# Patient Record
Sex: Male | Born: 1945 | Race: White | Hispanic: No | Marital: Married | Smoking: Former smoker
Health system: Southern US, Community
[De-identification: ages and names within clinical notes are randomized; demographics above are authoritative.]

## PROBLEM LIST (undated history)

## (undated) DIAGNOSIS — I4430 Unspecified atrioventricular block: Secondary | ICD-10-CM

## (undated) DIAGNOSIS — Z974 Presence of external hearing-aid: Secondary | ICD-10-CM

## (undated) DIAGNOSIS — M1990 Unspecified osteoarthritis, unspecified site: Secondary | ICD-10-CM

## (undated) DIAGNOSIS — H269 Unspecified cataract: Secondary | ICD-10-CM

## (undated) DIAGNOSIS — R0902 Hypoxemia: Secondary | ICD-10-CM

## (undated) DIAGNOSIS — I509 Heart failure, unspecified: Secondary | ICD-10-CM

## (undated) DIAGNOSIS — M109 Gout, unspecified: Secondary | ICD-10-CM

## (undated) DIAGNOSIS — I495 Sick sinus syndrome: Secondary | ICD-10-CM

## (undated) DIAGNOSIS — T7840XA Allergy, unspecified, initial encounter: Secondary | ICD-10-CM

## (undated) DIAGNOSIS — D689 Coagulation defect, unspecified: Secondary | ICD-10-CM

## (undated) DIAGNOSIS — I480 Paroxysmal atrial fibrillation: Secondary | ICD-10-CM

## (undated) DIAGNOSIS — Z950 Presence of cardiac pacemaker: Secondary | ICD-10-CM

## (undated) DIAGNOSIS — G4730 Sleep apnea, unspecified: Secondary | ICD-10-CM

## (undated) HISTORY — DX: Coagulation defect, unspecified: D68.9

## (undated) HISTORY — PX: SHOULDER ARTHROSCOPY: SHX128

## (undated) HISTORY — DX: Paroxysmal atrial fibrillation: I48.0

## (undated) HISTORY — DX: Unspecified atrioventricular block: I44.30

## (undated) HISTORY — DX: Heart failure, unspecified: I50.9

## (undated) HISTORY — DX: Hypoxemia: R09.02

## (undated) HISTORY — DX: Allergy, unspecified, initial encounter: T78.40XA

## (undated) HISTORY — DX: Unspecified osteoarthritis, unspecified site: M19.90

## (undated) HISTORY — DX: Presence of cardiac pacemaker: Z95.0

## (undated) HISTORY — PX: INSERT / REPLACE / REMOVE PACEMAKER: SUR710

## (undated) HISTORY — PX: EYE SURGERY: SHX253

## (undated) HISTORY — DX: Unspecified cataract: H26.9

## (undated) HISTORY — DX: Sick sinus syndrome: I49.5

---

## 1994-09-01 HISTORY — PX: ROTATOR CUFF REPAIR: SHX139

## 1994-10-13 HISTORY — PX: CARDIAC CATHETERIZATION: SHX172

## 2002-05-11 ENCOUNTER — Ambulatory Visit (HOSPITAL_COMMUNITY): Admission: RE | Admit: 2002-05-11 | Discharge: 2002-05-11 | Payer: Self-pay | Admitting: Cardiology

## 2002-05-11 ENCOUNTER — Encounter: Payer: Self-pay | Admitting: Cardiology

## 2003-05-04 ENCOUNTER — Encounter: Payer: Self-pay | Admitting: Cardiology

## 2003-05-04 ENCOUNTER — Encounter: Admission: RE | Admit: 2003-05-04 | Discharge: 2003-05-04 | Payer: Self-pay | Admitting: Cardiology

## 2003-05-10 ENCOUNTER — Ambulatory Visit (HOSPITAL_COMMUNITY): Admission: RE | Admit: 2003-05-10 | Discharge: 2003-05-10 | Payer: Self-pay | Admitting: Cardiology

## 2003-05-10 HISTORY — PX: CARDIAC CATHETERIZATION: SHX172

## 2003-06-26 ENCOUNTER — Ambulatory Visit (HOSPITAL_COMMUNITY): Admission: RE | Admit: 2003-06-26 | Discharge: 2003-06-27 | Payer: Self-pay | Admitting: *Deleted

## 2003-06-26 ENCOUNTER — Encounter: Payer: Self-pay | Admitting: *Deleted

## 2008-10-11 ENCOUNTER — Encounter: Admission: RE | Admit: 2008-10-11 | Discharge: 2008-10-11 | Payer: Self-pay | Admitting: Orthopedic Surgery

## 2010-03-12 ENCOUNTER — Ambulatory Visit (HOSPITAL_COMMUNITY): Admission: RE | Admit: 2010-03-12 | Discharge: 2010-03-12 | Payer: Self-pay | Admitting: Cardiology

## 2010-03-12 DIAGNOSIS — Z950 Presence of cardiac pacemaker: Secondary | ICD-10-CM

## 2010-03-12 HISTORY — PX: PERMANENT PACEMAKER INSERTION: SHX6023

## 2010-03-12 HISTORY — DX: Presence of cardiac pacemaker: Z95.0

## 2010-04-08 ENCOUNTER — Encounter (INDEPENDENT_AMBULATORY_CARE_PROVIDER_SITE_OTHER): Payer: Self-pay | Admitting: Urology

## 2010-04-08 ENCOUNTER — Observation Stay (HOSPITAL_COMMUNITY): Admission: RE | Admit: 2010-04-08 | Discharge: 2010-04-09 | Payer: Self-pay | Admitting: Urology

## 2010-11-15 LAB — SURGICAL PCR SCREEN
MRSA, PCR: NEGATIVE
Staphylococcus aureus: NEGATIVE

## 2010-11-15 LAB — BASIC METABOLIC PANEL
BUN: 11 mg/dL (ref 6–23)
CO2: 31 mEq/L (ref 19–32)
Calcium: 9.3 mg/dL (ref 8.4–10.5)
Chloride: 100 mEq/L (ref 96–112)
Creatinine, Ser: 0.87 mg/dL (ref 0.4–1.5)
GFR calc Af Amer: >60 mL/min (ref >60)
GFR calc non Af Amer: >60 mL/min (ref >60)
Glucose, Bld: 102 mg/dL — ABNORMAL HIGH (ref 70–99)
Potassium: 4.2 mEq/L (ref 3.5–5.1)
Sodium: 138 mEq/L (ref 135–145)

## 2010-11-15 LAB — PROTIME-INR
INR: 1.03 (ref 0.00–1.49)
INR: 2.12 — ABNORMAL HIGH (ref 0.00–1.49)
Prothrombin Time: 13.7 seconds (ref 11.6–15.2)
Prothrombin Time: 23.6 seconds — ABNORMAL HIGH (ref 11.6–15.2)

## 2010-11-15 LAB — APTT
aPTT: 26 seconds (ref 24–37)
aPTT: 34 seconds (ref 24–37)

## 2010-11-15 LAB — GLUCOSE, CAPILLARY: Glucose-Capillary: 121 mg/dL — ABNORMAL HIGH (ref 70–99)

## 2010-11-17 LAB — CBC
HCT: 42.1 % (ref 39.0–52.0)
Hemoglobin: 14.6 g/dL (ref 13.0–17.0)
MCH: 32.3 pg (ref 26.0–34.0)
MCHC: 34.6 g/dL (ref 30.0–36.0)
MCV: 93.2 fL (ref 78.0–100.0)
Platelets: 222 10*3/uL (ref 150–400)
RBC: 4.52 MIL/uL (ref 4.22–5.81)
RDW: 13.3 % (ref 11.5–15.5)
WBC: 6.1 10*3/uL (ref 4.0–10.5)

## 2010-11-17 LAB — BASIC METABOLIC PANEL
BUN: 12 mg/dL (ref 6–23)
CO2: 26 mEq/L (ref 19–32)
Calcium: 9 mg/dL (ref 8.4–10.5)
Chloride: 104 mEq/L (ref 96–112)
Creatinine, Ser: 0.83 mg/dL (ref 0.4–1.5)
GFR calc Af Amer: >60 mL/min (ref >60)
GFR calc non Af Amer: >60 mL/min (ref >60)
Glucose, Bld: 100 mg/dL — ABNORMAL HIGH (ref 70–99)
Potassium: 3.9 mEq/L (ref 3.5–5.1)
Sodium: 139 mEq/L (ref 135–145)

## 2010-11-17 LAB — SURGICAL PCR SCREEN
MRSA, PCR: NEGATIVE
Staphylococcus aureus: NEGATIVE

## 2010-11-17 LAB — APTT: aPTT: 28 seconds (ref 24–37)

## 2010-11-17 LAB — PROTIME-INR
INR: 1.49 (ref 0.00–1.49)
Prothrombin Time: 17.9 seconds — ABNORMAL HIGH (ref 11.6–15.2)

## 2010-11-25 LAB — BASIC METABOLIC PANEL
BUN: 11 mg/dL (ref 6–23)
CO2: 32 mEq/L (ref 19–32)
Calcium: 9.5 mg/dL (ref 8.4–10.5)
Chloride: 99 mEq/L (ref 96–112)
Creatinine, Ser: 0.92 mg/dL (ref 0.4–1.5)
GFR calc Af Amer: >60 mL/min (ref >60)
GFR calc non Af Amer: >60 mL/min (ref >60)
Glucose, Bld: 94 mg/dL (ref 70–99)
Potassium: 3.9 mEq/L (ref 3.5–5.1)
Sodium: 138 mEq/L (ref 135–145)

## 2010-11-25 LAB — PROTIME-INR
INR: 1.14 (ref 0.00–1.49)
Prothrombin Time: 14.8 seconds (ref 11.6–15.2)

## 2010-11-28 ENCOUNTER — Ambulatory Visit (HOSPITAL_BASED_OUTPATIENT_CLINIC_OR_DEPARTMENT_OTHER)
Admission: RE | Admit: 2010-11-28 | Discharge: 2010-11-28 | Disposition: A | Discharge status: Home / Self Care | Payer: BC Managed Care – PPO | Source: Ambulatory Visit | Attending: Urology | Admitting: Urology

## 2010-11-28 DIAGNOSIS — Z01812 Encounter for preprocedural laboratory examination: Secondary | ICD-10-CM | POA: Insufficient documentation

## 2010-11-28 DIAGNOSIS — I4891 Unspecified atrial fibrillation: Secondary | ICD-10-CM | POA: Insufficient documentation

## 2010-11-28 DIAGNOSIS — Z7901 Long term (current) use of anticoagulants: Secondary | ICD-10-CM | POA: Insufficient documentation

## 2010-11-28 DIAGNOSIS — N471 Phimosis: Secondary | ICD-10-CM | POA: Insufficient documentation

## 2010-11-28 LAB — POCT HEMOGLOBIN-HEMACUE: Hemoglobin: 14.4 g/dL (ref 13.0–17.0)

## 2010-12-13 NOTE — Op Note (Signed)
  NAME:  Shane [name], Shane [name]               ACCOUNT NO.:  [id number]  MEDICAL RECORD NO.:  [id number]          PATIENT TYPE:  LOCATION:                                 FACILITY:  PHYSICIAN:  Lyle Leisner I. Patsi Sears, M.D. DATE OF BIRTH:  DATE OF PROCEDURE: DATE OF DISCHARGE:                              OPERATIVE REPORT   PREOPERATIVE DIAGNOSIS:  Chronic phimosis.  POSTOPERATIVE DIAGNOSIS:  Chronic phimosis.  OPERATION:  Circumcision.  SURGEON:  Delphina Schum I. Patsi Sears, MD  ANESTHESIA:  General LMA.  PREPARATION:  After appropriate preanesthesia, the patient is brought to the operating room, placed on the operating room in dorsal supine position where general LMA anesthesia was introduced.  He remained in the supine position where the penis was shaved, prepped with Betadine solution, and draped in usual fashion.  BRIEF HISTORY:  The patient is a 65 year old male, with inability to retract the foreskin because of severe phimosis.  Note, he has a past history of A-fib and treated with Coumadin and pacemaker insertion in 2004, and TURP with pathology showing chronic inflammatory process with caseating necrosis, but negative AFB and fungal evaluation.  He is now for circumcision.  PROCEDURE:  Outline of the foreskin was accomplished with a marking pen, and the circumcising incision was made around both the glans of the penis and the corona of the penis.  The foreskin was removed.  It is noted that the patient needed to begin with retrograde incision of the foreskin, because I was unable to retract the foreskin over the glans. There was a large amount of smegma, which required Betadine wash during the procedure.  This was trapped under the foreskin.  Following this, I saw no other evidence of disease, and the foreskin was removed.  The wound was then closed by dividing it into 4 quadrants, and each quadrant closed with interrupted 4-0 Monocryl suture.  The patient tolerated the procedure  well.  A 10 cc of Marcaine 2% was injected into the base of the penis to afford a penile block.  The patient is given IV Toradol prior to awakening.  He was awakened and taken to recovery room in good condition.     Kameryn Tisdel I. Patsi Sears, M.D.    SIT/MEDQ  D:  11/28/2010  T:  11/28/2010  Job:  [id number]  Electronically Signed by Jethro Bolus M.D. on 12/13/2010 05:49:43 PM

## 2011-01-17 NOTE — Discharge Summary (Signed)
NAME:  Shane [name], Shane [name]                         ACCOUNT NO.:  [id number]   MEDICAL RECORD NO.:  [id number]                   PATIENT TYPE:  OIB   LOCATION:  6532                                 FACILITY:  [facility]   PHYSICIAN:  Darlin Priestly, M.D.             DATE OF BIRTH:  [birth year]   DATE OF ADMISSION:  06/26/2003  DATE OF DISCHARGE:  06/27/2003                                 DISCHARGE SUMMARY   ADMISSION DIAGNOSES:  1. 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  No evidence of outflow obstruction, ejection fraction 64%.  2. Mild pulmonary regurgitation and trivial mitral regurgitation.  3. Symptomatic bradycardia.   DISCHARGE DIAGNOSES:  1. 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  No evidence of outflow obstruction, ejection fraction 64%.  2. Mild pulmonary regurgitation and trivial mitral regurgitation.  3. Symptomatic bradycardia.   PROCEDURES:  Implantation of Guidant Insignia 1-Plus DR, model #006, serial  number [id number], June 26, 2003, Dr. Darlin Priestly.   BRIEF HISTORY:  The patient is a 65 year old white male who presented to Dr.  Gaspar Garbe B. [name] with chest discomfort.  He was found to have asymmetrical  septal hypertrophy.  He returned having some occasional shortness of breath  and chest discomfort and underwent cardiac catheterization.  This revealed  no evidence of coronary artery disease but he did have a hyperdynamic left  ventricular function with left ventricular hypertrophy.  There was no  subvalvular gradient noted and no obstruction.  He also underwent 2-D  echocardiogram which revealed mild pulmonary regurgitation and mild mitral  regurgitation.  He was somewhat bradycardic and he was taken off Cardizem  and placed on Norvasc.  He continued to be symptomatic and the Norvasc was  also discontinued.  He remained symptomatic and was referred to Dr. Lenise Herald.   It was his opinion the patient had symptomatic bradycardia off all  his negative chronotropic agents and he recommended permanent transvenous  pacemaker and he was admitted for implantation device at this time.   ALLERGIES:  None known.   MEDICATIONS:  1. He is on an aspirin 81 mg daily.  2. He is on a multivitamin and vitamin E and a baby aspirin daily.  3. He is also taking Allegra and Ativan on an as-needed (p.r.n.) basis.   HOSPITAL COURSE:  The patient was admitted and taken to the catheterization  lab and underwent implantation of the Guidant pacemaker.  He tolerated the  procedure well and returned to the floor.  He has done well.  We were called  last night because the nursing staff noted what they thought was an elevated  heart rate.  Evaluation of the strip shows he is pacing properly and sensing  properly, but the computer was miscounting the heart rate.  The patient has  done well.  He has a chest x-ray  that has been performed this morning.  He  was seen by Dr. Jenne Campus and it was Dr. Mikey Bussing opinion he was doing well  and could be discharged home.  He will return in one week to see Dr.  Jenne Campus.   DISCHARGE MEDICATIONS:  1. He will resume his:     a. Preadmission aspirin 81 mg daily.     b. Vitamins daily.     c. His p.r.n. Allegra and Ativan.  2. He was placed on Cardizem 180 mg daily.   DISCHARGE ACTIVITY:  Discharge activity light, no lifting over 10 pounds, no  lifting overhead.   DIET:  He is to maintain a low-salt, low-cholesterol diet.   FOLLOWUP:  He will follow up with Dr. Jenne Campus on July 06, 2003.      Eber Hong, P.A.                 Darlin Priestly, M.D.    WDJ/MEDQ  D:  06/27/2003  T:  06/27/2003  Job:  [id number]   cc:   Darlin Priestly, M.D.  [address], [address]  [place]  [place] [address]  Fax: [phone]   Thereasa Solo. [name], M.D.  [address]  [place] [address]  Fax: [phone]   Dewaine Oats  [address]  [place]  [place] [address]  Fax: [phone]

## 2011-01-17 NOTE — Op Note (Signed)
NAME:  Shane [name], ZINDA NO.:  [id number]   MEDICAL RECORD NO.:  [id number]                   PATIENT TYPE:  OIB   LOCATION:  6532                                 FACILITY:  [facility]   PHYSICIAN:  Darlin Priestly, M.D.             DATE OF BIRTH:  1946-03-19   DATE OF PROCEDURE:  06/26/2003  DATE OF DISCHARGE:                                 OPERATIVE REPORT   PROCEDURES PERFORMED:  Insertion of a dual chamber pacemaker using a Guidant  Insignia 1+DR with passive atrial and ventricular leads.   ATTENDING:  Darlin Priestly, M.D.   COMPLICATIONS:  None.   INDICATIONS:  Shane [name] is a 65 year old male patient of Shane [name],  M.D. and Shane [name], M.D. in [place] with a history of hypertrophic  obstructive cardiomyopathy, history of significant bradycardia which has  been symptomatic with rates in the 40s despite being off all negative  chronotropic agents.  Because of his need for negative chronotropic agents  to treat his HOCM and his symptomatic bradycardia, he is now referred for  dual chamber pacemaker.   DESCRIPTION OF OPERATION:  After giving informed written consent, patient  brought to the cardiac catheterization laboratory where left chest wall was  shaved, prepped and draped in usual sterile fashion.  ECG monitor  established.  1% lidocaine was then used to anesthetize the left subclavian  region.  Approximately 3 cm horizontal incision was then made beneath the  left clavicle and blunt dissection was then used to carry this into the left  pectoral fascia.  A 3 x 4 cm pocket was then created over the left pectoral  fascia and hemostasis was obtained with electrocautery.  Next, the left  subclavian vein was then entered and a guidewire was then easily passed into  the [facility] and right atrium.  Next, a #9-French dilator and sheath were then  easily passed over the guidewire and the dilator and guide were removed.  Following this a Guidant  passive ventricular lead model #4457 58 cm serial  [id number] was then easily passed into the right atrium and the retained  guidewire was then reinserted through the sheath.  Peel-away sheaths were  removed.  A second 9-French dilator and sheath were then tracked over the  retained guidewire and the guidewire and dilator removed.  A second Guidant  atrial lead model #4480 52 cm passive lead serial #[id number] was then  easily passed into the [facility] and right atrium and the guidewire was again  retained.  Peel-away sheath was removed.  The guidewire was then __________  with a mosquito clamp.  A J curve was then placed on the ventricular stylet.  Ventricular lead was then easily passed across the tricuspid valve.  The  lead was then positioned in the RV apex and thresholds were determined.  R-  waves were measured at 8.8 millivolts.  Threshold in the ventricle was  0.5  volts at 0.5 milliseconds.  Impendence was 412 ohms with a current of 1.6  milliamps.  10 volts was negative for diaphragmatic stimulation.  The atrial  lead was then allowed to form in the right atrial appendage and thresholds  were again determined.  P-waves were sensed at 6.1 millivolts.  A threshold  in the atrium was 0.3 volts at 0.5 milliseconds.  Current was 1.8 milliamps.  Impendence was 378 ohms.  Both these leads were then sutured into place with  two silk sutures per lead.  The pocket was then copiously irrigated with 1%  kanamycin solution.  The leads were then connected in a serial fashion to a  Guidant Insignia 1 plus DR model [id number] serial [id number] and the head screws  were tightened and pacing was confirmed.  A single silk suture was then  placed in the apex of the pocket and __________ and the leads were then  delivered into the pocket without difficulty.  The generator was then  secured with a silk suture.  The subcutaneous layer was then closed using  running 2-0 Dexon.  The skin layer was closed using running  5-0 Dexon.  Steri-Strips were applied.  The patient transferred to recovery room in  stable condition.   CONCLUSION:  Successful placement of a Guidant Insignia 1 plus DR model  [id number] generator with passive atrial and ventricular leads.                                               Darlin Priestly, M.D.    RHM/MEDQ  D:  06/26/2003  T:  06/26/2003  Job:  [id number]   cc:   Shane [name], M.D.  [address]  [place] [address]  Fax: [phone]   Shane [name]  [address]  [place]  [place] [address]  Fax: [phone]

## 2011-01-17 NOTE — Cardiovascular Report (Signed)
NAME:  Shane [name], Shane [name]                         ACCOUNT NO.:  [id number]   MEDICAL RECORD NO.:  [id number]                   PATIENT TYPE:  OIB   LOCATION:  2899                                 FACILITY:  [facility]   PHYSICIAN:  Thereasa Solo. Little, M.D.              DATE OF BIRTH:  20-Aug-1946   DATE OF PROCEDURE:  05/10/2003  DATE OF DISCHARGE:                              CARDIAC CATHETERIZATION   INDICATIONS FOR TEST:  Mr. 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  He has been relatively asymptomatic until about two  months ago when he began having increasing episodes of tiredness and fatigue  and some exertional breathlessness.  His EKG shows more diffuse ST-T wave  abnormalities and mild bradycardia.  His bradycardia was thought to be  related to his Cardizem and he was switched to Norvasc.  He is brought in  for outpatient cardiac catheterization because of the abnormal EKG and  breathlessness.   PROCEDURE:  The patient was prepped and draped in the usual sterile fashion  exposing the right groin.  Following local anesthetic with 1% Xylocaine the  Seldinger technique was employed and a 5-French introducer sheath was placed  into the right femoral artery.  Left and right coronary arteriography was  performed and ventriculography in the RAO and LAO projection was performed.   The right coronary catheter was placed at the apex of the right ventricle  and hemodynamic monitoring was performed.  It was then pulled back into the  subvalvular area and then across the valve.  There was no gradient from the  apex to the subvalvular to the ascending aorta.   RESULTS:  HEMODYNAMIC MONITORING:  Central aortic pressure was 120/56.  Left ventricular pressure was 120/13.   VENTRICULOGRAPHY:  Ventriculography in both the RAO and LAO projection  revealed hyperdynamic LV function.  There was a large papillary muscle in  the mid portion of the left ventricular cavity clearly  constricted more than  the other segments but there was not obliteration of the cavity.  Ejection  fraction was greater than 70%.  No mitral regurgitation was seen.  End-  diastolic pressure was 15.   CORONARY ARTERIOGRAPHY:  On fluoroscopy no calcification was seen.  1. Left main was short, almost like a common ostium for both the LAD and     circumflex.  2. LAD extended down to the apex and gave rise to two high diagonal     branches.  This system was free of disease.  3. Circumflex had one large high first OM and the second OM was a little bit     smaller, but still medium in size.  The ongoing circumflex and the OM     vessels were free of disease.  4. Right coronary artery gave rise to a small PDA and a small posterolateral     branch, all of which were free  of disease.   CONCLUSION:  1. No evidence of coronary artery disease.  2.     Hyperdynamic left ventricular function with left ventricular hypertrophy.     There is no subvalvular gradient noted.   DISPOSITION:  The patient will be discharged to home with follow-up in my  office on May 11, 2003.                                               Thereasa Solo. Little, M.D.    ABL/MEDQ  D:  05/10/2003  T:  05/10/2003  Job:  [id number]   cc:   Arlana Pouch, M.D.  [place], [place]   Cath Lab

## 2011-05-07 HISTORY — PX: US ECHOCARDIOGRAPHY: HXRAD669

## 2011-05-07 HISTORY — PX: NM MYOCAR PERF WALL MOTION: HXRAD629

## 2013-01-13 ENCOUNTER — Telehealth: Payer: Self-pay | Admitting: Cardiovascular Disease

## 2013-01-17 NOTE — Telephone Encounter (Signed)
Shane [name] was returning your call from 01/13/13. Patient states he no longer needs an appointment. He is feeling much better.

## 2013-02-18 ENCOUNTER — Other Ambulatory Visit: Payer: Self-pay | Admitting: Pharmacist Clinician (PhC)/ Clinical Pharmacy Specialist

## 2013-02-18 MED ORDER — FUROSEMIDE 40 MG PO TABS: 40.0000 mg | ORAL_TABLET | Freq: Every day | ORAL | Status: DC

## 2013-03-17 ENCOUNTER — Other Ambulatory Visit: Payer: Self-pay

## 2013-03-17 MED ORDER — FUROSEMIDE 40 MG PO TABS: 40.0000 mg | ORAL_TABLET | Freq: Every day | ORAL | Status: DC

## 2013-03-17 NOTE — Addendum Note (Signed)
Addended by: Neta Ehlers on: 03/17/2013 05:33 PM   Modules accepted: Orders

## 2013-03-17 NOTE — Telephone Encounter (Signed)
Rx was sent to pharmacy electronically. 

## 2013-03-22 ENCOUNTER — Encounter: Payer: Self-pay | Admitting: *Deleted

## 2013-03-31 ENCOUNTER — Encounter: Payer: Self-pay | Admitting: *Deleted

## 2013-04-01 ENCOUNTER — Other Ambulatory Visit: Payer: Self-pay | Admitting: *Deleted

## 2013-04-01 MED ORDER — APIXABAN 5 MG PO TABS: 5.0000 mg | ORAL_TABLET | Freq: Two times a day (BID) | ORAL | Status: DC

## 2013-04-04 ENCOUNTER — Encounter: Payer: Self-pay | Admitting: Cardiovascular Disease

## 2013-04-05 ENCOUNTER — Encounter: Payer: Self-pay | Admitting: Cardiovascular Disease

## 2013-04-05 ENCOUNTER — Ambulatory Visit (INDEPENDENT_AMBULATORY_CARE_PROVIDER_SITE_OTHER): Payer: BC Managed Care – PPO | Admitting: Cardiovascular Disease

## 2013-04-05 VITALS — BP 136/78 | HR 60 | Resp 16 | Ht 71.0 in | Wt 284.6 lb

## 2013-04-05 DIAGNOSIS — I5032 Chronic diastolic (congestive) heart failure: Secondary | ICD-10-CM

## 2013-04-05 DIAGNOSIS — I4819 Other persistent atrial fibrillation: Secondary | ICD-10-CM | POA: Insufficient documentation

## 2013-04-05 DIAGNOSIS — E669 Obesity, unspecified: Secondary | ICD-10-CM

## 2013-04-05 DIAGNOSIS — I4891 Unspecified atrial fibrillation: Secondary | ICD-10-CM | POA: Insufficient documentation

## 2013-04-05 DIAGNOSIS — I5042 Chronic combined systolic (congestive) and diastolic (congestive) heart failure: Secondary | ICD-10-CM | POA: Insufficient documentation

## 2013-04-05 DIAGNOSIS — I509 Heart failure, unspecified: Secondary | ICD-10-CM

## 2013-04-05 DIAGNOSIS — Z950 Presence of cardiac pacemaker: Secondary | ICD-10-CM | POA: Insufficient documentation

## 2013-04-05 NOTE — Assessment & Plan Note (Signed)
Episodes of atrial fibrillation are completely asymptomatic and well rate controlled. He is on appropriate anticoagulation therapy. We discussed appropriate protocols for anticoagulant interruption for surgical procedures. He has not had any bleeding complications. Overall burden is about 3% which is comparable to historical control.

## 2013-04-05 NOTE — Assessment & Plan Note (Signed)
[employer] Altrua 40, current generator implanted July 2011, initial device implant October 2004. Normal device function. Underlying rhythm is sinus bradycardia at 45 beats per minute. Presenting rhythm is atrial ventricular sequential pacing. Battery voltage shows approximate 75% longevity, greater than 5 years. Atrial lead impedance 490 ohms, P waves 3.6 mV, threshold 0.5 V at 0.4 ms. Right ventricular lead impedance 520 ohms, R waves 10.5 mV, threshold 0.7 V at 0.4 ms. 70% atrial pacing, 75% ventricular pacing. No device setting changes were made. Thresholds were actively tested. Oral atrial fibrillation burden is 3%, greater than 900 mode switches since February.

## 2013-04-05 NOTE — Assessment & Plan Note (Signed)
No complaints of shortness of breath or lower showed edema. Appears to currently be euvolemic on a low dose of daily loop diuretic. Left ventricular ejection fraction was 55-60% by echocardiogram performed in September of 2012. Lexiscan Myoview performed at the same time was also normal.

## 2013-04-05 NOTE — Progress Notes (Signed)
Patient ID: Shane [name], male   DOB: 07-10-1946, 67 y.o.   MRN: [id number]     Reason for office visit Atrial fibrillation, sinus dysfunction, pacemaker followup  Since his last appointment Shane [name] has not had any problems with palpitations, syncope, dizziness or lightheadedness, shortness of breath or chest pain. Interrogation of his pacemaker shows that he has had numerous episodes of atrial fibrillation, but he is completely unaware of these. He has not had any bleeding problems, even though he had skin biopsies performed without interruption of chronic anticoagulation with Eliquis. He remains morbidly obese and has been virtually no progress with weight loss.  No Known Allergies  Current Outpatient Prescriptions  Medication Sig Dispense Refill  . apixaban (ELIQUIS) 5 MG TABS tablet Take 1 tablet (5 mg total) by mouth 2 (two) times daily.  60 tablet  6  . Ascorbic Acid (VITAMIN C) 1000 MG tablet Take 1,000 mg by mouth daily.      . B Complex-C-Folic Acid (STRESS FORMULA) TABS Take by mouth daily.      . Calcium Citrate (CITRACAL PO) Take by mouth daily.      . citalopram (CELEXA) 40 MG tablet Take 40 mg by mouth daily.      Marland Kitchen diltiazem (TIAZAC) 360 MG 24 hr capsule Take 360 mg by mouth daily.      Marland Kitchen doxazosin (CARDURA) 4 MG tablet Take 4 mg by mouth at bedtime.      . fish oil-omega-3 fatty acids 1000 MG capsule Take 2 g by mouth daily.      . furosemide (LASIX) 40 MG tablet Take 1 tablet (40 mg total) by mouth daily.  30 tablet  7  . glucosamine-chondroitin 500-400 MG tablet Take 1 tablet by mouth daily.      . montelukast (SINGULAIR) 10 MG tablet Take 10 mg by mouth at bedtime.      Marland Kitchen testosterone (ANDROGEL) 50 MG/5GM GEL Place 5 g onto the skin daily.      . vitamin B-12 (CYANOCOBALAMIN) 1000 MCG tablet Take 1,000 mcg by mouth daily.       No current facility-administered medications for this visit.    Past Medical History  Diagnosis Date  . Sinus node dysfunction   .  Presence of permanent cardiac pacemaker 03/12/10    guidant  . Paroxysmal atrial fibrillation   . Atrioventricular block   . Degenerative joint disease     Past Surgical History  Procedure Laterality Date  . Rotator cuff repair  1996  . Permanent pacemaker insertion  03/12/2010    guidant  . Cardiac catheterization  10/13/1994    No CAD  . Cardiac catheterization  05/10/2003    No CAD  . US echocardiography  05/07/2011    mod. LVH,LA mod. dilated,borderline aortic root dilatation  . Nm myocar perf wall motion  05/07/2011    Lexiscan: No ishcemia    Family History  Problem Relation Age of Onset  . Stroke Mother   . Heart failure Mother     History   Social History  . Marital Status: Married    Spouse Name: N/A    Number of Children: N/A  . Years of Education: N/A   Occupational History  . Not on file.   Social History Main Topics  . Smoking status: Former Smoker    Types: Cigars  . Smokeless tobacco: Not on file  . Alcohol Use: Yes     Comment: social  . Drug Use: No  .  Sexually Active: Not on file   Other Topics Concern  . Not on file   Social History Narrative  . No narrative on file    Review of systems: The patient specifically denies any chest pain at rest or with exertion, dyspnea at rest or with exertion, orthopnea, paroxysmal nocturnal dyspnea, syncope, palpitations, focal neurological deficits, intermittent claudication, lower extremity edema, unexplained weight gain, cough, hemoptysis or wheezing.  The patient also denies abdominal pain, nausea, vomiting, dysphagia, diarrhea, constipation, polyuria, polydipsia, dysuria, hematuria, frequency, urgency, abnormal bleeding or bruising, fever, chills, unexpected weight changes, mood swings, change in skin or hair texture, change in voice quality, auditory or visual problems, allergic reactions or rashes, new musculoskeletal complaints other than usual "aches and pains".   PHYSICAL EXAM BP 136/78  Pulse 60   Resp 16  Ht 5\' 11"  (1.803 m)  Wt 284 lb 9.6 oz (129.094 kg)  BMI 39.71 kg/m2  General: Alert, oriented x3, no distress, severely obese Head: no evidence of trauma, PERRL, EOMI, no exophtalmos or lid lag, no myxedema, no xanthelasma; normal ears, nose and oropharynx Neck: normal jugular venous pulsations and no hepatojugular reflux; brisk carotid pulses without delay and no carotid bruits Chest: clear to auscultation, no signs of consolidation by percussion or palpation, normal fremitus, symmetrical and full respiratory excursions, healthy left subclavian pacemaker site Cardiovascular: normal position and quality of the apical impulse, regular rhythm, normal first and second heart sounds, no murmurs, rubs or gallops Abdomen: no tenderness or distention, no masses by palpation, no abnormal pulsatility or arterial bruits, normal bowel sounds, no hepatosplenomegaly Extremities: no clubbing, cyanosis or edema; 2+ radial, ulnar and brachial pulses bilaterally; 2+ right femoral, posterior tibial and dorsalis pedis pulses; 2+ left femoral, posterior tibial and dorsalis pedis pulses; no subclavian or femoral bruits Neurological: grossly nonfocal   EKG: Atrial paced, ventricular sensed, nonspecific intraventricular conduction delay  Lipid Panel  No results found for this basename: chol, trig, hdl, cholhdl, vldl, ldlcalc    BMET    Component Value Date/Time   NA 138 11/25/2010 1703   K 3.9 11/25/2010 1703   CL 99 11/25/2010 1703   CO2 32 11/25/2010 1703   GLUCOSE 94 11/25/2010 1703   BUN 11 11/25/2010 1703   CREATININE 0.92 11/25/2010 1703   CALCIUM 9.5 11/25/2010 1703   GFRNONAA >60 11/25/2010 1703   GFRAA  Value: >60        The eGFR has been calculated using the MDRD equation. This calculation has not been validated in all clinical situations. eGFR's persistently <60 mL/min signify possible Chronic Kidney Disease. 11/25/2010 1703     ASSESSMENT AND PLAN Atrial fibrillation Episodes of atrial  fibrillation are completely asymptomatic and well rate controlled. He is on appropriate anticoagulation therapy. We discussed appropriate protocols for anticoagulant interruption for surgical procedures. He has not had any bleeding complications. Overall burden is about 3% which is comparable to historical control.  Chronic diastolic CHF (congestive heart failure) No complaints of shortness of breath or lower showed edema. Appears to currently be euvolemic on a low dose of daily loop diuretic. Left ventricular ejection fraction was 55-60% by echocardiogram performed in September of 2012. Lexiscan Myoview performed at the same time was also normal.  Obesity (BMI 35.0-39.9 without comorbidity) Unfortunately he has not made any progress with weight loss and continues to be almost morbidly obese  Pacemaker for sinus node dysfunction [employer] 40, current generator implanted July 2011, initial device implant October 2004. Normal device function. Underlying  rhythm is sinus bradycardia at 45 beats per minute. Presenting rhythm is atrial ventricular sequential pacing. Battery voltage shows approximate 75% longevity, greater than 5 years. Atrial lead impedance 490 ohms, P waves 3.6 mV, threshold 0.5 V at 0.4 ms. Right ventricular lead impedance 520 ohms, R waves 10.5 mV, threshold 0.7 V at 0.4 ms. 70% atrial pacing, 75% ventricular pacing. No device setting changes were made. Thresholds were actively tested. Oral atrial fibrillation burden is 3%, greater than 900 mode switches since February.  Orders Placed This Encounter  Procedures  . EKG 12-Lead   Meds ordered this encounter  Medications  . testosterone (ANDROGEL) 50 MG/5GM GEL    Sig: Place 5 g onto the skin daily.    Junious Silk, MD, [facility] [phone] office [phone] pager

## 2013-04-05 NOTE — Patient Instructions (Addendum)
Your physician recommends that you schedule a follow-up appointment in: 3 months for pacemaker check

## 2013-04-05 NOTE — Assessment & Plan Note (Signed)
Unfortunately he has not made any progress with weight loss and continues to be almost morbidly obese

## 2013-06-01 ENCOUNTER — Other Ambulatory Visit: Payer: Self-pay | Admitting: *Deleted

## 2013-06-01 MED ORDER — DILTIAZEM HCL ER BEADS 360 MG PO CP24: 360.0000 mg | ORAL_CAPSULE | Freq: Every day | ORAL | Status: DC

## 2013-07-11 ENCOUNTER — Other Ambulatory Visit: Payer: Self-pay

## 2013-07-11 MED ORDER — CITALOPRAM HYDROBROMIDE 40 MG PO TABS: ORAL_TABLET | ORAL | Status: DC

## 2013-07-11 NOTE — Telephone Encounter (Signed)
Rx sent in to pharmacy electronically. Authorized #[id number] w/ 0 refills and note to pharmacy - defer to PCP for future refills. Per Dr Royann Shivers.

## 2013-07-11 NOTE — Addendum Note (Signed)
Addended by: Neta Ehlers on: 07/11/2013 03:56 PM   Modules accepted: Orders

## 2013-07-11 NOTE — Telephone Encounter (Signed)
Please advise 

## 2013-07-11 NOTE — Telephone Encounter (Signed)
Please refer to PCP. OK to fill for 30 days only if he has trouble getting through to PCP>

## 2013-08-30 ENCOUNTER — Ambulatory Visit (INDEPENDENT_AMBULATORY_CARE_PROVIDER_SITE_OTHER): Payer: BC Managed Care – PPO | Admitting: *Deleted

## 2013-08-30 DIAGNOSIS — I4891 Unspecified atrial fibrillation: Secondary | ICD-10-CM

## 2013-08-30 LAB — PACEMAKER DEVICE OBSERVATION

## 2013-08-30 NOTE — Patient Instructions (Signed)
Your physician recommends that you schedule a follow-up appointment on 11-28-2013  @ 11:00 with the device clinic on [address]

## 2013-09-07 LAB — MDC_IDC_ENUM_SESS_TYPE_INCLINIC
Brady Statistic RA Percent Paced: 81 %
Brady Statistic RV Percent Paced: 78 %
Date Time Interrogation Session: 20141230050000
Implantable Pulse Generator Serial Number: 598266
Lead Channel Impedance Value: 490 Ohm
Lead Channel Impedance Value: 520 Ohm
Lead Channel Pacing Threshold Amplitude: 0.5 V
Lead Channel Pacing Threshold Amplitude: 0.8 V
Lead Channel Pacing Threshold Pulse Width: 0.4 ms
Lead Channel Pacing Threshold Pulse Width: 0.4 ms
Lead Channel Sensing Intrinsic Amplitude: 10.9 mV
Lead Channel Sensing Intrinsic Amplitude: 3.1 mV
Lead Channel Setting Pacing Amplitude: 2 V
Lead Channel Setting Pacing Amplitude: 2.4 V
Lead Channel Setting Pacing Pulse Width: 0.4 ms
Lead Channel Setting Sensing Sensitivity: 2.5 mV

## 2013-09-07 NOTE — Progress Notes (Signed)
Pacemaker check in clinic. Normal device function. Thresholds, sensing, impedances consistent with previous measurements. Device programmed to maximize longevity. 10 mode switches---max dur. 6hrs 35 mins---AF + Eliquis. No high ventricular rates noted. Device programmed at appropriate safety margins. Histogram distribution appropriate for patient activity level. Device programmed to optimize intrinsic conduction. Estimated longevity 5 years. Patient will follow up with the device clinic in 3 months.

## 2013-11-28 ENCOUNTER — Ambulatory Visit (INDEPENDENT_AMBULATORY_CARE_PROVIDER_SITE_OTHER): Payer: BC Managed Care – PPO | Admitting: *Deleted

## 2013-11-28 ENCOUNTER — Encounter: Payer: Self-pay | Admitting: Cardiovascular Disease

## 2013-11-28 ENCOUNTER — Other Ambulatory Visit: Payer: Self-pay | Admitting: Pharmacist Clinician (PhC)/ Clinical Pharmacy Specialist

## 2013-11-28 DIAGNOSIS — Z950 Presence of cardiac pacemaker: Secondary | ICD-10-CM

## 2013-11-28 DIAGNOSIS — I4891 Unspecified atrial fibrillation: Secondary | ICD-10-CM

## 2013-11-28 LAB — MDC_IDC_ENUM_SESS_TYPE_INCLINIC
Brady Statistic RA Percent Paced: 78 %
Brady Statistic RV Percent Paced: 76 %
Date Time Interrogation Session: 20150330040000
Implantable Pulse Generator Serial Number: 598266
Lead Channel Pacing Threshold Amplitude: 0.5 V
Lead Channel Pacing Threshold Pulse Width: 0.4 ms
Lead Channel Sensing Intrinsic Amplitude: 4.9 mV
Lead Channel Sensing Intrinsic Amplitude: 9.9 mV
Lead Channel Setting Pacing Amplitude: 2.4 V
Lead Channel Setting Pacing Pulse Width: 0.4 ms
MDC IDC MSMT LEADCHNL RA IMPEDANCE VALUE: 520 Ohm
MDC IDC MSMT LEADCHNL RV IMPEDANCE VALUE: 490 Ohm
MDC IDC MSMT LEADCHNL RV PACING THRESHOLD AMPLITUDE: 0.9 V
MDC IDC MSMT LEADCHNL RV PACING THRESHOLD PULSEWIDTH: 0.4 ms
MDC IDC SET LEADCHNL RA PACING AMPLITUDE: 2 V
MDC IDC SET LEADCHNL RV SENSING SENSITIVITY: 2.5 mV

## 2013-11-28 MED ORDER — APIXABAN 5 MG PO TABS: 5.0000 mg | ORAL_TABLET | Freq: Two times a day (BID) | ORAL | Status: DC

## 2013-11-28 NOTE — Progress Notes (Signed)
Pacemaker check in clinic. Normal device function. Thresholds, sensing, impedances consistent with previous measurements. Device programmed to maximize longevity. 49 mode switches---0% of time + eliquis. Device programmed at appropriate safety margins. Histogram distribution appropriate for patient activity level. Device programmed to optimize intrinsic conduction. Estimated longevity 4.12yrs. ROV w/ Dr. Sallyanne Kuster 8/15.

## 2013-12-20 ENCOUNTER — Other Ambulatory Visit: Payer: Self-pay

## 2013-12-20 MED ORDER — FUROSEMIDE 40 MG PO TABS: 40.0000 mg | ORAL_TABLET | Freq: Every day | ORAL | Status: DC

## 2013-12-20 NOTE — Telephone Encounter (Signed)
Rx was sent to pharmacy electronically. 

## 2014-02-24 ENCOUNTER — Telehealth: Payer: Self-pay | Admitting: Cardiovascular Disease

## 2014-02-24 NOTE — Telephone Encounter (Signed)
Dr Gwenlyn Found spoke to Dr Hall Busing. Per Dr Gwenlyn Found, need an appointment with Dr Sallyanne Kuster in July instead of August.  Dr Gwenlyn Found informed Pamala Hurry L.to take care of the matter.

## 2014-03-10 ENCOUNTER — Telehealth: Payer: Self-pay | Admitting: Cardiovascular Disease

## 2014-03-10 NOTE — Telephone Encounter (Signed)
Closed encounter °

## 2014-03-28 ENCOUNTER — Ambulatory Visit (INDEPENDENT_AMBULATORY_CARE_PROVIDER_SITE_OTHER): Payer: BC Managed Care – PPO | Admitting: Cardiovascular Disease

## 2014-03-28 ENCOUNTER — Encounter: Payer: Self-pay | Admitting: Cardiovascular Disease

## 2014-03-28 VITALS — BP 110/58 | HR 76 | Resp 20 | Ht 71.0 in | Wt 288.9 lb

## 2014-03-28 DIAGNOSIS — R0683 Snoring: Secondary | ICD-10-CM

## 2014-03-28 DIAGNOSIS — Z950 Presence of cardiac pacemaker: Secondary | ICD-10-CM

## 2014-03-28 DIAGNOSIS — I5033 Acute on chronic diastolic (congestive) heart failure: Secondary | ICD-10-CM

## 2014-03-28 DIAGNOSIS — R0609 Other forms of dyspnea: Secondary | ICD-10-CM

## 2014-03-28 DIAGNOSIS — R0989 Other specified symptoms and signs involving the circulatory and respiratory systems: Secondary | ICD-10-CM

## 2014-03-28 DIAGNOSIS — I509 Heart failure, unspecified: Secondary | ICD-10-CM

## 2014-03-28 DIAGNOSIS — I4891 Unspecified atrial fibrillation: Secondary | ICD-10-CM

## 2014-03-28 DIAGNOSIS — E669 Obesity, unspecified: Secondary | ICD-10-CM

## 2014-03-28 DIAGNOSIS — G4730 Sleep apnea, unspecified: Secondary | ICD-10-CM

## 2014-03-28 DIAGNOSIS — G4733 Obstructive sleep apnea (adult) (pediatric): Secondary | ICD-10-CM

## 2014-03-28 DIAGNOSIS — I4819 Other persistent atrial fibrillation: Secondary | ICD-10-CM

## 2014-03-28 LAB — MDC_IDC_ENUM_SESS_TYPE_INCLINIC
Brady Statistic RA Percent Paced: 38 %
Date Time Interrogation Session: 20150728040000
Implantable Pulse Generator Serial Number: 598266
Lead Channel Impedance Value: 530 Ohm
Lead Channel Pacing Threshold Pulse Width: 0.4 ms
Lead Channel Sensing Intrinsic Amplitude: 4 mV
Lead Channel Setting Sensing Sensitivity: 2.5 mV
MDC IDC MSMT LEADCHNL RA IMPEDANCE VALUE: 490 Ohm
MDC IDC MSMT LEADCHNL RA SENSING INTR AMPL: 1.3 mV
MDC IDC MSMT LEADCHNL RV PACING THRESHOLD AMPLITUDE: 0.8 V
MDC IDC SET LEADCHNL RA PACING AMPLITUDE: 2 V
MDC IDC SET LEADCHNL RV PACING AMPLITUDE: 2.4 V
MDC IDC SET LEADCHNL RV PACING PULSEWIDTH: 0.4 ms
MDC IDC STAT BRADY RV PERCENT PACED: 62 %

## 2014-03-28 MED ORDER — METOPROLOL SUCCINATE ER 25 MG PO TB24: 25.0000 mg | ORAL_TABLET | Freq: Every day | ORAL | Status: DC

## 2014-03-28 NOTE — Patient Instructions (Signed)

## 2014-03-30 DIAGNOSIS — I5033 Acute on chronic diastolic (congestive) heart failure: Secondary | ICD-10-CM | POA: Insufficient documentation

## 2014-03-30 DIAGNOSIS — G4733 Obstructive sleep apnea (adult) (pediatric): Secondary | ICD-10-CM | POA: Insufficient documentation

## 2014-03-30 NOTE — Progress Notes (Signed)
Patient ID: Shane [name], male   DOB: [birth year], 68 y.o.   MRN: [id number]      Reason for office visit Pacemaker followup, atrial fibrillation, sinus node dysfunction  Shane [name] is a morbidly obese 68 year old gentleman with sinus node dysfunction and recurrent paroxysmal atrial fibrillation, status post implantation of a dual-chamber permanent pacemaker. The atrial fibrillation is consistently asymptomatic. Interrogation of his device today shows a substantial increase in the burden of atrial fibrillation. This is at least 21% in prevalence, likely much more since there is atrial under sensing during episodes of atrial fibrillation. He is in atrial fibrillation today with controlled ventricular rate.  His major complaint is persistent fatigue. He takes numerous naps during the day and lacks energy. He also notes dyspnea on exertion. He usually sleeps in a chair. He has gained more than 20 pounds since 2011. In 2008 cannula for sleep apnea that showed evidence of mild apneic events only in the supine position, but he was almost 70 pounds lighter at that time. The patient's wife has reported that he stops breathing at night.  He recently saw Dr. Hall Busing on June 26; an electrocardiogram was performed after which Dr. Hall Busing and Dr. Gwenlyn Found decided to add metoprolol 12.5 mg twice a day. He was already on diltiazem 70 Unfortunately have a record of that electrocardiogram or of that discussion. He feels even more fatigued since this medication was added. The patient feels that the tachycardia at that time was related to the fact that he was in the middle of moving and was very stressed.   No Known Allergies  Current Outpatient Prescriptions  Medication Sig Dispense Refill  . apixaban (ELIQUIS) 5 MG TABS tablet Take 1 tablet (5 mg total) by mouth 2 (two) times daily.  60 tablet  6  . Ascorbic Acid (VITAMIN C) 1000 MG tablet Take 1,000 mg by mouth daily.      . B Complex-C-Folic Acid (STRESS FORMULA) TABS  Take by mouth daily.      . Calcium Citrate (CITRACAL PO) Take by mouth daily.      . CELECOXIB PO Take 260 mg by mouth daily.      . Cholecalciferol (VITAMIN D-3) 1000 UNITS CAPS Take 1 capsule by mouth daily.      . citalopram (CELEXA) 40 MG tablet Take 1 tablet (40 mg total) by mouth daily. MUST RECEIVE FUTURE REFILLS FROM PCP.  30 tablet  0  . diltiazem (TIAZAC) 360 MG 24 hr capsule Take 1 capsule (360 mg total) by mouth daily.  30 capsule  11  . doxazosin (CARDURA) 4 MG tablet Take 4 mg by mouth at bedtime.      . furosemide (LASIX) 40 MG tablet Take 1 tablet (40 mg total) by mouth daily.  30 tablet  4  . glucosamine-chondroitin 500-400 MG tablet Take 1 tablet by mouth 2 (two) times daily.       . indomethacin (INDOCIN) 50 MG capsule Take 50 mg by mouth as needed (Gout).      . montelukast (SINGULAIR) 10 MG tablet Take 10 mg by mouth at bedtime.      . Omega-3 Fatty Acids (FISH OIL) 1200 MG CAPS Take 2,400 mg by mouth 2 (two) times daily.      Marland Kitchen testosterone (ANDROGEL) 50 MG/5GM GEL Place 5 g onto the skin daily.      . vitamin B-12 (CYANOCOBALAMIN) 1000 MCG tablet Take 1,000 mcg by mouth daily.      . metoprolol succinate (TOPROL XL)  25 MG 24 hr tablet Take 1 tablet (25 mg total) by mouth daily.  30 tablet  5   No current facility-administered medications for this visit.    Past Medical History  Diagnosis Date  . Sinus node dysfunction   . Presence of permanent cardiac pacemaker 03/12/10    guidant  . Paroxysmal atrial fibrillation   . Atrioventricular block   . Degenerative joint disease     Past Surgical History  Procedure Laterality Date  . Rotator cuff repair  1996  . Permanent pacemaker insertion  03/12/2010    guidant  . Cardiac catheterization  10/13/1994    No CAD  . Cardiac catheterization  05/10/2003    No CAD  . US echocardiography  05/07/2011    mod. LVH,LA mod. dilated,borderline aortic root dilatation  . Nm myocar perf wall motion  05/07/2011    Lexiscan: No  ishcemia    Family History  Problem Relation Age of Onset  . Stroke Mother   . Heart failure Mother     History   Social History  . Marital Status: Married    Spouse Name: N/A    Number of Children: N/A  . Years of Education: N/A   Occupational History  . Not on file.   Social History Main Topics  . Smoking status: Former Smoker    Types: Cigars  . Smokeless tobacco: Not on file  . Alcohol Use: Yes     Comment: social  . Drug Use: No  . Sexual Activity: Not on file   Other Topics Concern  . Not on file   Social History Narrative  . No narrative on file    Review of systems: Fatigue, exertional dyspnea, lack of energy, constant sleepiness and daytime naps, intermittent bilateral lower extremity edema, occasional orthostatic dizziness.  The patient specifically denies any chest pain at rest or with exertion, orthopnea, paroxysmal nocturnal dyspnea, syncope, palpitations, focal neurological deficits, intermittent claudication,  unexplained weight gain, cough, hemoptysis or wheezing.  The patient also denies abdominal pain, nausea, vomiting, dysphagia, diarrhea, constipation, polyuria, polydipsia, dysuria, hematuria, frequency, urgency, abnormal bleeding or bruising, fever, chills, unexpected weight changes, mood swings, change in skin or hair texture, change in voice quality, auditory or visual problems, allergic reactions or rashes, new musculoskeletal complaints other than usual "aches and pains".   PHYSICAL EXAM BP 110/58  Pulse 76  Resp 20  Ht _0  (1.803 m)  Wt 288 lb 14.4 oz (131.044 kg)  BMI 40.31 kg/m2 General: Alert, oriented x3, no distress, severely obese  Head: no evidence of trauma, PERRL, EOMI, no exophtalmos or lid lag, no myxedema, no xanthelasma; normal ears, nose and oropharynx  Neck: normal jugular venous pulsations and no hepatojugular reflux; brisk carotid pulses without delay and no carotid bruits  Chest: clear to auscultation, no signs of  consolidation by percussion or palpation, normal fremitus, symmetrical and full respiratory excursions, healthy left subclavian pacemaker site  Cardiovascular: normal position and quality of the apical impulse, regular rhythm, normal first and second heart sounds, no murmurs, rubs or gallops  Abdomen: no tenderness or distention, no masses by palpation, no abnormal pulsatility or arterial bruits, normal bowel sounds, no hepatosplenomegaly  Extremities: no clubbing, cyanosis or edema; 2+ radial, ulnar and brachial pulses bilaterally; 2+ right femoral, posterior tibial and dorsalis pedis pulses; 2+ left femoral, posterior tibial and dorsalis pedis pulses; no subclavian or femoral bruits  Neurological: grossly nonfocal   EKG: Atrial fibrillation, intermittent ventricular pacing  Lipid Panel  No results found for this basename: chol, trig, hdl, cholhdl, vldl, ldlcalc    BMET    Component Value Date/Time   NA 138 11/25/2010 1703   K 3.9 11/25/2010 1703   CL 99 11/25/2010 1703   CO2 32 11/25/2010 1703   GLUCOSE 94 11/25/2010 1703   BUN 11 11/25/2010 1703   CREATININE 0.92 11/25/2010 1703   CALCIUM 9.5 11/25/2010 1703   GFRNONAA >60 11/25/2010 1703   GFRAA  Value: >60        The eGFR has been calculated using the MDRD equation. This calculation has not been validated in all clinical situations. eGFR's persistently <60 mL/min signify possible Chronic Kidney Disease. 11/25/2010 1703     ASSESSMENT AND PLAN Atrial fibrillation  Episodes of atrial fibrillation are completely asymptomatic and well rate controlled. He is on appropriate anticoagulation therapy. The burden of atrial fibrillation is substantially higher than in the past, possibly due to obstructive sleep apnea(21% now,  3% historical trend). In think antiarrhythmic therapy will make him feel any better. I think we have to get to the root cause of the increased burden of arrhythmia, likely obstructive sleep apnea. He prefers the once daily form  of metoprolol.  Acute on chronic diastolic CHF (congestive heart failure)  Worsened shortness of breath and intermittent lower extremity edema. Left ventricular ejection fraction was 55-60% by echocardiogram performed in September of 2012. Lexiscan Myoview performed at the same time was also normal. Today he does not have overt signs of hypervolemia. We'll continue the same dose of diuretic for now.  Obesity (BMI 35.0-39.9 without comorbidity)  Unfortunately he has not made any progress with weight loss, is actually gaining what I believe is real weight and did not check fluid, and continues to be morbidly obese   Pacemaker for sinus node dysfunction  Boeing 40, current generator implanted July 2011, initial device implant October 2004. Normal device function. Underlying rhythm is atrial fibrillation. Battery voltage shows longevity greater than 5 years.  60% atrial pacing, 60% ventricular pacing. The lead sensitivity was adjusted to avoid under sensing of atrial fibrillation. No other device setting changes were made.   Obstructive sleep apnea Very high level of suspicion that this disorder is now full-blown and that he will need CPAP. We'll schedule another formal sleep study  Patient Instructions  Your physician has recommended that you have a sleep study. This test records several body functions during sleep, including: brain activity, eye movement, oxygen and carbon dioxide blood levels, heart rate and rhythm, breathing rate and rhythm, the flow of air through your mouth and nose, snoring, body muscle movements, and chest and belly movement.  Your physician recommends that you schedule a follow-up appointment in: [place]    Orders Placed This Encounter  Procedures  . Implantable device check  . EKG 12-Lead  . Split night study   Meds ordered this encounter  Medications  . CELECOXIB PO    Sig: Take 260 mg by mouth daily.  .  Omega-3 Fatty Acids (FISH OIL) 1200 MG CAPS    Sig: Take 2,400 mg by mouth 2 (two) times daily.  . Cholecalciferol (VITAMIN D-3) 1000 UNITS CAPS    Sig: Take 1 capsule by mouth daily.  Marland Kitchen DISCONTD: metoprolol tartrate (LOPRESSOR) 25 MG tablet    Sig: Take 12.5 mg by mouth 2 (two) times daily.  . metoprolol succinate (TOPROL XL) 25 MG 24 hr tablet    Sig: Take 1  tablet (25 mg total) by mouth daily.    Dispense:  30 tablet    Refill:  Chance Parley Pidcock, MD, [facility] [phone] office [phone] pager

## 2014-05-17 ENCOUNTER — Other Ambulatory Visit: Payer: Self-pay | Admitting: Orthopedic Surgery

## 2014-05-17 DIAGNOSIS — M47812 Spondylosis without myelopathy or radiculopathy, cervical region: Secondary | ICD-10-CM

## 2014-05-19 ENCOUNTER — Ambulatory Visit
Admission: RE | Admit: 2014-05-19 | Discharge: 2014-05-19 | Disposition: A | Discharge status: Home / Self Care | Payer: BC Managed Care – PPO | Source: Ambulatory Visit | Attending: Orthopedic Surgery | Admitting: Orthopedic Surgery

## 2014-05-19 DIAGNOSIS — M47812 Spondylosis without myelopathy or radiculopathy, cervical region: Secondary | ICD-10-CM

## 2014-05-25 ENCOUNTER — Ambulatory Visit (HOSPITAL_BASED_OUTPATIENT_CLINIC_OR_DEPARTMENT_OTHER): Payer: BC Managed Care – PPO | Attending: Cardiovascular Disease

## 2014-05-25 VITALS — Ht 71.0 in | Wt 280.0 lb

## 2014-05-25 DIAGNOSIS — G4730 Sleep apnea, unspecified: Secondary | ICD-10-CM

## 2014-05-25 DIAGNOSIS — G4733 Obstructive sleep apnea (adult) (pediatric): Secondary | ICD-10-CM | POA: Diagnosis not present

## 2014-05-25 DIAGNOSIS — R0683 Snoring: Secondary | ICD-10-CM

## 2014-05-25 DIAGNOSIS — I4819 Other persistent atrial fibrillation: Secondary | ICD-10-CM

## 2014-06-01 ENCOUNTER — Telehealth: Payer: Self-pay | Admitting: *Deleted

## 2014-06-01 NOTE — Telephone Encounter (Signed)
OK to stop Eliquis and resume ASAP signed by Dr. Loletha Grayer and faxed to [facility].

## 2014-06-02 DIAGNOSIS — G4730 Sleep apnea, unspecified: Secondary | ICD-10-CM

## 2014-06-02 NOTE — Sleep Study (Signed)
   NAME: Shane [name] DATE OF BIRTH:  Feb 10, 1946 MEDICAL RECORD NUMBER [id number]  LOCATION: [facility]  PHYSICIAN: Kathee Delton  DATE OF STUDY: 05/25/2014  SLEEP STUDY TYPE: Nocturnal Polysomnogram               REFERRING PHYSICIAN: Croitoru, Mihai, MD  INDICATION FOR STUDY: Hypersomnia with sleep apnea  EPWORTH SLEEPINESS SCORE:  6 HEIGHT: 5\' 11"  (180.3 cm)  WEIGHT: 280 lb (127.007 kg)    Body mass index is 39.07 kg/(m^2).  NECK SIZE: 17 in.  MEDICATIONS: Reviewed in the sleep record  SLEEP ARCHITECTURE: The patient had a total sleep time of 342 minutes, with no slow-wave sleep and decreased quantity of REM. Sleep onset latency was normal, and REM onset was prolonged period sleep efficiency was 85% during the diagnostic portion of the study, and 94% during the titration portion.  RESPIRATORY DATA: The patient underwent a split night protocol where she was found to have 93 obstructive and central events in the first 126 minutes of sleep. This gave her an AHI during the diagnostic portion of the study of 45 events per hour. The events occurred in all body positions, and there was loud snoring noted throughout. The patient was fitted with a Fisher-Paykel large Simplus full face mask, and her pressure was increased in order to control both obstructive events and snoring. She was found to have an optimal CPAP pressure of 13 cm of water.  OXYGEN DATA: There was oxygen desaturation as low as 82% with the patient's obstructive events  CARDIAC DATA: A paced rhythm was noted throughout the night, with occasional PVCs.  MOVEMENT/PARASOMNIA: The patient had large numbers of leg jerks without significant sleep disruption. There were no abnormal behaviors noted.  IMPRESSION/ RECOMMENDATION:    1) split-night study reveals severe obstructive sleep apnea, with an AHI of 45 events per hour and oxygen desaturation as low as 82% during the diagnostic portion of the study.  The patient was then fitted with a large Fisher-Paykel Simplus full face mask, and found to have an optimal CPAP pressure of 13 cm of water. She should also be encouraged to work aggressively on weight loss.  2) paced rhythm noted throughout, with occasional PVCs.    [place], American Board of Sleep Medicine  ELECTRONICALLY SIGNED ON:  06/02/2014, 8:34 AM [place] PH: [phone]   FX: [phone] [place]

## 2014-06-22 ENCOUNTER — Telehealth: Payer: Self-pay | Admitting: *Deleted

## 2014-06-22 ENCOUNTER — Telehealth: Payer: Self-pay | Admitting: Cardiovascular Disease

## 2014-06-22 NOTE — Telephone Encounter (Signed)
Referral sent to [facility] to set up for CPAP therapy. Patient informed of sleep study results and recommendations.

## 2014-06-22 NOTE — Telephone Encounter (Signed)
Study report in chart under notes section dated 06/02/14.

## 2014-06-22 NOTE — Telephone Encounter (Signed)
I see the sleep study documents in there, but not the final report. Can you please track down?

## 2014-06-22 NOTE — Telephone Encounter (Signed)
Thank you Mariann Laster

## 2014-06-22 NOTE — Telephone Encounter (Signed)
Pt called in stating that he had a sleep study done on 9/24 and has not heard anything since then. He would like to know his results. Please call  Thanks

## 2014-06-22 NOTE — Telephone Encounter (Signed)
Message intended to be routed to Dr. Sallyanne Kuster

## 2014-06-22 NOTE — Telephone Encounter (Signed)
Patient notified of sleep study results and recommendations. Referral sent to [facility] for CPAP set up.

## 2014-06-22 NOTE — Telephone Encounter (Signed)
Message routed to Dr. Arnette Norris to advise on sleep study results (in EPIC)

## 2014-06-30 ENCOUNTER — Telehealth: Payer: Self-pay | Admitting: *Deleted

## 2014-06-30 NOTE — Telephone Encounter (Signed)
Cardiac clearance faxed to [facility] - OK to Stop Eliquis 3 days prior to the injection.  Instructed to resume once considered safe.

## 2014-07-05 ENCOUNTER — Other Ambulatory Visit: Payer: Self-pay

## 2014-07-05 MED ORDER — DILTIAZEM HCL ER BEADS 360 MG PO CP24: 360.0000 mg | ORAL_CAPSULE | Freq: Every day | ORAL | Status: DC

## 2014-07-05 NOTE — Telephone Encounter (Signed)
Rx sent to pharmacy   

## 2014-07-06 ENCOUNTER — Other Ambulatory Visit: Payer: Self-pay | Admitting: Pharmacist Clinician (PhC)/ Clinical Pharmacy Specialist

## 2014-07-06 MED ORDER — APIXABAN 5 MG PO TABS: 5.0000 mg | ORAL_TABLET | Freq: Two times a day (BID) | ORAL | Status: DC

## 2014-07-19 ENCOUNTER — Other Ambulatory Visit: Payer: Self-pay | Admitting: *Deleted

## 2014-07-19 MED ORDER — FUROSEMIDE 40 MG PO TABS: 40.0000 mg | ORAL_TABLET | Freq: Every day | ORAL | Status: DC

## 2014-07-19 NOTE — Telephone Encounter (Signed)
Rx refill sent to patient pharmacy   

## 2014-07-25 ENCOUNTER — Encounter: Payer: BC Managed Care – PPO | Admitting: Cardiovascular Disease

## 2014-08-31 ENCOUNTER — Other Ambulatory Visit: Payer: Self-pay

## 2014-09-05 ENCOUNTER — Telehealth: Payer: Self-pay | Admitting: *Deleted

## 2014-09-05 ENCOUNTER — Encounter: Payer: Self-pay | Admitting: Cardiovascular Disease

## 2014-09-05 ENCOUNTER — Ambulatory Visit (INDEPENDENT_AMBULATORY_CARE_PROVIDER_SITE_OTHER): Payer: Medicare Other | Admitting: Cardiovascular Disease

## 2014-09-05 VITALS — BP 102/76 | HR 80 | Ht 71.0 in | Wt 273.6 lb

## 2014-09-05 DIAGNOSIS — Z950 Presence of cardiac pacemaker: Secondary | ICD-10-CM

## 2014-09-05 DIAGNOSIS — I4819 Other persistent atrial fibrillation: Secondary | ICD-10-CM

## 2014-09-05 DIAGNOSIS — I5033 Acute on chronic diastolic (congestive) heart failure: Secondary | ICD-10-CM

## 2014-09-05 DIAGNOSIS — E669 Obesity, unspecified: Secondary | ICD-10-CM

## 2014-09-05 DIAGNOSIS — G4733 Obstructive sleep apnea (adult) (pediatric): Secondary | ICD-10-CM

## 2014-09-05 DIAGNOSIS — Z6835 Body mass index (BMI) 35.0-35.9, adult: Secondary | ICD-10-CM

## 2014-09-05 DIAGNOSIS — I4891 Unspecified atrial fibrillation: Secondary | ICD-10-CM

## 2014-09-05 DIAGNOSIS — I5032 Chronic diastolic (congestive) heart failure: Secondary | ICD-10-CM

## 2014-09-05 LAB — MDC_IDC_ENUM_SESS_TYPE_INCLINIC
Battery Remaining Longevity: 4
Brady Statistic RV Percent Paced: 47 %
Implantable Pulse Generator Serial Number: 598266
Lead Channel Impedance Value: 490 Ohm
Lead Channel Pacing Threshold Amplitude: 0.6 V
Lead Channel Pacing Threshold Pulse Width: 0.4 ms
Lead Channel Sensing Intrinsic Amplitude: 0.5 mV
Lead Channel Sensing Intrinsic Amplitude: 5.4 mV
Lead Channel Setting Pacing Amplitude: 2 V
Lead Channel Setting Sensing Sensitivity: 2.5 mV
MDC IDC MSMT LEADCHNL RV IMPEDANCE VALUE: 540 Ohm
MDC IDC SET LEADCHNL RV PACING AMPLITUDE: 2.4 V
MDC IDC SET LEADCHNL RV PACING PULSEWIDTH: 0.4 ms
MDC IDC STAT BRADY RA PERCENT PACED: 2 %

## 2014-09-05 NOTE — Patient Instructions (Signed)
Dr. Sallyanne Kuster recommends that you schedule a follow-up appointment in: 6 months with pacemaker check.

## 2014-09-05 NOTE — Telephone Encounter (Signed)
Signed clearance faxed OK to Hold Eliquis 2 days prior to injection and restart as soon as possible.

## 2014-09-10 NOTE — Progress Notes (Signed)
Patient ID: Shane [name], male   DOB: [birth year], 69 y.o.   MRN: [id number]      Reason for office visit Pacemaker check  Mr. Sensing feels well and has lost about 15 lb in weight, for which I congratulated him.  He continues to have asymptomatic atrial fibrillation, recorded by his pacemaker. In fact he has had more than 4 months of persistent AF and may have settled into permanent AF. He has 47 % V paced rhythm and no high V rates.  Normal pacemaker function. He complains of fatigue, no change in mild dyspnea, no angina, intermittent ankle swelling.   No Known Allergies  Current Outpatient Prescriptions  Medication Sig Dispense Refill  . apixaban (ELIQUIS) 5 MG TABS tablet Take 1 tablet (5 mg total) by mouth 2 (two) times daily. 60 tablet 6  . Ascorbic Acid (VITAMIN C) 1000 MG tablet Take 1,000 mg by mouth daily.    . B Complex-C-Folic Acid (STRESS FORMULA) TABS Take by mouth daily.    . Calcium Citrate (CITRACAL PO) Take by mouth daily.    . celecoxib (CELEBREX) 200 MG capsule Take 1 capsule by mouth daily.  0  . Cholecalciferol (VITAMIN D-3) 1000 UNITS CAPS Take 1 capsule by mouth daily.    . citalopram (CELEXA) 40 MG tablet Take 1 tablet (40 mg total) by mouth daily. MUST RECEIVE FUTURE REFILLS FROM PCP. 30 tablet 0  . diltiazem (TIAZAC) 360 MG 24 hr capsule Take 1 capsule (360 mg total) by mouth daily. 30 capsule 8  . doxazosin (CARDURA) 4 MG tablet Take 4 mg by mouth at bedtime.    . furosemide (LASIX) 40 MG tablet Take 1 tablet (40 mg total) by mouth daily. 30 tablet 4  . glucosamine-chondroitin 500-400 MG tablet Take 1 tablet by mouth 2 (two) times daily.     Marland Kitchen HYDROcodone-acetaminophen (NORCO/VICODIN) 5-325 MG per tablet as needed. For neck pain.  0  . indomethacin (INDOCIN) 50 MG capsule Take 50 mg by mouth as needed (Gout).    . metoprolol succinate (TOPROL XL) 25 MG 24 hr tablet Take 1 tablet (25 mg total) by mouth daily. 30 tablet 5  . montelukast (SINGULAIR) 10 MG  tablet Take 10 mg by mouth at bedtime.    . Omega-3 Fatty Acids (FISH OIL) 1200 MG CAPS Take 2,400 mg by mouth 2 (two) times daily.    Marland Kitchen testosterone (ANDROGEL) 50 MG/5GM GEL Place 5 g onto the skin daily.    . vitamin B-12 (CYANOCOBALAMIN) 1000 MCG tablet Take 1,000 mcg by mouth daily.     No current facility-administered medications for this visit.    Past Medical History  Diagnosis Date  . Sinus node dysfunction   . Presence of permanent cardiac pacemaker 03/12/10    guidant  . Paroxysmal atrial fibrillation   . Atrioventricular block   . Degenerative joint disease     Past Surgical History  Procedure Laterality Date  . Rotator cuff repair  1996  . Permanent pacemaker insertion  03/12/2010    guidant  . Cardiac catheterization  10/13/1994    No CAD  . Cardiac catheterization  05/10/2003    No CAD  . US echocardiography  05/07/2011    mod. LVH,LA mod. dilated,borderline aortic root dilatation  . Nm myocar perf wall motion  05/07/2011    Lexiscan: No ishcemia    Family History  Problem Relation Age of Onset  . Stroke Mother   . Heart failure Mother  History   Social History  . Marital Status: Married    Spouse Name: N/A    Number of Children: N/A  . Years of Education: N/A   Occupational History  . Not on file.   Social History Main Topics  . Smoking status: Former Smoker    Types: Cigars  . Smokeless tobacco: Not on file  . Alcohol Use: Yes     Comment: social  . Drug Use: No  . Sexual Activity: Not on file   Other Topics Concern  . Not on file   Social History Narrative    Review of systems: The patient specifically denies any chest pain at rest or exertion, dyspnea at rest or with light exertion, orthopnea, paroxysmal nocturnal dyspnea, syncope, palpitations, focal neurological deficits, intermittent claudication, persistent lower extremity edema, unexplained weight gain, cough, hemoptysis or wheezing.   PHYSICAL EXAM BP 102/76 mmHg  Pulse 80  Ht  '5\' 11"'  (1.803 m)  Wt 273 lb 9.6 oz (124.104 kg)  BMI 38.18 kg/m2 General: Alert, oriented x3, no distress, severely obese Head: no evidence of trauma, PERRL, EOMI, no exophtalmos or lid lag, no myxedema, no xanthelasma; normal ears, nose and oropharynx Neck: normal jugular venous pulsations and no hepatojugular reflux; brisk carotid pulses without delay and no carotid bruits Chest: clear to auscultation, no signs of consolidation by percussion or palpation, normal fremitus, symmetrical and full respiratory excursions, healthy left subclavian pacemaker site Cardiovascular: normal position and quality of the apical impulse, irregular rhythm, normal first and second heart sounds, no murmurs, rubs or gallops Abdomen: no tenderness or distention, no masses by palpation, no abnormal pulsatility or arterial bruits, normal bowel sounds, no hepatosplenomegaly Extremities: no clubbing, cyanosis or edema; 2+ radial, ulnar and brachial pulses bilaterally; 2+ right femoral, posterior tibial and dorsalis pedis pulses; 2+ left femoral, posterior tibial and dorsalis pedis pulses; no subclavian or femoral bruits Neurological: grossly nonfocal  EKG: AFib, occ V pacing  Lipid Panel  No results found for: CHOL, TRIG, HDL, CHOLHDL, VLDL, LDLCALC, LDLDIRECT  BMET    Component Value Date/Time   NA 138 11/25/2010 1703   K 3.9 11/25/2010 1703   CL 99 11/25/2010 1703   CO2 32 11/25/2010 1703   GLUCOSE 94 11/25/2010 1703   BUN 11 11/25/2010 1703   CREATININE 0.92 11/25/2010 1703   CALCIUM 9.5 11/25/2010 1703   GFRNONAA >60 11/25/2010 1703   GFRAA  11/25/2010 1703    >60        The eGFR has been calculated using the MDRD equation. This calculation has not been validated in all clinical situations. eGFR's persistently <60 mL/min signify possible Chronic Kidney Disease.     ASSESSMENT AND PLAN  Atrial fibrillation Atrial fibrillation is completely asymptomatic and well rate controlled. He is on  appropriate anticoagulation therapy. We discussed appropriate protocols for anticoagulant interruption for surgical procedures. He has not had any bleeding complications. He may now have permanent AF. Switch to VVIR at next visit if no break in AF.  Chronic diastolic CHF (congestive heart failure) Minimal complaints of shortness of breath or lower extremity edema. Appears to currently be euvolemic on a low dose of daily loop diuretic. Left ventricular ejection fraction was 55-60% by echocardiogram performed in September of 2012. Lexiscan Myoview performed at the same time was also normal.  Obesity (BMI 35.0-39.9 without comorbidity) Congratulated on first ever progress with weight loss, but continues to be severely obese  Pacemaker for sinus node dysfunction Boeing 40, current generator  implanted July 2011, initial device implant October 2004. Normal device function.   Patient Instructions  Dr. Sallyanne Kuster recommends that you schedule a follow-up appointment in: 6 months with pacemaker check.       Orders Placed This Encounter  Procedures  . Implantable device check  . EKG 12-Lead   Meds ordered this encounter  Medications  . celecoxib (CELEBREX) 200 MG capsule    Sig: Take 1 capsule by mouth daily.    Refill:  0  . HYDROcodone-acetaminophen (NORCO/VICODIN) 5-325 MG per tablet    Sig: as needed. For neck pain.    Refill:  0    Terius Jacuinde  Sanda Klein, MD, [facility] [phone] office [phone] pager

## 2014-09-11 ENCOUNTER — Encounter: Payer: Self-pay | Admitting: Cardiovascular Disease

## 2014-09-11 ENCOUNTER — Ambulatory Visit (INDEPENDENT_AMBULATORY_CARE_PROVIDER_SITE_OTHER): Payer: Medicare Other | Admitting: Cardiovascular Disease

## 2014-09-11 VITALS — BP 104/70 | HR 82 | Ht 71.0 in | Wt 290.5 lb

## 2014-09-11 DIAGNOSIS — G4733 Obstructive sleep apnea (adult) (pediatric): Secondary | ICD-10-CM | POA: Diagnosis not present

## 2014-09-11 DIAGNOSIS — I5032 Chronic diastolic (congestive) heart failure: Secondary | ICD-10-CM | POA: Diagnosis not present

## 2014-09-11 DIAGNOSIS — I4819 Other persistent atrial fibrillation: Secondary | ICD-10-CM

## 2014-09-11 DIAGNOSIS — Z950 Presence of cardiac pacemaker: Secondary | ICD-10-CM

## 2014-09-11 DIAGNOSIS — I481 Persistent atrial fibrillation: Secondary | ICD-10-CM

## 2014-09-11 NOTE — Patient Instructions (Addendum)
Your physician recommends that you schedule a follow-up appointment as needed for sleep with Dr. Claiborne Billings.

## 2014-09-11 NOTE — Progress Notes (Signed)
Patient ID: Shane [name], male   DOB: 05/01/1946, 69 y.o.   MRN: [id number]     HPI: Shane [name] is a 69 y.o. male presents to sleep clinic force sleep clinic evaluation following initiation of CPAP therapy.  His primary cardiologist is Dr. Sallyanne Kuster.  Shane [name] has a history of sinus node dysfunction and paroxysmal atrial fibrillation for which he underwent dual-chamber permanent pacemaker implantation in 2011.  He is a former patient of Dr. Rex Kras.  In 2009.  He was referred for sleep study and was not felt to have significant obstructive sleep apnea with AHI of 3.8 per hour.  Recently, he developed increasing symptomatology which led to a repeat evaluation.  There was interpreted by Dr. Normajean Baxter in September 2015.  At that time, his Epworth Sleepiness Scale score was 6.  He was now found to have severe obstructive sleep apnea with an AHI of 45 per hour.  On the baseline portion of the study.  He was titrated up to 13 cm water pressure.  He has been using a full face mask.  He admits to some difficulty with the mask in the lower portion of his head where he may have some pinched nerves.  A download was obtained from 08/05/2014 through 09/03/2014.  He is meeting Medicare compliance standards and had 100% usage days with an average usage of 6 hours and 5 minutes or he is any 13; pressure.  AHI was 2.7.  There was minimal leak.  He has felt improved since initiating CPAP therapy.  He has more energy.  He is unaware of any snoring.  He denies restless legs.  He denies hip neck hallucinations.  An Epworth Sleepiness Scale today was recalculated and this now endorsed at 3 as shown below   Epworth Sleepiness Scale: Situation   Chance of Dozing/Sleeping (0 = never , 1 = slight chance , 2 = moderate chance , 3 = high chance )   sitting and reading 1   watching TV 1   sitting inactive in a public place 0   being a passenger in a motor vehicle for an hour or more 0   lying down in the afternoon 1   sitting and talking to someone 0   sitting quietly after lunch (no alcohol) 0   while stopped for a few minutes in traffic as the driver 0   Total Score  3    Past Medical History  Diagnosis Date  . Sinus node dysfunction   . Presence of permanent cardiac pacemaker 03/12/10    guidant  . Paroxysmal atrial fibrillation   . Atrioventricular block   . Degenerative joint disease     Past Surgical History  Procedure Laterality Date  . Rotator cuff repair  1996  . Permanent pacemaker insertion  03/12/2010    guidant  . Cardiac catheterization  10/13/1994    No CAD  . Cardiac catheterization  05/10/2003    No CAD  . US echocardiography  05/07/2011    mod. LVH,LA mod. dilated,borderline aortic root dilatation  . Nm myocar perf wall motion  05/07/2011    Lexiscan: No ishcemia    No Known Allergies  Current Outpatient Prescriptions  Medication Sig Dispense Refill  . apixaban (ELIQUIS) 5 MG TABS tablet Take 1 tablet (5 mg total) by mouth 2 (two) times daily. 60 tablet 6  . Ascorbic Acid (VITAMIN C) 1000 MG tablet Take 1,000 mg by mouth daily.    . B Complex-C-Folic Acid (STRESS  FORMULA) TABS Take by mouth daily.    . Calcium Citrate (CITRACAL PO) Take by mouth daily.    . celecoxib (CELEBREX) 200 MG capsule Take 1 capsule by mouth daily.  0  . Cholecalciferol (VITAMIN D-3) 1000 UNITS CAPS Take 1 capsule by mouth daily.    . citalopram (CELEXA) 40 MG tablet Take 1 tablet (40 mg total) by mouth daily. MUST RECEIVE FUTURE REFILLS FROM PCP. 30 tablet 0  . diltiazem (TIAZAC) 360 MG 24 hr capsule Take 1 capsule (360 mg total) by mouth daily. 30 capsule 8  . doxazosin (CARDURA) 4 MG tablet Take 4 mg by mouth at bedtime.    . furosemide (LASIX) 40 MG tablet Take 1 tablet (40 mg total) by mouth daily. 30 tablet 4  . glucosamine-chondroitin 500-400 MG tablet Take 1 tablet by mouth 2 (two) times daily.     Marland Kitchen HYDROcodone-acetaminophen (NORCO/VICODIN) 5-325 MG per tablet as needed. For neck pain.  0  .  indomethacin (INDOCIN) 50 MG capsule Take 50 mg by mouth as needed (Gout).    . metoprolol succinate (TOPROL XL) 25 MG 24 hr tablet Take 1 tablet (25 mg total) by mouth daily. 30 tablet 5  . montelukast (SINGULAIR) 10 MG tablet Take 10 mg by mouth at bedtime.    . Omega-3 Fatty Acids (FISH OIL) 1200 MG CAPS Take 2,400 mg by mouth 2 (two) times daily.    Marland Kitchen testosterone (ANDROGEL) 50 MG/5GM GEL Place 5 g onto the skin daily.    . vitamin B-12 (CYANOCOBALAMIN) 1000 MCG tablet Take 1,000 mcg by mouth daily.     No current facility-administered medications for this visit.    History   Social History  . Marital Status: Married    Spouse Name: N/A    Number of Children: N/A  . Years of Education: N/A   Occupational History  . Not on file.   Social History Main Topics  . Smoking status: Former Smoker    Types: Cigars  . Smokeless tobacco: Not on file  . Alcohol Use: Yes     Comment: social  . Drug Use: No  . Sexual Activity: Not on file   Other Topics Concern  . Not on file   Social History Narrative   Socially he is married has one child and 2 grandchildren.  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  Family History  Problem Relation Age of Onset  . Stroke Mother   . Heart failure Mother    His mother died of a stroke.  Father had Alzheimer's disease and died after an episode of pneumonia.  He is one sister age 39 who is alive.   ROS General: Negative; No fevers, chills, or night sweats HEENT: Positive for reduced hearing.  He wears a hearing aid.; No changes in vision , sinus congestion, difficulty swallowing Pulmonary: Negative; No cough, wheezing, shortness of breath, hemoptysis Cardiovascular: Negative; No chest pain, presyncope, syncope, palpatations GI: Negative; No nausea, vomiting, diarrhea, or abdominal pain GU: Negative; No dysuria, hematuria, or difficulty voiding Musculoskeletal: Negative; no  myalgias, joint pain, or weakness Hematologic: Negative; no easy bruising, bleeding Endocrine: Negative; no heat/cold intolerance Neuro: Negative; no changes in balance, headaches Skin: Negative; No rashes or skin lesions Psychiatric: Negative; No behavioral problems, depression Sleep: Negative; No daytime sleepiness, hypersomnolence, bruxism, restless legs, hypnogognic hallucinations, no cataplexy   Physical Exam BP 104/70 mmHg  Pulse  82  Ht 5' 11" (1.803 m)  Wt 290 lb 8 oz (131.77 kg)  BMI 40.53 kg/m2  Body mass index is compatible with morbid obesity General: Alert, oriented, no distress.  Skin: normal turgor, no rashes HEENT: Normocephalic, atraumatic. Pupils round and reactive; sclera anicteric; extraocular muscles intact; Fundi ** Nose without nasal septal hypertrophy Mouth/Parynx benign; Mallinpatti scale 3 Neck: No JVD, no carotid bruits Lungs: clear to ausculatation and percussion; no wheezing or rales  Chest wall: No tenderness to palpation Heart: Irregularly irregular rhythm suggestive of atrial fibrillation, s1 s2 normal; faint 1/6 systolic murmur.  No S3 gallop.  No diastolic murmur rubs thrills or heaves. Abdomen: soft, nontender; no hepatosplenomehaly, BS+; abdominal aorta nontender and not dilated by palpation. Back: No CVA tenderness Pulses 2+ Extremities: no clubbinbg cyanosis or edema, Homan's sign negative  Neurologic: grossly nonfocal; cranial nerves intact. Psychological: Normal affect and mood.    LABS:  BMET    Component Value Date/Time   NA 138 11/25/2010 1703   K 3.9 11/25/2010 1703   CL 99 11/25/2010 1703   CO2 32 11/25/2010 1703   GLUCOSE 94 11/25/2010 1703   BUN 11 11/25/2010 1703   CREATININE 0.92 11/25/2010 1703   CALCIUM 9.5 11/25/2010 1703   GFRNONAA >60 11/25/2010 1703   GFRAA  11/25/2010 1703    >60        The eGFR has been calculated using the MDRD equation. This calculation has not been validated in all  clinical situations. eGFR's persistently <60 mL/min signify possible Chronic Kidney Disease.     Hepatic Function Panel  No results found for: PROT, ALBUMIN, AST, ALT, ALKPHOS, BILITOT, BILIDIR, IBILI   CBC    Component Value Date/Time   WBC 6.1 03/12/2010 0813   RBC 4.52 03/12/2010 0813   HGB 14.4 11/28/2010 0931   HCT 42.1 03/12/2010 0813   PLT 222 03/12/2010 0813   MCV 93.2 03/12/2010 0813   MCH 32.3 03/12/2010 0813   MCHC 34.6 03/12/2010 0813   RDW 13.3 03/12/2010 0813     BNP No results found for: PROBNP  Lipid Panel  No results found for: CHOL, TRIG, HDL, CHOLHDL, VLDL, LDLCALC, LDLDIRECT RADIOLOGY: No results found.    ASSESSMENT AND PLAN: Shane [name] is a 69 year old gentleman who has a history of paroxysmal atrial fibrillation and sinus node dysfunction which resulted in placement of a dual-chamber permanent pacemaker in 2011.  He is on chronic anticoagulation and has been on medical therapy consisting of diltiazem 360 mg and Toprol 25 mg for rate control in addition to Doxacosin 4 mg for his hypertension.  His recent sleep study was positive for severe obstructive sleep apnea and I reviewed this sleep study in detail with him.  At that evaluation, he had oxygen desaturation to 82% on the baseline portion of the study.  His AHI was 45 events per hour.  I reviewed his download.  He is meeting Medicare compliance standards. His current AHI is 2.7 and there is no significant leak.  His mask was readjusted today to alleviate the problem with his back strap impinging on his lower scalp.  He notes improved energy since initiating CPAP.  He is unaware of breakthrough snoring.  His Epworth Sleepiness Scale score are is against hypersomnolence.  He is unaware of any recurrent tachycardia dysrhythmia.  We discussed the importance of weight loss with his body mass index at 40.5, placing him in the morbidly obese category.  His examination suggests continued persistent atrial  fibrillation, which may have settled into permanent atrial fibrillation.  He is  asymptomatic with reference to this on his current rate control medications.  He will return to the cardiology care of Dr. Sallyanne Kuster and primary care of Dr. Benita Stabile.  I will be available on as-needed basis in the future if sleep issues develop.   Time spent: 30 minutes  Troy Sine, MD, [facility]  09/11/2014 6:58 PM

## 2014-09-12 ENCOUNTER — Encounter: Payer: Self-pay | Admitting: Cardiovascular Disease

## 2014-09-13 ENCOUNTER — Other Ambulatory Visit: Payer: Self-pay

## 2014-09-13 MED ORDER — METOPROLOL SUCCINATE ER 25 MG PO TB24: 25.0000 mg | ORAL_TABLET | Freq: Every day | ORAL | Status: DC

## 2014-09-13 NOTE — Telephone Encounter (Signed)
Rx sent to pharmacy   

## 2014-10-09 ENCOUNTER — Other Ambulatory Visit: Payer: Self-pay

## 2014-10-09 NOTE — Telephone Encounter (Signed)
Defer PCP please

## 2014-10-11 ENCOUNTER — Other Ambulatory Visit: Payer: Self-pay

## 2014-10-11 NOTE — Telephone Encounter (Signed)
Refer pcp

## 2014-10-11 NOTE — Telephone Encounter (Signed)
Routed to [facility] to approve.

## 2014-10-13 NOTE — Telephone Encounter (Signed)
PCP?

## 2014-10-31 ENCOUNTER — Encounter: Payer: Self-pay | Admitting: Cardiovascular Disease

## 2014-11-07 ENCOUNTER — Other Ambulatory Visit: Payer: Self-pay

## 2014-11-13 ENCOUNTER — Telehealth: Payer: Self-pay | Admitting: Cardiovascular Disease

## 2014-11-13 NOTE — Telephone Encounter (Signed)
Spoke with pt, aware of dr croitoru's recommendations. Patient voiced understanding of increased bleeding risk.

## 2014-11-13 NOTE — Telephone Encounter (Signed)
The problem with indomethacin is increased risk of stomach bleeding. OK when taken as a brief course for gout, risky taken daily

## 2014-11-13 NOTE — Telephone Encounter (Signed)
Pt called in stating that he has been having some problems with pinched nerves in his neck and he discovered that by taking his Indomethacin (for the gout), it is helping to aid the nerves in his neck. He would like to know if Dr.C is ok with him taking this for this reason. If so he will need this medication called in the [facility] in [place]  Thanks

## 2014-11-13 NOTE — Telephone Encounter (Signed)
Will forward for dr croitoru's review

## 2014-12-04 ENCOUNTER — Other Ambulatory Visit: Payer: Self-pay

## 2014-12-04 NOTE — Telephone Encounter (Signed)
Refer to PCP please

## 2015-01-06 ENCOUNTER — Other Ambulatory Visit: Payer: Self-pay | Admitting: Pharmacist Clinician (PhC)/ Clinical Pharmacy Specialist

## 2015-01-06 MED ORDER — APIXABAN 5 MG PO TABS: 5.0000 mg | ORAL_TABLET | Freq: Two times a day (BID) | ORAL | Status: DC

## 2015-01-18 ENCOUNTER — Other Ambulatory Visit: Payer: Self-pay | Admitting: *Deleted

## 2015-01-18 MED ORDER — FUROSEMIDE 40 MG PO TABS: 40.0000 mg | ORAL_TABLET | Freq: Every day | ORAL | Status: DC

## 2015-03-12 NOTE — Progress Notes (Signed)
Patient ID: Shane [name], male   DOB: Dec 01, 1945, 69 y.o.   MRN: [id number]     Cardiology Office Note   Date:  03/13/2015   [name], DOB December 18, 1945, MRN [id number]  PCP:  Albina Billet, MD  Cardiologist:   Sanda Klein, MD   Chief Complaint  Patient presents with  . 6 MONTHS    Patient has felt light headed, dizzy, and chest pain.      History of Present Illness: Shane [name] is a 69 y.o. male who presents for persistent atrial fibrillation and pacemaker check.  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 The atrial fibrillation is consistently asymptomatic. Interrogation of his device has shown a pattern of gradual and substantial increase in the burden of atrial fibrillation, with well controlled ventricular rate. He seems to now have permanent atrial fibrillation.He receives Eliquis for embolism prevention.  Pacemaker interrogation shows a virtually 100% burden of atrial fibrillation and 53% ventricular pacing. Rate control is fair - rapid rates are mostly sensor driven.  He complains of occasional episodes of lightheadedness,  Usually associated with changes in position and very brief in duration. He thought they might be low blood sugar episodes, but he is not on any hypoglycemic agents. He also describes occasional sensation of tingling and heat traveling up his torso to his face and associated with very mild sweatiness which sounds like a "hot flash".  He is on AndroGel supplements  Past Medical History  Diagnosis Date  . Sinus node dysfunction   . Presence of permanent cardiac pacemaker 03/12/10    guidant  . Paroxysmal atrial fibrillation   . Atrioventricular block   . Degenerative joint disease     Past Surgical History  Procedure Laterality Date  . Rotator cuff repair  1996  . Permanent pacemaker insertion  03/12/2010    guidant  . Cardiac  catheterization  10/13/1994    No CAD  . Cardiac catheterization  05/10/2003    No CAD  . US echocardiography  05/07/2011    mod. LVH,LA mod. dilated,borderline aortic root dilatation  . Nm myocar perf wall motion  05/07/2011    Lexiscan: No ishcemia     Current Outpatient Prescriptions  Medication Sig Dispense Refill  . apixaban (ELIQUIS) 5 MG TABS tablet Take 1 tablet (5 mg total) by mouth 2 (two) times daily. 60 tablet 6  . Ascorbic Acid (VITAMIN C) 1000 MG tablet Take 1,000 mg by mouth daily.    . B Complex-C-Folic Acid (STRESS FORMULA) TABS Take by mouth daily.    . Calcium Citrate (CITRACAL PO) Take by mouth daily.    . celecoxib (CELEBREX) 200 MG capsule Take 1 capsule by mouth daily.  0  . Cholecalciferol (VITAMIN D-3) 1000 UNITS CAPS Take 1 capsule by mouth daily.    . citalopram (CELEXA) 40 MG tablet Take 1 tablet (40 mg total) by mouth daily. MUST RECEIVE FUTURE REFILLS FROM PCP. 30 tablet 0  . diltiazem (TIAZAC) 360 MG 24 hr capsule Take 1 capsule (360 mg total) by mouth daily. 30 capsule 8  . doxazosin (CARDURA) 4 MG tablet Take 4 mg by mouth at bedtime.    . furosemide (LASIX) 40 MG tablet Take 1 tablet (40 mg total) by mouth every other day. 30 tablet 6  . glucosamine-chondroitin 500-400 MG tablet Take 1 tablet by mouth 2 (two) times daily.     Marland Kitchen HYDROcodone-acetaminophen (NORCO/VICODIN) 5-325  MG per tablet as needed. For neck pain.  0  . indomethacin (INDOCIN) 50 MG capsule Take 50 mg by mouth as needed (Gout).    . metoprolol succinate (TOPROL XL) 25 MG 24 hr tablet Take 1 tablet (25 mg total) by mouth daily. 30 tablet 10  . montelukast (SINGULAIR) 10 MG tablet Take 10 mg by mouth at bedtime.    . Omega-3 Fatty Acids (FISH OIL) 1200 MG CAPS Take 2,400 mg by mouth 2 (two) times daily.    Marland Kitchen testosterone (ANDROGEL) 50 MG/5GM GEL Place 5 g onto the skin daily.    . vitamin B-12 (CYANOCOBALAMIN) 1000 MCG tablet Take 1,000 mcg by mouth daily.     No current facility-administered  medications for this visit.    Allergies:   Review of patient's allergies indicates no known allergies.    Social History:  The patient  reports that he has quit smoking. His smoking use included Cigars. He does not have any smokeless tobacco history on file. He reports that he drinks alcohol. He reports that he does not use illicit drugs.   Family History:  The patient's family history includes Heart failure in his mother; Stroke in his mother.    ROS:  Please see the history of present illness.    Otherwise, review of systems positive for none.   All other systems are reviewed and negative.    PHYSICAL EXAM: VS:  BP 104/64 mmHg  Pulse 85  Ht 5\' 11"  (1.803 m)  Wt 263 lb (119.296 kg)  BMI 36.70 kg/m2 , BMI Body mass index is 36.7 kg/(m^2).  General: Alert, oriented x3, no distress, severely obese  Head: no evidence of trauma, PERRL, EOMI, no exophtalmos or lid lag, no myxedema, no xanthelasma; normal ears, nose and oropharynx  Neck: normal jugular venous pulsations and no hepatojugular reflux; brisk carotid pulses without delay and no carotid bruits  Chest: clear to auscultation, no signs of consolidation by percussion or palpation, normal fremitus, symmetrical and full respiratory excursions, healthy left subclavian pacemaker site  Cardiovascular: normal position and quality of the apical impulse, irregular rhythm, normal first and second heart sounds, no murmurs, rubs or gallops  Abdomen: no tenderness or distention, no masses by palpation, no abnormal pulsatility or arterial bruits, normal bowel sounds, no hepatosplenomegaly  Extremities: no clubbing, cyanosis or edema; 2+ radial, ulnar and brachial pulses bilaterally; 2+ right femoral, posterior tibial and dorsalis pedis pulses; 2+ left femoral, posterior tibial and dorsalis pedis pulses; no subclavian or femoral bruits  Neurological: grossly nonfocal Psych: euthymic mood, full affect   EKG:  EKG is ordered today. The  ekg ordered today demonstrates  Atrial fibrillation with intermittent ventricular pacing and fused beats , and complete left bundle branch block   Recent Labs: No results found for requested labs within last 365 days.    Lipid Panel No results found for: CHOL, TRIG, HDL, CHOLHDL, VLDL, LDLCALC, LDLDIRECT    Wt Readings from Last 3 Encounters:  03/13/15 263 lb (119.296 kg)  09/11/14 290 lb 8 oz (131.77 kg)  09/05/14 273 lb 9.6 oz (124.104 kg)      ASSESSMENT AND PLAN:  Atrial fibrillation Atrial fibrillation is asymptomatic and well rate controlled. He is on appropriate anticoagulation therapy. We discussed appropriate protocols for anticoagulant interruption for surgical procedures. He has not had any bleeding complications. He may now have permanent AF. Switch to VVIR.   Chronic diastolic CHF (congestive heart failure) No complaints of shortness of breath or lower extremity  edema. Appears to currently be euvolemic on a low dose of daily loop diuretic. Left ventricular ejection fraction was 55-60% by echocardiogram performed in September of 2012. Lexiscan Myoview performed at the same time was also normal.  Diuretic may be contributing to orthostatic dizziness will cut back to every other day.  If this doesn't help the problem would recommend switching from doxazosin to a more modern alpha-blocker such as Flomax or Uroxatral  Obesity (BMI 35.0-39.9 without comorbidity) Congratulated on progress with weight loss, but continues to be severely obese  Pacemaker for sinus node dysfunction [employer] 40, current generator implanted July 2011, initial device implant October 2004. Normal device function.  switch to VVIR mode today. Estimated generator longevity 3.5 years.  He may be having "hot flashes" related to insufficient testosterone supplementation. Advised him to discuss repeat blood tests with his primary care physician. Can  Consider applying the supplements twice  daily since the episodes mostly happen in the evening    Current medicines are reviewed at length with the patient today.  The patient does not have concerns regarding medicines.  The following changes have been made:   Reduce furosemide to 20 mg every other day.  If dizziness persists, discuss switching to a different alpha-blocker (prescribed by Dr. Gaynelle Arabian)  Labs/ tests ordered today include:   Orders Placed This Encounter  Procedures  . EKG 12-Lead   Patient Instructions  Medication Instructions:   DECREASE FUROSEMIDE TO EVERY OTHER DAY  Follow-Up:  Dr. Sallyanne Kuster recommends that you schedule a follow-up appointment in: [place]           Signed, Denesia Donelan, MD  03/13/2015 9:11 AM    Sanda Klein, MD, [facility] [phone] office [phone] pager

## 2015-03-13 ENCOUNTER — Encounter: Payer: Self-pay | Admitting: Cardiovascular Disease

## 2015-03-13 ENCOUNTER — Ambulatory Visit (INDEPENDENT_AMBULATORY_CARE_PROVIDER_SITE_OTHER): Payer: BLUE CROSS/BLUE SHIELD | Admitting: Cardiovascular Disease

## 2015-03-13 ENCOUNTER — Telehealth: Payer: Self-pay | Admitting: *Deleted

## 2015-03-13 VITALS — BP 104/64 | HR 85 | Ht 71.0 in | Wt 263.0 lb

## 2015-03-13 DIAGNOSIS — I5032 Chronic diastolic (congestive) heart failure: Secondary | ICD-10-CM | POA: Diagnosis not present

## 2015-03-13 DIAGNOSIS — I481 Persistent atrial fibrillation: Secondary | ICD-10-CM | POA: Diagnosis not present

## 2015-03-13 DIAGNOSIS — I4819 Other persistent atrial fibrillation: Secondary | ICD-10-CM

## 2015-03-13 LAB — CUP PACEART INCLINIC DEVICE CHECK
Battery Remaining Longevity: 3.5
Brady Statistic RA Percent Paced: 3 %
Lead Channel Impedance Value: 530 Ohm
Lead Channel Pacing Threshold Amplitude: 0.6 V
Lead Channel Pacing Threshold Amplitude: 1 V
Lead Channel Pacing Threshold Pulse Width: 0.4 ms
Lead Channel Pacing Threshold Pulse Width: 0.4 ms
Lead Channel Sensing Intrinsic Amplitude: 0.9 mV
Lead Channel Sensing Intrinsic Amplitude: 7.8 mV
Lead Channel Setting Pacing Amplitude: 2.4 V
Lead Channel Setting Sensing Sensitivity: 2.5 mV
MDC IDC MSMT LEADCHNL RV IMPEDANCE VALUE: 560 Ohm
MDC IDC SESS DTM: 20160712040000
MDC IDC SET LEADCHNL RV PACING PULSEWIDTH: 0.4 ms
MDC IDC STAT BRADY RV PERCENT PACED: 52 %
Pulse Gen Serial Number: 598266

## 2015-03-13 MED ORDER — FUROSEMIDE 40 MG PO TABS: 40.0000 mg | ORAL_TABLET | ORAL | Status: DC

## 2015-03-13 NOTE — Patient Instructions (Signed)
Medication Instructions:   DECREASE FUROSEMIDE TO EVERY OTHER DAY  Follow-Up:  Dr. Sallyanne Kuster recommends that you schedule a follow-up appointment in: [place]

## 2015-03-13 NOTE — Telephone Encounter (Signed)
Letter composed and mailed to [facility] for a PA for Celebrex.

## 2015-04-12 ENCOUNTER — Telehealth: Payer: Self-pay | Admitting: *Deleted

## 2015-04-12 NOTE — Telephone Encounter (Signed)
Faxed signed order for CPAP supplies to [facility]. 

## 2015-05-02 ENCOUNTER — Other Ambulatory Visit: Payer: Self-pay | Admitting: *Deleted

## 2015-05-02 MED ORDER — DILTIAZEM HCL ER BEADS 360 MG PO CP24: 360.0000 mg | ORAL_CAPSULE | Freq: Every day | ORAL | Status: DC

## 2015-06-11 ENCOUNTER — Other Ambulatory Visit: Payer: Self-pay | Admitting: *Deleted

## 2015-06-13 ENCOUNTER — Encounter: Payer: Self-pay | Admitting: Cardiovascular Disease

## 2015-08-01 ENCOUNTER — Other Ambulatory Visit: Payer: Self-pay | Admitting: Cardiovascular Disease

## 2015-08-01 MED ORDER — CELECOXIB 200 MG PO CAPS: 200.0000 mg | ORAL_CAPSULE | Freq: Every day | ORAL | Status: DC

## 2015-08-13 ENCOUNTER — Other Ambulatory Visit: Payer: Self-pay | Admitting: Cardiovascular Disease

## 2015-08-13 ENCOUNTER — Other Ambulatory Visit: Payer: Self-pay | Admitting: *Deleted

## 2015-08-13 NOTE — Telephone Encounter (Signed)
REFILL 

## 2015-09-18 ENCOUNTER — Other Ambulatory Visit: Payer: Self-pay | Admitting: Cardiovascular Disease

## 2015-09-18 ENCOUNTER — Ambulatory Visit (INDEPENDENT_AMBULATORY_CARE_PROVIDER_SITE_OTHER): Payer: BLUE CROSS/BLUE SHIELD | Admitting: Cardiovascular Disease

## 2015-09-18 ENCOUNTER — Encounter: Payer: Self-pay | Admitting: Cardiovascular Disease

## 2015-09-18 VITALS — BP 97/62 | HR 60 | Ht 71.0 in | Wt 282.4 lb

## 2015-09-18 DIAGNOSIS — I481 Persistent atrial fibrillation: Secondary | ICD-10-CM

## 2015-09-18 DIAGNOSIS — G4733 Obstructive sleep apnea (adult) (pediatric): Secondary | ICD-10-CM

## 2015-09-18 DIAGNOSIS — I952 Hypotension due to drugs: Secondary | ICD-10-CM | POA: Insufficient documentation

## 2015-09-18 DIAGNOSIS — I5032 Chronic diastolic (congestive) heart failure: Secondary | ICD-10-CM

## 2015-09-18 DIAGNOSIS — Z950 Presence of cardiac pacemaker: Secondary | ICD-10-CM

## 2015-09-18 DIAGNOSIS — I951 Orthostatic hypotension: Secondary | ICD-10-CM | POA: Insufficient documentation

## 2015-09-18 DIAGNOSIS — I4819 Other persistent atrial fibrillation: Secondary | ICD-10-CM

## 2015-09-18 MED ORDER — APIXABAN 5 MG PO TABS: ORAL_TABLET | ORAL | Status: DC

## 2015-09-18 MED ORDER — DILTIAZEM HCL ER BEADS 240 MG PO CP24: 240.0000 mg | ORAL_CAPSULE | Freq: Every day | ORAL | Status: DC

## 2015-09-18 NOTE — Patient Instructions (Signed)
Your physician has recommended you make the following change in your medication: DECREASE DILTIAZEM TO 240 MG DAILY  If you need a refill on your cardiac medications before your next appointment, please call your pharmacy.  Dr. Sallyanne Kuster recommends that you schedule a follow-up appointment in: [place] ([place]

## 2015-09-18 NOTE — Progress Notes (Signed)
Patient ID: Shane [name], male   DOB: 05-Jan-1946, 70 y.o.   MRN: [id number]     Cardiology Office Note    Date:  09/18/2015   [name], DOB [birth year], MRN [id number]  PCP:  Albina Billet, MD  Cardiologist:   Sanda Klein, MD   Chief Complaint  Patient presents with  . Follow-up    no chest pain, shortness of breath with exertion, has cramping in legs at night, occassional lightheadedness, no dizziness , no edema    History of Present Illness:  Shane [name] is a 70 y.o. male who presents for persistent atrial fibrillation and pacemaker check.  He has sinus node dysfunction and recurrent atrial fibrillation, status post implantation of a dual-chamber permanent pacemaker [employer], 2011). The atrial fibrillation is consistently asymptomatic. Interrogation of his device has shown a pattern of gradual and substantial increase in the burden of atrial fibrillation, with well controlled ventricular rate. He now probably has permanent atrial fibrillation.He receives Eliquis for embolism prevention. He has not had stroke or other embolic events and denies serious bleeding complications.  Pacemaker interrogation shows a virtually 100% burden of atrial fibrillation and 50% ventricular pacing. Rate control is fair - as before, rapid rates are mostly sensor driven. Estimated generator longevity is another 3.5 years  He actually has less dizziness than he complained of over the summer, but his blood pressure today is quite low. When I rechecked it it was 93/46 mmHg. His weight has changed broadly over the year (January 290 pounds, July 263 pounds, now 282 pounds). The changes appear to be mostly due to a difference to diets and subsequent noncompliance with diet, rather than fluid overload.    Past Medical History  Diagnosis Date  . Sinus node dysfunction (HCC)   . Presence of permanent cardiac pacemaker 03/12/10    guidant  . Paroxysmal atrial fibrillation (HCC)   .  Atrioventricular block   . Degenerative joint disease     Past Surgical History  Procedure Laterality Date  . Rotator cuff repair  1996  . Permanent pacemaker insertion  03/12/2010    guidant  . Cardiac catheterization  10/13/1994    No CAD  . Cardiac catheterization  05/10/2003    No CAD  . US echocardiography  05/07/2011    mod. LVH,LA mod. dilated,borderline aortic root dilatation  . Nm myocar perf wall motion  05/07/2011    Lexiscan: No ishcemia    Outpatient Prescriptions Prior to Visit  Medication Sig Dispense Refill  . Ascorbic Acid (VITAMIN C) 1000 MG tablet Take 1,000 mg by mouth daily.    . B Complex-C-Folic Acid (STRESS FORMULA) TABS Take by mouth daily.    . Calcium Citrate (CITRACAL PO) Take by mouth daily.    . celecoxib (CELEBREX) 200 MG capsule Take 1 capsule (200 mg total) by mouth daily. 30 capsule 2  . Cholecalciferol (VITAMIN D-3) 1000 UNITS CAPS Take 1 capsule by mouth daily.    . citalopram (CELEXA) 40 MG tablet Take 1 tablet (40 mg total) by mouth daily. MUST RECEIVE FUTURE REFILLS FROM PCP. 30 tablet 0  . doxazosin (CARDURA) 4 MG tablet Take 4 mg by mouth at bedtime.    . furosemide (LASIX) 40 MG tablet Take 1 tablet (40 mg total) by mouth every other day. 30 tablet 6  . glucosamine-chondroitin 500-400 MG tablet Take 1 tablet by mouth 2 (two) times daily.     Marland Kitchen HYDROcodone-acetaminophen (NORCO/VICODIN) 5-325 MG per tablet as needed.  For neck pain.  0  . indomethacin (INDOCIN) 50 MG capsule Take 50 mg by mouth as needed (Gout).    . metoprolol succinate (TOPROL-XL) 25 MG 24 hr tablet Take 1 tablet (25 mg total) by mouth daily. 30 tablet 0  . montelukast (SINGULAIR) 10 MG tablet Take 10 mg by mouth at bedtime.    . Omega-3 Fatty Acids (FISH OIL) 1200 MG CAPS Take 2,400 mg by mouth 2 (two) times daily.    Marland Kitchen testosterone (ANDROGEL) 50 MG/5GM GEL Place 5 g onto the skin daily.    . vitamin B-12 (CYANOCOBALAMIN) 1000 MCG tablet Take 1,000 mcg by mouth daily.    Marland Kitchen  diltiazem (TIAZAC) 360 MG 24 hr capsule Take 1 capsule (360 mg total) by mouth daily. 30 capsule 6  . ELIQUIS 5 MG TABS tablet Take 1 tablet (5 mg total) by mouth 2 (two) times daily. 60 tablet 1   No facility-administered medications prior to visit.     Allergies:   Review of patient's allergies indicates no known allergies.   Social History   Social History  . Marital Status: Married    Spouse Name: N/A  . Number of Children: N/A  . Years of Education: N/A   Social History Main Topics  . Smoking status: Former Smoker    Types: Cigars  . Smokeless tobacco: None  . Alcohol Use: Yes     Comment: social  . Drug Use: No  . Sexual Activity: Not Asked   Other Topics Concern  . None   Social History Narrative     Family History:  The patient's family history includes Heart failure in his mother; Stroke in his mother.   ROS:   Please see the history of present illness.    ROS All other systems reviewed and are negative.   PHYSICAL EXAM:   VS:  BP 97/62 mmHg  Pulse 60  Ht 5\' 11"  (1.803 m)  Wt 282 lb 6 oz (128.084 kg)  BMI 39.40 kg/m2   GEN: Well nourished, well developed, in no acute distress HEENT: normal Neck: no JVD, carotid bruits, or masses Cardiac: irregular, no murmurs, rubs, or gallops,no edema, healthy pacemaker site  Respiratory:  clear to auscultation bilaterally, normal work of breathing GI: soft, nontender, nondistended, + BS MS: no deformity or atrophy Skin: warm and dry, no rash Neuro:  Alert and Oriented x 3, Strength and sensation are intact Psych: euthymic mood, full affect  Wt Readings from Last 3 Encounters:  09/18/15 282 lb 6 oz (128.084 kg)  03/13/15 263 lb (119.296 kg)  09/11/14 290 lb 8 oz (131.77 kg)      Studies/Labs Reviewed:   EKG:  EKG is ordered today.  The ekg ordered today demonstrates atrial fibrillation with occasional paced ventricular beats (roughly 50/50 sensed/paced)   ASSESSMENT:    1. Persistent atrial fibrillation  ([place])   2. Pacemaker for sinus node dysfunction   3. Chronic diastolic CHF (congestive heart failure) ([place])   4. Hypotension due to drugs   5. Morbid obesity, unspecified obesity type ([place])   6. OSA (obstructive sleep apnea)      PLAN:  In order of problems listed above:  1. Most likely he is now in permanent atrial fibrillation. He has always appeared to be oblivious to the arrhythmia. Rate control is satisfactory. No bleeding complications on Eliquis. No history of embolic stroke. CHADSVasc score at least 2 (age, heart failure) 2. Normally functioning dual-chamber permanent pacemaker, initially implanted for sinus node  dysfunction. We'll reduce AV nodal blocking agents today. Hopefully this will lead to less ventricular pacing. 3. CHF: Appears to be clinically euvolemic, NYHA functional class I-II. Weight changes appear to be due to real weight rather than edema. Reduction in ventricular pacing is likely to be beneficial in the setting of heart failure. 4. Hypotension: Reduce diltiazem to 240 mg daily. 5. Obesity: Long term, weight reduction would be highly beneficial. He has shown broad swings in weight due to inability to comply with dietary restrictions beyond a few months. 6. OSA: Reports 100% compliance with CPAP    Medication Adjustments/Labs and Tests Ordered: Current medicines are reviewed at length with the patient today.  Concerns regarding medicines are outlined above.  Medication changes, Labs and Tests ordered today are listed in the Patient Instructions below. Patient Instructions  Your physician has recommended you make the following change in your medication: DECREASE DILTIAZEM TO 240 MG DAILY  If you need a refill on your cardiac medications before your next appointment, please call your pharmacy.  Dr. Sallyanne Kuster recommends that you schedule a follow-up appointment in: [place] ([place]).          Mikael Spray, MD    09/18/2015 5:33 PM    [address], [place], [place]  [address] Phone: [phone]; Fax: [phone]

## 2015-09-18 NOTE — Telephone Encounter (Signed)
Rx request sent to pharmacy.  

## 2015-10-02 ENCOUNTER — Telehealth: Payer: Self-pay | Admitting: Cardiovascular Disease

## 2015-10-02 ENCOUNTER — Ambulatory Visit (INDEPENDENT_AMBULATORY_CARE_PROVIDER_SITE_OTHER): Payer: BLUE CROSS/BLUE SHIELD

## 2015-10-02 VITALS — BP 114/70 | HR 82 | Ht 71.0 in | Wt 286.1 lb

## 2015-10-02 DIAGNOSIS — I481 Persistent atrial fibrillation: Secondary | ICD-10-CM

## 2015-10-02 DIAGNOSIS — I4819 Other persistent atrial fibrillation: Secondary | ICD-10-CM

## 2015-10-02 DIAGNOSIS — Z950 Presence of cardiac pacemaker: Secondary | ICD-10-CM

## 2015-10-02 NOTE — Telephone Encounter (Signed)
New Message   Pt c/o BP issue: STAT if pt c/o blurred vision, one-sided weakness or slurred speech  1. What are your last 5 BP readings? 105/72  Hr 163 2. Are you having any other symptoms (ex. Dizziness, headache, blurred vision, passed out)? Dizziness  3. What is your BP issue? PT &HR elevated

## 2015-10-02 NOTE — Telephone Encounter (Signed)
Returned call to patient.He stated he had appointment with [facility] urology this afternoon.Stated B/P was 105/72 pulse 163.Stated B/P and pulse was checked by a machine.Stated he was told to call Dr.Croitoru.Advised he can come to office now for B/P and pulse check.

## 2015-10-02 NOTE — Patient Instructions (Signed)
Continue same medications   Have pulse checked manually.Electronic machines cannot check pulse accurately when you have AFib.

## 2015-10-02 NOTE — Progress Notes (Signed)
1.) Reason for visit: B/P check Pulse check  2.) Name of MD requesting visit: Dr.Hilty  3.) H&P: Patient had appointment with [facility] this afternoon B/P and pulse checked with electronic machine B/P 105/72 pulse 163, was told to call Dr.Croitoru.  4.) ROS related to problem: No complaints  5.) Assessment and plan per MD: B/P at our office 114/70 pulse 82.Continue same medications.

## 2015-10-15 ENCOUNTER — Other Ambulatory Visit: Payer: Self-pay | Admitting: Cardiovascular Disease

## 2015-10-15 NOTE — Telephone Encounter (Signed)
Rx request sent to pharmacy.  

## 2015-10-23 ENCOUNTER — Other Ambulatory Visit: Payer: Self-pay | Admitting: Cardiovascular Disease

## 2015-10-24 ENCOUNTER — Other Ambulatory Visit: Payer: Self-pay | Admitting: *Deleted

## 2015-10-24 MED ORDER — METOPROLOL SUCCINATE ER 25 MG PO TB24: 25.0000 mg | ORAL_TABLET | Freq: Every day | ORAL | Status: DC

## 2015-10-30 ENCOUNTER — Other Ambulatory Visit: Payer: Self-pay | Admitting: Cardiovascular Disease

## 2015-10-30 NOTE — Telephone Encounter (Signed)
Refer PCP please

## 2016-03-12 ENCOUNTER — Other Ambulatory Visit: Payer: Self-pay | Admitting: Cardiovascular Disease

## 2016-03-24 NOTE — Progress Notes (Addendum)
Patient ID: Shane [name], male   DOB: 1946/04/08, 70 y.o.   MRN: [id number]     Cardiology Office Note    Date:  04/01/2016   [name], DOB [birth year], MRN [id number]  PCP:  Albina Billet, MD  Cardiologist:   Sanda Klein, MD   Chief Complaint  Patient presents with  . Follow-up    History of Present Illness:  Shane [name] is a 70 y.o. male who presents for persistent atrial fibrillation and pacemaker check.  He has sinus node dysfunction and Long-term persistent, probably permanent atrial fibrillation, status post implantation of a dual-chamber permanent pacemaker [employer], 2011). The atrial fibrillation is consistently asymptomatic. His device is now programmed VVIR. Interrogation of his device has shown a pattern of gradual and substantial increase in the burden of atrial fibrillation, with well controlled ventricular rate. He now probably has permanent atrial fibrillation. He receives Eliquis for embolism prevention. He has not had stroke or other embolic events and denies serious bleeding complications.  Pacemaker interrogation shows 100% burden of atrial fibrillation and 50% ventricular pacing. Rate control is adequate, although occasional rapid rates are seen. Estimated generator longevity is another 3.0 years.  We tested pacing threshold in the ventricle 0.9 V at 0.4 ms. Sensed R waves 5.8 mV, pacing impedance 540 ohms, similar to historical trend. Activity level constant at approximately 7%  At his last appointment, his blood pressure today was quite low. The dose of diltiazem was reduced. He denies interim columns with dizziness and his blood pressure today is normal. He has rare orthostatic hypotension symptoms. He reports shortness of breath walking up hill, often having to stop when climbing. He denies orthopnea, PND, leg edema. He is aware of the impact his weight has on his exercise capacity. He has "semiretired" and feels less pressure from  work.   Past Medical History:  Diagnosis Date  . Atrioventricular block   . Degenerative joint disease   . Paroxysmal atrial fibrillation (HCC)   . 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 Sinus node dysfunction [facility])     Past Surgical History:  Procedure Laterality Date  . CARDIAC CATHETERIZATION  10/13/1994   No CAD  . CARDIAC CATHETERIZATION  05/10/2003   No CAD  . NM MYOCAR PERF WALL MOTION  05/07/2011   Lexiscan: No ishcemia  . PERMANENT PACEMAKER INSERTION  03/12/2010   guidant  . ROTATOR CUFF REPAIR  1996  . US ECHOCARDIOGRAPHY  05/07/2011   mod. LVH,LA mod. dilated,borderline aortic root dilatation    Outpatient Medications Prior to Visit  Medication Sig Dispense Refill  . Ascorbic Acid (VITAMIN C) 1000 MG tablet Take 1,000 mg by mouth daily.    . B Complex-C-Folic Acid (STRESS FORMULA) TABS Take by mouth daily.    . Calcium Citrate (CITRACAL PO) Take by mouth daily.    . celecoxib (CELEBREX) 200 MG capsule Take 1 capsule (200 mg total) by mouth daily. 30 capsule 6  . Cholecalciferol (VITAMIN D-3) 1000 UNITS CAPS Take 1 capsule by mouth daily.    . citalopram (CELEXA) 40 MG tablet Take 1 tablet (40 mg total) by mouth daily. MUST RECEIVE FUTURE REFILLS FROM PCP. 30 tablet 0  . diltiazem (TIAZAC) 240 MG 24 hr capsule Take 1 capsule (240 mg total) by mouth daily. 30 capsule 6  . doxazosin (CARDURA) 4 MG tablet Take 4 mg by mouth at bedtime.    Marland Kitchen ELIQUIS 5 MG TABS tablet Take 1  tablet (5 mg total) by mouth 2 (two) times daily. 60 tablet 3  . furosemide (LASIX) 40 MG tablet Take 1 tablet (40 mg total) by mouth daily. 30 tablet 3  . glucosamine-chondroitin 500-400 MG tablet Take 1 tablet by mouth 2 (two) times daily.     Marland Kitchen HYDROcodone-acetaminophen (NORCO/VICODIN) 5-325 MG per tablet as needed. For neck pain.  0  . indomethacin (INDOCIN) 50 MG capsule Take 50 mg by mouth as needed (Gout).    . metoprolol succinate (TOPROL-XL) 25 MG 24 hr tablet Take 1 tablet  (25 mg total) by mouth daily. 30 tablet 5  . montelukast (SINGULAIR) 10 MG tablet Take 10 mg by mouth at bedtime.    . Omega-3 Fatty Acids (FISH OIL) 1200 MG CAPS Take 2,400 mg by mouth 2 (two) times daily.    Marland Kitchen testosterone (ANDROGEL) 50 MG/5GM GEL Place 5 g onto the skin daily.    . vitamin B-12 (CYANOCOBALAMIN) 1000 MCG tablet Take 1,000 mcg by mouth daily.     No facility-administered medications prior to visit.      Allergies:   Review of patient's allergies indicates no known allergies.   Social History   Social History  . Marital status: Married    Spouse name: N/A  . Number of children: N/A  . Years of education: N/A   Social History Main Topics  . Smoking status: Former Smoker    Types: Cigars  . Smokeless tobacco: None  . Alcohol use Yes     Comment: social  . Drug use: No  . Sexual activity: Not Asked   Other Topics Concern  . None   Social History Narrative  . None     Family History:  The patient's family history includes Heart failure in his mother; Stroke in his mother.   ROS:   Please see the history of present illness.    ROS All other systems reviewed and are negative.   PHYSICAL EXAM:   VS:  BP 118/71 (BP Location: Left Arm, Patient Position: Sitting, Cuff Size: Normal)   Pulse 78   Ht 5\' 11"  (1.803 m)   Wt 125.6 kg (277 lb)   BMI 38.63 kg/m    GEN: Well nourished, well developed, in no acute distress  HEENT: normal  Neck: no JVD, carotid bruits, or masses Cardiac: irregular, no murmurs, rubs, or gallops,no edema, healthy pacemaker site  Respiratory:  clear to auscultation bilaterally, normal work of breathing GI: soft, nontender, nondistended, + BS MS: no deformity or atrophy  Skin: warm and dry, no rash Neuro:  Alert and Oriented x 3, Strength and sensation are intact Psych: euthymic mood, full affect  Wt Readings from Last 3 Encounters:  04/01/16 125.6 kg (277 lb)  10/02/15 129.8 kg (286 lb 2 oz)  09/18/15 128.1 kg (282 lb 6 oz)       Studies/Labs Reviewed:   EKG:  EKG is ordered today.  The ekg ordered today demonstrates atrial fibrillation with occasional paced ventricular beats (roughly 50/50 sensed/paced)   ASSESSMENT:    1. Chronic atrial fibrillation ([place])   2. Pacemaker for sinus node dysfunction   3. Chronic diastolic CHF (congestive heart failure) ([place])   4. Hypotension due to drugs   5. OSA (obstructive sleep apnea)   6. Morbid obesity, unspecified obesity type ([place])      PLAN:  In order of problems listed above:  1. Most likely he is now in permanent atrial fibrillation. He has always appeared to be oblivious  to the arrhythmia. Rate control is satisfactory. No bleeding complications on Eliquis. No history of embolic stroke. CHADSVasc score at least 2 (age, heart failure) 2. Normally functioning dual-chamber permanent pacemaker, initially implanted for sinus node dysfunction. 50% ventricular pacing. Remote downloads Q 3 months. 3. CHF: Appears to be clinically euvolemic, NYHA functional class 2. Weight changes in past appeared to be due to real weight rather than edema.  4. Hypotension: Reduced diltiazem to 240 mg daily at January visit. 5. OSA: Reports 100% compliance with CPAP 6. Obesity: Long term, weight reduction would be highly beneficial. He has shown broad swings in weight due to inability to comply with dietary restrictions beyond a few months.    Medication Adjustments/Labs and Tests Ordered: Current medicines are reviewed at length with the patient today.  Concerns regarding medicines are outlined above.  Medication changes, Labs and Tests ordered today are listed in the Patient Instructions below. Patient Instructions  Dr Sallyanne Kuster recommends that you continue on your current medications as directed. Please refer to the Current Medication list given to you today.  Dr Sallyanne Kuster recommends that you schedule a follow-up appointment in 6 months with a pacemaker check. You will receive a  reminder letter in the mail two months in advance. If you don't receive a letter, please call our office to schedule the follow-up appointment.  If you need a refill on your cardiac medications before your next appointment, please call your pharmacy.      Signed, Sanda Klein, MD  04/01/2016 2:25 PM    [address], [place], [place]  [address] Phone: [phone]; Fax: [phone]

## 2016-04-01 ENCOUNTER — Ambulatory Visit (INDEPENDENT_AMBULATORY_CARE_PROVIDER_SITE_OTHER): Payer: BLUE CROSS/BLUE SHIELD | Admitting: Cardiovascular Disease

## 2016-04-01 ENCOUNTER — Encounter: Payer: Self-pay | Admitting: Cardiovascular Disease

## 2016-04-01 VITALS — BP 118/71 | HR 78 | Ht 71.0 in | Wt 277.0 lb

## 2016-04-01 DIAGNOSIS — I952 Hypotension due to drugs: Secondary | ICD-10-CM | POA: Diagnosis not present

## 2016-04-01 DIAGNOSIS — I5032 Chronic diastolic (congestive) heart failure: Secondary | ICD-10-CM

## 2016-04-01 DIAGNOSIS — G4733 Obstructive sleep apnea (adult) (pediatric): Secondary | ICD-10-CM

## 2016-04-01 DIAGNOSIS — Z950 Presence of cardiac pacemaker: Secondary | ICD-10-CM

## 2016-04-01 DIAGNOSIS — I482 Chronic atrial fibrillation, unspecified: Secondary | ICD-10-CM

## 2016-04-01 LAB — CUP PACEART INCLINIC DEVICE CHECK
Battery Remaining Longevity: 36 mo
Date Time Interrogation Session: 20170801152521
Implantable Lead Location: 753860
Lead Channel Pacing Threshold Pulse Width: 0.4 ms
MDC IDC LEAD IMPLANT DT: 20041025
MDC IDC LEAD IMPLANT DT: 20041025
MDC IDC LEAD LOCATION: 753859
MDC IDC LEAD MODEL: 4457
MDC IDC LEAD MODEL: 4480
MDC IDC LEAD SERIAL: 338209
MDC IDC LEAD SERIAL: 424134
MDC IDC MSMT LEADCHNL RV PACING THRESHOLD AMPLITUDE: 0.9 V
MDC IDC MSMT LEADCHNL RV SENSING INTR AMPL: 5.8 mV
MDC IDC PG SERIAL: 598266

## 2016-04-01 NOTE — Patient Instructions (Addendum)
Dr Sallyanne Kuster recommends that you continue on your current medications as directed. Please refer to the Current Medication list given to you today.  Dr Sallyanne Kuster recommends that you schedule a follow-up appointment in 6 months with a pacemaker check. You will receive a reminder letter in the mail two months in advance. If you don't receive a letter, please call our office to schedule the follow-up appointment.  If you need a refill on your cardiac medications before your next appointment, please call your pharmacy.

## 2016-04-02 ENCOUNTER — Other Ambulatory Visit: Payer: Self-pay | Admitting: Cardiovascular Disease

## 2016-04-02 NOTE — Telephone Encounter (Signed)
Rx(s) sent to pharmacy electronically.  

## 2016-04-04 ENCOUNTER — Encounter: Payer: Self-pay | Admitting: Cardiovascular Disease

## 2016-05-09 ENCOUNTER — Other Ambulatory Visit: Payer: Self-pay | Admitting: Cardiovascular Disease

## 2016-07-21 ENCOUNTER — Other Ambulatory Visit: Payer: Self-pay | Admitting: Cardiovascular Disease

## 2016-07-29 ENCOUNTER — Other Ambulatory Visit: Payer: Self-pay | Admitting: Cardiovascular Disease

## 2016-08-05 ENCOUNTER — Other Ambulatory Visit: Payer: Self-pay | Admitting: Cardiovascular Disease

## 2016-08-05 NOTE — Telephone Encounter (Signed)
Rx has been sent to the pharmacy electronically. ° °

## 2016-08-21 ENCOUNTER — Telehealth: Payer: Self-pay

## 2016-08-21 NOTE — Telephone Encounter (Signed)
Request for surgical clearance:   1. What type surgery is being performed? Colonoscopy  2. When is this surgery scheduled? 09/30/2016  3. Are there any medications that need to be held prior to surgery and how long? Eliquis 5 mg - the night before and morning of procedure?  4. Name of the physician performing surgery: Dr Herbie Baltimore Buccini  5. What is the office phone and fax number?   Phone [phone]  Fax [phone]

## 2016-08-28 ENCOUNTER — Encounter: Payer: Self-pay | Admitting: Cardiovascular Disease

## 2016-08-28 NOTE — Telephone Encounter (Signed)
Sent via Epic

## 2016-08-28 NOTE — Telephone Encounter (Signed)
Clearance routed via EPIC 

## 2016-09-30 ENCOUNTER — Telehealth: Payer: Self-pay | Admitting: Cardiovascular Disease

## 2016-09-30 NOTE — Telephone Encounter (Signed)
Spoke with Shane [name] states that Dr Leota Sauers at [facility] did a colonoscopy yesterday and per Shane [name] he spoke with Dr C because Shane [name] HR was in the 100's he thinks that it was 130 and back down into the 90's what do we do now?

## 2016-09-30 NOTE — Telephone Encounter (Signed)
New Message  Pt call requesting to speak with RN. Pt states he had a colonoscopy and would like to speak with RN to ask some questions. please call back to discuss

## 2016-09-30 NOTE — Telephone Encounter (Signed)
Patient scheduled to see MCr tomorrow at [place].

## 2016-10-01 ENCOUNTER — Encounter: Payer: Self-pay | Admitting: Cardiovascular Disease

## 2016-10-01 ENCOUNTER — Ambulatory Visit (INDEPENDENT_AMBULATORY_CARE_PROVIDER_SITE_OTHER): Payer: BLUE CROSS/BLUE SHIELD | Admitting: Cardiovascular Disease

## 2016-10-01 VITALS — BP 148/82 | HR 116 | Ht 71.0 in | Wt 284.0 lb

## 2016-10-01 DIAGNOSIS — G4733 Obstructive sleep apnea (adult) (pediatric): Secondary | ICD-10-CM | POA: Diagnosis not present

## 2016-10-01 DIAGNOSIS — D689 Coagulation defect, unspecified: Secondary | ICD-10-CM | POA: Diagnosis not present

## 2016-10-01 DIAGNOSIS — I4819 Other persistent atrial fibrillation: Secondary | ICD-10-CM

## 2016-10-01 DIAGNOSIS — Z950 Presence of cardiac pacemaker: Secondary | ICD-10-CM | POA: Diagnosis not present

## 2016-10-01 DIAGNOSIS — Z01812 Encounter for preprocedural laboratory examination: Secondary | ICD-10-CM

## 2016-10-01 DIAGNOSIS — I5033 Acute on chronic diastolic (congestive) heart failure: Secondary | ICD-10-CM

## 2016-10-01 DIAGNOSIS — I952 Hypotension due to drugs: Secondary | ICD-10-CM

## 2016-10-01 DIAGNOSIS — I481 Persistent atrial fibrillation: Secondary | ICD-10-CM | POA: Diagnosis not present

## 2016-10-01 MED ORDER — METOPROLOL SUCCINATE ER 50 MG PO TB24: 50.0000 mg | ORAL_TABLET | Freq: Every day | ORAL | 11 refills | Status: DC

## 2016-10-01 NOTE — Progress Notes (Signed)
Patient ID: Shane [name], male   DOB: [birth year], 71 y.o.   MRN: [id number]     Cardiology Office Note    Date:  10/02/2016   [name] [birth year], MRN [id number]  PCP:  Shane Billet, MD  Cardiologist:   Shane Klein, MD   Chief Complaint  Patient presents with  . Follow-up  . Headache    History of Present Illness:  Shane [name] is a 71 y.o. male who presents for persistent atrial fibrillation and pacemaker check.  He went to have his colonoscopy yesterday and had heart rates in the 110-120s, sometimes increasing to the 130s-150s. The anesthesiologist was reluctant to do the procedure, but eventually he had the procedure performed without problem. He is not really aware of the tachycardia. He never has been able to tell what his rhythm is. About a year ago we had to decrease his dose of diltiazem due to symptoms of hypotension.  His Eliquis was interrupted for the colonoscopy. He just restarted it today.  Today in the office his heart rate is in the 105-115 range. Pacemaker interrogation shows that he has no atrial pacing and roughly 49% ventricular pacing. He has been in atrial fibrillation without interruption since generator 24th. Based on the current device check it appears that the atrial fibrillation is not permanent as I had suspected. We might be able to restore sinus rhythm with cardioversion.   Past Medical History:  Diagnosis Date  . Atrioventricular block   . Degenerative joint disease   . Paroxysmal atrial fibrillation (HCC)   . 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 Sinus node dysfunction [facility])     Past Surgical History:  Procedure Laterality Date  . CARDIAC CATHETERIZATION  10/13/1994   No CAD  . CARDIAC CATHETERIZATION  05/10/2003   No CAD  . NM MYOCAR PERF WALL MOTION  05/07/2011   Lexiscan: No ishcemia  . PERMANENT PACEMAKER INSERTION  03/12/2010   guidant  . ROTATOR CUFF REPAIR  1996  . US ECHOCARDIOGRAPHY   05/07/2011   mod. LVH,LA mod. dilated,borderline aortic root dilatation    Outpatient Medications Prior to Visit  Medication Sig Dispense Refill  . Ascorbic Acid (VITAMIN C) 1000 MG tablet Take 1,000 mg by mouth daily.    . B Complex-C-Folic Acid (STRESS FORMULA) TABS Take by mouth daily.    . Calcium Citrate (CITRACAL PO) Take by mouth daily.    . celecoxib (CELEBREX) 200 MG capsule Take 1 capsule (200 mg total) by mouth daily. 30 capsule 6  . Cholecalciferol (VITAMIN D-3) 1000 UNITS CAPS Take 1 capsule by mouth daily.    . citalopram (CELEXA) 40 MG tablet Take 1 tablet (40 mg total) by mouth daily. MUST RECEIVE FUTURE REFILLS FROM PCP. 30 tablet 0  . diltiazem (CARDIZEM CD) 240 MG 24 hr capsule Take 1 capsule (240 mg total) by mouth daily. 30 capsule 10  . doxazosin (CARDURA) 4 MG tablet Take 4 mg by mouth at bedtime.    Marland Kitchen ELIQUIS 5 MG TABS tablet Take 1 tablet (5 mg total) by mouth 2 (two) times daily. 60 tablet 9  . furosemide (LASIX) 40 MG tablet Take 1 tablet (40 mg total) by mouth daily. 30 tablet 9  . glucosamine-chondroitin 500-400 MG tablet Take 1 tablet by mouth 2 (two) times daily.     . indomethacin (INDOCIN) 50 MG capsule Take 50 mg by mouth as needed (Gout).    . montelukast (SINGULAIR)  10 MG tablet Take 10 mg by mouth at bedtime.    . Omega-3 Fatty Acids (FISH OIL) 1200 MG CAPS Take 1,200 mg by mouth 2 (two) times daily.     Marland Kitchen testosterone (ANDROGEL) 50 MG/5GM GEL Place 5 g onto the skin daily.    . vitamin B-12 (CYANOCOBALAMIN) 1000 MCG tablet Take 1,000 mcg by mouth daily.    . metoprolol succinate (TOPROL-XL) 25 MG 24 hr tablet Take 1 tablet (25 mg total) by mouth daily. 30 tablet 5  . HYDROcodone-acetaminophen (NORCO/VICODIN) 5-325 MG per tablet as needed. For neck pain.  0   No facility-administered medications prior to visit.      Allergies:   Patient has no known allergies.   Social History   Social History  . Marital status: Married    Spouse name: N/A  .  Number of children: N/A  . Years of education: N/A   Social History Main Topics  . Smoking status: Former Smoker    Types: Cigars  . Smokeless tobacco: Never Used  . Alcohol use Yes     Comment: social  . Drug use: No  . Sexual activity: Not Asked   Other Topics Concern  . None   Social History Narrative  . None     Family History:  The patient's family history includes Heart failure in his mother; Stroke in his mother.   ROS:   Please see the history of present illness.    ROS All other systems reviewed and are negative.   PHYSICAL EXAM:   VS:  BP (!) 148/82   Pulse (!) 116   Ht 5\' 11"  (1.803 m)   Wt 128.8 kg (284 lb)   BMI 39.61 kg/m    GEN: Well nourished, well developed, in no acute distress  HEENT: normal  Neck: no JVD, carotid bruits, or masses Cardiac: irregular, no murmurs, rubs, or gallops,no edema, healthy pacemaker site  Respiratory:  clear to auscultation bilaterally, normal work of breathing GI: soft, nontender, nondistended, + BS MS: no deformity or atrophy  Skin: warm and dry, no rash Neuro:  Alert and Oriented x 3, Strength and sensation are intact Psych: euthymic mood, full affect  Wt Readings from Last 3 Encounters:  10/01/16 128.8 kg (284 lb)  04/01/16 125.6 kg (277 lb)  10/02/15 129.8 kg (286 lb 2 oz)      Studies/Labs Reviewed:   EKG:  EKG is ordered today.  The ekg ordered today demonstrates atrial fibrillation with occasional paced ventricular beats (roughly 50/50 sensed/paced)   ASSESSMENT:    1. Chronic atrial fibrillation ([place])   2. Pacemaker for sinus node dysfunction   3. Acute on chronic diastolic CHF (congestive heart failure), NYHA class 2 ([place])   4. Hypotension due to drugs   5. OSA (obstructive sleep apnea)   6. Obesity, morbid (more than 100 lbs over ideal weight or BMI > 40) (HCC)   7. Blood clotting disorder ([place])   8. Pre-procedure lab exam      PLAN:  In order of problems listed above:  1. AFib:  Rate  control is not satisfactory.  No bleeding complications on Eliquis. No history of embolic stroke. CHADSVasc score at least 2 (age, heart failure). Will attempt a cardioversion if he remains in persistent A. fib, but have to wait for at least 3 weeks of full anticoagulation since his anticoagulant was stopped for his colonoscopy. This procedure has been fully reviewed with the patient and written informed consent has been  obtained. 2. PM: initially implanted for sinus node dysfunction. 50% ventricular pacing. Remote downloads Q 3 months. 3. CHF: Appears to be clinically euvolemic, NYHA functional class 1-2. Weight changes in past appeared to be due to real weight rather than edema.  4. Hypotension: Reduced diltiazem to 240 mg daily at January visit. BP is now higher. Will increase the metoprolol 5. OSA: Reports 100% compliance with CPAP 6. Obesity: Long term, weight reduction would be highly beneficial. He has shown broad swings in weight due to inability to comply with dietary restrictions beyond a few months.    Medication Adjustments/Labs and Tests Ordered: Current medicines are reviewed at length with the patient today.  Concerns regarding medicines are outlined above.  Medication changes, Labs and Tests ordered today are listed in the Patient Instructions below. Patient Instructions  Dr Sallyanne Kuster has recommended making the following medication changes: 1. INCREASE Metoprolol to 50 mg daily  Your physician recommends that you schedule a nurse visit for an EKG on Monday, February 5th.  Your physician has recommended that you have a Cardioversion (DCCV) on February 20th, 2018 with Dr Loletha Grayer. Electrical Cardioversion uses a jolt of electricity to your heart either through paddles or wired patches attached to your chest. This is a controlled, usually prescheduled, procedure. Defibrillation is done under light anesthesia in the hospital, and you usually go home the day of the procedure. This is done to get  your heart back into a normal rhythm. You are not awake for the procedure. Please see the instruction sheet given to you today.  You will be required to have the following tests prior to the procedure: 1. Blood work - the blood work can be done no more than 7 days prior to the procedure. It can be done at any [facility] lab. There is one downstairs on the first floor of this building and one in the [facility] building [phone]).      Signed, Shane Klein, MD  10/02/2016 5:48 PM    [facility] [address], [place], [place]  [address] Phone: [phone]; Fax: [phone]

## 2016-10-01 NOTE — Patient Instructions (Signed)
Dr Sallyanne Kuster has recommended making the following medication changes: 1. INCREASE Metoprolol to 50 mg daily  Your physician recommends that you schedule a nurse visit for an EKG on Monday, February 5th.  Your physician has recommended that you have a Cardioversion (DCCV) on February 20th, 2018 with Dr Loletha Grayer. Electrical Cardioversion uses a jolt of electricity to your heart either through paddles or wired patches attached to your chest. This is a controlled, usually prescheduled, procedure. Defibrillation is done under light anesthesia in the hospital, and you usually go home the day of the procedure. This is done to get your heart back into a normal rhythm. You are not awake for the procedure. Please see the instruction sheet given to you today.  You will be required to have the following tests prior to the procedure: 1. Blood work - the blood work can be done no more than 7 days prior to the procedure. It can be done at any [facility] lab. There is one downstairs on the first floor of this building and one in the [facility] building [phone]).

## 2016-10-06 ENCOUNTER — Ambulatory Visit (INDEPENDENT_AMBULATORY_CARE_PROVIDER_SITE_OTHER): Payer: BLUE CROSS/BLUE SHIELD | Admitting: *Deleted

## 2016-10-06 VITALS — HR 93

## 2016-10-06 DIAGNOSIS — I4819 Other persistent atrial fibrillation: Secondary | ICD-10-CM

## 2016-10-06 DIAGNOSIS — Z79899 Other long term (current) drug therapy: Secondary | ICD-10-CM

## 2016-10-06 DIAGNOSIS — I481 Persistent atrial fibrillation: Secondary | ICD-10-CM

## 2016-10-07 NOTE — Progress Notes (Signed)
EKG performed by [employer], signed by Dr. Sallyanne Kuster. He recommended patient follow up as planned for scheduled cardioversion.

## 2016-10-07 NOTE — Patient Instructions (Signed)
Follow up as planned for scheduled cardioversion.

## 2016-10-13 LAB — CUP PACEART INCLINIC DEVICE CHECK
Implantable Lead Implant Date: 20041025
Implantable Lead Serial Number: 338209
Implantable Lead Serial Number: 424134
MDC IDC LEAD IMPLANT DT: 20041025
MDC IDC LEAD LOCATION: 753859
MDC IDC LEAD LOCATION: 753860
MDC IDC PG IMPLANT DT: 20110712
MDC IDC PG SERIAL: 598266
MDC IDC SESS DTM: 20180212090631

## 2016-10-14 LAB — BASIC METABOLIC PANEL
BUN: 14 mg/dL (ref 7–25)
CHLORIDE: 103 mmol/L (ref 98–110)
CO2: 32 mmol/L — AB (ref 20–31)
Calcium: 9.4 mg/dL (ref 8.6–10.3)
Creat: 1.02 mg/dL (ref 0.70–1.18)
Glucose, Bld: 88 mg/dL (ref 65–99)
Potassium: 4.6 mmol/L (ref 3.5–5.3)
Sodium: 142 mmol/L (ref 135–146)

## 2016-10-14 LAB — CBC
HCT: 48.3 % (ref 38.5–50.0)
Hemoglobin: 16.9 g/dL (ref 13.2–17.1)
MCH: 32.9 pg (ref 27.0–33.0)
MCHC: 35 g/dL (ref 32.0–36.0)
MCV: 94 fL (ref 80.0–100.0)
MPV: 11.3 fL (ref 7.5–12.5)
PLATELETS: 190 10*3/uL (ref 140–400)
RBC: 5.14 MIL/uL (ref 4.20–5.80)
RDW: 13.6 % (ref 11.0–15.0)
WBC: 3.6 10*3/uL — ABNORMAL LOW (ref 3.8–10.8)

## 2016-10-14 LAB — PROTIME-INR
INR: 1.1
Prothrombin Time: 11.8 s — ABNORMAL HIGH (ref 9.0–11.5)

## 2016-10-14 LAB — TSH: TSH: 1.63 m[IU]/L (ref 0.40–4.50)

## 2016-10-14 LAB — APTT: APTT: 28 s (ref 22–34)

## 2016-10-18 ENCOUNTER — Other Ambulatory Visit: Payer: Self-pay

## 2016-10-18 DIAGNOSIS — I4819 Other persistent atrial fibrillation: Secondary | ICD-10-CM

## 2016-10-21 ENCOUNTER — Ambulatory Visit (HOSPITAL_COMMUNITY)
Admission: RE | Admit: 2016-10-21 | Discharge: 2016-10-21 | Disposition: A | Discharge status: Home / Self Care | Payer: BLUE CROSS/BLUE SHIELD | Source: Ambulatory Visit | Attending: Cardiovascular Disease | Admitting: Cardiovascular Disease

## 2016-10-21 ENCOUNTER — Encounter (HOSPITAL_COMMUNITY): Payer: Self-pay | Admitting: Cardiovascular Disease

## 2016-10-21 ENCOUNTER — Telehealth: Payer: Self-pay | Admitting: Cardiovascular Disease

## 2016-10-21 ENCOUNTER — Ambulatory Visit (HOSPITAL_COMMUNITY): Payer: BLUE CROSS/BLUE SHIELD | Admitting: Anesthesiology

## 2016-10-21 ENCOUNTER — Encounter (HOSPITAL_COMMUNITY)
Admission: RE | Disposition: A | Discharge status: Home / Self Care | Payer: Self-pay | Source: Ambulatory Visit | Attending: Cardiovascular Disease

## 2016-10-21 DIAGNOSIS — Z7901 Long term (current) use of anticoagulants: Secondary | ICD-10-CM | POA: Insufficient documentation

## 2016-10-21 DIAGNOSIS — I481 Persistent atrial fibrillation: Secondary | ICD-10-CM

## 2016-10-21 DIAGNOSIS — I5042 Chronic combined systolic (congestive) and diastolic (congestive) heart failure: Secondary | ICD-10-CM | POA: Diagnosis present

## 2016-10-21 DIAGNOSIS — I4819 Other persistent atrial fibrillation: Secondary | ICD-10-CM | POA: Diagnosis present

## 2016-10-21 DIAGNOSIS — I5032 Chronic diastolic (congestive) heart failure: Secondary | ICD-10-CM | POA: Diagnosis present

## 2016-10-21 DIAGNOSIS — I495 Sick sinus syndrome: Secondary | ICD-10-CM | POA: Insufficient documentation

## 2016-10-21 DIAGNOSIS — I482 Chronic atrial fibrillation: Secondary | ICD-10-CM | POA: Insufficient documentation

## 2016-10-21 DIAGNOSIS — I5033 Acute on chronic diastolic (congestive) heart failure: Secondary | ICD-10-CM | POA: Insufficient documentation

## 2016-10-21 DIAGNOSIS — G4733 Obstructive sleep apnea (adult) (pediatric): Secondary | ICD-10-CM | POA: Insufficient documentation

## 2016-10-21 DIAGNOSIS — Z950 Presence of cardiac pacemaker: Secondary | ICD-10-CM | POA: Diagnosis present

## 2016-10-21 DIAGNOSIS — I4891 Unspecified atrial fibrillation: Secondary | ICD-10-CM

## 2016-10-21 DIAGNOSIS — D689 Coagulation defect, unspecified: Secondary | ICD-10-CM | POA: Insufficient documentation

## 2016-10-21 DIAGNOSIS — Z79899 Other long term (current) drug therapy: Secondary | ICD-10-CM | POA: Insufficient documentation

## 2016-10-21 DIAGNOSIS — Z6839 Body mass index (BMI) 39.0-39.9, adult: Secondary | ICD-10-CM | POA: Insufficient documentation

## 2016-10-21 DIAGNOSIS — Z823 Family history of stroke: Secondary | ICD-10-CM | POA: Insufficient documentation

## 2016-10-21 DIAGNOSIS — Z8249 Family history of ischemic heart disease and other diseases of the circulatory system: Secondary | ICD-10-CM | POA: Insufficient documentation

## 2016-10-21 DIAGNOSIS — I952 Hypotension due to drugs: Secondary | ICD-10-CM | POA: Insufficient documentation

## 2016-10-21 DIAGNOSIS — Z87891 Personal history of nicotine dependence: Secondary | ICD-10-CM | POA: Insufficient documentation

## 2016-10-21 DIAGNOSIS — I480 Paroxysmal atrial fibrillation: Secondary | ICD-10-CM | POA: Insufficient documentation

## 2016-10-21 DIAGNOSIS — M1990 Unspecified osteoarthritis, unspecified site: Secondary | ICD-10-CM | POA: Insufficient documentation

## 2016-10-21 HISTORY — PX: CARDIOVERSION: SHX1299

## 2016-10-21 SURGERY — CARDIOVERSION
Anesthesia: General

## 2016-10-21 MED ORDER — SODIUM CHLORIDE 0.9% FLUSH: 3.0000 mL | INTRAVENOUS | Status: DC | PRN

## 2016-10-21 MED ORDER — PROPOFOL 10 MG/ML IV BOLUS
INTRAVENOUS | Status: DC | PRN
  Administered 2016-10-21: 80 mg via INTRAVENOUS

## 2016-10-21 MED ORDER — SODIUM CHLORIDE 0.9 % IV SOLN
INTRAVENOUS | Status: DC
  Administered 2016-10-21: 13:00:00 via INTRAVENOUS

## 2016-10-21 MED ORDER — HYDROCORTISONE 1 % EX CREA: 1.0000 "application " | TOPICAL_CREAM | Freq: Three times a day (TID) | CUTANEOUS | Status: DC | PRN

## 2016-10-21 MED ORDER — LIDOCAINE HCL (CARDIAC) 20 MG/ML IV SOLN
INTRAVENOUS | Status: DC | PRN
  Administered 2016-10-21: 100 mg via INTRAVENOUS

## 2016-10-21 NOTE — Anesthesia Preprocedure Evaluation (Addendum)
Anesthesia Evaluation  Patient identified by MRN, date of birth, ID band Patient awake    Reviewed: Allergy & Precautions, NPO status , Patient's Chart, lab work & pertinent test results  Airway Mallampati: II  TM Distance: >3 FB Neck ROM: Full    Dental no notable dental hx. (+) Dental Advisory Given, Teeth Intact   Pulmonary sleep apnea , former smoker,    Pulmonary exam normal        Cardiovascular +CHF  Normal cardiovascular exam+ dysrhythmias Atrial Fibrillation + pacemaker      Neuro/Psych negative neurological ROS  negative psych ROS   GI/Hepatic negative GI ROS, Neg liver ROS,   Endo/Other  negative endocrine ROS  Renal/GU negative Renal ROS  negative genitourinary   Musculoskeletal negative musculoskeletal ROS (+) Arthritis , Osteoarthritis,    Abdominal   Peds negative pediatric ROS (+)  Hematology negative hematology ROS (+)   Anesthesia Other Findings   Reproductive/Obstetrics negative OB ROS                            Anesthesia Physical Anesthesia Plan  ASA: III  Anesthesia Plan: General   Post-op Pain Management:    Induction: Intravenous  Airway Management Planned: Mask  Additional Equipment:   Intra-op Plan:   Post-operative Plan:   Informed Consent: I have reviewed the patients History and Physical, chart, labs and discussed the procedure including the risks, benefits and alternatives for the proposed anesthesia with the patient or authorized representative who has indicated his/her understanding and acceptance.   Dental advisory given  Plan Discussed with: CRNA, Anesthesiologist and Surgeon  Anesthesia Plan Comments:         Anesthesia Quick Evaluation

## 2016-10-21 NOTE — Telephone Encounter (Signed)
nEW mESSAGE    Pt states that Dr C told his family that he stopped taking all his medication , pt states that he only stopped taking the Eliquis and he started taking it again the next evening after his colonoscopy , he has taken the beta blocker every day since you put him on it, he did not discontinue it.

## 2016-10-21 NOTE — Transfer of Care (Signed)
Immediate Anesthesia Transfer of Care Note  Patient: Shane [name]  Procedure(s) Performed: Procedure(s): CARDIOVERSION (N/A)  Patient Location: Endoscopy Unit  Anesthesia Type:General  Level of Consciousness: awake, alert , oriented and patient cooperative  Airway & Oxygen Therapy: Patient Spontanous Breathing and Patient connected to nasal cannula oxygen  Post-op Assessment: Report given to RN, Post -op Vital signs reviewed and stable and Patient moving all extremities X 4  Post vital signs: Reviewed and stable  Last Vitals:  Vitals:   10/21/16 1206 10/21/16 1320  BP: (!) 142/60 (!) 143/68  Pulse: (!) 108 87  Resp: 11 15    Last Pain:  Vitals:   10/21/16 1206  TempSrc: Oral         Complications: No apparent anesthesia complications

## 2016-10-21 NOTE — Telephone Encounter (Signed)
Understood MCr 

## 2016-10-21 NOTE — Interval H&P Note (Signed)
History and Physical Interval Note:  10/21/2016 11:25 AM  Shane [name]  has presented today for surgery, with the diagnosis of AFIB  The various methods of treatment have been discussed with the patient and family. After consideration of risks, benefits and other options for treatment, the patient has consented to  Procedure(s): CARDIOVERSION (N/A) as a surgical intervention .  The patient's history has been reviewed, patient examined, no change in status, stable for surgery.  I have reviewed the patient's chart and labs.  Questions were answered to the patient's satisfaction.     Vonne Mcdanel

## 2016-10-21 NOTE — Discharge Instructions (Signed)
Electrical Cardioversion, Care After °This sheet gives you information about how to care for yourself after your procedure. Your health care provider may also give you more specific instructions. If you have problems or questions, contact your health care provider. °What can I expect after the procedure? °After the procedure, it is common to have: °· Some redness on the skin where the shocks were given. °Follow these instructions at home: °· Do not drive for 24 hours if you were given a medicine to help you relax (sedative). °· Take over-the-counter and prescription medicines only as told by your health care provider. °· Ask your health care provider how to check your pulse. Check it often. °· Rest for 48 hours after the procedure or as told by your health care provider. °· Avoid or limit your caffeine use as told by your health care provider. °Contact a health care provider if: °· You feel like your heart is beating too quickly or your pulse is not regular. °· You have a serious muscle cramp that does not go away. °Get help right away if: °· You have discomfort in your chest. °· You are dizzy or you feel faint. °· You have trouble breathing or you are short of breath. °· Your speech is slurred. °· You have trouble moving an arm or leg on one side of your body. °· Your fingers or toes turn cold or blue. °This information is not intended to replace advice given to you by your health care provider. Make sure you discuss any questions you have with your health care provider. °Document Released: 06/08/2013 Document Revised: 03/21/2016 Document Reviewed: 02/22/2016 °Elsevier Interactive Patient Education © 2017 [employer] ° °

## 2016-10-21 NOTE — Op Note (Signed)
Procedure: Electrical Cardioversion Indications:  Atrial Fibrillation  Procedure Details:  Consent: Risks of procedure as well as the alternatives and risks of each were explained to the (patient/caregiver).  Consent for procedure obtained.  Time Out: Verified patient identification, verified procedure, site/side was marked, verified correct patient position, special equipment/implants available, medications/allergies/relevent history reviewed, required imaging and test results available.  Performed  Patient placed on cardiac monitor, pulse oximetry, supplemental oxygen as necessary.  Sedation given: IV propofol, Dr. Tobias Alexander Pacer pads placed anterior and posterior chest.  Cardioverted 2 time(s).  Cardioversion with synchronized biphasic 200J shock.  Evaluation: Findings: Post procedure EKG shows: Atrial Fibrillation Complications: None Patient did tolerate procedure well.  Time Spent Directly with the Patient:  30 minutes   Burdett Pinzon 10/21/2016, 1:42 PM

## 2016-10-21 NOTE — H&P (View-Only) (Signed)
Patient ID: Shane [name], male   DOB: 11-20-1945, 71 y.o.   MRN: [id number]     Cardiology Office Note    Date:  10/02/2016   [name], [place] 14-Mar-1946, MRN [id number]  PCP:  Shane Billet, MD  Cardiologist:   Shane Klein, MD   Chief Complaint  Patient presents with  . Follow-up  . Headache    History of Present Illness:  Shane [name] is a 71 y.o. male who presents for persistent atrial fibrillation and pacemaker check.  He went to have his colonoscopy yesterday and had heart rates in the 110-120s, sometimes increasing to the 130s-150s. The anesthesiologist was reluctant to do the procedure, but eventually he had the procedure performed without problem. He is not really aware of the tachycardia. He never has been able to tell what his rhythm is. About a year ago we had to decrease his dose of diltiazem due to symptoms of hypotension.  His Eliquis was interrupted for the colonoscopy. He just restarted it today.  Today in the office his heart rate is in the 105-115 range. Pacemaker interrogation shows that he has no atrial pacing and roughly 49% ventricular pacing. He has been in atrial fibrillation without interruption since generator 24th. Based on the current device check it appears that the atrial fibrillation is not permanent as I had suspected. We might be able to restore sinus rhythm with cardioversion.   Past Medical History:  Diagnosis Date  . Atrioventricular block   . Degenerative joint disease   . Paroxysmal atrial fibrillation (HCC)   . 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 Sinus node dysfunction [facility])     Past Surgical History:  Procedure Laterality Date  . CARDIAC CATHETERIZATION  10/13/1994   No CAD  . CARDIAC CATHETERIZATION  05/10/2003   No CAD  . NM MYOCAR PERF WALL MOTION  05/07/2011   Lexiscan: No ishcemia  . PERMANENT PACEMAKER INSERTION  03/12/2010   guidant  . ROTATOR CUFF REPAIR  1996  . US ECHOCARDIOGRAPHY   05/07/2011   mod. LVH,LA mod. dilated,borderline aortic root dilatation    Outpatient Medications Prior to Visit  Medication Sig Dispense Refill  . Ascorbic Acid (VITAMIN C) 1000 MG tablet Take 1,000 mg by mouth daily.    . B Complex-C-Folic Acid (STRESS FORMULA) TABS Take by mouth daily.    . Calcium Citrate (CITRACAL PO) Take by mouth daily.    . celecoxib (CELEBREX) 200 MG capsule Take 1 capsule (200 mg total) by mouth daily. 30 capsule 6  . Cholecalciferol (VITAMIN D-3) 1000 UNITS CAPS Take 1 capsule by mouth daily.    . citalopram (CELEXA) 40 MG tablet Take 1 tablet (40 mg total) by mouth daily. MUST RECEIVE FUTURE REFILLS FROM PCP. 30 tablet 0  . diltiazem (CARDIZEM CD) 240 MG 24 hr capsule Take 1 capsule (240 mg total) by mouth daily. 30 capsule 10  . doxazosin (CARDURA) 4 MG tablet Take 4 mg by mouth at bedtime.    Marland Kitchen ELIQUIS 5 MG TABS tablet Take 1 tablet (5 mg total) by mouth 2 (two) times daily. 60 tablet 9  . furosemide (LASIX) 40 MG tablet Take 1 tablet (40 mg total) by mouth daily. 30 tablet 9  . glucosamine-chondroitin 500-400 MG tablet Take 1 tablet by mouth 2 (two) times daily.     . indomethacin (INDOCIN) 50 MG capsule Take 50 mg by mouth as needed (Gout).    . montelukast (SINGULAIR)  10 MG tablet Take 10 mg by mouth at bedtime.    . Omega-3 Fatty Acids (FISH OIL) 1200 MG CAPS Take 1,200 mg by mouth 2 (two) times daily.     Marland Kitchen testosterone (ANDROGEL) 50 MG/5GM GEL Place 5 g onto the skin daily.    . vitamin B-12 (CYANOCOBALAMIN) 1000 MCG tablet Take 1,000 mcg by mouth daily.    . metoprolol succinate (TOPROL-XL) 25 MG 24 hr tablet Take 1 tablet (25 mg total) by mouth daily. 30 tablet 5  . HYDROcodone-acetaminophen (NORCO/VICODIN) 5-325 MG per tablet as needed. For neck pain.  0   No facility-administered medications prior to visit.      Allergies:   Patient has no known allergies.   Social History   Social History  . Marital status: Married    Spouse name: N/A  .  Number of children: N/A  . Years of education: N/A   Social History Main Topics  . Smoking status: Former Smoker    Types: Cigars  . Smokeless tobacco: Never Used  . Alcohol use Yes     Comment: social  . Drug use: No  . Sexual activity: Not Asked   Other Topics Concern  . None   Social History Narrative  . None     Family History:  The patient's family history includes Heart failure in his mother; Stroke in his mother.   ROS:   Please see the history of present illness.    ROS All other systems reviewed and are negative.   PHYSICAL EXAM:   VS:  BP (!) 148/82   Pulse (!) 116   Ht 5\' 11"  (1.803 m)   Wt 128.8 kg (284 lb)   BMI 39.61 kg/m    GEN: Well nourished, well developed, in no acute distress  HEENT: normal  Neck: no JVD, carotid bruits, or masses Cardiac: irregular, no murmurs, rubs, or gallops,no edema, healthy pacemaker site  Respiratory:  clear to auscultation bilaterally, normal work of breathing GI: soft, nontender, nondistended, + BS MS: no deformity or atrophy  Skin: warm and dry, no rash Neuro:  Alert and Oriented x 3, Strength and sensation are intact Psych: euthymic mood, full affect  Wt Readings from Last 3 Encounters:  10/01/16 128.8 kg (284 lb)  04/01/16 125.6 kg (277 lb)  10/02/15 129.8 kg (286 lb 2 oz)      Studies/Labs Reviewed:   EKG:  EKG is ordered today.  The ekg ordered today demonstrates atrial fibrillation with occasional paced ventricular beats (roughly 50/50 sensed/paced)   ASSESSMENT:    1. Chronic atrial fibrillation ([place])   2. Pacemaker for sinus node dysfunction   3. Acute on chronic diastolic CHF (congestive heart failure), NYHA class 2 ([place])   4. Hypotension due to drugs   5. OSA (obstructive sleep apnea)   6. Obesity, morbid (more than 100 lbs over ideal weight or BMI > 40) (HCC)   7. Blood clotting disorder ([place])   8. Pre-procedure lab exam      PLAN:  In order of problems listed above:  1. AFib:  Rate  control is not satisfactory.  No bleeding complications on Eliquis. No history of embolic stroke. CHADSVasc score at least 2 (age, heart failure). Will attempt a cardioversion if he remains in persistent A. fib, but have to wait for at least 3 weeks of full anticoagulation since his anticoagulant was stopped for his colonoscopy. This procedure has been fully reviewed with the patient and written informed consent has been  obtained. 2. PM: initially implanted for sinus node dysfunction. 50% ventricular pacing. Remote downloads Q 3 months. 3. CHF: Appears to be clinically euvolemic, NYHA functional class 1-2. Weight changes in past appeared to be due to real weight rather than edema.  4. Hypotension: Reduced diltiazem to 240 mg daily at January visit. BP is now higher. Will increase the metoprolol 5. OSA: Reports 100% compliance with CPAP 6. Obesity: Long term, weight reduction would be highly beneficial. He has shown broad swings in weight due to inability to comply with dietary restrictions beyond a few months.    Medication Adjustments/Labs and Tests Ordered: Current medicines are reviewed at length with the patient today.  Concerns regarding medicines are outlined above.  Medication changes, Labs and Tests ordered today are listed in the Patient Instructions below. Patient Instructions  Dr Sallyanne Kuster has recommended making the following medication changes: 1. INCREASE Metoprolol to 50 mg daily  Your physician recommends that you schedule a nurse visit for an EKG on Monday, February 5th.  Your physician has recommended that you have a Cardioversion (DCCV) on February 20th, 2018 with Dr Loletha Grayer. Electrical Cardioversion uses a jolt of electricity to your heart either through paddles or wired patches attached to your chest. This is a controlled, usually prescheduled, procedure. Defibrillation is done under light anesthesia in the hospital, and you usually go home the day of the procedure. This is done to get  your heart back into a normal rhythm. You are not awake for the procedure. Please see the instruction sheet given to you today.  You will be required to have the following tests prior to the procedure: 1. Blood work - the blood work can be done no more than 7 days prior to the procedure. It can be done at any [facility] lab. There is one downstairs on the first floor of this building and one in the [facility] building [phone]).      Signed, Shane Klein, MD  10/02/2016 5:48 PM    [facility] [place], [place], [place]  [address] Phone: [phone]; Fax: [phone]

## 2016-10-21 NOTE — Telephone Encounter (Signed)
Pt of Dr. Sallyanne Kuster New A Fib, cardioversion done today  Spoke to patient. He was under the impression that Dr. Sallyanne Kuster told patient's wife and daughter he'd stopped the metoprolol at time of his procedure 3 weeks ago (? Endoscopy) - patient just wanted to make Dr. Sallyanne Kuster aware he only stopped the eliquis at that time.   Wanted to make sure Dr. Sallyanne Kuster was aware, in case this "shed any light on my current situation". Informed him I'd let provider know.   Routed as FYI. Advise if any further follow up - patient does not need call back unless new information to relay.

## 2016-10-22 NOTE — Anesthesia Postprocedure Evaluation (Addendum)
Anesthesia Post Note  Patient: Shane [name]  Procedure(s) Performed: Procedure(s) (LRB): CARDIOVERSION (N/A)  Patient location during evaluation: PACU Anesthesia Type: General Level of consciousness: sedated Pain management: pain level controlled Vital Signs Assessment: post-procedure vital signs reviewed and stable Respiratory status: spontaneous breathing and respiratory function stable Cardiovascular status: stable Anesthetic complications: no       Last Vitals:  Vitals:   10/21/16 1340 10/21/16 1350  BP: (!) 150/84 133/84  Pulse: 95 85  Resp: 13 (!) 22    Last Pain:  Vitals:   10/21/16 1206  TempSrc: Oral                 [name],Tod DANIEL

## 2016-10-23 ENCOUNTER — Telehealth: Payer: Self-pay

## 2016-10-23 DIAGNOSIS — I4819 Other persistent atrial fibrillation: Secondary | ICD-10-CM

## 2016-10-23 NOTE — Telephone Encounter (Signed)
Echo ordered.

## 2016-10-23 NOTE — Telephone Encounter (Signed)
-----   Message from Sanda Klein, MD sent at 10/21/2016  1:43 PM EST ----- Please schedule for: - device download 3 months - office visit 6 months - echo non urgent first available (atrial fibrillation) Thanks MCr

## 2016-11-06 ENCOUNTER — Ambulatory Visit (HOSPITAL_COMMUNITY): Payer: BLUE CROSS/BLUE SHIELD | Attending: Internal Medicine

## 2016-11-06 ENCOUNTER — Other Ambulatory Visit: Payer: Self-pay

## 2016-11-06 DIAGNOSIS — I351 Nonrheumatic aortic (valve) insufficiency: Secondary | ICD-10-CM | POA: Diagnosis not present

## 2016-11-06 DIAGNOSIS — I4819 Other persistent atrial fibrillation: Secondary | ICD-10-CM

## 2016-11-06 DIAGNOSIS — I481 Persistent atrial fibrillation: Secondary | ICD-10-CM | POA: Diagnosis not present

## 2016-11-14 ENCOUNTER — Telehealth: Payer: Self-pay | Admitting: Cardiovascular Disease

## 2016-11-14 NOTE — Telephone Encounter (Signed)
-----   Message from Pixie Casino, MD sent at 11/07/2016 12:14 PM EST ----- LVEF 50-55%.  Dr. Lemmie Evens (for Dr. Loletha Grayer)

## 2016-11-14 NOTE — Telephone Encounter (Signed)
New message    Pt is calling for his echo results. He states he had it done 8 days ago and he was told he'd hear something the next day.

## 2016-11-14 NOTE — Telephone Encounter (Signed)
Spoke to patient. echo Result given . Verbalized understanding

## 2017-01-21 ENCOUNTER — Encounter: Payer: Self-pay | Admitting: Cardiovascular Disease

## 2017-01-21 ENCOUNTER — Ambulatory Visit (INDEPENDENT_AMBULATORY_CARE_PROVIDER_SITE_OTHER): Payer: BLUE CROSS/BLUE SHIELD | Admitting: Cardiovascular Disease

## 2017-01-21 VITALS — BP 116/72 | HR 81 | Ht 71.0 in | Wt 278.0 lb

## 2017-01-21 DIAGNOSIS — I5032 Chronic diastolic (congestive) heart failure: Secondary | ICD-10-CM | POA: Diagnosis not present

## 2017-01-21 DIAGNOSIS — Z950 Presence of cardiac pacemaker: Secondary | ICD-10-CM

## 2017-01-21 DIAGNOSIS — G4733 Obstructive sleep apnea (adult) (pediatric): Secondary | ICD-10-CM | POA: Diagnosis not present

## 2017-01-21 DIAGNOSIS — I481 Persistent atrial fibrillation: Secondary | ICD-10-CM | POA: Diagnosis not present

## 2017-01-21 DIAGNOSIS — I4819 Other persistent atrial fibrillation: Secondary | ICD-10-CM

## 2017-01-21 LAB — CUP PACEART INCLINIC DEVICE CHECK
Brady Statistic RA Percent Paced: 0 %
Implantable Lead Implant Date: 20041025
Implantable Lead Implant Date: 20041025
Implantable Lead Location: 753859
Implantable Lead Model: 4480
Implantable Lead Serial Number: 338209
Implantable Pulse Generator Implant Date: 20110712
Lead Channel Pacing Threshold Amplitude: 0.8 V
Lead Channel Pacing Threshold Pulse Width: 0.4 ms
Lead Channel Sensing Intrinsic Amplitude: 9.2 mV
MDC IDC LEAD LOCATION: 753860
MDC IDC LEAD SERIAL: 424134
MDC IDC MSMT LEADCHNL RV IMPEDANCE VALUE: 600 Ohm
MDC IDC PG SERIAL: 598266
MDC IDC SESS DTM: 20180523040000
MDC IDC SET LEADCHNL RV PACING AMPLITUDE: 2.4 V
MDC IDC SET LEADCHNL RV PACING PULSEWIDTH: 0.4 ms
MDC IDC SET LEADCHNL RV SENSING SENSITIVITY: 2.5 mV
MDC IDC STAT BRADY RV PERCENT PACED: 17 %

## 2017-01-21 MED ORDER — DIGOXIN 125 MCG PO TABS
0.1250 mg | ORAL_TABLET | Freq: Every day | ORAL | 3 refills | Status: DC
Start: 2017-01-21 — End: 2017-07-08

## 2017-01-21 NOTE — Progress Notes (Signed)
Patient ID: Shane [name], male   DOB: 1946/02/19, 70 y.o.   MRN: [id number]     Cardiology Office Note    Date:  01/22/2017   [name], Shane [name] 02/18/1946, MRN [id number]  PCP:  Shane Billet, MD  Cardiologist:   Shane Klein, MD   Chief Complaint  Patient presents with  . Follow-up    lack of energy, DOE    History of Present Illness:  Shane [name] is a 71 y.o. male who presents for persistent atrial fibrillation and pacemaker check.  He generally feels "okay" although he does have less energy than he did in the past. He has not had syncope or presyncope. He denies exertional dyspnea or angina and is not aware of palpitations. He has not had leg edema. Interrogation of his pacemaker shows that he has 17% ventricular pacing that his heart rate is often over 100 bpm reaching as fast as 150 bpm. Ventricular rate remains inadequate.  Earlier this year we had to cut back his diltiazem due to symptomatic hypotension. He is currently taking diltiazem 240 mg daily and metoprolol 50 mg daily. His blood pressure is normal, but he states that at home the systolic blood pressure is often in the low 100s occasionally in the 90s.  He has now been in uninterrupted atrial fibrillation since January 24. Attempted cardioversion on February 20 failed after 2 shocks at 200 J. Pedis echo on March 8 shows normal left ventricular systolic function with EF 50-55%. There was mild LVH. The left atrium was described as mildly dilated, but measured at 5.2 cm end-systolic diameter (which would be severely dilated), and systolic volume index 33 mL/meter squared (mildly dilated).  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 His device is now programmed VVIR due to persistent atrial fibrillation  Past Medical History:  Diagnosis Date  . Atrioventricular block   . Degenerative joint disease   . Paroxysmal atrial fibrillation (HCC)   . Presence of  permanent cardiac pacemaker 03/12/10   guidant  . Sinus node dysfunction [facility])     Past Surgical History:  Procedure Laterality Date  . CARDIAC CATHETERIZATION  10/13/1994   No CAD  . CARDIAC CATHETERIZATION  05/10/2003   No CAD  . CARDIOVERSION N/A 10/21/2016   Procedure: CARDIOVERSION;  Surgeon: Shane Klein, MD;  Location: [place];  Service: Cardiovascular;  Laterality: N/A;  . NM [place]  05/07/2011   Lexiscan: No ishcemia  . PERMANENT PACEMAKER INSERTION  03/12/2010   guidant  . ROTATOR CUFF REPAIR  1996  . US ECHOCARDIOGRAPHY  05/07/2011   mod. LVH,LA mod. dilated,borderline aortic root dilatation    Outpatient Medications Prior to Visit  Medication Sig Dispense Refill  . Ascorbic Acid (VITAMIN C) 1000 MG tablet Take 1,000 mg by mouth daily.    . B Complex-C-Folic Acid (STRESS FORMULA) TABS Take by mouth daily.    . Calcium Citrate (CITRACAL PO) Take by mouth daily.    . celecoxib (CELEBREX) 200 MG capsule Take 1 capsule (200 mg total) by mouth daily. 30 capsule 6  . Cholecalciferol (VITAMIN D-3) 1000 UNITS CAPS Take 1 capsule by mouth daily.    . citalopram (CELEXA) 40 MG tablet Take 1 tablet (40 mg total) by mouth daily. MUST RECEIVE FUTURE REFILLS FROM PCP. 30 tablet 0  . diltiazem (CARDIZEM CD) 240 MG 24 hr capsule Take 1 capsule (240 mg total) by mouth daily. 30 capsule 10  . doxazosin (  CARDURA) 4 MG tablet Take 4 mg by mouth at bedtime.    Marland Kitchen ELIQUIS 5 MG TABS tablet Take 1 tablet (5 mg total) by mouth 2 (two) times daily. 60 tablet 9  . furosemide (LASIX) 40 MG tablet Take 1 tablet (40 mg total) by mouth daily. 30 tablet 9  . indomethacin (INDOCIN) 50 MG capsule Take 50 mg by mouth as needed (Gout).    . metoprolol succinate (TOPROL-XL) 50 MG 24 hr tablet Take 1 tablet (50 mg total) by mouth daily. 30 tablet 11  . montelukast (SINGULAIR) 10 MG tablet Take 10 mg by mouth at bedtime.    . Omega-3 Fatty Acids (FISH OIL) 1200 MG CAPS Take 1,200 mg by mouth 2  (two) times daily.     Marland Kitchen testosterone (ANDROGEL) 50 MG/5GM GEL Place 5 g onto the skin daily.    . vitamin B-12 (CYANOCOBALAMIN) 1000 MCG tablet Take 1,000 mcg by mouth daily.    Marland Kitchen glucosamine-chondroitin 500-400 MG tablet Take 1 tablet by mouth 2 (two) times daily.      No facility-administered medications prior to visit.      Allergies:   Patient has no known allergies.   Social History   Social History  . Marital status: Married    Spouse name: N/A  . Number of children: N/A  . Years of education: N/A   Social History Main Topics  . Smoking status: Former Smoker    Types: Cigars  . Smokeless tobacco: Never Used  . Alcohol use Yes     Comment: social  . Drug use: No  . Sexual activity: Not Asked   Other Topics Concern  . None   Social History Narrative  . None     Family History:  The patient's family history includes Heart failure in his mother; Stroke in his mother.   ROS:   Please see the history of present illness.    ROS All other systems reviewed and are negative.   PHYSICAL EXAM:   VS:  BP 116/72   Pulse 81   Ht 5\' 11"  (1.803 m)   Wt 278 lb (126.1 kg)   BMI 38.77 kg/m     General: Alert, oriented x3, no distress. Severely obese Head: no evidence of trauma, PERRL, EOMI, no exophtalmos or lid lag, no myxedema, no xanthelasma; normal ears, nose and oropharynx Neck: normal jugular venous pulsations and no hepatojugular reflux; brisk carotid pulses without delay and no carotid bruits Chest: clear to auscultation, no signs of consolidation by percussion or palpation, normal fremitus, symmetrical and full respiratory excursions. Healthy left subclavian pacemaker site Cardiovascular: normal position and quality of the apical impulse, regular rhythm, normal first and second heart sounds, no murmurs, rubs or gallops Abdomen: no tenderness or distention, no masses by palpation, no abnormal pulsatility or arterial bruits, normal bowel sounds, no  hepatosplenomegaly Extremities: no clubbing, cyanosis or edema; 2+ radial, ulnar and brachial pulses bilaterally; 2+ right femoral, posterior tibial and dorsalis pedis pulses; 2+ left femoral, posterior tibial and dorsalis pedis pulses; no subclavian or femoral bruits Neurological: grossly nonfocal  Psych: euthymic mood, full affect  Wt Readings from Last 3 Encounters:  01/21/17 278 lb (126.1 kg)  10/21/16 284 lb (128.8 kg)  10/01/16 284 lb (128.8 kg)      Studies/Labs Reviewed:   EKG:  EKG is ordered today.  The ekg ordered today demonstrates atrial fibrillation with occasional paced ventricular beats (roughly 50/50 sensed/paced)   ASSESSMENT:    1. Persistent atrial  fibrillation ([place])   2. Pacemaker for sinus node dysfunction   3. Chronic diastolic CHF (congestive heart failure) ([place])   4. OSA (obstructive sleep apnea)   5. Obesity, morbid (more than 100 lbs over ideal weight or BMI > 40) (HCC)      PLAN:  In order of problems listed above:  1. AFib:  Rate control remains inadequate. This probably explains his fatigue and poor exercise tolerance. He is on the maximum tolerated doses of beta blocker and calcium channel blocker, due to his blood pressure. Will add a little bit of digoxin, but I'm not optimistic that this will solve the problem. Discussed alternative ways of managing atrial fibrillation. He inquires about potential for return to sinus rhythm and we discussed ablation. I reviewed the fact that his obesity and the persistent nature of the arrhythmia both make the odds of long-term success lower. Another option would be to try cardioversion again, after we start a medication that might lower his defibrillation threshold such as Tikosyn or sotalol. Finally, if we fail with both rate control and rhythm control strategies, there is always the option to proceed with AV node ablation although this would make him device dependent. To sort through these different options I have  recommended referral to one of our electrophysiologists who performs A. fib ablations.  2. PM: initially implanted for sinus node dysfunction.Now programmed VVIR since it appears that the atrial fibrillation may be permanent. We could always switch back to DDD if we choose to change our strategy to rhythm control. Continue downloads Q 3 months. 3. CHF: He has lost 6 pounds since his last appointment it as far as I can tell he is clinically euvolemic. On his echo the inferior vena cava was not dilated in March. Does not appear to be in overt heart failure  4. OSA: As before, he reports 100% compliance with CPAP 5. Obesity: Long term, weight reduction would be highly beneficial, not only for arrhythmia control but also for many other potential health benefits.    Medication Adjustments/Labs and Tests Ordered: Current medicines are reviewed at length with the patient today.  Concerns regarding medicines are outlined above.  Medication changes, Labs and Tests ordered today are listed in the Patient Instructions below. Patient Instructions  Dr Sallyanne Kuster has recommended making the following medication changes: 1. START Digoxin 0.125 mg - take 1 tablet by mouth daily  You have been referred to an electrophysiologist, Dr Curt Bears, to discuss treatment options for atrial fibrillation.  Dr Sallyanne Kuster recommends that you schedule a follow-up appointment in 3 months with a pacemaker.  If you need a refill on your cardiac medications before your next appointment, please call your pharmacy.      Signed, Shane Klein, MD  01/22/2017 6:15 PM    [facility] [place], [place], [place]  [address] Phone: [phone]; Fax: [phone]

## 2017-01-21 NOTE — Patient Instructions (Signed)
Dr Sallyanne Kuster has recommended making the following medication changes: 1. START Digoxin 0.125 mg - take 1 tablet by mouth daily  You have been referred to an electrophysiologist, Dr Curt Bears, to discuss treatment options for atrial fibrillation.  Dr Sallyanne Kuster recommends that you schedule a follow-up appointment in 3 months with a pacemaker.  If you need a refill on your cardiac medications before your next appointment, please call your pharmacy.

## 2017-01-28 ENCOUNTER — Encounter: Payer: Self-pay | Admitting: Cardiology

## 2017-01-31 NOTE — Addendum Note (Signed)
Addendum  created 01/31/17 0949 by Duane Boston, MD   Sign clinical note

## 2017-02-10 ENCOUNTER — Other Ambulatory Visit: Payer: Self-pay | Admitting: Cardiology

## 2017-02-10 ENCOUNTER — Encounter: Payer: Self-pay | Admitting: *Deleted

## 2017-02-10 ENCOUNTER — Encounter: Payer: Self-pay | Admitting: Cardiology

## 2017-02-10 ENCOUNTER — Ambulatory Visit (INDEPENDENT_AMBULATORY_CARE_PROVIDER_SITE_OTHER): Payer: BLUE CROSS/BLUE SHIELD | Admitting: Cardiology

## 2017-02-10 VITALS — BP 110/80 | HR 74 | Ht 71.0 in | Wt 281.0 lb

## 2017-02-10 DIAGNOSIS — I5032 Chronic diastolic (congestive) heart failure: Secondary | ICD-10-CM

## 2017-02-10 DIAGNOSIS — I4819 Other persistent atrial fibrillation: Secondary | ICD-10-CM

## 2017-02-10 DIAGNOSIS — G4733 Obstructive sleep apnea (adult) (pediatric): Secondary | ICD-10-CM | POA: Diagnosis not present

## 2017-02-10 DIAGNOSIS — I481 Persistent atrial fibrillation: Secondary | ICD-10-CM | POA: Diagnosis not present

## 2017-02-10 NOTE — Addendum Note (Signed)
Addended by: Stanton Kidney on: 02/10/2017 01:24 PM   Modules accepted: Orders

## 2017-02-10 NOTE — Progress Notes (Signed)
Electrophysiology Office Note   Date:  02/10/2017   [name], [name] 11-Mar-1946, MRN [id number]  PCP:  Albina Billet, MD  Cardiologist:  Croitrou Primary Electrophysiologist:  Cheryll Keisler Meredith Leeds, MD    Chief Complaint  Patient presents with  . Follow-up    Persistent Afib     History of Present Illness: Shane [name] is a 71 y.o. male who is being seen today for the evaluation of atrial fibrillation at the request of Albina Billet, MD. Presenting today for electrophysiology evaluation. He presents for evaluation of persistent atrial fibrillation. He has a pacemaker in place that shows 17% ventricular pacing and heart rate that is often over 100 bpm which is passes 676 bpm. His systolic blood pressures have been low in the 100s to 90s, and thus his diltiazem has been decreased. He has been in atrial fibrillation since January 24. Attempted cardioversion on February 20 failed after 2 shocks. Echo on March 8 showed a normal ejection fraction with mild LVH. Left atrium was severely dilated. 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    Today, he denies symptoms of palpitations, chest pain, shortness of breath, orthopnea, PND, lower extremity edema, claudication, dizziness, presyncope, syncope, bleeding, or neurologic sequela. The patient is tolerating medications without difficulties. His main symptoms are of fatigue and shortness of breath. He has been sleeping quite a bit more than usual. He also has difficulty with some of his daily activities due to his fatigue.   Past Medical History:  Diagnosis Date  . Atrioventricular block   . Degenerative joint disease   . Paroxysmal atrial fibrillation (HCC)   . 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 Sinus node dysfunction [facility])    Past Surgical History:  Procedure Laterality Date  . CARDIAC CATHETERIZATION  10/13/1994   No CAD  . CARDIAC CATHETERIZATION  05/10/2003   No CAD  .  CARDIOVERSION N/A 10/21/2016   Procedure: CARDIOVERSION;  Surgeon: Sanda Klein, MD;  Location: [place];  Service: Cardiovascular;  Laterality: N/A;  . NM [place]  05/07/2011   Lexiscan: No ishcemia  . PERMANENT PACEMAKER INSERTION  03/12/2010   guidant  . ROTATOR CUFF REPAIR  1996  . US ECHOCARDIOGRAPHY  05/07/2011   mod. LVH,LA mod. dilated,borderline aortic root dilatation     Current Outpatient Prescriptions  Medication Sig Dispense Refill  . Ascorbic Acid (VITAMIN C) 1000 MG tablet Take 1,000 mg by mouth daily.    . B Complex-C-Folic Acid (STRESS FORMULA) TABS Take by mouth daily.    . Calcium Citrate (CITRACAL PO) Take by mouth daily.    . celecoxib (CELEBREX) 200 MG capsule Take 1 capsule (200 mg total) by mouth daily. 30 capsule 6  . Cholecalciferol (VITAMIN D-3) 1000 UNITS CAPS Take 1 capsule by mouth daily.    . citalopram (CELEXA) 40 MG tablet Take 1 tablet (40 mg total) by mouth daily. MUST RECEIVE FUTURE REFILLS FROM PCP. 30 tablet 0  . digoxin (LANOXIN) 0.125 MG tablet Take 1 tablet (0.125 mg total) by mouth daily. 90 tablet 3  . diltiazem (CARDIZEM CD) 240 MG 24 hr capsule Take 1 capsule (240 mg total) by mouth daily. 30 capsule 10  . doxazosin (CARDURA) 4 MG tablet Take 4 mg by mouth at bedtime.    Marland Kitchen ELIQUIS 5 MG TABS tablet Take 1 tablet (5 mg total) by mouth 2 (two) times daily. 60 tablet 9  . furosemide (LASIX)  40 MG tablet Take 1 tablet (40 mg total) by mouth daily. 30 tablet 9  . indomethacin (INDOCIN) 50 MG capsule Take 50 mg by mouth as needed (Gout).    . metoprolol succinate (TOPROL-XL) 50 MG 24 hr tablet Take 1 tablet (50 mg total) by mouth daily. 30 tablet 11  . montelukast (SINGULAIR) 10 MG tablet Take 10 mg by mouth at bedtime.    . Omega-3 Fatty Acids (FISH OIL) 1200 MG CAPS Take 1,200 mg by mouth 2 (two) times daily.     Marland Kitchen testosterone (ANDROGEL) 50 MG/5GM GEL Place 5 g onto the skin daily.    . vitamin B-12 (CYANOCOBALAMIN) 1000 MCG  tablet Take 1,000 mcg by mouth daily.     No current facility-administered medications for this visit.     Allergies:   Patient has no known allergies.   Social History:  The patient  reports that he has quit smoking. His smoking use included Cigars. He has never used smokeless tobacco. He reports that he drinks alcohol. He reports that he does not use drugs.   Family History:  The patient's family history includes Heart failure in his mother; Stroke in his mother.    ROS:  Please see the history of present illness.   Otherwise, review of systems is positive for fatigue, hearing loss, visual changes, dyspnea on exertion, back pain, dizziness.   All other systems are reviewed and negative.    PHYSICAL EXAM: VS:  BP 110/80   Pulse 74   Ht 5\' 11"  (1.803 m)   Wt 281 lb (127.5 kg)   BMI 39.19 kg/m  , BMI Body mass index is 39.19 kg/m. GEN: Well nourished, well developed, in no acute distress  HEENT: normal  Neck: no JVD, carotid bruits, or masses Cardiac: iRRR; no murmurs, rubs, or gallops,no edema  Respiratory:  clear to auscultation bilaterally, normal work of breathing GI: soft, nontender, nondistended, + BS MS: no deformity or atrophy  Skin: warm and dry, device pocket is well healed Neuro:  Strength and sensation are intact Psych: euthymic mood, full affect  EKG:  EKG is not ordered today. Personal review of the ekg ordered 10/06/16 shows atrial fibrillation, intermittent V pacing   Device interrogation is reviewed today in detail.  See PaceArt for details.   Recent Labs: 10/14/2016: BUN 14; Creat 1.02; Hemoglobin 16.9; Platelets 190; Potassium 4.6; Sodium 142; TSH 1.63    Lipid Panel  No results found for: CHOL, TRIG, HDL, CHOLHDL, VLDL, LDLCALC, LDLDIRECT   Wt Readings from Last 3 Encounters:  02/10/17 281 lb (127.5 kg)  01/21/17 278 lb (126.1 kg)  10/21/16 284 lb (128.8 kg)      Other studies Reviewed: Additional studies/ records that were reviewed today  include: TTE 11/06/16  Review of the above records today demonstrates:  - Left ventricle: Septal hypokinesis. The cavity size was   moderately dilated. Wall thickness was increased in a pattern of   mild LVH. Systolic function was normal. The estimated ejection   fraction was in the range of 50% to 55%. - Aortic valve: There was trivial regurgitation. - Left atrium: The atrium was mildly dilated.   ASSESSMENT AND PLAN:  1.  Persistent atrial fibrillation: Currently on Eliquis. Has had increased rates on his pacemaker due to atrial fibrillation. Blood pressure has not tolerated titration of rate controlling medications. Due to his symptoms, a rhythm control strategy would be ideal. I discussed with him options of Tikosyn, amiodarone and ablation. Risks and benefits  of ablation were discussed. Risks include bleeding, tamponade, heart block, stroke, and damage to surrounding organs. He understands these risks and has agreed to the procedure. I did quote him a 60-65% chance of success with a single ablative procedure. I also told him that he may need antiarrhythmic medications down the road.  This patients CHA2DS2-VASc Score and unadjusted Ischemic Stroke Rate (% per year) is equal to 2.2 % stroke rate/year from a score of 2  Above score calculated as 1 point each if present [CHF, HTN, DM, Vascular=MI/PAD/Aortic Plaque, Age if 65-74, or Male] Above score calculated as 2 points each if present [Age > 75, or Stroke/TIA/TE]   2. 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   3. Chronic diastolic heart failure: No signs of volume overload  4. OSA: compliant with CPAP  5. Morbid obesity: weight loss encouraged  Current medicines are reviewed at length with the patient today.   The patient does not have concerns regarding his medicines.  The following changes were made today:  none  Labs/ tests ordered today include:  No orders of the defined  types were placed in this encounter.    Disposition:   FU with Arley Garant 6 weeks  Signed, Rhyland Hinderliter Meredith Leeds, MD  02/10/2017 10:17 AM     [address] [place] [address] [phone] (office) [phone] (fax)

## 2017-02-10 NOTE — Patient Instructions (Addendum)
Medication Instructions:    Your physician recommends that you continue on your current medications as directed. Please refer to the Current Medication list given to you today.  - If you need a refill on your cardiac medications before your next appointment, please call your pharmacy.   Labwork:  None ordered  Testing/Procedures:  Your physician has requested that you have cardiac CT - you will have this within 7 days prior to your procedure on 04/03/17. Cardiac computed tomography (CT) is a painless test that uses an x-ray machine to take clear, detailed pictures of your heart. For further information please visit [email or web address]. Please follow instruction sheet as given.  Your physician has recommended that you have an ablation. Catheter ablation is a medical procedure used to treat some cardiac arrhythmias (irregular heartbeats). During catheter ablation, a long, thin, flexible tube is put into a blood vessel in your groin (upper thigh), or neck. This tube is called an ablation catheter. It is then guided to your heart through the blood vessel. Radio frequency waves destroy small areas of heart tissue where abnormal heartbeats may cause an arrhythmia to start. Please see the instruction sheet given to you today.  Follow-Up:  Your physician recommends that you schedule a follow-up appointment between weeks of 03/20/17 - 04/01/2017 with Dr. Curt Bears for history & physical and lab work before your procedure.   Your physician recommends that you schedule a follow-up appointment in: 4 weeks, after your procedure on 04/03/2017, with Roderic Palau in the AFib clinic.   Your physician recommends that you schedule a follow-up appointment in: 3 months, after your procedure on 04/03/2017, with Dr. Curt Bears.  Thank you for choosing [facility]!!   Trinidad Curet, RN [phone]  Any Other Special Instructions Will Be Listed Below (If Applicable).   Cardiac Ablation Cardiac ablation is a  procedure to stop some heart tissue from causing problems. The heart has many electrical connections. Sometimes these connections make the heart beat very fast or irregularly. Removing some problem areas can improve the heart rhythm or make it normal. What happens before the procedure?  Follow instructions from your doctor about what you cannot eat or drink.  Ask your doctor about: ? Changing or stopping your normal medicines. This is important if you take diabetes medicines or blood thinners. ? Taking medicines such as aspirin and ibuprofen. These medicines can thin your blood. Do not take these medicines before your procedure if your doctor tells you not to.  Plan to have someone take you home.  If you will be going home right after the procedure, plan to have someone with you for 24 hours. What happens during the procedure?  To lower your risk of infection: ? Your health care team will wash or sanitize their hands. ? Your skin will be washed with soap. ? Hair may be removed from your neck or groin.  An IV tube will be put into one of your veins.  You will be given a medicine to help you relax (sedative).  Skin on your neck or groin will be numbed.  A cut (incision) will be made in your neck or groin.  A needle will be put through your cut and into a vein in your neck or groin.  A tube (catheter) will be put into the needle. The tube will be moved to your heart. X-rays (fluoroscopy) will be used to help guide the tube.  Small devices (electrodes) on the tip of the tube will send out electrical currents.  Dye may be put through the tube. This helps your surgeon see your heart.  Electrical energy will be used to scar (ablate) some heart tissue. Your surgeon may use: ? Heat (radiofrequency energy). ? Laser energy. ? Extreme cold (cryoablation).  The tube will be taken out.  Pressure will be held on your cut. This helps stop bleeding.  A bandage (dressing) will be put on  your cut. The procedure may vary. What happens after the procedure?  You will be monitored until your medicines have worn off.  Your cut will be watched for bleeding. You will need to lie still for a few hours.  Do not drive for 24 hours or as long as your doctor tells you. Summary  Cardiac ablation is a procedure to stop some heart tissue from causing problems.  Electrical energy will be used to scar (ablate) some heart tissue. This information is not intended to replace advice given to you by your health care provider. Make sure you discuss any questions you have with your health care provider. Document Released: 04/20/2013 Document Revised: 07/07/2016 Document Reviewed: 07/07/2016 Elsevier Interactive Patient Education  2017 [employer].

## 2017-02-18 ENCOUNTER — Telehealth: Payer: Self-pay | Admitting: *Deleted

## 2017-02-18 NOTE — Telephone Encounter (Signed)
Equipment order for:  A7030 pap full face mask 1 per 3 months A7031 pap full face mask cushion 1 per 1 month A7035 pap headgear 1 per 6 months A4604 pap tubing-heated 1 per 3 months A7039 pap filter-non disposable 1 per 6 months  Faxed to the number provided.

## 2017-03-22 NOTE — Progress Notes (Signed)
Electrophysiology Office Note   Date:  03/23/2017   [name], Shane [name] [birth year], MRN [id number]  PCP:  Albina Billet, MD  Cardiologist:  Croitrou Primary Electrophysiologist:  Jaymason Ledesma Meredith Leeds, MD    Chief Complaint  Patient presents with  . Follow-up    Persistent Afib     History of Present Illness: Shane [name] is a 71 y.o. male who is being seen today for the evaluation of atrial fibrillation at the request of Albina Billet, MD. Presenting today for electrophysiology evaluation. He presents for evaluation of persistent atrial fibrillation. He has a pacemaker in place that shows 17% ventricular pacing. He has been in atrial fibrillation since January 24. Attempted cardioversion on February 20 failed after 2 shocks. Left atrium was severely dilated. 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  Today, denies symptoms of palpitations, chest pain, shortness of breath, orthopnea, PND, lower extremity edema, claudication, dizziness, presyncope, syncope, bleeding, or neurologic sequela. The patient is tolerating medications without difficulties and is otherwise without complaint today. Scheduled for AF ablation 04/03/17.   Past Medical History:  Diagnosis Date  . Atrioventricular block   . Degenerative joint disease   . Paroxysmal atrial fibrillation (HCC)   . 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 Sinus node dysfunction [facility])    Past Surgical History:  Procedure Laterality Date  . CARDIAC CATHETERIZATION  10/13/1994   No CAD  . CARDIAC CATHETERIZATION  05/10/2003   No CAD  . CARDIOVERSION N/A 10/21/2016   Procedure: CARDIOVERSION;  Surgeon: Sanda Klein, MD;  Location: [place];  Service: Cardiovascular;  Laterality: N/A;  . NM [place]  05/07/2011   Lexiscan: No ishcemia  . PERMANENT PACEMAKER INSERTION  03/12/2010   guidant  . ROTATOR CUFF REPAIR  1996  . US ECHOCARDIOGRAPHY  05/07/2011   mod.  LVH,LA mod. dilated,borderline aortic root dilatation     Current Outpatient Prescriptions  Medication Sig Dispense Refill  . Ascorbic Acid (VITAMIN C) 1000 MG tablet Take 1,000 mg by mouth daily.    . B Complex-C-Folic Acid (STRESS FORMULA) TABS Take by mouth daily.    . Calcium Citrate (CITRACAL PO) Take by mouth daily.    . celecoxib (CELEBREX) 200 MG capsule Take 1 capsule (200 mg total) by mouth daily. 30 capsule 6  . Cholecalciferol (VITAMIN D-3) 1000 UNITS CAPS Take 1 capsule by mouth daily.    . citalopram (CELEXA) 40 MG tablet Take 1 tablet (40 mg total) by mouth daily. MUST RECEIVE FUTURE REFILLS FROM PCP. 30 tablet 0  . digoxin (LANOXIN) 0.125 MG tablet Take 1 tablet (0.125 mg total) by mouth daily. 90 tablet 3  . diltiazem (CARDIZEM CD) 240 MG 24 hr capsule Take 1 capsule (240 mg total) by mouth daily. 30 capsule 10  . doxazosin (CARDURA) 4 MG tablet Take 4 mg by mouth at bedtime.    Marland Kitchen ELIQUIS 5 MG TABS tablet Take 1 tablet (5 mg total) by mouth 2 (two) times daily. 60 tablet 9  . furosemide (LASIX) 40 MG tablet Take 1 tablet (40 mg total) by mouth daily. 30 tablet 9  . indomethacin (INDOCIN) 50 MG capsule Take 50 mg by mouth as needed (Gout).    . metoprolol succinate (TOPROL-XL) 50 MG 24 hr tablet Take 1 tablet (50 mg total) by mouth daily. 30 tablet 11  . montelukast (SINGULAIR) 10 MG tablet Take 10 mg by mouth at bedtime.    Marland Kitchen  Omega-3 Fatty Acids (FISH OIL) 1200 MG CAPS Take 1,200 mg by mouth 2 (two) times daily.     Marland Kitchen testosterone (ANDROGEL) 50 MG/5GM GEL Place 5 g onto the skin daily.    . vitamin B-12 (CYANOCOBALAMIN) 1000 MCG tablet Take 1,000 mcg by mouth daily.     No current facility-administered medications for this visit.     Allergies:   Patient has no known allergies.   Social History:  The patient  reports that he has quit smoking. His smoking use included Cigars. He has never used smokeless tobacco. He reports that he drinks alcohol. He reports that he does  not use drugs.   Family History:  The patient's family history includes Heart failure in his mother; Stroke in his mother.   ROS:  Please see the history of present illness.   Otherwise, review of systems is positive for DOE muscle pain.   All other systems are reviewed and negative.   PHYSICAL EXAM: VS:  BP 124/68   Pulse 80   Ht 5\' 11"  (1.803 m)   Wt 279 lb 3.2 oz (126.6 kg)   SpO2 97%   BMI 38.94 kg/m  , BMI Body mass index is 38.94 kg/m. GEN: Well nourished, well developed, in no acute distress  HEENT: normal  Neck: no JVD, carotid bruits, or masses Cardiac: RRR; no murmurs, rubs, or gallops,no edema  Respiratory:  clear to auscultation bilaterally, normal work of breathing GI: soft, nontender, nondistended, + BS MS: no deformity or atrophy  Skin: warm and dry, device site well healed Neuro:  Strength and sensation are intact Psych: euthymic mood, full affect  EKG:  EKG is not ordered today. Personal review of the ekg ordered 10/06/16 shows AF, V paicing  Recent Labs: 10/14/2016: BUN 14; Creat 1.02; Hemoglobin 16.9; Platelets 190; Potassium 4.6; Sodium 142; TSH 1.63    Lipid Panel  No results found for: CHOL, TRIG, HDL, CHOLHDL, VLDL, LDLCALC, LDLDIRECT   Wt Readings from Last 3 Encounters:  03/23/17 279 lb 3.2 oz (126.6 kg)  02/10/17 281 lb (127.5 kg)  01/21/17 278 lb (126.1 kg)      Other studies Reviewed: Additional studies/ records that were reviewed today include: TTE 11/06/16  Review of the above records today demonstrates:  - Left ventricle: Septal hypokinesis. The cavity size was   moderately dilated. Wall thickness was increased in a pattern of   mild LVH. Systolic function was normal. The estimated ejection   fraction was in the range of 50% to 55%. - Aortic valve: There was trivial regurgitation. - Left atrium: The atrium was mildly dilated.   ASSESSMENT AND PLAN:  1.  Persistent atrial fibrillation: On Eliquis. AF ablation scheduled 04/03/17. Risks  and benefits of ablation of the discussed. Risks include bleeding, tamponade, heart block, stroke, and damage to surrounding organs among others. He understands these risks and has agreed to the procedure.  This patients CHA2DS2-VASc Score and unadjusted Ischemic Stroke Rate (% per year) is equal to 2.2 % stroke rate/year from a score of 2  Above score calculated as 1 point each if present [CHF, HTN, DM, Vascular=MI/PAD/Aortic Plaque, Age if 65-74, or Male] Above score calculated as 2 points each if present [Age > 75, or Stroke/TIA/TE]   2. Tachy/brady syndrome: That is post dual chamber pacemaker set to VVI. We'll plan to change the settings post ablation.  3. Chronic diastolic heart failure: No signs of volume overload  4. OSA: Compliant with CPAP  5. Morbid obesity:  Weight loss encouraged  Current medicines are reviewed at length with the patient today.   The patient does not have concerns regarding his medicines.  The following changes were made today:    Labs/ tests ordered today include:  Orders Placed This Encounter  Procedures  . Basic Metabolic Panel (BMET)  . CBC w/Diff    Disposition:   FU with Zoie Sarin 3 months  Signed, Rohn Fritsch Meredith Leeds, MD  03/23/2017 11:01 AM     [facility] [address] [place] [address] [phone] (office) [phone] (fax)

## 2017-03-23 ENCOUNTER — Encounter (INDEPENDENT_AMBULATORY_CARE_PROVIDER_SITE_OTHER): Payer: Self-pay

## 2017-03-23 ENCOUNTER — Encounter: Payer: Self-pay | Admitting: Cardiology

## 2017-03-23 ENCOUNTER — Ambulatory Visit (INDEPENDENT_AMBULATORY_CARE_PROVIDER_SITE_OTHER): Payer: BLUE CROSS/BLUE SHIELD | Admitting: Cardiology

## 2017-03-23 VITALS — BP 124/68 | HR 80 | Ht 71.0 in | Wt 279.2 lb

## 2017-03-23 DIAGNOSIS — I495 Sick sinus syndrome: Secondary | ICD-10-CM | POA: Diagnosis not present

## 2017-03-23 DIAGNOSIS — Z01812 Encounter for preprocedural laboratory examination: Secondary | ICD-10-CM | POA: Diagnosis not present

## 2017-03-23 DIAGNOSIS — I481 Persistent atrial fibrillation: Secondary | ICD-10-CM | POA: Diagnosis not present

## 2017-03-23 DIAGNOSIS — I5032 Chronic diastolic (congestive) heart failure: Secondary | ICD-10-CM

## 2017-03-23 DIAGNOSIS — G4733 Obstructive sleep apnea (adult) (pediatric): Secondary | ICD-10-CM

## 2017-03-23 DIAGNOSIS — I4819 Other persistent atrial fibrillation: Secondary | ICD-10-CM

## 2017-03-23 LAB — CBC WITH DIFFERENTIAL/PLATELET
BASOS ABS: 0 10*3/uL (ref 0.0–0.2)
BASOS: 0 %
EOS (ABSOLUTE): 0 10*3/uL (ref 0.0–0.4)
Eos: 1 %
HEMOGLOBIN: 16.4 g/dL (ref 13.0–17.7)
Hematocrit: 45.7 % (ref 37.5–51.0)
LYMPHS: 25 %
Lymphocytes Absolute: 1.6 10*3/uL (ref 0.7–3.1)
MCH: 33.3 pg — ABNORMAL HIGH (ref 26.6–33.0)
MCHC: 35.9 g/dL — AB (ref 31.5–35.7)
MCV: 93 fL (ref 79–97)
MONOCYTES: 6 %
Monocytes Absolute: 0.4 10*3/uL (ref 0.1–0.9)
NEUTROS ABS: 4.2 10*3/uL (ref 1.4–7.0)
NEUTROS PCT: 68 %
Platelets: 226 10*3/uL (ref 150–379)
RBC: 4.93 x10E6/uL (ref 4.14–5.80)
RDW: 14 % (ref 12.3–15.4)
WBC: 6.2 10*3/uL (ref 3.4–10.8)

## 2017-03-23 LAB — BASIC METABOLIC PANEL
BUN/Creatinine Ratio: 14 (ref 10–24)
BUN: 15 mg/dL (ref 8–27)
CALCIUM: 10 mg/dL (ref 8.6–10.2)
CO2: 33 mmol/L — AB (ref 20–29)
Chloride: 99 mmol/L (ref 96–106)
Creatinine, Ser: 1.09 mg/dL (ref 0.76–1.27)
GFR, EST AFRICAN AMERICAN: 78 mL/min/{1.73_m2} (ref >59)
GFR, EST NON AFRICAN AMERICAN: 67 mL/min/{1.73_m2} (ref >59)
Glucose: 54 mg/dL — ABNORMAL LOW (ref 65–99)
POTASSIUM: 4.7 mmol/L (ref 3.5–5.2)
Sodium: 139 mmol/L (ref 134–144)

## 2017-03-23 NOTE — Patient Instructions (Signed)
Medication Instructions:    Your physician recommends that you continue on your current medications as directed. Please refer to the Current Medication list given to you today.  - If you need a refill on your cardiac medications before your next appointment, please call your pharmacy.   Labwork:  Pre procedure labs today: BMET & CBC w/ diff  Testing/Procedures:  None ordered  Follow-Up:  Your physician recommends that you schedule a follow-up appointment in: 4 weeks, after your procedure on 04/03/2017, with Roderic Palau in the AFib clinic.   Your physician recommends that you schedule a follow-up appointment in: 3 months, after your procedure on 04/03/2017, with Dr. Curt Bears  Thank you for choosing [facility]!!   Trinidad Curet, RN [phone]  Any Other Special Instructions Will Be Listed Below (If Applicable).

## 2017-03-31 ENCOUNTER — Ambulatory Visit (HOSPITAL_COMMUNITY)
Admission: RE | Admit: 2017-03-31 | Discharge: 2017-03-31 | Disposition: A | Discharge status: Home / Self Care | Payer: BLUE CROSS/BLUE SHIELD | Source: Ambulatory Visit | Attending: Cardiology | Admitting: Cardiology

## 2017-03-31 ENCOUNTER — Encounter (HOSPITAL_COMMUNITY): Payer: Self-pay

## 2017-03-31 DIAGNOSIS — I4819 Other persistent atrial fibrillation: Secondary | ICD-10-CM

## 2017-03-31 DIAGNOSIS — R918 Other nonspecific abnormal finding of lung field: Secondary | ICD-10-CM | POA: Insufficient documentation

## 2017-03-31 DIAGNOSIS — I481 Persistent atrial fibrillation: Secondary | ICD-10-CM | POA: Insufficient documentation

## 2017-03-31 DIAGNOSIS — I4891 Unspecified atrial fibrillation: Secondary | ICD-10-CM | POA: Diagnosis not present

## 2017-03-31 MED ORDER — METOPROLOL TARTRATE 5 MG/5ML IV SOLN: 5.0000 mg | Freq: Once | INTRAVENOUS | Status: DC

## 2017-03-31 MED ORDER — IOPAMIDOL (ISOVUE-370) INJECTION 76%
80.0000 mL | Freq: Once | INTRAVENOUS | Status: AC | PRN
  Administered 2017-03-31: 80 mL via INTRAVENOUS

## 2017-03-31 MED ORDER — METOPROLOL TARTRATE 5 MG/5ML IV SOLN
INTRAVENOUS | Status: AC
  Filled 2017-03-31: qty 5

## 2017-03-31 MED ORDER — IOPAMIDOL (ISOVUE-370) INJECTION 76%
INTRAVENOUS | Status: DC
Start: 2017-03-31 — End: 2017-04-01
  Filled 2017-03-31: qty 100

## 2017-04-03 ENCOUNTER — Encounter (HOSPITAL_COMMUNITY)
Admission: RE | Disposition: A | Discharge status: Home / Self Care | Payer: Self-pay | Source: Ambulatory Visit | Attending: Cardiology

## 2017-04-03 ENCOUNTER — Ambulatory Visit (HOSPITAL_COMMUNITY)
Admission: RE | Admit: 2017-04-03 | Discharge: 2017-04-04 | Disposition: A | Discharge status: Home / Self Care | Payer: BLUE CROSS/BLUE SHIELD | Source: Ambulatory Visit | Attending: Cardiology | Admitting: Cardiology

## 2017-04-03 ENCOUNTER — Ambulatory Visit (HOSPITAL_COMMUNITY): Payer: BLUE CROSS/BLUE SHIELD | Admitting: Certified Registered Nurse Anesthetist

## 2017-04-03 ENCOUNTER — Encounter (HOSPITAL_COMMUNITY): Payer: Self-pay | Admitting: Certified Registered Nurse Anesthetist

## 2017-04-03 DIAGNOSIS — M1990 Unspecified osteoarthritis, unspecified site: Secondary | ICD-10-CM | POA: Insufficient documentation

## 2017-04-03 DIAGNOSIS — I481 Persistent atrial fibrillation: Secondary | ICD-10-CM | POA: Diagnosis not present

## 2017-04-03 DIAGNOSIS — Z87891 Personal history of nicotine dependence: Secondary | ICD-10-CM | POA: Insufficient documentation

## 2017-04-03 DIAGNOSIS — G4730 Sleep apnea, unspecified: Secondary | ICD-10-CM | POA: Diagnosis not present

## 2017-04-03 DIAGNOSIS — I509 Heart failure, unspecified: Secondary | ICD-10-CM | POA: Diagnosis not present

## 2017-04-03 DIAGNOSIS — I4891 Unspecified atrial fibrillation: Secondary | ICD-10-CM | POA: Insufficient documentation

## 2017-04-03 DIAGNOSIS — Z9889 Other specified postprocedural states: Secondary | ICD-10-CM

## 2017-04-03 DIAGNOSIS — Z6838 Body mass index (BMI) 38.0-38.9, adult: Secondary | ICD-10-CM | POA: Diagnosis not present

## 2017-04-03 DIAGNOSIS — Z950 Presence of cardiac pacemaker: Secondary | ICD-10-CM | POA: Insufficient documentation

## 2017-04-03 HISTORY — PX: ATRIAL FIBRILLATION ABLATION: EP1191

## 2017-04-03 LAB — POCT ACTIVATED CLOTTING TIME
ACTIVATED CLOTTING TIME: 307 s
ACTIVATED CLOTTING TIME: 318 s
ACTIVATED CLOTTING TIME: 208 s
ACTIVATED CLOTTING TIME: 191 s
Activated Clotting Time: 208 seconds
Activated Clotting Time: 252 seconds
Activated Clotting Time: 329 seconds
Activated Clotting Time: 235 seconds

## 2017-04-03 LAB — GLUCOSE, CAPILLARY
Glucose-Capillary: 89 mg/dL (ref 65–99)
Glucose-Capillary: 150 mg/dL — ABNORMAL HIGH (ref 65–99)

## 2017-04-03 SURGERY — ATRIAL FIBRILLATION ABLATION
Anesthesia: General

## 2017-04-03 MED ORDER — MIDAZOLAM HCL 5 MG/5ML IJ SOLN
INTRAMUSCULAR | Status: DC | PRN
  Administered 2017-04-03 (×2): 1 mg via INTRAVENOUS

## 2017-04-03 MED ORDER — HEPARIN SODIUM (PORCINE) 1000 UNIT/ML IJ SOLN
INTRAMUSCULAR | Status: AC
  Filled 2017-04-03: qty 2

## 2017-04-03 MED ORDER — PROTAMINE SULFATE 10 MG/ML IV SOLN
INTRAVENOUS | Status: DC | PRN
  Administered 2017-04-03: 40 mg via INTRAVENOUS

## 2017-04-03 MED ORDER — ROCURONIUM BROMIDE 100 MG/10ML IV SOLN
INTRAVENOUS | Status: DC | PRN
  Administered 2017-04-03: 50 mg via INTRAVENOUS
  Administered 2017-04-03: 30 mg via INTRAVENOUS
  Administered 2017-04-03: 10 mg via INTRAVENOUS

## 2017-04-03 MED ORDER — PHENYLEPHRINE HCL 10 MG/ML IJ SOLN
INTRAVENOUS | Status: DC | PRN
  Administered 2017-04-03: 15 ug/min via INTRAVENOUS

## 2017-04-03 MED ORDER — ALPRAZOLAM 0.25 MG PO TABS
0.2500 mg | ORAL_TABLET | Freq: Two times a day (BID) | ORAL | Status: DC | PRN
  Administered 2017-04-03: 0.25 mg via ORAL
  Filled 2017-04-03: qty 1

## 2017-04-03 MED ORDER — DOXAZOSIN MESYLATE 4 MG PO TABS
4.0000 mg | ORAL_TABLET | Freq: Every day | ORAL | Status: DC
  Administered 2017-04-03: 4 mg via ORAL
  Filled 2017-04-03: qty 1

## 2017-04-03 MED ORDER — FUROSEMIDE 40 MG PO TABS
40.0000 mg | ORAL_TABLET | Freq: Every day | ORAL | Status: DC
  Administered 2017-04-03 – 2017-04-04 (×2): 40 mg via ORAL
  Filled 2017-04-03 (×2): qty 1

## 2017-04-03 MED ORDER — LIDOCAINE HCL (CARDIAC) 20 MG/ML IV SOLN
INTRAVENOUS | Status: DC | PRN
  Administered 2017-04-03: 40 mg via INTRAVENOUS

## 2017-04-03 MED ORDER — DIGOXIN 125 MCG PO TABS
0.1250 mg | ORAL_TABLET | Freq: Every day | ORAL | Status: DC
  Administered 2017-04-03 – 2017-04-04 (×2): 0.125 mg via ORAL
  Filled 2017-04-03 (×2): qty 1

## 2017-04-03 MED ORDER — DILTIAZEM HCL ER COATED BEADS 240 MG PO CP24
240.0000 mg | ORAL_CAPSULE | Freq: Every day | ORAL | Status: DC
  Administered 2017-04-03 – 2017-04-04 (×2): 240 mg via ORAL
  Filled 2017-04-03 (×2): qty 1

## 2017-04-03 MED ORDER — LIDOCAINE HCL (PF) 1 % IJ SOLN
INTRAMUSCULAR | Status: AC
  Filled 2017-04-03: qty 30

## 2017-04-03 MED ORDER — PROPOFOL 10 MG/ML IV BOLUS
INTRAVENOUS | Status: DC | PRN
  Administered 2017-04-03: 200 mg via INTRAVENOUS

## 2017-04-03 MED ORDER — PHENYLEPHRINE HCL 10 MG/ML IJ SOLN
INTRAMUSCULAR | Status: DC | PRN
  Administered 2017-04-03 (×2): 60 ug via INTRAVENOUS
  Administered 2017-04-03: 80 ug via INTRAVENOUS

## 2017-04-03 MED ORDER — APIXABAN 5 MG PO TABS
5.0000 mg | ORAL_TABLET | Freq: Two times a day (BID) | ORAL | Status: DC
  Administered 2017-04-03 – 2017-04-04 (×2): 5 mg via ORAL
  Filled 2017-04-03 (×2): qty 1

## 2017-04-03 MED ORDER — VITAMIN C 500 MG PO TABS
1000.0000 mg | ORAL_TABLET | Freq: Every day | ORAL | Status: DC
  Administered 2017-04-03 – 2017-04-04 (×2): 1000 mg via ORAL
  Filled 2017-04-03 (×2): qty 2

## 2017-04-03 MED ORDER — HEPARIN SODIUM (PORCINE) 1000 UNIT/ML IJ SOLN
INTRAMUSCULAR | Status: DC | PRN
  Administered 2017-04-03: 1000 [IU] via INTRAVENOUS

## 2017-04-03 MED ORDER — STRESS FORMULA PO TABS: ORAL_TABLET | Freq: Every day | ORAL | Status: DC

## 2017-04-03 MED ORDER — SUGAMMADEX SODIUM 200 MG/2ML IV SOLN
INTRAVENOUS | Status: DC | PRN
  Administered 2017-04-03: 250 mg via INTRAVENOUS

## 2017-04-03 MED ORDER — CITALOPRAM HYDROBROMIDE 20 MG PO TABS
40.0000 mg | ORAL_TABLET | Freq: Every day | ORAL | Status: DC
  Administered 2017-04-03 – 2017-04-04 (×2): 40 mg via ORAL
  Filled 2017-04-03 (×2): qty 2

## 2017-04-03 MED ORDER — SUCCINYLCHOLINE CHLORIDE 20 MG/ML IJ SOLN
INTRAMUSCULAR | Status: DC | PRN
Start: 2017-04-03 — End: 2017-04-03
  Administered 2017-04-03: 140 mg via INTRAVENOUS

## 2017-04-03 MED ORDER — DOBUTAMINE IN D5W 4-5 MG/ML-% IV SOLN
INTRAVENOUS | Status: AC
  Filled 2017-04-03: qty 250

## 2017-04-03 MED ORDER — INDOMETHACIN 25 MG PO CAPS: 50.0000 mg | ORAL_CAPSULE | ORAL | Status: DC | PRN

## 2017-04-03 MED ORDER — DOBUTAMINE IN D5W 4-5 MG/ML-% IV SOLN
INTRAVENOUS | Status: DC | PRN
  Administered 2017-04-03: 20 ug/kg/min via INTRAVENOUS

## 2017-04-03 MED ORDER — TESTOSTERONE 50 MG/5GM (1%) TD GEL
5.0000 g | Freq: Every day | TRANSDERMAL | Status: DC
  Filled 2017-04-03: qty 5

## 2017-04-03 MED ORDER — CEFAZOLIN SODIUM-DEXTROSE 2-3 GM-% IV SOLR
INTRAVENOUS | Status: DC | PRN
  Administered 2017-04-03: 2 g via INTRAVENOUS

## 2017-04-03 MED ORDER — HEPARIN (PORCINE) IN NACL 2-0.9 UNIT/ML-% IJ SOLN
INTRAMUSCULAR | Status: AC | PRN
  Administered 2017-04-03: 2000 mL

## 2017-04-03 MED ORDER — SODIUM CHLORIDE 0.9 % IV SOLN
INTRAVENOUS | Status: DC | PRN
  Administered 2017-04-03 (×2): via INTRAVENOUS

## 2017-04-03 MED ORDER — FENTANYL CITRATE (PF) 100 MCG/2ML IJ SOLN
INTRAMUSCULAR | Status: DC | PRN
Start: 2017-04-03 — End: 2017-04-03
  Administered 2017-04-03 (×2): 25 ug via INTRAVENOUS
  Administered 2017-04-03: 50 ug via INTRAVENOUS

## 2017-04-03 MED ORDER — HEPARIN SODIUM (PORCINE) 1000 UNIT/ML IJ SOLN
INTRAMUSCULAR | Status: DC | PRN
  Administered 2017-04-03: 15000 [IU] via INTRAVENOUS
  Administered 2017-04-03 (×3): 3000 [IU] via INTRAVENOUS
  Administered 2017-04-03: 12000 [IU] via INTRAVENOUS
  Administered 2017-04-03: 8000 [IU] via INTRAVENOUS

## 2017-04-03 MED ORDER — LIDOCAINE HCL (PF) 1 % IJ SOLN
INTRAMUSCULAR | Status: AC
  Filled 2017-04-03: qty 60

## 2017-04-03 MED ORDER — SODIUM CHLORIDE 0.9% FLUSH
3.0000 mL | Freq: Two times a day (BID) | INTRAVENOUS | Status: DC
  Administered 2017-04-03: 3 mL via INTRAVENOUS

## 2017-04-03 MED ORDER — ONDANSETRON HCL 4 MG/2ML IJ SOLN
4.0000 mg | Freq: Four times a day (QID) | INTRAMUSCULAR | Status: DC | PRN
Start: 2017-04-03 — End: 2017-04-04

## 2017-04-03 MED ORDER — LIDOCAINE HCL (PF) 1 % IJ SOLN
INTRAMUSCULAR | Status: DC | PRN
  Administered 2017-04-03: 45 mL

## 2017-04-03 MED ORDER — ONDANSETRON HCL 4 MG/2ML IJ SOLN
INTRAMUSCULAR | Status: DC | PRN
  Administered 2017-04-03: 4 mg via INTRAVENOUS

## 2017-04-03 MED ORDER — VITAMIN D 1000 UNITS PO TABS
1000.0000 [IU] | ORAL_TABLET | Freq: Every day | ORAL | Status: DC
  Administered 2017-04-03 – 2017-04-04 (×2): 1000 [IU] via ORAL
  Filled 2017-04-03 (×2): qty 1

## 2017-04-03 MED ORDER — ACETAMINOPHEN 325 MG PO TABS: 650.0000 mg | ORAL_TABLET | ORAL | Status: DC | PRN

## 2017-04-03 MED ORDER — MONTELUKAST SODIUM 10 MG PO TABS
10.0000 mg | ORAL_TABLET | Freq: Every day | ORAL | Status: DC
  Administered 2017-04-03: 10 mg via ORAL
  Filled 2017-04-03: qty 1

## 2017-04-03 MED ORDER — CEFAZOLIN SODIUM-DEXTROSE 2-4 GM/100ML-% IV SOLN
INTRAVENOUS | Status: AC
  Filled 2017-04-03: qty 100

## 2017-04-03 MED ORDER — VITAMIN B-12 1000 MCG PO TABS
1000.0000 ug | ORAL_TABLET | Freq: Every day | ORAL | Status: DC
  Administered 2017-04-03 – 2017-04-04 (×2): 1000 ug via ORAL
  Filled 2017-04-03 (×2): qty 1

## 2017-04-03 MED ORDER — SODIUM CHLORIDE 0.9 % IV SOLN: 250.0000 mL | INTRAVENOUS | Status: DC | PRN

## 2017-04-03 MED ORDER — SODIUM CHLORIDE 0.9% FLUSH: 3.0000 mL | INTRAVENOUS | Status: DC | PRN

## 2017-04-03 MED ORDER — DEXAMETHASONE SODIUM PHOSPHATE 10 MG/ML IJ SOLN
INTRAMUSCULAR | Status: DC | PRN
  Administered 2017-04-03: 5 mg via INTRAVENOUS

## 2017-04-03 MED ORDER — METOPROLOL SUCCINATE ER 50 MG PO TB24
50.0000 mg | ORAL_TABLET | Freq: Every day | ORAL | Status: DC
  Administered 2017-04-03 – 2017-04-04 (×2): 50 mg via ORAL
  Filled 2017-04-03 (×2): qty 1

## 2017-04-03 SURGICAL SUPPLY — 18 items
BAG SNAP BAND KOVER 36X36 (MISCELLANEOUS) ×3 IMPLANT
BLANKET WARM UNDERBOD FULL ACC (MISCELLANEOUS) ×3 IMPLANT
CATH SMTCH THERMOCOOL SF DF (CATHETERS) ×6 IMPLANT
CATH SOUNDSTAR 3D IMAGING (CATHETERS) ×3 IMPLANT
CATH VARIABLE LASSO NAV 2515 (CATHETERS) ×3 IMPLANT
CATH WEBSTER BI DIR CS D-F CRV (CATHETERS) ×3 IMPLANT
COVER SWIFTLINK CONNECTOR (BAG) ×3 IMPLANT
NEEDLE TRANSSEPTAL BRK 98CM (NEEDLE) ×3 IMPLANT
PACK EP LATEX FREE (CUSTOM PROCEDURE TRAY) ×2
PACK EP LF (CUSTOM PROCEDURE TRAY) ×1 IMPLANT
PAD DEFIB LIFELINK (PAD) ×3 IMPLANT
PATCH CARTO3 (PAD) ×3 IMPLANT
SHEATH AGILIS NXT 8.5F 71CM (SHEATH) ×6 IMPLANT
SHEATH AVANTI 11F 11CM (SHEATH) ×3 IMPLANT
SHEATH PINNACLE 7F 10CM (SHEATH) ×3 IMPLANT
SHEATH PINNACLE 8F 10CM (SHEATH) ×6 IMPLANT
SHEATH PINNACLE 9F 10CM (SHEATH) ×6 IMPLANT
TUBING SMART ABLATE COOLFLOW (TUBING) ×3 IMPLANT

## 2017-04-03 NOTE — Anesthesia Procedure Notes (Signed)
Procedure Name: Intubation Date/Time: 04/03/2017 7:46 AM Performed by: Rejeana Brock L Pre-anesthesia Checklist: Patient identified, Emergency Drugs available, Suction available and Patient being monitored Patient Re-evaluated:Patient Re-evaluated prior to induction Oxygen Delivery Method: Circle System Utilized Preoxygenation: Pre-oxygenation with 100% oxygen Induction Type: IV induction and Cricoid Pressure applied Ventilation: Mask ventilation without difficulty and Oral airway inserted - appropriate to patient size Laryngoscope Size: Mac and 4 Grade View: Grade IV Tube type: Oral Tube size: 7.5 mm Number of attempts: 1 Airway Equipment and Method: Stylet and Oral airway Placement Confirmation: ETT inserted through vocal cords under direct vision,  positive ETCO2 and breath sounds checked- equal and bilateral Secured at: 23 cm Tube secured with: Tape Dental Injury: Teeth and Oropharynx as per pre-operative assessment  Difficulty Due To: Difficulty was anticipated, Difficult Airway- due to large tongue and Difficult Airway- due to anterior larynx Future Recommendations: Recommend- induction with short-acting agent, and alternative techniques readily available

## 2017-04-03 NOTE — Anesthesia Preprocedure Evaluation (Addendum)
Anesthesia Evaluation  Patient identified by MRN, date of birth, ID band Patient awake    Reviewed: Allergy & Precautions, NPO status , Patient's Chart, lab work & pertinent test results  History of Anesthesia Complications Negative for: history of anesthetic complications  Airway Mallampati: II  TM Distance: >3 FB Neck ROM: Full    Dental  (+) Dental Advisory Given   Pulmonary sleep apnea , former smoker,    breath sounds clear to auscultation       Cardiovascular +CHF  + dysrhythmias Atrial Fibrillation + pacemaker  Rhythm:Irregular     Neuro/Psych    GI/Hepatic   Endo/Other  Morbid obesity  Renal/GU      Musculoskeletal  (+) Arthritis ,   Abdominal   Peds  Hematology   Anesthesia Other Findings   Reproductive/Obstetrics                            Anesthesia Physical Anesthesia Plan  ASA: III  Anesthesia Plan: General   Post-op Pain Management:    Induction: Intravenous  PONV Risk Score and Plan: 2 and Ondansetron and Dexamethasone  Airway Management Planned: LMA  Additional Equipment:   Intra-op Plan:   Post-operative Plan: Extubation in OR  Informed Consent: I have reviewed the patients History and Physical, chart, labs and discussed the procedure including the risks, benefits and alternatives for the proposed anesthesia with the patient or authorized representative who has indicated his/her understanding and acceptance.   Dental advisory given  Plan Discussed with: CRNA and Anesthesiologist  Anesthesia Plan Comments:        Anesthesia Quick Evaluation

## 2017-04-03 NOTE — H&P (Signed)
Shane [name] is a 71 y.o. male wit a history of atrial fibrillation. He presents today for ablation. On exam, iRRR, no murmurs, lungs clear. Risks and benefits discussed. Risks include but not limited to bleeding, tamponade, heart block, stroke, and damage to surrounding organs. He understands the risks and has agreed to the procedure.   Nyeemah Jennette Curt Bears, MD 04/03/2017 7:10 AM

## 2017-04-03 NOTE — Transfer of Care (Signed)
Immediate Anesthesia Transfer of Care Note  Patient: Shane [name]  Procedure(s) Performed: Procedure(s): Atrial Fibrillation Ablation (N/A)  Patient Location: PACU  Anesthesia Type:General  Level of Consciousness: awake, alert  and oriented  Airway & Oxygen Therapy: Patient Spontanous Breathing and Patient connected to nasal cannula oxygen  Post-op Assessment: Report given to RN, Post -op Vital signs reviewed and stable and Patient moving all extremities X 4  Post vital signs: Reviewed and stable  Last Vitals:  Vitals:   04/03/17 0547 04/03/17 1213  BP: (!) 150/91   Pulse: 84   Temp: 36.7 C 36.6 C    Last Pain:  Vitals:   04/03/17 0547  TempSrc: Oral         Complications: No apparent anesthesia complications

## 2017-04-03 NOTE — Progress Notes (Signed)
Site area: Left groin a 11 and 7 french venous sheath was removed by Soyla Piya Mesch RN  Site Prior to Removal:  Level 0  Pressure Applied For 20 MINUTES    Bedrest Beginning at 1720p  Manual:   Yes.    Patient Status During Pull:  stable  Post Pull Groin Site:  Level 0  Post Pull Instructions Given:  Yes.    Post Pull Pulses Present:  Yes.    Dressing Applied:  Yes.    Comments:  VS remain stable during sheath pull

## 2017-04-03 NOTE — Discharge Instructions (Signed)
No driving for 4 days. No lifting over 5 lbs for 1 week. No vigorous or sexual activity for 1 week. You may return to work on 04/10/17. Keep procedure site clean & dry. If you notice increased pain, swelling, bleeding or pus, call/return!  You may shower, but no soaking baths/hot tubs/pools for 1 week.     You have an appointment set up with the [facility].  Multiple studies have shown that being followed by a dedicated atrial fibrillation clinic in addition to the standard care you receive from your other physicians improves health. We believe that enrollment in the atrial fibrillation clinic will allow [place] to better care for you.   The phone number to the [facility] is [phone]. The clinic is staffed Monday through Friday from 8:30am to 5pm.  Parking Directions: The clinic is located in the Heart and Vascular Building connected to [facility]. 1)From [address] turn on to [address] and go to the 3rd entrance  (Heart and Vascular entrance) on the right. 2)Look to the right for Heart &Vascular Parking Garage. 3)A code for the entrance is required please call the clinic to receive this.   4)Take the elevators to the 1st floor. Registration is in the room with the glass walls at the end of the hallway.  If you have any trouble parking or locating the clinic, please dont hesitate to call [phone].

## 2017-04-03 NOTE — Progress Notes (Addendum)
Site area: Right groin a 9 french arterial sheath X2 was removed  Site Prior to Removal:  Level 0  Pressure Applied For 40 MINUTES    Bedrest Beginning at 1720p  Manual:   Yes.    Patient Status During Pull:  stable  Post Pull Groin Site:  Level 0  Post Pull Instructions Given:  Yes.    Post Pull Pulses Present:  Yes.    Dressing Applied:  Yes.    Comments:  VS remain stable during sheath pull.  Pt had a rebleed and pressure held by Robynn Pane RN for 15 min

## 2017-04-04 DIAGNOSIS — G4730 Sleep apnea, unspecified: Secondary | ICD-10-CM | POA: Diagnosis not present

## 2017-04-04 DIAGNOSIS — Z9889 Other specified postprocedural states: Secondary | ICD-10-CM

## 2017-04-04 DIAGNOSIS — M1990 Unspecified osteoarthritis, unspecified site: Secondary | ICD-10-CM | POA: Diagnosis not present

## 2017-04-04 DIAGNOSIS — I481 Persistent atrial fibrillation: Secondary | ICD-10-CM | POA: Diagnosis not present

## 2017-04-04 DIAGNOSIS — I509 Heart failure, unspecified: Secondary | ICD-10-CM | POA: Diagnosis not present

## 2017-04-04 NOTE — Progress Notes (Signed)
Pt discharged to home with wife.  Pt verbalized understanding of discharge instructions and follow up care.  All belongings sent home with family.  Education provided re: heart failure, daily weights, medications, when to call MD and follow up care.  Danton Clap, RN

## 2017-04-04 NOTE — Discharge Summary (Signed)
Discharge Summary    Patient ID: Shane [name],  MRN: [id number], DOB/AGE: Nov 06, 1945 71 y.o.  Admit date: 04/03/2017 Discharge date: 04/04/2017  Primary Care Provider: Albina Billet Primary Cardiologist: Dr. Sallyanne Kuster  EP: Dr. Curt Bears  Discharge Diagnoses    Principal Problem:   Status post catheter ablation of atrial fibrillation   Allergies No Known Allergies  Diagnostic Studies/Procedures    EP Study with Afib Ablation 04/03/17 CONCLUSIONS: 1. Atrial fibrillation upon presentation.   2. Successful electrical isolation and anatomical encircling of all four pulmonary veins with radiofrequency current.  A WACA approach was used 3. Additional left atrial ablation was performed with a LA roof line created along the posterior wall of the left atrium 4. Atrial fibrillation successfully cardioverted to sinus rhythm. 5. No early apparent complications.    History of Present Illness     71 y.o. male admitted by Dr. Curt Bears for afib ablation for persistent atrial fibrillation. He has a pacemaker in place that shows 17% ventricular pacing. He has been in atrial fibrillation since January 24. Attempted cardioversion on February 20 and failed after 2 shocks. Left atrium was severely dilated. 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  Hospital Course     Pt presented to [facility] on 04/03/17 for the planned procedure. He underwent successful afib ablation. He was monitored overnight and had no post procedural complications. No arrhthymias, CP or dyspnea. No complications with catheter access site. He was last seen and examined by Dr. Lovena Le, who determined he was stable for discharge home. Eliquis was continued. 1 month f/u has been arranged in the Afib clinic and 2 month f/u with Dr. Curt Bears.    Consultants: none    Discharge Vitals Blood pressure 119/78, pulse 60, temperature 98.3 F (36.8 C), temperature source Oral, resp. rate 19, height 5\' 11"  (1.803 m),  weight 279 lb 3.2 oz (126.6 kg), SpO2 97 %.  Filed Weights   04/03/17 0547 04/04/17 0500  Weight: 279 lb (126.6 kg) 279 lb 3.2 oz (126.6 kg)    Labs & Radiologic Studies    CBC No results for input(s): WBC, NEUTROABS, HGB, HCT, MCV, PLT in the last 72 hours. Basic Metabolic Panel No results for input(s): NA, K, CL, CO2, GLUCOSE, BUN, CREATININE, CALCIUM, MG, PHOS in the last 72 hours. Liver Function Tests No results for input(s): AST, ALT, ALKPHOS, BILITOT, PROT, ALBUMIN in the last 72 hours. No results for input(s): LIPASE, AMYLASE in the last 72 hours. Cardiac Enzymes No results for input(s): CKTOTAL, CKMB, CKMBINDEX, TROPONINI in the last 72 hours. BNP Invalid input(s): POCBNP D-Dimer No results for input(s): DDIMER in the last 72 hours. Hemoglobin A1C No results for input(s): HGBA1C in the last 72 hours. Fasting Lipid Panel No results for input(s): CHOL, HDL, LDLCALC, TRIG, CHOLHDL, LDLDIRECT in the last 72 hours. Thyroid Function Tests No results for input(s): TSH, T4TOTAL, T3FREE, THYROIDAB in the last 72 hours.  Invalid input(s): FREET3 _____________  Ct Cardiac Morph/pulm Vein W/cm&w/o Ca Score  Addendum Date: 04/01/2017   ADDENDUM REPORT: 04/01/2017 15:47 CLINICAL DATA:  71 year old with atrial fibrillation scheduled for an ablation. EXAM: Cardiac CT/CTA TECHNIQUE: The patient was scanned on a Siemens Somatom scanner. FINDINGS: A 120 kV prospective scan was triggered in the descending thoracic aorta at 111 HU's. Gantry rotation speed was 280 msecs and collimation was .9 mm. No beta blockade and no NTG was given. The 3D data set was reconstructed in 5% intervals of the 60-80 % of  the R-R cycle. Diastolic phases were analyzed on a dedicated work station using MPR, MIP and VRT modes. The patient received 80 cc of contrast. There is normal pulmonary vein drainage into the left atrium (2 on the right and 2 on the left) with ostial measurements as follows: RUPV:  27 x 24 mm RLPV:   21 x 15 mm LUPV:  28 x 24 mm LLPV:  24 x 17 mm The left atrial appendage is large - mixed chicken wing / broccoli type with two lobes and ostial size 34 x 23 mm and length 54 mm. There is no thrombus in the left atrial appendage. The esophagus runs in the left atrial midline and is in the proximity to the LLPV. Aorta:  Normal caliber.  No dissection or calcifications. Aortic Valve:  Trileaflet.  No calcifications. IMPRESSION: 1. There is normal pulmonary vein drainage into the left atrium. No stenosis. 2. The left atrial appendage is large - mixed chicken wing / broccoli type with two lobes and ostial size 34 x 23 mm and length 54 mm. There is no thrombus in the left atrial appendage. 3. The esophagus runs in the left atrial midline and is in the proximity to the LLPV. Shane [name] Electronically Signed   By: Shane [name]   On: 04/01/2017 15:47   Result Date: 04/01/2017 EXAM: OVER-READ INTERPRETATION  CT CHEST The following report is an over-read performed by radiologist Dr. Rebekah [facility], [place] on 03/31/2017. This over-read does not include interpretation of cardiac or coronary anatomy or pathology. The coronary calcium score/coronary CTA interpretation by the cardiologist is attached. COMPARISON:  None. FINDINGS: Multiple tiny 2-3 mm calcified noncalcified pulmonary nodules noted in the lungs bilaterally, nonspecific, but statistically likely benign. No larger more suspicious appearing pulmonary nodules or masses are noted elsewhere in the visualized portions of the thorax. Linear scarring in the lower lobes of the lungs bilaterally. Within the visualized thorax there is no acute consolidative airspace disease, no pleural effusions, no pneumothorax and no lymphadenopathy. Visualized portions of the upper abdomen are unremarkable. There are no aggressive appearing lytic or blastic lesions noted in the visualized portions of the skeleton. IMPRESSION: 1. Multiple tiny 2-3 mm pulmonary  nodules are noted in lungs bilaterally, nonspecific but statistically likely benign. No follow-up needed if patient is low-risk (and has no known or suspected primary neoplasm). Non-contrast chest CT can be considered in 12 months if patient is high-risk. This recommendation follows the consensus statement: Guidelines for Management of Incidental Pulmonary Nodules Detected on CT Images: From the Fleischner Society 2017; Radiology 2017; 284:228-243. Electronically Signed: By: Vinnie Langton M.D. On: 03/31/2017 15:43   Disposition   Pt is being discharged home today in good condition.  Follow-up Plans & Appointments    Follow-up Information    [facility] Follow up on 05/05/2017.   Specialty:  Cardiology Why:  9:30AM Contact information: [address] [id number] mc [address] [place] [address] [phone]       Constance Haw, MD Follow up on 07/07/2017.   Specialty:  Cardiology Why:  11:00AM Contact information: [address] [place] [address] [phone]        Sanda Klein, MD Follow up on 04/22/2017.   Specialty:  Cardiology Why:  9:20AM Contact information: [address] [place] [address] [phone]          Discharge Instructions    Diet - low sodium heart healthy  Complete by:  As directed    Increase activity slowly    Complete by:  As directed       Discharge Medications   Current Discharge Medication List    CONTINUE these medications which have NOT CHANGED   Details  Ascorbic Acid (VITAMIN C) 1000 MG tablet Take 1,000 mg by mouth daily.    B Complex-C-Folic Acid (STRESS FORMULA) TABS Take by mouth daily.    Calcium Citrate (CITRACAL PO) Take by mouth daily.    celecoxib (CELEBREX) 200 MG capsule Take 1 capsule (200 mg total) by mouth daily. Qty: 30 capsule, Refills: 6    Cholecalciferol (VITAMIN D-3) 1000 UNITS CAPS Take 1 capsule by mouth daily.      citalopram (CELEXA) 40 MG tablet Take 1 tablet (40 mg total) by mouth daily. MUST RECEIVE FUTURE REFILLS FROM PCP. Qty: 30 tablet, Refills: 0    digoxin (LANOXIN) 0.125 MG tablet Take 1 tablet (0.125 mg total) by mouth daily. Qty: 90 tablet, Refills: 3    diltiazem (CARDIZEM CD) 240 MG 24 hr capsule Take 1 capsule (240 mg total) by mouth daily. Qty: 30 capsule, Refills: 10    doxazosin (CARDURA) 4 MG tablet Take 4 mg by mouth at bedtime.    ELIQUIS 5 MG TABS tablet Take 1 tablet (5 mg total) by mouth 2 (two) times daily. Qty: 60 tablet, Refills: 9    furosemide (LASIX) 40 MG tablet Take 1 tablet (40 mg total) by mouth daily. Qty: 30 tablet, Refills: 9    indomethacin (INDOCIN) 50 MG capsule Take 50 mg by mouth as needed (Gout).    metoprolol succinate (TOPROL-XL) 50 MG 24 hr tablet Take 1 tablet (50 mg total) by mouth daily. Qty: 30 tablet, Refills: 11    montelukast (SINGULAIR) 10 MG tablet Take 10 mg by mouth at bedtime.    Omega-3 Fatty Acids (FISH OIL) 1200 MG CAPS Take 1,200 mg by mouth 2 (two) times daily.     testosterone (ANDROGEL) 50 MG/5GM GEL Place 5 g onto the skin daily.    vitamin B-12 (CYANOCOBALAMIN) 1000 MCG tablet Take 1,000 mcg by mouth daily.           Outstanding Labs/Studies     Duration of Discharge Encounter   Greater than 30 minutes including physician time.  Signed, Lyda Jester PA-C 04/04/2017, 9:35 AM  EP Attending  Patient seen and examined. Agree with above. He is stable for DC home. Usual followup.   Mikle Bosworth.D.

## 2017-04-06 ENCOUNTER — Encounter (HOSPITAL_COMMUNITY): Payer: Self-pay | Admitting: Cardiology

## 2017-04-07 ENCOUNTER — Telehealth: Payer: Self-pay | Admitting: Cardiology

## 2017-04-07 ENCOUNTER — Ambulatory Visit (HOSPITAL_COMMUNITY)
Admission: RE | Admit: 2017-04-07 | Discharge: 2017-04-07 | Disposition: A | Discharge status: Home / Self Care | Payer: BLUE CROSS/BLUE SHIELD | Source: Ambulatory Visit | Attending: Nurse Practitioner | Admitting: Nurse Practitioner

## 2017-04-07 ENCOUNTER — Encounter (HOSPITAL_COMMUNITY): Payer: Self-pay | Admitting: Nurse Practitioner

## 2017-04-07 VITALS — BP 104/56 | HR 60 | Temp 98.7°F | Ht 71.0 in | Wt 285.4 lb

## 2017-04-07 DIAGNOSIS — R0600 Dyspnea, unspecified: Secondary | ICD-10-CM | POA: Diagnosis not present

## 2017-04-07 DIAGNOSIS — R509 Fever, unspecified: Secondary | ICD-10-CM

## 2017-04-07 DIAGNOSIS — I4891 Unspecified atrial fibrillation: Secondary | ICD-10-CM | POA: Insufficient documentation

## 2017-04-07 DIAGNOSIS — R918 Other nonspecific abnormal finding of lung field: Secondary | ICD-10-CM | POA: Diagnosis not present

## 2017-04-07 DIAGNOSIS — R9431 Abnormal electrocardiogram [ECG] [EKG]: Secondary | ICD-10-CM | POA: Diagnosis not present

## 2017-04-07 DIAGNOSIS — R609 Edema, unspecified: Secondary | ICD-10-CM | POA: Diagnosis not present

## 2017-04-07 LAB — CBC
HCT: 37.7 % — ABNORMAL LOW (ref 39.0–52.0)
HEMOGLOBIN: 12.8 g/dL — AB (ref 13.0–17.0)
MCH: 32.5 pg (ref 26.0–34.0)
MCHC: 34 g/dL (ref 30.0–36.0)
MCV: 95.7 fL (ref 78.0–100.0)
Platelets: 207 10*3/uL (ref 150–400)
RBC: 3.94 MIL/uL — AB (ref 4.22–5.81)
RDW: 13.2 % (ref 11.5–15.5)
WBC: 7.2 10*3/uL (ref 4.0–10.5)

## 2017-04-07 LAB — COMPREHENSIVE METABOLIC PANEL
ALBUMIN: 3.2 g/dL — AB (ref 3.5–5.0)
ALK PHOS: 36 U/L — AB (ref 38–126)
ALT: 70 U/L — AB (ref 17–63)
ANION GAP: 10 (ref 5–15)
AST: 50 U/L — ABNORMAL HIGH (ref 15–41)
BUN: 18 mg/dL (ref 6–20)
CALCIUM: 8.9 mg/dL (ref 8.9–10.3)
CO2: 25 mmol/L (ref 22–32)
CREATININE: 1.05 mg/dL (ref 0.61–1.24)
Chloride: 102 mmol/L (ref 101–111)
GFR calc Af Amer: >60 mL/min (ref >60)
GFR calc non Af Amer: >60 mL/min (ref >60)
GLUCOSE: 100 mg/dL — AB (ref 65–99)
Potassium: 3.4 mmol/L — ABNORMAL LOW (ref 3.5–5.1)
SODIUM: 137 mmol/L (ref 135–145)
Total Bilirubin: 2.2 mg/dL — ABNORMAL HIGH (ref 0.3–1.2)
Total Protein: 6.4 g/dL — ABNORMAL LOW (ref 6.5–8.1)

## 2017-04-07 LAB — URINALYSIS, ROUTINE W REFLEX MICROSCOPIC
Bilirubin Urine: NEGATIVE
GLUCOSE, UA: NEGATIVE mg/dL
Hgb urine dipstick: NEGATIVE
Ketones, ur: NEGATIVE mg/dL
LEUKOCYTES UA: NEGATIVE
Nitrite: NEGATIVE
PH: 7 (ref 5.0–8.0)
PROTEIN: NEGATIVE mg/dL
SPECIFIC GRAVITY, URINE: 1.01 (ref 1.005–1.030)

## 2017-04-07 MED ORDER — PANTOPRAZOLE SODIUM 40 MG PO TBEC: 40.0000 mg | DELAYED_RELEASE_TABLET | Freq: Every day | ORAL | 0 refills | Status: DC

## 2017-04-07 NOTE — Patient Instructions (Addendum)
Your physician has recommended you make the following change in your medication:  1)Increase lasix to 40mg  twice a day for 3 days and then go back to normal dose  2)Start Protonix 40mg  once a day for 30 days.

## 2017-04-07 NOTE — Telephone Encounter (Signed)
Patient calling and states that he had an Afib Ablation on 04/03/17 and that he has been running a fever of 101.2. Patient states that his stomach feels swollen and aches a little. He states that he feels weak and SOB with exertion. He states that he has been taking his meds as prescribed and that he has lost 4 lbs since surgery. Patient states that his incisions look good. Patient denies having any other symptoms at this time. Discussed with Tommye Standard who advised for the patient to be seen at [facility] today to look at incisions and be evaluated for fever post ablation. Called Afib Clinic who agrees to see patient today at 2:30PM. Called and made patient aware. Patient states that he will be at the appointment this afternoon at 2:30PM at the [facility].

## 2017-04-07 NOTE — Anesthesia Postprocedure Evaluation (Addendum)
Anesthesia Post Note  Patient: Shane [name]  Procedure(s) Performed: Procedure(s) (LRB): Atrial Fibrillation Ablation (N/A)     Patient location during evaluation: Cath Lab Anesthesia Type: General Level of consciousness: awake and alert Pain management: pain level controlled Vital Signs Assessment: post-procedure vital signs reviewed and stable Respiratory status: spontaneous breathing, nonlabored ventilation, respiratory function stable and patient connected to nasal cannula oxygen Cardiovascular status: stable Postop Assessment: no signs of nausea or vomiting Anesthetic complications: no    Last Vitals:  Vitals:   04/04/17 0500 04/04/17 0757  BP: 126/60 119/78  Pulse: 60 60  Resp: 19 19  Temp: 36.6 C 36.8 C    Last Pain:  Vitals:   04/04/17 0757  TempSrc: Oral  PainSc:                  [name]

## 2017-04-07 NOTE — Telephone Encounter (Signed)
Mr. Shane [name] is calling because he has been running a low grade fever of 101.2 . He is having a lot of swelling, stomach seems to be swollen, some shortness of breath and weakness. The fever is what he is mostly concerned about . Please call   Thanks

## 2017-04-08 ENCOUNTER — Other Ambulatory Visit (HOSPITAL_COMMUNITY): Payer: Self-pay | Admitting: *Deleted

## 2017-04-08 LAB — URINE CULTURE
Culture: NO GROWTH
Special Requests: NORMAL

## 2017-04-08 MED ORDER — POTASSIUM CHLORIDE CRYS ER 20 MEQ PO TBCR: 20.0000 meq | EXTENDED_RELEASE_TABLET | Freq: Every day | ORAL | 0 refills | Status: DC

## 2017-04-08 NOTE — Progress Notes (Signed)
Primary Care Physician: Albina Billet, MD Referring Physician: [facility] Triage   Shane [name] is a 71 y.o. male with a h/o PPM, afib that had an ablation 04/3127 with Dr.Camnitz. He called [address] this am with chills last night and fever of 101 degrees this am. He took tylenol. He is bing seen in the afib clinic to address fever. He states his throat is a little scratchy but no particular cough.Urine has been darker than usual for the last few days but no odor or burning. He has felt more bloated in his abdominal area, mildly short of breath and weight is up 6 lbs. He does not appear to be in any acute distress. Afebrile in the office. Has been burping since procedure, no dysphagia. No c/o groin issues.   Today, he denies symptoms of palpitations, chest pain, orthopnea, PND, lower extremity edema, dizziness, presyncope, syncope, or neurologic sequela. Positive for fever, mild shortness of breath, weight gain.The patient is tolerating medications without difficulties and is otherwise without complaint today.   Past Medical History:  Diagnosis Date  . Atrioventricular block   . Degenerative joint disease   . Paroxysmal atrial fibrillation (HCC)   . 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 Sinus node dysfunction [facility])    Past Surgical History:  Procedure Laterality Date  . ATRIAL FIBRILLATION ABLATION N/A 04/03/2017   Procedure: Atrial Fibrillation Ablation;  Surgeon: Constance Haw, MD;  Location: [facility];  Service: Cardiovascular;  Laterality: N/A;  . CARDIAC CATHETERIZATION  10/13/1994   No CAD  . CARDIAC CATHETERIZATION  05/10/2003   No CAD  . CARDIOVERSION N/A 10/21/2016   Procedure: CARDIOVERSION;  Surgeon: Sanda Klein, MD;  Location: [facility];  Service: Cardiovascular;  Laterality: N/A;  . NM [place]  05/07/2011   Lexiscan: No ishcemia  . PERMANENT PACEMAKER INSERTION  03/12/2010   guidant  . ROTATOR CUFF REPAIR   1996  . US ECHOCARDIOGRAPHY  05/07/2011   mod. LVH,LA mod. dilated,borderline aortic root dilatation    Current Outpatient Prescriptions  Medication Sig Dispense Refill  . Ascorbic Acid (VITAMIN C) 1000 MG tablet Take 1,000 mg by mouth daily.    . B Complex-C-Folic Acid (STRESS FORMULA) TABS Take by mouth daily.    . Calcium Citrate (CITRACAL PO) Take by mouth daily.    . celecoxib (CELEBREX) 200 MG capsule Take 1 capsule (200 mg total) by mouth daily. 30 capsule 6  . Cholecalciferol (VITAMIN D-3) 1000 UNITS CAPS Take 1 capsule by mouth daily.    . citalopram (CELEXA) 40 MG tablet Take 1 tablet (40 mg total) by mouth daily. MUST RECEIVE FUTURE REFILLS FROM PCP. 30 tablet 0  . digoxin (LANOXIN) 0.125 MG tablet Take 1 tablet (0.125 mg total) by mouth daily. 90 tablet 3  . diltiazem (CARDIZEM CD) 240 MG 24 hr capsule Take 1 capsule (240 mg total) by mouth daily. 30 capsule 10  . doxazosin (CARDURA) 4 MG tablet Take 4 mg by mouth at bedtime.    Marland Kitchen ELIQUIS 5 MG TABS tablet Take 1 tablet (5 mg total) by mouth 2 (two) times daily. 60 tablet 9  . furosemide (LASIX) 40 MG tablet Take 1 tablet (40 mg total) by mouth daily. 30 tablet 9  . indomethacin (INDOCIN) 50 MG capsule Take 50 mg by mouth as needed (Gout).    . metoprolol succinate (TOPROL-XL) 50 MG 24 hr tablet Take 1 tablet (50 mg total) by  mouth daily. 30 tablet 11  . montelukast (SINGULAIR) 10 MG tablet Take 10 mg by mouth at bedtime.    . Omega-3 Fatty Acids (FISH OIL) 1200 MG CAPS Take 1,200 mg by mouth 2 (two) times daily.     Marland Kitchen testosterone (ANDROGEL) 50 MG/5GM GEL Place 5 g onto the skin daily.    . vitamin B-12 (CYANOCOBALAMIN) 1000 MCG tablet Take 1,000 mcg by mouth daily.    . pantoprazole (PROTONIX) 40 MG tablet Take 1 tablet (40 mg total) by mouth daily. 30 tablet 0   No current facility-administered medications for this encounter.     No Known Allergies  Social History   Social History  . Marital status: Married    Spouse  name: N/A  . Number of children: N/A  . Years of education: N/A   Occupational History  . Not on file.   Social History Main Topics  . Smoking status: Former Smoker    Types: Cigars  . Smokeless tobacco: Never Used  . Alcohol use Yes     Comment: social  . Drug use: No  . Sexual activity: Not on file   Other Topics Concern  . Not on file   Social History Narrative  . No narrative on file    Family History  Problem Relation Age of Onset  . Stroke Mother   . Heart failure Mother     ROS- All systems are reviewed and negative except as per the HPI above  Physical Exam: Vitals:   04/07/17 1433  BP: (!) 104/56  Pulse: 60  Temp: 98.7 F (37.1 C)  SpO2: 92%  Weight: 285 lb 6.4 oz (129.5 kg)  Height: 5\' 11"  (1.803 m)   Wt Readings from Last 3 Encounters:  04/07/17 285 lb 6.4 oz (129.5 kg)  04/04/17 279 lb 3.2 oz (126.6 kg)  03/23/17 279 lb 3.2 oz (126.6 kg)    Labs: Lab Results  Component Value Date   NA 137 04/07/2017   K 3.4 (L) 04/07/2017   CL 102 04/07/2017   CO2 25 04/07/2017   GLUCOSE 100 (H) 04/07/2017   BUN 18 04/07/2017   CREATININE 1.05 04/07/2017   CALCIUM 8.9 04/07/2017   Lab Results  Component Value Date   INR 1.1 10/14/2016   No results found for: CHOL, HDL, LDLCALC, TRIG   GEN- The patient is well appearing, alert and oriented x 3 today.   Head- normocephalic, atraumatic Eyes-  Sclera clear, conjunctiva pink Ears- hearing intact Oropharynx- clear Neck- supple, no JVP Lymph- no cervical lymphadenopathy Lungs- Clear to ausculation bilaterally, normal work of breathing Heart- Regular rate and rhythm, no murmurs, rubs or gallops, PMI not laterally displaced GI- soft, NT, ND, + BS Extremities- no clubbing, cyanosis, or edema MS- no significant deformity or atrophy Skin- no rash or lesion Psych- euthymic mood, full affect Neuro- strength and sensation are intact  EKG-AV dual paced rhythm at 60 bpm, PR int 158 ms, qrs int 206 ms, qtc  516 ms Epic records reviewed    Assessment and Plan: 1. Afib S/p ablation Paced rhythm on EKG Will start protonix x one month for burping  2. Fever Discussed with Dr. Curt Bears Not toxic appearing U/A, CXR CBC, CMET,blood culture x 1  3. Edema Will double lasix x 3 days  May need antibiotics depending on above tests If not improved will have f/u with Dr. Curt Bears on Thursday  Ysabelle Goodroe C. [facility] [place],  [place] [address] [phone]

## 2017-04-09 ENCOUNTER — Ambulatory Visit: Payer: BLUE CROSS/BLUE SHIELD | Admitting: Cardiology

## 2017-04-09 ENCOUNTER — Telehealth (HOSPITAL_COMMUNITY): Payer: Self-pay | Admitting: Nurse Practitioner

## 2017-04-09 NOTE — Telephone Encounter (Signed)
Checked in with pt this am. No further chills. Temp this am is 98. His weight is down around 10 lbs with double lasix last two days. He will take again then am and go back to usual dose. He is feeling better. Urine culture clean. Blood culture clean. Wbc normal. He will not need to f/u with Camnitz today. F/u here as scheduled.

## 2017-04-12 LAB — CULTURE, BLOOD (ROUTINE X 2)
CULTURE: NO GROWTH
Special Requests: ADEQUATE

## 2017-04-22 ENCOUNTER — Encounter: Payer: Self-pay | Admitting: Cardiovascular Disease

## 2017-04-22 ENCOUNTER — Ambulatory Visit (INDEPENDENT_AMBULATORY_CARE_PROVIDER_SITE_OTHER): Payer: BLUE CROSS/BLUE SHIELD | Admitting: Cardiovascular Disease

## 2017-04-22 VITALS — BP 100/70 | HR 125 | Ht 71.0 in | Wt 267.0 lb

## 2017-04-22 DIAGNOSIS — I5032 Chronic diastolic (congestive) heart failure: Secondary | ICD-10-CM

## 2017-04-22 DIAGNOSIS — I484 Atypical atrial flutter: Secondary | ICD-10-CM | POA: Diagnosis not present

## 2017-04-22 DIAGNOSIS — G4733 Obstructive sleep apnea (adult) (pediatric): Secondary | ICD-10-CM | POA: Diagnosis not present

## 2017-04-22 DIAGNOSIS — I481 Persistent atrial fibrillation: Secondary | ICD-10-CM | POA: Diagnosis not present

## 2017-04-22 DIAGNOSIS — Z950 Presence of cardiac pacemaker: Secondary | ICD-10-CM | POA: Diagnosis not present

## 2017-04-22 DIAGNOSIS — I952 Hypotension due to drugs: Secondary | ICD-10-CM

## 2017-04-22 DIAGNOSIS — I4819 Other persistent atrial fibrillation: Secondary | ICD-10-CM

## 2017-04-22 LAB — BASIC METABOLIC PANEL WITH GFR
BUN/Creatinine Ratio: 14 (ref 10–24)
BUN: 14 mg/dL (ref 8–27)
CO2: 24 mmol/L (ref 20–29)
Calcium: 9.5 mg/dL (ref 8.6–10.2)
Chloride: 99 mmol/L (ref 96–106)
Creatinine, Ser: 1.03 mg/dL (ref 0.76–1.27)
GFR calc Af Amer: 84 mL/min/1.73
GFR calc non Af Amer: 72 mL/min/1.73
Glucose: 103 mg/dL — ABNORMAL HIGH (ref 65–99)
Potassium: 4.2 mmol/L (ref 3.5–5.2)
Sodium: 140 mmol/L (ref 134–144)

## 2017-04-22 LAB — MAGNESIUM: Magnesium: 2 mg/dL (ref 1.6–2.3)

## 2017-04-22 MED ORDER — TAMSULOSIN HCL 0.4 MG PO CAPS
0.4000 mg | ORAL_CAPSULE | Freq: Every day | ORAL | 3 refills | Status: DC
Start: 2017-04-22 — End: 2017-11-13

## 2017-04-22 NOTE — Patient Instructions (Addendum)
Medication Instructions: Dr Sallyanne Kuster has recommended making the following medication changes: 1. STOP Cardura 2. START Tamsulosin 0.4 mg tablets - take 1 tablet by mouth daily  Labwork: Your physician recommends that you return for lab work at your earliest [place].  Testing/Procedures: NONE ORDERED  Follow-up: Dr Sallyanne Kuster recommends that you schedule a follow-up appointment in 5 months. You will receive a reminder letter in the mail two months in advance. If you don't receive a letter, please call our office to schedule the follow-up appointment.  If you need a refill on your cardiac medications before your next appointment, please call your pharmacy.

## 2017-04-22 NOTE — Progress Notes (Signed)
Patient ID: Shane [name], male   DOB: 1946-04-10, 71 y.o.   MRN: [id number]     Cardiology Office Note    Date:  04/22/2017   [name], Shane [name] 16-Aug-1946, MRN [id number]  PCP:  Albina Billet, MD  Cardiologist:   Sanda Klein, MD   No chief complaint on file.   History of Present Illness:  Shane [name] is a 71 y.o. male who presents for persistent atrial fibrillation and pacemaker check.  He underwent A. fib radiofrequency ablation on August 3 it was in normal rhythm the following day. On August 7 when he saw Butch Penny and the A. fib clinic the rhythm was AV sequentially paced at 60 bpm. He had transient low-grade fever and some chills after that that resolved spontaneously. Fluid gain resolved after increasing the furosemide dose.  Today he presents with atrial flutter with 2:1 AV block and a ventricular rate of 125 bpm, but is unaware of the arrhythmia. Device check shows that there is an some undersensing of the flutter waves and it's hard to tell exactly when the arrhythmia began. Attempts at overdrive pacing in the clinic today, with cycle lengths of 200 ms, 190 ms, 180 ms were all unsuccessful and eventually led to rhythm deterioration to atrial fibrillation, but with greatly improved ventricular rate control.  He has generally felt okay, a little dizzy when the weather is hot. His systolic blood pressures around 100. He takes metoprolol and diltiazem as well as digoxin for rate control. He is also on doxazosin. He denies syncope or near-syncope. He denies exertional angina or dyspnea and is oblivious to palpitations. He has not had edema since he took the increased dose of diuretic.  He has been conscientiously trying to follow a low carb, low calorie diet and has lost 17 pounds since his last office appointment (some of this was fluid, but he is probably lost 12 or 13 pounds of real weight).  He recently took a course of antibiotics for some dental work. He complains of  constipation.  Other than the arrhythmia described above, his pacemaker function is normal with estimated generator longevity of 3 years. Lead parameters remain excellent.  Attempted cardioversion on February 20 failed after 2 shocks at 200 J. Echo on March 8 shows normal left ventricular systolic function with EF 50-55%, s mild LVH. The left atrium was described as mildly dilated, but measured at 5.2 cm end-systolic diameter (which would be severely dilated), and systolic volume index 33 mL/meter squared (mildly dilated). 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   Past Medical History:  Diagnosis Date  . Atrioventricular block   . Degenerative joint disease   . Paroxysmal atrial fibrillation (HCC)   . 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 Sinus node dysfunction [facility])     Past Surgical History:  Procedure Laterality Date  . ATRIAL FIBRILLATION ABLATION N/A 04/03/2017   Procedure: Atrial Fibrillation Ablation;  Surgeon: Constance Haw, MD;  Location: [place] [facility];  Service: Cardiovascular;  Laterality: N/A;  . CARDIAC CATHETERIZATION  10/13/1994   No CAD  . CARDIAC CATHETERIZATION  05/10/2003   No CAD  . CARDIOVERSION N/A 10/21/2016   Procedure: CARDIOVERSION;  Surgeon: Sanda Klein, MD;  Location: [place];  Service: Cardiovascular;  Laterality: N/A;  . NM [place]  05/07/2011   Lexiscan: No ishcemia  . PERMANENT  PACEMAKER INSERTION  03/12/2010   guidant  . ROTATOR CUFF REPAIR  1996  . US ECHOCARDIOGRAPHY  05/07/2011   mod. LVH,LA mod. dilated,borderline aortic root dilatation    Outpatient Medications Prior to Visit  Medication Sig Dispense Refill  . Ascorbic Acid (VITAMIN C) 1000 MG tablet Take 1,000 mg by mouth daily.    . B Complex-C-Folic Acid (STRESS FORMULA) TABS Take by mouth daily.    . Calcium  Citrate (CITRACAL PO) Take by mouth daily.    . celecoxib (CELEBREX) 200 MG capsule Take 1 capsule (200 mg total) by mouth daily. 30 capsule 6  . Cholecalciferol (VITAMIN D-3) 1000 UNITS CAPS Take 1 capsule by mouth daily.    . citalopram (CELEXA) 40 MG tablet Take 1 tablet (40 mg total) by mouth daily. MUST RECEIVE FUTURE REFILLS FROM PCP. 30 tablet 0  . digoxin (LANOXIN) 0.125 MG tablet Take 1 tablet (0.125 mg total) by mouth daily. 90 tablet 3  . diltiazem (CARDIZEM CD) 240 MG 24 hr capsule Take 1 capsule (240 mg total) by mouth daily. 30 capsule 10  . ELIQUIS 5 MG TABS tablet Take 1 tablet (5 mg total) by mouth 2 (two) times daily. 60 tablet 9  . furosemide (LASIX) 40 MG tablet Take 1 tablet (40 mg total) by mouth daily. 30 tablet 9  . indomethacin (INDOCIN) 50 MG capsule Take 50 mg by mouth as needed (Gout).    . metoprolol succinate (TOPROL-XL) 50 MG 24 hr tablet Take 1 tablet (50 mg total) by mouth daily. 30 tablet 11  . montelukast (SINGULAIR) 10 MG tablet Take 10 mg by mouth at bedtime.    . Omega-3 Fatty Acids (FISH OIL) 1200 MG CAPS Take 1,200 mg by mouth 2 (two) times daily.     . pantoprazole (PROTONIX) 40 MG tablet Take 1 tablet (40 mg total) by mouth daily. 30 tablet 0  . testosterone (ANDROGEL) 50 MG/5GM GEL Place 5 g onto the skin daily.    . vitamin B-12 (CYANOCOBALAMIN) 1000 MCG tablet Take 1,000 mcg by mouth daily.    Marland Kitchen doxazosin (CARDURA) 4 MG tablet Take 4 mg by mouth at bedtime.    . potassium chloride SA (K-DUR,KLOR-CON) 20 MEQ tablet Take 1 tablet (20 mEq total) by mouth daily. 7 tablet 0   No facility-administered medications prior to visit.      Allergies:   Patient has no known allergies.   Social History   Social History  . Marital status: Married    Spouse name: N/A  . Number of children: N/A  . Years of education: N/A   Social History Main Topics  . Smoking status: Former Smoker    Types: Cigars  . Smokeless tobacco: Never Used  . Alcohol use Yes      Comment: social  . Drug use: No  . Sexual activity: Not Asked   Other Topics Concern  . None   Social History Narrative  . None     Family History:  The patient's family history includes Heart failure in his mother; Stroke in his mother.   ROS:   Please see the history of present illness.    ROS All other systems reviewed and are negative.   PHYSICAL EXAM:   VS:  BP 100/70   Pulse (!) 125   Ht 5\' 11"  (1.803 m)   Wt 267 lb (121.1 kg)   BMI 37.24 kg/m     General: Alert, oriented x3, no distress. Severely obese Head: no evidence  of trauma, PERRL, EOMI, no exophtalmos or lid lag, no myxedema, no xanthelasma; normal ears, nose and oropharynx Neck: normal jugular venous pulsations and no hepatojugular reflux; brisk carotid pulses without delay and no carotid bruits Chest: clear to auscultation, no signs of consolidation by percussion or palpation, normal fremitus, symmetrical and full respiratory excursions Cardiovascular: normal position and quality of the apical impulse, rapid regular rhythm, normal first and second heart sounds, no murmurs, rubs or gallops. Healthy left subclavian pacemaker site Abdomen: no tenderness or distention, no masses by palpation, no abnormal pulsatility or arterial bruits, normal bowel sounds, no hepatosplenomegaly Extremities: no clubbing, cyanosis or edema; 2+ radial, ulnar and brachial pulses bilaterally; 2+ right femoral, posterior tibial and dorsalis pedis pulses; 2+ left femoral, posterior tibial and dorsalis pedis pulses; no subclavian or femoral bruits Neurological: grossly nonfocal Psych: euthymic mood, full affect  Wt Readings from Last 3 Encounters:  04/22/17 267 lb (121.1 kg)  04/07/17 285 lb 6.4 oz (129.5 kg)  04/04/17 279 lb 3.2 oz (126.6 kg)      Studies/Labs Reviewed:   EKG:  EKG is ordered today.  The ekg ordered today demonstrates Atrial flutter with 2:1 AV conduction, ST segment depression and T-wave inversion in leads V3-V6 as  well as the inferior leads, QTC 453 ms  ASSESSMENT:    1. Atypical atrial flutter (HCC)   2. Persistent atrial fibrillation ([employer])   3. Hypotension due to drugs   4. Obesity, morbid (more than 100 lbs over ideal weight or BMI > 40) (HCC)   5. Pacemaker for sinus node dysfunction   6. Chronic diastolic CHF (congestive heart failure) ([place])   7. OSA (obstructive sleep apnea)      PLAN:  In order of problems listed above:  1. AFlutter: Associated with rapid ventricular rates. Attempted overdrive pacing was unsuccessful. Never had convincing evidence of rhythm and treatment. Suspect left atrial flutter. The rhythm eventually deteriorated to atrial fibrillation. We'll recheck his electrolytes today.   2. AFib s/p RFA 04/03/2017: Rate control better and atrial flutter been in atrial flutter, but still requiring 3 medications (digoxin, diltiazem, metoprolol). The dose of these medications is limited by his blood pressure. Suspect the constipation is also related to the diltiazem. Discussed the fact that atrial arrhythmias are quite common in the first 3 months after ablation. He has a follow-up visit with Roderic Palau in the A. fib clinic on September 4. Is still in atrial fibrillation at that time I would still probably set him up for a cardioversion. He then has follow-up appointment with Dr Curt Bears in early November. 3. Hypotension: We'll try to switch from doxazosin to an alpha blocker with less hypotensive effects such as tamsulosin 4. Obesity:  He is aware of the direct relationship between weight and burden of atrial fibrillation. He has been an excellent job so far, losing 12 pounds in one month. Discussed low glycemic index, low carb diet in detail. Both he and his wife are trying to lose weight. 5. PM: Normal device function. Continue downloads Q 3 months. 6. CHF: Appears clinically euvolemic today. Will have to constantly reassess his "dry weight" as he is losing real weight. 7. OSA:  reports 100% compliance with CPAP     Medication Adjustments/Labs and Tests Ordered: Current medicines are reviewed at length with the patient today.  Concerns regarding medicines are outlined above.  Medication changes, Labs and Tests ordered today are listed in the Patient Instructions below. Patient Instructions  Medication Instructions: Dr Sallyanne Kuster has  recommended making the following medication changes: 1. STOP Cardura 2. START Tamsulosin 0.4 mg tablets - take 1 tablet by mouth daily  Labwork: Your physician recommends that you return for lab work at your earliest [place].  Testing/Procedures: NONE ORDERED  Follow-up: Dr Sallyanne Kuster recommends that you schedule a follow-up appointment in 5 months. You will receive a reminder letter in the mail two months in advance. If you don't receive a letter, please call our office to schedule the follow-up appointment.  If you need a refill on your cardiac medications before your next appointment, please call your pharmacy.      Signed, Sanda Klein, MD  04/22/2017 10:08 AM    [facility] [place], [place], [place]  [address] Phone: [phone]; Fax: [phone]

## 2017-04-22 NOTE — Progress Notes (Signed)
Should be just shock him once, or start him on antiarrhythmics first? [facility]

## 2017-05-05 ENCOUNTER — Other Ambulatory Visit: Payer: Self-pay

## 2017-05-05 ENCOUNTER — Ambulatory Visit (HOSPITAL_COMMUNITY)
Admission: RE | Admit: 2017-05-05 | Discharge: 2017-05-05 | Disposition: A | Discharge status: Home / Self Care | Payer: BLUE CROSS/BLUE SHIELD | Source: Ambulatory Visit | Attending: Nurse Practitioner | Admitting: Nurse Practitioner

## 2017-05-05 ENCOUNTER — Encounter (HOSPITAL_COMMUNITY): Payer: Self-pay | Admitting: Nurse Practitioner

## 2017-05-05 VITALS — BP 112/68 | HR 95 | Ht 71.0 in | Wt 263.0 lb

## 2017-05-05 DIAGNOSIS — R509 Fever, unspecified: Secondary | ICD-10-CM | POA: Insufficient documentation

## 2017-05-05 DIAGNOSIS — Z9889 Other specified postprocedural states: Secondary | ICD-10-CM | POA: Insufficient documentation

## 2017-05-05 DIAGNOSIS — I4819 Other persistent atrial fibrillation: Secondary | ICD-10-CM

## 2017-05-05 DIAGNOSIS — Z950 Presence of cardiac pacemaker: Secondary | ICD-10-CM | POA: Insufficient documentation

## 2017-05-05 DIAGNOSIS — Z87891 Personal history of nicotine dependence: Secondary | ICD-10-CM | POA: Diagnosis not present

## 2017-05-05 DIAGNOSIS — M109 Gout, unspecified: Secondary | ICD-10-CM | POA: Diagnosis not present

## 2017-05-05 DIAGNOSIS — R609 Edema, unspecified: Secondary | ICD-10-CM | POA: Diagnosis not present

## 2017-05-05 DIAGNOSIS — Z79899 Other long term (current) drug therapy: Secondary | ICD-10-CM | POA: Diagnosis not present

## 2017-05-05 DIAGNOSIS — I4891 Unspecified atrial fibrillation: Secondary | ICD-10-CM | POA: Diagnosis present

## 2017-05-05 DIAGNOSIS — I480 Paroxysmal atrial fibrillation: Secondary | ICD-10-CM | POA: Insufficient documentation

## 2017-05-05 DIAGNOSIS — Z7901 Long term (current) use of anticoagulants: Secondary | ICD-10-CM | POA: Insufficient documentation

## 2017-05-05 DIAGNOSIS — I481 Persistent atrial fibrillation: Secondary | ICD-10-CM

## 2017-05-05 LAB — CBC
HEMATOCRIT: 44.8 % (ref 39.0–52.0)
HEMOGLOBIN: 15.6 g/dL (ref 13.0–17.0)
MCH: 32.7 pg (ref 26.0–34.0)
MCHC: 34.8 g/dL (ref 30.0–36.0)
MCV: 93.9 fL (ref 78.0–100.0)
Platelets: 231 10*3/uL (ref 150–400)
RBC: 4.77 MIL/uL (ref 4.22–5.81)
RDW: 13 % (ref 11.5–15.5)
WBC: 6.2 10*3/uL (ref 4.0–10.5)

## 2017-05-05 LAB — COMPREHENSIVE METABOLIC PANEL
ALBUMIN: 3.8 g/dL (ref 3.5–5.0)
ALT: 40 U/L (ref 17–63)
ANION GAP: 8 (ref 5–15)
AST: 31 U/L (ref 15–41)
Alkaline Phosphatase: 35 U/L — ABNORMAL LOW (ref 38–126)
BILIRUBIN TOTAL: 1.4 mg/dL — AB (ref 0.3–1.2)
BUN: 14 mg/dL (ref 6–20)
CO2: 28 mmol/L (ref 22–32)
Calcium: 9.6 mg/dL (ref 8.9–10.3)
Chloride: 101 mmol/L (ref 101–111)
Creatinine, Ser: 0.98 mg/dL (ref 0.61–1.24)
GFR calc Af Amer: >60 mL/min (ref >60)
GFR calc non Af Amer: >60 mL/min (ref >60)
GLUCOSE: 112 mg/dL — AB (ref 65–99)
POTASSIUM: 4.2 mmol/L (ref 3.5–5.1)
Sodium: 137 mmol/L (ref 135–145)
TOTAL PROTEIN: 7.3 g/dL (ref 6.5–8.1)

## 2017-05-05 MED ORDER — DILTIAZEM HCL ER COATED BEADS 240 MG PO CP24: ORAL_CAPSULE | ORAL | 10 refills | Status: DC

## 2017-05-05 NOTE — Progress Notes (Signed)
Primary Care Physician: Albina Billet, MD Referring Physician: [facility] Triage Cardiologist: Dr. Loletha Grayer EP: Dr. Dierdre Harness is a 71 y.o. male with a h/o PPM, afib that had an ablation 04/3127 with Dr.Camnitz. He called [address] with   chills right after ablation that night and fever of 101 degrees the following morning.Marland Kitchen He took tylenol. He is bing seen in the afib clinic to address fever. He states his throat is a little scratchy but no particular cough.Urine has been darker than usual for the last few days but no odor or burning. He has felt more bloated in his abdominal area, mildly short of breath and weight is up 6 lbs. He does not appear to be in any acute distress. Afebrile in the office. Has been burping since procedure, no dysphagia. No c/o groin issues.   Fever did resolve by the next day and urine/blood cultures negative. Increased lasix helped for him to lose fluid weight and he felt better with less shortness of breath. He had f/u with Dr. Loletha Grayer, 8/22, and interrogation of his device showed atrial flutter. He was burst paced from atrial flutter to atrial fib with better rate control. Today he remains in afib, confirmed by [facility] rep. Discussed with Dr. Shonna Chock and he will be set up for cardioversion. He has also had some recent gout issues. He has been on a low carb diet and has lost 20 lbs.  Today, he denies symptoms of palpitations, chest pain, orthopnea, PND, lower extremity edema, dizziness, presyncope, syncope, or neurologic sequela. Positive for fever, mild shortness of breath, weight gain.The patient is tolerating medications without difficulties and is otherwise without complaint today.   Past Medical History:  Diagnosis Date  . Atrioventricular block   . Degenerative joint disease   . Paroxysmal atrial fibrillation (HCC)   . 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 Sinus node dysfunction [facility])    Past Surgical History:  Procedure  Laterality Date  . ATRIAL FIBRILLATION ABLATION N/A 04/03/2017   Procedure: Atrial Fibrillation Ablation;  Surgeon: Constance Haw, MD;  Location: [place] [facility];  Service: Cardiovascular;  Laterality: N/A;  . CARDIAC CATHETERIZATION  10/13/1994   No CAD  . CARDIAC CATHETERIZATION  05/10/2003   No CAD  . CARDIOVERSION N/A 10/21/2016   Procedure: CARDIOVERSION;  Surgeon: Sanda Klein, MD;  Location: [place];  Service: Cardiovascular;  Laterality: N/A;  . NM [place]  05/07/2011   Lexiscan: No ishcemia  . PERMANENT PACEMAKER INSERTION  03/12/2010   guidant  . ROTATOR CUFF REPAIR  1996  . US ECHOCARDIOGRAPHY  05/07/2011   mod. LVH,LA mod. dilated,borderline aortic root dilatation    Current Outpatient Prescriptions  Medication Sig Dispense Refill  . anastrozole (ARIMIDEX) 1 MG tablet Take 1 tablet by mouth daily.    . Ascorbic Acid (VITAMIN C) 1000 MG tablet Take 1,000 mg by mouth daily.    . B Complex-C-Folic Acid (STRESS FORMULA) TABS Take by mouth daily.    . Calcium Citrate (CITRACAL PO) Take by mouth daily.    . Cholecalciferol (VITAMIN D-3) 1000 UNITS CAPS Take 1 capsule by mouth daily.    . citalopram (CELEXA) 40 MG tablet Take 1 tablet (40 mg total) by mouth daily. MUST RECEIVE FUTURE REFILLS FROM PCP. 30 tablet 0  . digoxin (LANOXIN) 0.125 MG tablet Take 1 tablet (0.125 mg total) by mouth daily. 90 tablet 3  . ELIQUIS 5 MG  TABS tablet Take 1 tablet (5 mg total) by mouth 2 (two) times daily. 60 tablet 9  . furosemide (LASIX) 40 MG tablet Take 1 tablet (40 mg total) by mouth daily. 30 tablet 9  . indomethacin (INDOCIN) 50 MG capsule Take 50 mg by mouth as needed (Gout).    . metoprolol succinate (TOPROL-XL) 50 MG 24 hr tablet Take 1 tablet (50 mg total) by mouth daily. 30 tablet 11  . montelukast (SINGULAIR) 10 MG tablet Take 10 mg by mouth at bedtime.    . Omega-3 Fatty Acids (FISH OIL) 1200 MG CAPS Take 1,200 mg by mouth 2 (two) times daily.     .  pantoprazole (PROTONIX) 40 MG tablet Take 1 tablet (40 mg total) by mouth daily. 30 tablet 0  . polyethylene glycol (MIRALAX / GLYCOLAX) packet Take 17 g by mouth daily.    . tamsulosin (FLOMAX) 0.4 MG CAPS capsule Take 1 capsule (0.4 mg total) by mouth daily. 90 capsule 3  . testosterone (ANDROGEL) 50 MG/5GM GEL Place 5 g onto the skin daily.    . vitamin B-12 (CYANOCOBALAMIN) 1000 MCG tablet Take 1,000 mcg by mouth daily.    . celecoxib (CELEBREX) 200 MG capsule Take 1 capsule (200 mg total) by mouth daily. (Patient not taking: Reported on 05/05/2017) 30 capsule 6  . diltiazem (CARDIZEM CD) 240 MG 24 hr capsule Take 1 capsule (240 mg total) by mouth daily. 30 capsule 10  . potassium chloride SA (K-DUR,KLOR-CON) 20 MEQ tablet Take 1 tablet (20 mEq total) by mouth daily. (Patient not taking: Reported on 05/05/2017) 7 tablet 0   No current facility-administered medications for this encounter.     No Known Allergies  Social History   Social History  . Marital status: Married    Spouse name: N/A  . Number of children: N/A  . Years of education: N/A   Occupational History  . Not on file.   Social History Main Topics  . Smoking status: Former Smoker    Types: Cigars  . Smokeless tobacco: Never Used  . Alcohol use Yes     Comment: social  . Drug use: No  . Sexual activity: Not on file   Other Topics Concern  . Not on file   Social History Narrative  . No narrative on file    Family History  Problem Relation Age of Onset  . Stroke Mother   . Heart failure Mother     ROS- All systems are reviewed and negative except as per the HPI above  Physical Exam: Vitals:   05/05/17 0938  BP: 112/68  Pulse: 95  Weight: 263 lb (119.3 kg)  Height: 5\' 11"  (1.803 m)   Wt Readings from Last 3 Encounters:  05/05/17 263 lb (119.3 kg)  04/22/17 267 lb (121.1 kg)  04/07/17 285 lb 6.4 oz (129.5 kg)    Labs: Lab Results  Component Value Date   NA 137 05/05/2017   K 4.2 05/05/2017    CL 101 05/05/2017   CO2 28 05/05/2017   GLUCOSE 112 (H) 05/05/2017   BUN 14 05/05/2017   CREATININE 0.98 05/05/2017   CALCIUM 9.6 05/05/2017   MG 2.0 04/22/2017   Lab Results  Component Value Date   INR 1.1 10/14/2016   No results found for: CHOL, HDL, LDLCALC, TRIG   GEN- The patient is well appearing, alert and oriented x 3 today.   Head- normocephalic, atraumatic Eyes-  Sclera clear, conjunctiva pink Ears- hearing intact Oropharynx- clear Neck-  supple, no JVP Lymph- no cervical lymphadenopathy Lungs- Clear to ausculation bilaterally, normal work of breathing Heart- irregular rate and rhythm, no murmurs, rubs or gallops, PMI not laterally displaced GI- soft, NT, ND, + BS Extremities- no clubbing, cyanosis, or edema MS- no significant deformity or atrophy Skin- no rash or lesion Psych- euthymic mood, full affect Neuro- strength and sensation are intact  EKG-afib at 95 bpm, qrs int 110 ms, qtc 432 ms with v paced complexes Epic records reviewed    Assessment and Plan: 1. Afib S/p ablation Initially in SR after ablation, but has returned to persistent afib, confirmed with interrogation of device Discussed with Dr. Curt Bears and will plan for cardioversion No missed doses of eliquis  2. Fever 48-36 hours after ablation Resolved  3. Edema Resolved  with extra lasix for 3 days after ablation  4. 20 lb intentional weight loss Congratulated on his weigh loss results  5. Recent gout Having to take indocin short term for management  Butch Penny C. Gabor Lusk, [facility] [address] [place], [place] [address] [phone]

## 2017-05-05 NOTE — Patient Instructions (Signed)
Cardioversion scheduled for Friday, September 14th  - Arrive at the [facility] and go to admitting at 9:30am  -Do not eat or drink anything after midnight the night prior to your procedure.  - Take all your medication with a sip of water prior to arrival.  - You will not be able to drive home after your procedure.

## 2017-05-15 ENCOUNTER — Ambulatory Visit (HOSPITAL_COMMUNITY): Payer: BLUE CROSS/BLUE SHIELD | Admitting: Anesthesiology

## 2017-05-15 ENCOUNTER — Ambulatory Visit (HOSPITAL_COMMUNITY)
Admission: RE | Admit: 2017-05-15 | Discharge: 2017-05-15 | Disposition: A | Discharge status: Home / Self Care | Payer: BLUE CROSS/BLUE SHIELD | Source: Ambulatory Visit | Attending: Internal Medicine | Admitting: Internal Medicine

## 2017-05-15 ENCOUNTER — Encounter (HOSPITAL_COMMUNITY): Payer: Self-pay | Admitting: Emergency Medicine

## 2017-05-15 ENCOUNTER — Encounter (HOSPITAL_COMMUNITY)
Admission: RE | Disposition: A | Discharge status: Home / Self Care | Payer: Self-pay | Source: Ambulatory Visit | Attending: Internal Medicine

## 2017-05-15 DIAGNOSIS — Z7901 Long term (current) use of anticoagulants: Secondary | ICD-10-CM | POA: Insufficient documentation

## 2017-05-15 DIAGNOSIS — G4730 Sleep apnea, unspecified: Secondary | ICD-10-CM | POA: Insufficient documentation

## 2017-05-15 DIAGNOSIS — I509 Heart failure, unspecified: Secondary | ICD-10-CM | POA: Diagnosis not present

## 2017-05-15 DIAGNOSIS — M109 Gout, unspecified: Secondary | ICD-10-CM | POA: Insufficient documentation

## 2017-05-15 DIAGNOSIS — Z87891 Personal history of nicotine dependence: Secondary | ICD-10-CM | POA: Insufficient documentation

## 2017-05-15 DIAGNOSIS — I4891 Unspecified atrial fibrillation: Secondary | ICD-10-CM | POA: Diagnosis not present

## 2017-05-15 DIAGNOSIS — Z6835 Body mass index (BMI) 35.0-35.9, adult: Secondary | ICD-10-CM | POA: Diagnosis not present

## 2017-05-15 DIAGNOSIS — M1990 Unspecified osteoarthritis, unspecified site: Secondary | ICD-10-CM | POA: Diagnosis not present

## 2017-05-15 DIAGNOSIS — I481 Persistent atrial fibrillation: Secondary | ICD-10-CM | POA: Insufficient documentation

## 2017-05-15 DIAGNOSIS — Z950 Presence of cardiac pacemaker: Secondary | ICD-10-CM | POA: Diagnosis not present

## 2017-05-15 HISTORY — PX: CARDIOVERSION: SHX1299

## 2017-05-15 SURGERY — CARDIOVERSION
Anesthesia: General

## 2017-05-15 MED ORDER — PROPOFOL 10 MG/ML IV BOLUS
INTRAVENOUS | Status: DC | PRN
  Administered 2017-05-15: 20 mg via INTRAVENOUS
  Administered 2017-05-15: 80 mg via INTRAVENOUS

## 2017-05-15 MED ORDER — SODIUM CHLORIDE 0.9 % IV SOLN
INTRAVENOUS | Status: DC | PRN
  Administered 2017-05-15: 11:00:00 via INTRAVENOUS

## 2017-05-15 MED ORDER — LIDOCAINE HCL (CARDIAC) 20 MG/ML IV SOLN
INTRAVENOUS | Status: DC | PRN
  Administered 2017-05-15: 100 mg via INTRAVENOUS

## 2017-05-15 NOTE — H&P (View-Only) (Signed)
Primary Care Physician: Albina Billet, MD Referring Physician: [facility] Triage Cardiologist: Dr. Loletha Grayer EP: Dr. Dierdre Harness is a 71 y.o. male with a h/o PPM, afib that had an ablation 04/3127 with Dr.Camnitz. He called [address] with   chills right after ablation that night and fever of 101 degrees the following morning.Marland Kitchen He took tylenol. He is bing seen in the afib clinic to address fever. He states his throat is a little scratchy but no particular cough.Urine has been darker than usual for the last few days but no odor or burning. He has felt more bloated in his abdominal area, mildly short of breath and weight is up 6 lbs. He does not appear to be in any acute distress. Afebrile in the office. Has been burping since procedure, no dysphagia. No c/o groin issues.   Fever did resolve by the next day and urine/blood cultures negative. Increased lasix helped for him to lose fluid weight and he felt better with less shortness of breath. He had f/u with Dr. Loletha Grayer, 8/22, and interrogation of his device showed atrial flutter. He was burst paced from atrial flutter to atrial fib with better rate control. Today he remains in afib, confirmed by [facility] rep. Discussed with Dr. Shonna Chock and he will be set up for cardioversion. He has also had some recent gout issues. He has been on a low carb diet and has lost 20 lbs.  Today, he denies symptoms of palpitations, chest pain, orthopnea, PND, lower extremity edema, dizziness, presyncope, syncope, or neurologic sequela. Positive for fever, mild shortness of breath, weight gain.The patient is tolerating medications without difficulties and is otherwise without complaint today.   Past Medical History:  Diagnosis Date  . Atrioventricular block   . Degenerative joint disease   . Paroxysmal atrial fibrillation (HCC)   . 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 Sinus node dysfunction [facility])    Past Surgical History:  Procedure  Laterality Date  . ATRIAL FIBRILLATION ABLATION N/A 04/03/2017   Procedure: Atrial Fibrillation Ablation;  Surgeon: Constance Haw, MD;  Location: [place] CV [facility];  Service: Cardiovascular;  Laterality: N/A;  . CARDIAC CATHETERIZATION  10/13/1994   No CAD  . CARDIAC CATHETERIZATION  05/10/2003   No CAD  . CARDIOVERSION N/A 10/21/2016   Procedure: CARDIOVERSION;  Surgeon: Sanda Klein, MD;  Location: [place];  Service: Cardiovascular;  Laterality: N/A;  . NM [place]  05/07/2011   Lexiscan: No ishcemia  . PERMANENT PACEMAKER INSERTION  03/12/2010   guidant  . ROTATOR CUFF REPAIR  1996  . US ECHOCARDIOGRAPHY  05/07/2011   mod. LVH,LA mod. dilated,borderline aortic root dilatation    Current Outpatient Prescriptions  Medication Sig Dispense Refill  . anastrozole (ARIMIDEX) 1 MG tablet Take 1 tablet by mouth daily.    . Ascorbic Acid (VITAMIN C) 1000 MG tablet Take 1,000 mg by mouth daily.    . B Complex-C-Folic Acid (STRESS FORMULA) TABS Take by mouth daily.    . Calcium Citrate (CITRACAL PO) Take by mouth daily.    . Cholecalciferol (VITAMIN D-3) 1000 UNITS CAPS Take 1 capsule by mouth daily.    . citalopram (CELEXA) 40 MG tablet Take 1 tablet (40 mg total) by mouth daily. MUST RECEIVE FUTURE REFILLS FROM PCP. 30 tablet 0  . digoxin (LANOXIN) 0.125 MG tablet Take 1 tablet (0.125 mg total) by mouth daily. 90 tablet 3  . ELIQUIS 5 MG  TABS tablet Take 1 tablet (5 mg total) by mouth 2 (two) times daily. 60 tablet 9  . furosemide (LASIX) 40 MG tablet Take 1 tablet (40 mg total) by mouth daily. 30 tablet 9  . indomethacin (INDOCIN) 50 MG capsule Take 50 mg by mouth as needed (Gout).    . metoprolol succinate (TOPROL-XL) 50 MG 24 hr tablet Take 1 tablet (50 mg total) by mouth daily. 30 tablet 11  . montelukast (SINGULAIR) 10 MG tablet Take 10 mg by mouth at bedtime.    . Omega-3 Fatty Acids (FISH OIL) 1200 MG CAPS Take 1,200 mg by mouth 2 (two) times daily.     .  pantoprazole (PROTONIX) 40 MG tablet Take 1 tablet (40 mg total) by mouth daily. 30 tablet 0  . polyethylene glycol (MIRALAX / GLYCOLAX) packet Take 17 g by mouth daily.    . tamsulosin (FLOMAX) 0.4 MG CAPS capsule Take 1 capsule (0.4 mg total) by mouth daily. 90 capsule 3  . testosterone (ANDROGEL) 50 MG/5GM GEL Place 5 g onto the skin daily.    . vitamin B-12 (CYANOCOBALAMIN) 1000 MCG tablet Take 1,000 mcg by mouth daily.    . celecoxib (CELEBREX) 200 MG capsule Take 1 capsule (200 mg total) by mouth daily. (Patient not taking: Reported on 05/05/2017) 30 capsule 6  . diltiazem (CARDIZEM CD) 240 MG 24 hr capsule Take 1 capsule (240 mg total) by mouth daily. 30 capsule 10  . potassium chloride SA (K-DUR,KLOR-CON) 20 MEQ tablet Take 1 tablet (20 mEq total) by mouth daily. (Patient not taking: Reported on 05/05/2017) 7 tablet 0   No current facility-administered medications for this encounter.     No Known Allergies  Social History   Social History  . Marital status: Married    Spouse name: N/A  . Number of children: N/A  . Years of education: N/A   Occupational History  . Not on file.   Social History Main Topics  . Smoking status: Former Smoker    Types: Cigars  . Smokeless tobacco: Never Used  . Alcohol use Yes     Comment: social  . Drug use: No  . Sexual activity: Not on file   Other Topics Concern  . Not on file   Social History Narrative  . No narrative on file    Family History  Problem Relation Age of Onset  . Stroke Mother   . Heart failure Mother     ROS- All systems are reviewed and negative except as per the HPI above  Physical Exam: Vitals:   05/05/17 0938  BP: 112/68  Pulse: 95  Weight: 263 lb (119.3 kg)  Height: 5\' 11"  (1.803 m)   Wt Readings from Last 3 Encounters:  05/05/17 263 lb (119.3 kg)  04/22/17 267 lb (121.1 kg)  04/07/17 285 lb 6.4 oz (129.5 kg)    Labs: Lab Results  Component Value Date   NA 137 05/05/2017   K 4.2 05/05/2017    CL 101 05/05/2017   CO2 28 05/05/2017   GLUCOSE 112 (H) 05/05/2017   BUN 14 05/05/2017   CREATININE 0.98 05/05/2017   CALCIUM 9.6 05/05/2017   MG 2.0 04/22/2017   Lab Results  Component Value Date   INR 1.1 10/14/2016   No results found for: CHOL, HDL, LDLCALC, TRIG   GEN- The patient is well appearing, alert and oriented x 3 today.   Head- normocephalic, atraumatic Eyes-  Sclera clear, conjunctiva pink Ears- hearing intact Oropharynx- clear Neck-  supple, no JVP Lymph- no cervical lymphadenopathy Lungs- Clear to ausculation bilaterally, normal work of breathing Heart- irregular rate and rhythm, no murmurs, rubs or gallops, PMI not laterally displaced GI- soft, NT, ND, + BS Extremities- no clubbing, cyanosis, or edema MS- no significant deformity or atrophy Skin- no rash or lesion Psych- euthymic mood, full affect Neuro- strength and sensation are intact  EKG-afib at 95 bpm, qrs int 110 ms, qtc 432 ms with v paced complexes Epic records reviewed    Assessment and Plan: 1. Afib S/p ablation Initially in SR after ablation, but has returned to persistent afib, confirmed with interrogation of device Discussed with Dr. Curt Bears and will plan for cardioversion No missed doses of eliquis  2. Fever 48-36 hours after ablation Resolved  3. Edema Resolved  with extra lasix for 3 days after ablation  4. 20 lb intentional weight loss Congratulated on his weigh loss results  5. Recent gout Having to take indocin short term for management  Butch Penny C. Toshiko Kemler, [facility] [address] [place], [place] [address] [phone]

## 2017-05-15 NOTE — Transfer of Care (Signed)
Immediate Anesthesia Transfer of Care Note  Patient: Shane [name]  Procedure(s) Performed: Procedure(s): CARDIOVERSION (N/A)  Patient Location: Endoscopy Unit  Anesthesia Type:MAC  Level of Consciousness: awake, alert  and oriented  Airway & Oxygen Therapy: Patient Spontanous Breathing and Patient connected to nasal cannula oxygen  Post-op Assessment: Report given to RN and Post -op Vital signs reviewed and stable  Post vital signs: Reviewed and stable  Last Vitals:  Vitals:   05/15/17 0928  BP: 127/85  Pulse: 78  Resp: 17  Temp: 36.9 C  SpO2: 97%    Last Pain:  Vitals:   05/15/17 0928  TempSrc: Oral         Complications: No apparent anesthesia complications

## 2017-05-15 NOTE — Interval H&P Note (Signed)
History and Physical Interval Note:  05/15/2017 10:09 AM  Shane [name]  has presented today for surgery, with the diagnosis of AFIB  The various methods of treatment have been discussed with the patient and family. After consideration of risks, benefits and other options for treatment, the patient has consented to  Procedure(s): CARDIOVERSION (N/A) as a surgical intervention .  The patient's history has been reviewed, patient examined, no change in status, stable for surgery.  I have reviewed the patient's chart and labs.  Questions were answered to the patient's satisfaction.     [name]

## 2017-05-15 NOTE — Anesthesia Preprocedure Evaluation (Signed)
Anesthesia Evaluation  Patient identified by MRN, date of birth, ID band Patient awake    Reviewed: Allergy & Precautions, NPO status , Patient's Chart, lab work & pertinent test results  History of Anesthesia Complications Negative for: history of anesthetic complications  Airway Mallampati: II  TM Distance: >3 FB Neck ROM: Full    Dental  (+) Dental Advisory Given   Pulmonary sleep apnea , former smoker,    breath sounds clear to auscultation       Cardiovascular +CHF  + dysrhythmias Atrial Fibrillation + pacemaker  Rhythm:Irregular     Neuro/Psych    GI/Hepatic   Endo/Other  Morbid obesity  Renal/GU      Musculoskeletal  (+) Arthritis ,   Abdominal   Peds  Hematology   Anesthesia Other Findings   Reproductive/Obstetrics                             Anesthesia Physical  Anesthesia Plan  ASA: III  Anesthesia Plan: General   Post-op Pain Management:    Induction: Intravenous  PONV Risk Score and Plan: 2 and Treatment may vary due to age or medical condition  Airway Management Planned: Mask  Additional Equipment:   Intra-op Plan:   Post-operative Plan:   Informed Consent: I have reviewed the patients History and Physical, chart, labs and discussed the procedure including the risks, benefits and alternatives for the proposed anesthesia with the patient or authorized representative who has indicated his/her understanding and acceptance.   Dental advisory given  Plan Discussed with: CRNA  Anesthesia Plan Comments:         Anesthesia Quick Evaluation

## 2017-05-15 NOTE — Discharge Instructions (Signed)
Electrical Cardioversion, Care After °This sheet gives you information about how to care for yourself after your procedure. Your health care provider may also give you more specific instructions. If you have problems or questions, contact your health care provider. °What can I expect after the procedure? °After the procedure, it is common to have: °· Some redness on the skin where the shocks were given. ° °Follow these instructions at home: °· Do not drive for 24 hours if you were given a medicine to help you relax (sedative). °· Take over-the-counter and prescription medicines only as told by your health care provider. °· Ask your health care provider how to check your pulse. Check it often. °· Rest for 48 hours after the procedure or as told by your health care provider. °· Avoid or limit your caffeine use as told by your health care provider. °Contact a health care provider if: °· You feel like your heart is beating too quickly or your pulse is not regular. °· You have a serious muscle cramp that does not go away. °Get help right away if: °· You have discomfort in your chest. °· You are dizzy or you feel faint. °· You have trouble breathing or you are short of breath. °· Your speech is slurred. °· You have trouble moving an arm or leg on one side of your body. °· Your fingers or toes turn cold or blue. °This information is not intended to replace advice given to you by your health care provider. Make sure you discuss any questions you have with your health care provider. °Document Released: 06/08/2013 Document Revised: 03/21/2016 Document Reviewed: 02/22/2016 °Elsevier Interactive Patient Education © 2018 [employer] ° °

## 2017-05-15 NOTE — Anesthesia Procedure Notes (Signed)
Procedure Name: MAC Date/Time: 05/15/2017 11:13 AM Performed by: Teressa Lower Pre-anesthesia Checklist: Patient identified, Emergency Drugs available, Suction available, Patient being monitored and Timeout performed Patient Re-evaluated:Patient Re-evaluated prior to induction Oxygen Delivery Method: Nasal cannula

## 2017-05-15 NOTE — Op Note (Signed)
Patient anesthetized with propofol by anesthesia WIth pads in AP position patient cardioverted to SR with 200 J synchronized biphasic energy Procedure without complication Pacer interrogated

## 2017-05-16 NOTE — Anesthesia Postprocedure Evaluation (Signed)
Anesthesia Post Note  Patient: Shane [name]  Procedure(s) Performed: Procedure(s) (LRB): CARDIOVERSION (N/A)     Patient location during evaluation: PACU Anesthesia Type: General Level of consciousness: sedated and patient cooperative Pain management: pain level controlled Vital Signs Assessment: post-procedure vital signs reviewed and stable Respiratory status: spontaneous breathing Cardiovascular status: stable Anesthetic complications: no    Last Vitals:  Vitals:   05/15/17 1125 05/15/17 1135  BP: 108/63 109/67  Pulse: (!) 59 61  Resp: 13 15  Temp:    SpO2: 98% 99%    Last Pain:  Vitals:   05/15/17 0928  TempSrc: Oral                 Nolon Nations

## 2017-05-17 ENCOUNTER — Encounter (HOSPITAL_COMMUNITY): Payer: Self-pay | Admitting: Internal Medicine

## 2017-05-22 ENCOUNTER — Encounter (HOSPITAL_COMMUNITY): Payer: Self-pay | Admitting: Nurse Practitioner

## 2017-05-22 ENCOUNTER — Ambulatory Visit (HOSPITAL_COMMUNITY)
Admission: RE | Admit: 2017-05-22 | Discharge: 2017-05-22 | Disposition: A | Discharge status: Home / Self Care | Payer: BLUE CROSS/BLUE SHIELD | Source: Ambulatory Visit | Attending: Nurse Practitioner | Admitting: Nurse Practitioner

## 2017-05-22 VITALS — BP 108/64 | HR 60 | Ht 71.0 in | Wt 258.6 lb

## 2017-05-22 DIAGNOSIS — Z9889 Other specified postprocedural states: Secondary | ICD-10-CM | POA: Diagnosis not present

## 2017-05-22 DIAGNOSIS — M109 Gout, unspecified: Secondary | ICD-10-CM | POA: Diagnosis not present

## 2017-05-22 DIAGNOSIS — Z87891 Personal history of nicotine dependence: Secondary | ICD-10-CM | POA: Insufficient documentation

## 2017-05-22 DIAGNOSIS — I481 Persistent atrial fibrillation: Secondary | ICD-10-CM

## 2017-05-22 DIAGNOSIS — Z7901 Long term (current) use of anticoagulants: Secondary | ICD-10-CM | POA: Diagnosis not present

## 2017-05-22 DIAGNOSIS — Z8249 Family history of ischemic heart disease and other diseases of the circulatory system: Secondary | ICD-10-CM | POA: Insufficient documentation

## 2017-05-22 DIAGNOSIS — Z823 Family history of stroke: Secondary | ICD-10-CM | POA: Insufficient documentation

## 2017-05-22 DIAGNOSIS — Z79899 Other long term (current) drug therapy: Secondary | ICD-10-CM | POA: Diagnosis not present

## 2017-05-22 DIAGNOSIS — I480 Paroxysmal atrial fibrillation: Secondary | ICD-10-CM | POA: Diagnosis present

## 2017-05-22 DIAGNOSIS — Z950 Presence of cardiac pacemaker: Secondary | ICD-10-CM | POA: Diagnosis not present

## 2017-05-22 DIAGNOSIS — I4819 Other persistent atrial fibrillation: Secondary | ICD-10-CM

## 2017-05-22 NOTE — Progress Notes (Signed)
Checked in with pt this am. No further chills. Temp this am is 98. His weight is down around 10 lbs with double lasix last two days. He will take again then am and go back to usual dose. He is feeling better. Urine culture clean. Blood culture clean. Wbc normal. He will not need to f/u with Camnitz today. F/u here as scheduled.   Primary Care Physician: Albina Billet, MD Referring Physician: [facility] Triage Cardiologist: Dr. Loletha Grayer EP: Dr. Dierdre Harness is a 71 y.o. male with a h/o PPM, afib that had an ablation 04/3127 with Dr.Camnitz. He called [address] with   chills right after ablation  and fever of 101 degrees the following morning.Marland Kitchen He took tylenol. He was   seen in the afib clinic to address fever. He states his throat was a little scratchy but no particular cough.Urine had been darker than usual for the last few days but no odor or burning. He had felt more bloated in his abdominal area, mildly short of breath and weight is up 6 lbs. He does not appear to be in any acute distress. Afebrile in the office. Had been burping since procedure, no dysphagia. No c/o groin issues.   Fever did resolve by the next day and urine/blood cultures negative. Increased lasix helped for him to lose fluid weight and he felt better with less shortness of breath. He had f/u with Dr. Loletha Grayer, 8/22, and interrogation of his device showed atrial flutter. He was burst paced from atrial flutter to atrial fib with better rate control. He remained  in afib  on f/u in afib clinc, confirmed by [facility] rep. Discussed with Dr. Shonna Chock and he was set up for cardioversion. He has also had some recent gout issues. He has been on a low carb diet and has lost 30 lbs since ablation!!.  F/u cardioversion which was successful, and thru brief interrogation today due to being in a paced rhythm, he does not show any afib/flutter since cardioversion. He is still having some gout issues and was given yesterday a brief course of  prednisone.  Today, he denies symptoms of palpitations, chest pain, orthopnea, PND, lower extremity edema, dizziness, presyncope, syncope, or neurologic sequela. Positive for fever, mild shortness of breath, weight gain.The patient is tolerating medications without difficulties and is otherwise without complaint today.   Past Medical History:  Diagnosis Date  . Atrioventricular block   . Degenerative joint disease   . Paroxysmal atrial fibrillation (HCC)   . 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 Sinus node dysfunction [facility])    Past Surgical History:  Procedure Laterality Date  . ATRIAL FIBRILLATION ABLATION N/A 04/03/2017   Procedure: Atrial Fibrillation Ablation;  Surgeon: Constance Haw, MD;  Location: [place] [facility];  Service: Cardiovascular;  Laterality: N/A;  . CARDIAC CATHETERIZATION  10/13/1994   No CAD  . CARDIAC CATHETERIZATION  05/10/2003   No CAD  . CARDIOVERSION N/A 10/21/2016   Procedure: CARDIOVERSION;  Surgeon: Sanda Klein, MD;  [facility] ENDOSCOPY;  Service: Cardiovascular;  Laterality: N/A;  . CARDIOVERSION N/A 05/15/2017   Procedure: CARDIOVERSION;  Surgeon: Fay Records, MD;  Location: [place];  Service: Cardiovascular;  Laterality: N/A;  . NM [place]  05/07/2011   Lexiscan: No ishcemia  . PERMANENT PACEMAKER INSERTION  03/12/2010   guidant  . ROTATOR CUFF REPAIR  1996  . US ECHOCARDIOGRAPHY  05/07/2011   mod. LVH,LA  mod. dilated,borderline aortic root dilatation    Current Outpatient Prescriptions  Medication Sig Dispense Refill  . anastrozole (ARIMIDEX) 1 MG tablet Take 1 tablet by mouth daily.    . Ascorbic Acid (VITAMIN C) 1000 MG tablet Take 1,000 mg by mouth daily.    . B Complex-C-Folic Acid (STRESS FORMULA) TABS Take by mouth daily.    . Calcium Citrate (CITRACAL PO) Take by mouth daily.    . celecoxib (CELEBREX) 200 MG capsule Take 1 capsule (200 mg total) by mouth daily. 30 capsule 6  .  Cholecalciferol (VITAMIN D-3) 1000 UNITS CAPS Take 1 capsule by mouth daily.    . citalopram (CELEXA) 40 MG tablet Take 1 tablet (40 mg total) by mouth daily. MUST RECEIVE FUTURE REFILLS FROM PCP. 30 tablet 0  . digoxin (LANOXIN) 0.125 MG tablet Take 1 tablet (0.125 mg total) by mouth daily. 90 tablet 3  . diltiazem (CARDIZEM CD) 240 MG 24 hr capsule Take 1 capsule (240 mg total) by mouth daily. 30 capsule 10  . ELIQUIS 5 MG TABS tablet Take 1 tablet (5 mg total) by mouth 2 (two) times daily. 60 tablet 9  . furosemide (LASIX) 40 MG tablet Take 1 tablet (40 mg total) by mouth daily. 30 tablet 9  . indomethacin (INDOCIN) 50 MG capsule Take 50 mg by mouth as needed (Gout).    . metoprolol succinate (TOPROL-XL) 50 MG 24 hr tablet Take 1 tablet (50 mg total) by mouth daily. 30 tablet 11  . montelukast (SINGULAIR) 10 MG tablet Take 10 mg by mouth at bedtime.    . Omega-3 Fatty Acids (FISH OIL) 1200 MG CAPS Take 1,200 mg by mouth 2 (two) times daily.     . pantoprazole (PROTONIX) 40 MG tablet Take 1 tablet (40 mg total) by mouth daily. 30 tablet 0  . polyethylene glycol (MIRALAX / GLYCOLAX) packet Take 17 g by mouth daily.    . tamsulosin (FLOMAX) 0.4 MG CAPS capsule Take 1 capsule (0.4 mg total) by mouth daily. 90 capsule 3  . testosterone (ANDROGEL) 50 MG/5GM GEL Place 5 g onto the skin daily.    . vitamin B-12 (CYANOCOBALAMIN) 1000 MCG tablet Take 1,000 mcg by mouth daily.     No current facility-administered medications for this encounter.     No Known Allergies  Social History   Social History  . Marital status: Married    Spouse name: N/A  . Number of children: N/A  . Years of education: N/A   Occupational History  . Not on file.   Social History Main Topics  . Smoking status: Former Smoker    Types: Cigars  . Smokeless tobacco: Never Used  . Alcohol use Yes     Comment: social  . Drug use: No  . Sexual activity: Not on file   Other Topics Concern  . Not on file   Social  History Narrative  . No narrative on file    Family History  Problem Relation Age of Onset  . Stroke Mother   . Heart failure Mother     ROS- All systems are reviewed and negative except as per the HPI above  Physical Exam: Vitals:   05/22/17 1059  BP: 108/64  Pulse: 60  Weight: 258 lb 9.6 oz (117.3 kg)  Height: 5\' 11"  (1.803 m)   Wt Readings from Last 3 Encounters:  05/22/17 258 lb 9.6 oz (117.3 kg)  05/15/17 255 lb 8 oz (115.9 kg)  05/05/17 263 lb (119.3 kg)  Labs: Lab Results  Component Value Date   NA 137 05/05/2017   K 4.2 05/05/2017   CL 101 05/05/2017   CO2 28 05/05/2017   GLUCOSE 112 (H) 05/05/2017   BUN 14 05/05/2017   CREATININE 0.98 05/05/2017   CALCIUM 9.6 05/05/2017   MG 2.0 04/22/2017   Lab Results  Component Value Date   INR 1.1 10/14/2016   No results found for: CHOL, HDL, LDLCALC, TRIG   GEN- The patient is well appearing, alert and oriented x 3 today.   Head- normocephalic, atraumatic Eyes-  Sclera clear, conjunctiva pink Ears- hearing intact Oropharynx- clear Neck- supple, no JVP Lymph- no cervical lymphadenopathy Lungs- Clear to ausculation bilaterally, normal work of breathing Heart- regular rate and rhythm, no murmurs, rubs or gallops, PMI not laterally displaced GI- soft, NT, ND, + BS Extremities- no clubbing, cyanosis, or edema MS- no significant deformity or atrophy Skin- no rash or lesion Psych- euthymic mood, full affect Neuro- strength and sensation are intact  EKG- AV dual-paced rhythm  Brief interrogation of device shows SR since cardioversion  Epic records reviewed   Assessment and Plan: 1. Afib S/p ablation Initially in SR after ablation, but  returned to persistent afib now in SR with successful cardioversion Continue eliquis 5 mg bid with chadsvasc score of 2  2. Fever 48-36 hours after ablation Resolved  3. Edema Resolved  with extra lasix for 3 days after ablation  4. 30 lb intentional weight  loss Congratulated on his weigh loss results!  5. Recent gout  Steroid taper to start today, advised to watch weight while on taper, increase lasix to 60 mg a day x 2-3 days if retains fluid until returns to dry weight  F/u with Dr. Curt Bears in November afib clinic as needed  Geroge Baseman. Zhane Bluitt, [facility] [address] [place], [place] [address] [phone]

## 2017-07-07 ENCOUNTER — Ambulatory Visit (INDEPENDENT_AMBULATORY_CARE_PROVIDER_SITE_OTHER): Payer: BLUE CROSS/BLUE SHIELD | Admitting: Cardiology

## 2017-07-07 ENCOUNTER — Ambulatory Visit: Payer: BLUE CROSS/BLUE SHIELD | Admitting: Cardiology

## 2017-07-07 ENCOUNTER — Encounter: Payer: Self-pay | Admitting: Cardiology

## 2017-07-07 VITALS — BP 100/66 | HR 60 | Ht 71.0 in | Wt 245.6 lb

## 2017-07-07 DIAGNOSIS — G4733 Obstructive sleep apnea (adult) (pediatric): Secondary | ICD-10-CM

## 2017-07-07 DIAGNOSIS — I5032 Chronic diastolic (congestive) heart failure: Secondary | ICD-10-CM

## 2017-07-07 DIAGNOSIS — I4819 Other persistent atrial fibrillation: Secondary | ICD-10-CM

## 2017-07-07 DIAGNOSIS — I481 Persistent atrial fibrillation: Secondary | ICD-10-CM | POA: Diagnosis not present

## 2017-07-07 DIAGNOSIS — I495 Sick sinus syndrome: Secondary | ICD-10-CM

## 2017-07-07 LAB — CUP PACEART INCLINIC DEVICE CHECK
Brady Statistic RA Percent Paced: 99 %
Implantable Lead Location: 753859
Implantable Lead Model: 4480
Implantable Lead Serial Number: 338209
Lead Channel Impedance Value: 540 Ohm
Lead Channel Impedance Value: 590 Ohm
Lead Channel Pacing Threshold Amplitude: 0.6 V
Lead Channel Pacing Threshold Amplitude: 0.9 V
Lead Channel Setting Pacing Amplitude: 2 V
MDC IDC LEAD IMPLANT DT: 20041025
MDC IDC LEAD IMPLANT DT: 20041025
MDC IDC LEAD LOCATION: 753860
MDC IDC LEAD SERIAL: 424134
MDC IDC MSMT LEADCHNL RA PACING THRESHOLD PULSEWIDTH: 0.4 ms
MDC IDC MSMT LEADCHNL RV PACING THRESHOLD PULSEWIDTH: 0.4 ms
MDC IDC PG IMPLANT DT: 20110712
MDC IDC SESS DTM: 20181106050000
MDC IDC SET LEADCHNL RV PACING AMPLITUDE: 2.4 V
MDC IDC SET LEADCHNL RV PACING PULSEWIDTH: 0.4 ms
MDC IDC SET LEADCHNL RV SENSING SENSITIVITY: 2.5 mV
MDC IDC STAT BRADY RV PERCENT PACED: 100 %
Pulse Gen Serial Number: 598266

## 2017-07-07 NOTE — Progress Notes (Signed)
Electrophysiology Office Note   Date:  07/07/2017   ID:  Arman, Loy 02-27-1946, MRN [id number]  PCP:  Albina Billet, MD  Cardiologist:  [place] Primary Electrophysiologist:  Darrell Hauk Meredith Leeds, MD    Chief Complaint  Patient presents with  . Pacemaker Check    Persistent Afib     History of Present Illness: Shane [name] is a 71 y.o. male who is being seen today for the evaluation of atrial fibrillation at the request of Albina Billet, MD. Presenting today for electrophysiology evaluation. He presents for evaluation of persistent atrial fibrillation. He has a pacemaker in place that shows 17% ventricular pacing. He has been in atrial fibrillation since January 24. Attempted cardioversion on February 20 failed after 2 shocks. Left atrium was severely dilated. 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 He had an atrial fibrillation ablation 05/15/17.  Today, denies symptoms of palpitations, chest pain, shortness of breath, orthopnea, PND, lower extremity edema, claudication, dizziness, presyncope, syncope, bleeding, or neurologic sequela. The patient is tolerating medications without difficulties. He has remained in sinus rhythm. He did require cardioversion right after his ablation but has had 12 seconds of atrial arrhythmia since that time.    Past Medical History:  Diagnosis Date  . Atrioventricular block   . Degenerative joint disease   . Paroxysmal atrial fibrillation (HCC)   . 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 Sinus node dysfunction [facility])    Past Surgical History:  Procedure Laterality Date  . CARDIAC CATHETERIZATION  10/13/1994   No CAD  . CARDIAC CATHETERIZATION  05/10/2003   No CAD  . NM MYOCAR PERF WALL MOTION  05/07/2011   Lexiscan: No ishcemia  . PERMANENT PACEMAKER INSERTION  03/12/2010   guidant  . ROTATOR CUFF REPAIR  1996  . US ECHOCARDIOGRAPHY  05/07/2011   mod. LVH,LA mod. dilated,borderline aortic  root dilatation     Current Outpatient Medications  Medication Sig Dispense Refill  . anastrozole (ARIMIDEX) 1 MG tablet Take 1 tablet by mouth daily.    . Ascorbic Acid (VITAMIN C) 1000 MG tablet Take 1,000 mg by mouth daily.    . B Complex-C-Folic Acid (STRESS FORMULA) TABS Take by mouth daily.    . Calcium Citrate (CITRACAL PO) Take by mouth daily.    . celecoxib (CELEBREX) 200 MG capsule Take 1 capsule (200 mg total) by mouth daily. 30 capsule 6  . Cholecalciferol (VITAMIN D-3) 1000 UNITS CAPS Take 1 capsule by mouth daily.    . citalopram (CELEXA) 40 MG tablet Take 1 tablet (40 mg total) by mouth daily. MUST RECEIVE FUTURE REFILLS FROM PCP. 30 tablet 0  . digoxin (LANOXIN) 0.125 MG tablet Take 1 tablet (0.125 mg total) by mouth daily. 90 tablet 3  . diltiazem (CARDIZEM CD) 240 MG 24 hr capsule Take 1 capsule (240 mg total) by mouth daily. 30 capsule 10  . ELIQUIS 5 MG TABS tablet Take 1 tablet (5 mg total) by mouth 2 (two) times daily. 60 tablet 9  . furosemide (LASIX) 40 MG tablet Take 1 tablet (40 mg total) by mouth daily. 30 tablet 9  . indomethacin (INDOCIN) 50 MG capsule Take 50 mg by mouth as needed (Gout).    . metoprolol succinate (TOPROL-XL) 50 MG 24 hr tablet Take 1 tablet (50 mg total) by mouth daily. 30 tablet 11  . montelukast (SINGULAIR) 10 MG tablet Take 10 mg by mouth at bedtime.    . Omega-3  Fatty Acids (FISH OIL) 1200 MG CAPS Take 1,200 mg by mouth 2 (two) times daily.     . pantoprazole (PROTONIX) 40 MG tablet Take 1 tablet (40 mg total) by mouth daily. 30 tablet 0  . polyethylene glycol (MIRALAX / GLYCOLAX) packet Take 17 g by mouth daily.    . tamsulosin (FLOMAX) 0.4 MG CAPS capsule Take 1 capsule (0.4 mg total) by mouth daily. 90 capsule 3  . testosterone (ANDROGEL) 50 MG/5GM GEL Place 5 g onto the skin daily.    . vitamin B-12 (CYANOCOBALAMIN) 1000 MCG tablet Take 1,000 mcg by mouth daily.     No current facility-administered medications for this visit.      Allergies:   Patient has no known allergies.   Social History:  The patient  reports that he has quit smoking. His smoking use included cigars. he has never used smokeless tobacco. He reports that he drinks alcohol. He reports that he does not use drugs.   Family History:  The patient's family history includes Heart failure in his mother; Stroke in his mother.   ROS:  Please see the history of present illness.   Otherwise, review of systems is positive for none.   All other systems are reviewed and negative.   PHYSICAL EXAM: VS:  BP 100/66   Pulse 60   Ht 5\' 11"  (1.803 m)   Wt 245 lb 9.6 oz (111.4 kg)   BMI 34.25 kg/m  , BMI Body mass index is 34.25 kg/m. GEN: Well nourished, well developed, in no acute distress  HEENT: normal  Neck: no JVD, carotid bruits, or masses Cardiac: RRR; no murmurs, rubs, or gallops,no edema  Respiratory:  clear to auscultation bilaterally, normal work of breathing GI: soft, nontender, nondistended, + BS MS: no deformity or atrophy  Skin: warm and dry, device site well healed Neuro:  Strength and sensation are intact Psych: euthymic mood, full affect  EKG:  EKG is ordered today. Personal review of the ekg ordered shows AV paced  Personal review of the device interrogation today. Results in [place]: 10/14/2016: TSH 1.63 04/22/2017: Magnesium 2.0 05/05/2017: ALT 40; BUN 14; Creatinine, Ser 0.98; Hemoglobin 15.6; Platelets 231; Potassium 4.2; Sodium 137    Lipid Panel  No results found for: CHOL, TRIG, HDL, CHOLHDL, VLDL, LDLCALC, LDLDIRECT   Wt Readings from Last 3 Encounters:  07/07/17 245 lb 9.6 oz (111.4 kg)  05/22/17 258 lb 9.6 oz (117.3 kg)  05/15/17 255 lb 8 oz (115.9 kg)      Other studies Reviewed: Additional studies/ records that were reviewed today include: TTE 11/06/16  Review of the above records today demonstrates:  - Left ventricle: Septal hypokinesis. The cavity size was   moderately dilated. Wall thickness  was increased in a pattern of   mild LVH. Systolic function was normal. The estimated ejection   fraction was in the range of 50% to 55%. - Aortic valve: There was trivial regurgitation. - Left atrium: The atrium was mildly dilated.   ASSESSMENT AND PLAN:  1.  Persistent atrial fibrillation: On Eliquis status post atrial fibrillation ablation 04/03/17.  He remains in sinus rhythm today. Kriston Mckinnie continue Eliquis and make no further changes  This patients CHA2DS2-VASc Score and unadjusted Ischemic Stroke Rate (% per year) is equal to 2.2 % stroke rate/year from a score of 2  Above score calculated as 1 point each if present [CHF, HTN, DM, Vascular=MI/PAD/Aortic Plaque, Age if 65-74, or Male] Above score calculated as  2 points each if present [Age > 75, or Stroke/TIA/TE]  2. 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  3. Chronic diastolic heart failure: No current signs of volume overload  4. OSA: Compliant with CPAP  5. Morbid obesity: Weight loss encouraged. He has lost up to 40 lbs with a change to a low carb diet.  Current medicines are reviewed at length with the patient today.   The patient does not have concerns regarding his medicines.  The following changes were made today:  none  Labs/ tests ordered today include:  Orders Placed This Encounter  Procedures  . EKG 12-Lead    Disposition:   FU with Treyana Sturgell 3 months  Signed, Stran Raper Meredith Leeds, MD  07/07/2017 11:35 AM     [facility] [address] [place] [address] [phone] (office) [phone] (fax)

## 2017-07-07 NOTE — Patient Instructions (Signed)
Medication Instructions:  Your physician recommends that you continue on your current medications as directed. Please refer to the Current Medication list given to you today.  -- If you need a refill on your cardiac medications before your next appointment, please call your pharmacy. --  Labwork: None ordered  Testing/Procedures: None ordered  Follow-Up: Your physician wants you to follow-up in: 3 months with Dr. Curt Bears.   Thank you for choosing [facility]!!   Frederik Schmidt, RN [phone]  Any Other Special Instructions Will Be Listed Below (If Applicable).

## 2017-07-08 ENCOUNTER — Other Ambulatory Visit: Payer: Self-pay | Admitting: Cardiovascular Disease

## 2017-08-07 ENCOUNTER — Other Ambulatory Visit: Payer: Self-pay | Admitting: Cardiovascular Disease

## 2017-08-07 MED ORDER — FUROSEMIDE 40 MG PO TABS: ORAL_TABLET | ORAL | 6 refills | Status: DC

## 2017-08-07 MED ORDER — APIXABAN 5 MG PO TABS: ORAL_TABLET | ORAL | 6 refills | Status: DC

## 2017-08-31 ENCOUNTER — Other Ambulatory Visit: Payer: Self-pay | Admitting: Cardiovascular Disease

## 2017-08-31 NOTE — Telephone Encounter (Signed)
REFILL 

## 2017-10-06 NOTE — Progress Notes (Signed)
Electrophysiology Office Note   Date:  10/07/2017   ID:  Aiven, Kampe Aug 16, 1946, MRN [id number]  PCP:  Albina Billet, MD  Cardiologist:  Croitrou Primary Electrophysiologist:  Trenton Verne Meredith Leeds, MD    Chief Complaint  Patient presents with  . Pacemaker Check    Persistent Afib     History of Present Illness: Shane [name] is a 72 y.o. male who is being seen today for the evaluation of atrial fibrillation at the request of Albina Billet, MD. Presenting today for electrophysiology evaluation. He presents for evaluation of persistent atrial fibrillation. He has a pacemaker in place that shows 17% ventricular pacing. He has been in atrial fibrillation since January 24. Attempted cardioversion on February 20 failed after 2 shocks. Left atrium was severely dilated. 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 He had an atrial fibrillation ablation 05/15/17.  Today, denies symptoms of palpitations, chest pain, shortness of breath, orthopnea, PND, lower extremity edema, claudication, dizziness, presyncope, syncope, bleeding, or neurologic sequela. The patient is tolerating medications without difficulties.  He is currently feeling well without major complaint.  He is noted no further episodes of atrial fibrillation.  He is continuing to exercise and is working on a low-carb diet to lose weight.   Past Medical History:  Diagnosis Date  . Atrioventricular block   . Degenerative joint disease   . Paroxysmal atrial fibrillation (HCC)   . 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 Sinus node dysfunction [facility])    Past Surgical History:  Procedure Laterality Date  . ATRIAL FIBRILLATION ABLATION N/A 04/03/2017   Procedure: Atrial Fibrillation Ablation;  Surgeon: Constance Haw, MD;  Location: [facility];  Service: Cardiovascular;  Laterality: N/A;  . CARDIAC CATHETERIZATION  10/13/1994   No CAD  . CARDIAC CATHETERIZATION   05/10/2003   No CAD  . CARDIOVERSION N/A 10/21/2016   Procedure: CARDIOVERSION;  Surgeon: Sanda Klein, MD;  Location: [facility] ENDOSCOPY;  Service: Cardiovascular;  Laterality: N/A;  . CARDIOVERSION N/A 05/15/2017   Procedure: CARDIOVERSION;  Surgeon: Fay Records, MD;  Location: [place];  Service: Cardiovascular;  Laterality: N/A;  . NM [place]  05/07/2011   Lexiscan: No ishcemia  . PERMANENT PACEMAKER INSERTION  03/12/2010   guidant  . ROTATOR CUFF REPAIR  1996  . US ECHOCARDIOGRAPHY  05/07/2011   mod. LVH,LA mod. dilated,borderline aortic root dilatation     Current Outpatient Medications  Medication Sig Dispense Refill  . anastrozole (ARIMIDEX) 1 MG tablet Take 1 tablet by mouth daily.    Marland Kitchen apixaban (ELIQUIS) 5 MG TABS tablet Take 1 tablet (5 mg total) by mouth 2 (two) times daily. 60 tablet 6  . Ascorbic Acid (VITAMIN C) 1000 MG tablet Take 1,000 mg by mouth daily.    . B Complex-C-Folic Acid (STRESS FORMULA) TABS Take by mouth daily.    . Calcium Citrate (CITRACAL PO) Take by mouth daily.    . celecoxib (CELEBREX) 200 MG capsule Take 1 capsule (200 mg total) by mouth daily. 30 capsule 6  . cephALEXin (KEFLEX) 500 MG capsule Take 500 mg by mouth 4 (four) times daily.    . Cholecalciferol (VITAMIN D-3) 1000 UNITS CAPS Take 1 capsule by mouth daily.    . citalopram (CELEXA) 40 MG tablet Take 1 tablet (40 mg total) by mouth daily. MUST RECEIVE FUTURE REFILLS FROM PCP. 30 tablet 0  . digoxin (DIGOX) 0.125 MG tablet Take 1 tablet (  0.125 mg total) by mouth daily. 90 tablet 2  . diltiazem (CARDIZEM CD) 240 MG 24 hr capsule Take 1 capsule (240 mg total) by mouth daily. 30 capsule 10  . furosemide (LASIX) 40 MG tablet Take 1 tablet (40 mg total) by mouth daily. 30 tablet 6  . indomethacin (INDOCIN) 50 MG capsule Take 50 mg by mouth as needed (Gout).    . metoprolol succinate (TOPROL-XL) 50 MG 24 hr tablet Take 1 tablet (50 mg total) by mouth daily. 30 tablet 3  . montelukast  (SINGULAIR) 10 MG tablet Take 10 mg by mouth at bedtime.    . Omega-3 Fatty Acids (FISH OIL) 1200 MG CAPS Take 1,200 mg by mouth 2 (two) times daily.     . pantoprazole (PROTONIX) 40 MG tablet Take 1 tablet (40 mg total) by mouth daily. 30 tablet 0  . polyethylene glycol (MIRALAX / GLYCOLAX) packet Take 17 g by mouth daily.    . tamsulosin (FLOMAX) 0.4 MG CAPS capsule Take 1 capsule (0.4 mg total) by mouth daily. 90 capsule 3  . testosterone (ANDROGEL) 50 MG/5GM GEL Place 5 g onto the skin daily.    . vitamin B-12 (CYANOCOBALAMIN) 1000 MCG tablet Take 1,000 mcg by mouth daily.     No current facility-administered medications for this visit.     Allergies:   Patient has no known allergies.   Social History:  The patient  reports that he has quit smoking. His smoking use included cigars. he has never used smokeless tobacco. He reports that he drinks alcohol. He reports that he does not use drugs.   Family History:  The patient's family history includes Heart failure in his mother; Stroke in his mother.   ROS:  Please see the history of present illness.   Otherwise, review of systems is positive for none.   All other systems are reviewed and negative.   PHYSICAL EXAM: VS:  BP 110/66   Pulse 60   Ht 5\' 11"  (1.803 m)   Wt 246 lb (111.6 kg)   BMI 34.31 kg/m  , BMI Body mass index is 34.31 kg/m. GEN: Well nourished, well developed, in no acute distress  HEENT: normal  Neck: no JVD, carotid bruits, or masses Cardiac: RRR; no murmurs, rubs, or gallops,no edema  Respiratory:  clear to auscultation bilaterally, normal work of breathing GI: soft, nontender, nondistended, + BS MS: no deformity or atrophy  Skin: warm and dry, device site well healed Neuro:  Strength and sensation are intact Psych: euthymic mood, full affect  EKG:  EKG is ordered today. Personal review of the ekg ordered shows AV paced  Personal review of the device interrogation today. Results in [place]: 10/14/2016: TSH 1.63 04/22/2017: Magnesium 2.0 05/05/2017: ALT 40; BUN 14; Creatinine, Ser 0.98; Hemoglobin 15.6; Platelets 231; Potassium 4.2; Sodium 137    Lipid Panel  No results found for: CHOL, TRIG, HDL, CHOLHDL, VLDL, LDLCALC, LDLDIRECT   Wt Readings from Last 3 Encounters:  10/07/17 246 lb (111.6 kg)  07/07/17 245 lb 9.6 oz (111.4 kg)  05/22/17 258 lb 9.6 oz (117.3 kg)      Other studies Reviewed: Additional studies/ records that were reviewed today include: TTE 11/06/16  Review of the above records today demonstrates:  - Left ventricle: Septal hypokinesis. The cavity size was   moderately dilated. Wall thickness was increased in a pattern of   mild LVH. Systolic function was normal. The estimated ejection   fraction was in  the range of 50% to 55%. - Aortic valve: There was trivial regurgitation. - Left atrium: The atrium was mildly dilated.   ASSESSMENT AND PLAN:  1.  Persistent atrial fibrillation: Eliquis.  Status post atrial fibrillation ablation 04/03/17.  Remains in sinus rhythm.  No changes at this time.  This patients CHA2DS2-VASc Score and unadjusted Ischemic Stroke Rate (% per year) is equal to 2.2 % stroke rate/year from a score of 2  Above score calculated as 1 point each if present [CHF, HTN, DM, Vascular=MI/PAD/Aortic Plaque, Age if 65-74, or Male] Above score calculated as 2 points each if present [Age > 75, or Stroke/TIA/TE]   2. Tachy/brady syndrome: Currently AV pacing.  Has 2 years left on his battery.  No device changes  3. Chronic diastolic heart failure: No signs of volume overload  4. OSA: Compliant with CPAP  5. Morbid obesity: Is currently on a low-carb diet.  Gained a little weight back around the holidays but is working to exercise for weight control.  Current medicines are reviewed at length with the patient today.   The patient does not have concerns regarding his medicines.  The following changes were made today:  none  Labs/  tests ordered today include:  Orders Placed This Encounter  Procedures  . EKG 12-Lead    Disposition:   FU with Shane [name] 6 months  Signed, Gwynneth Fabio Meredith Leeds, MD  10/07/2017 9:45 AM     [facility] [address] [place] [address] [phone] (office) [phone] (fax)

## 2017-10-07 ENCOUNTER — Ambulatory Visit: Payer: BLUE CROSS/BLUE SHIELD | Admitting: Cardiology

## 2017-10-07 ENCOUNTER — Other Ambulatory Visit: Payer: Self-pay

## 2017-10-07 ENCOUNTER — Encounter: Payer: Self-pay | Admitting: Cardiology

## 2017-10-07 VITALS — BP 110/66 | HR 60 | Ht 71.0 in | Wt 246.0 lb

## 2017-10-07 DIAGNOSIS — I495 Sick sinus syndrome: Secondary | ICD-10-CM | POA: Diagnosis not present

## 2017-10-07 DIAGNOSIS — I4819 Other persistent atrial fibrillation: Secondary | ICD-10-CM

## 2017-10-07 DIAGNOSIS — I481 Persistent atrial fibrillation: Secondary | ICD-10-CM

## 2017-10-07 DIAGNOSIS — I5032 Chronic diastolic (congestive) heart failure: Secondary | ICD-10-CM

## 2017-10-07 DIAGNOSIS — G4733 Obstructive sleep apnea (adult) (pediatric): Secondary | ICD-10-CM

## 2017-10-07 MED ORDER — DIGOXIN 125 MCG PO TABS: 0.1250 mg | ORAL_TABLET | Freq: Every day | ORAL | 2 refills | Status: DC

## 2017-10-07 NOTE — Telephone Encounter (Signed)
Rx(s) sent to pharmacy electronically.  

## 2017-10-07 NOTE — Patient Instructions (Addendum)
Medication Instructions:  Your physician recommends that you continue on your current medications as directed. Please refer to the Current Medication list given to you today.  If you need a refill on your cardiac medications before your next appointment, please call your pharmacy.   Labwork: None ordered  Testing/Procedures: None ordered  Follow-Up: Your physician wants you to follow-up in: 6 months with Dr. Camnitz.  You will receive a reminder letter in the mail two months in advance. If you don't receive a letter, please call our office to schedule the follow-up appointment.  Thank you for choosing [facility]!!   Shany Marinez, RN [phone]         

## 2017-10-21 ENCOUNTER — Encounter: Payer: BLUE CROSS/BLUE SHIELD | Admitting: Cardiovascular Disease

## 2017-10-21 LAB — CUP PACEART INCLINIC DEVICE CHECK
Date Time Interrogation Session: 20190220182053
Implantable Lead Implant Date: 20041025
Implantable Lead Location: 753859
Implantable Lead Location: 753860
Implantable Lead Model: 4457
Implantable Lead Serial Number: 338209
Implantable Lead Serial Number: 424134
Lead Channel Setting Pacing Amplitude: 2.4 V
Lead Channel Setting Pacing Pulse Width: 0.4 ms
Lead Channel Setting Sensing Sensitivity: 2.5 mV
MDC IDC LEAD IMPLANT DT: 20041025
MDC IDC PG IMPLANT DT: 20110712
MDC IDC PG SERIAL: 598266

## 2017-11-10 DIAGNOSIS — R972 Elevated prostate specific antigen [PSA]: Secondary | ICD-10-CM | POA: Insufficient documentation

## 2017-11-13 ENCOUNTER — Other Ambulatory Visit: Payer: Self-pay | Admitting: *Deleted

## 2017-11-13 DIAGNOSIS — N401 Enlarged prostate with lower urinary tract symptoms: Secondary | ICD-10-CM

## 2017-11-13 DIAGNOSIS — N138 Other obstructive and reflux uropathy: Secondary | ICD-10-CM | POA: Insufficient documentation

## 2017-11-13 DIAGNOSIS — E291 Testicular hypofunction: Secondary | ICD-10-CM | POA: Insufficient documentation

## 2017-11-13 MED ORDER — TAMSULOSIN HCL 0.4 MG PO CAPS: 0.4000 mg | ORAL_CAPSULE | Freq: Every day | ORAL | 11 refills | Status: DC

## 2018-01-12 ENCOUNTER — Telehealth: Payer: Self-pay | Admitting: *Deleted

## 2018-01-12 NOTE — Telephone Encounter (Signed)
Faxed CPAP supply order to  [facility]. 

## 2018-03-02 ENCOUNTER — Other Ambulatory Visit: Payer: Self-pay | Admitting: Cardiovascular Disease

## 2018-04-07 ENCOUNTER — Other Ambulatory Visit: Payer: Self-pay | Admitting: Cardiovascular Disease

## 2018-04-12 ENCOUNTER — Encounter: Payer: BLUE CROSS/BLUE SHIELD | Admitting: Cardiology

## 2018-05-04 NOTE — Progress Notes (Signed)
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 male consultation per request of Dr. Mardelle Matte.  According to patient his symptoms with gout is started about 20 years ago and his great toe.  He states initially he had 2-3 episodes per year which were treated with mostly anti-inflammatories.  He has been seeing Dr. Mardelle Matte for last 2 years.  In the last year he has been having frequent flares of gout approximately every 2 months.  He states for the last 2 months he has had a persistent flare which is been treated with prednisone and Colcrys without much relief.  Now the gout is moved to his left ankle joint which he states painful and swollen.  He has been also experiencing some cramps in the left quad muscle for which she has been using mustard and it helps.  He has long-standing history of a right rotator cuff tear which he initially had surgery for in 1995.  He has had infrequent injections in the shoulder for that.  He also gives history of right knee joint osteoarthritis for last few years for which she has been getting injections every 3 months by Dr. Mardelle Matte.  He has noticed occasional swelling in his right knee.  He also complains of some hip discomfort while walking and difficulty getting out of the chair.  None of the other joints are painful or swollen.  Activities of Daily Living:  Patient reports morning stiffness for 1 hour.   Patient Reports nocturnal pain.  Difficulty dressing/grooming: Denies Difficulty climbing stairs: Denies Difficulty getting out of chair: Reports Difficulty using hands for taps, buttons, cutlery, and/or writing: Denies  Review of Systems  Constitutional: Positive for fatigue.  Negative for night sweats.  HENT: Negative for mouth sores, mouth dryness and nose dryness.   Eyes: Negative for redness and dryness.  Respiratory: Negative for shortness of breath and difficulty breathing.   Cardiovascular: Negative for chest pain, palpitations, hypertension, irregular heartbeat and swelling in legs/feet.  Gastrointestinal: Negative for constipation and diarrhea.  Endocrine: Negative for increased urination.  Genitourinary: Negative for difficulty urinating.  Musculoskeletal: Positive for arthralgias, joint pain and morning stiffness. Negative for joint swelling, myalgias, muscle weakness, muscle tenderness and myalgias.  Skin: Negative for color change, rash, hair loss, nodules/bumps, skin tightness, ulcers and sensitivity to sunlight.  Allergic/Immunologic: Negative for susceptible to infections.  Neurological: Positive for weakness. Negative for dizziness, fainting, memory loss and night sweats.  Hematological: Negative for bruising/bleeding tendency and swollen glands.  Psychiatric/Behavioral: Negative for depressed mood and sleep disturbance. The patient is not nervous/anxious.     PMFS History:  Patient Active Problem List   Diagnosis Date Noted  . History of rotator cuff tear 05/11/2018  . Primary osteoarthritis of right knee 05/11/2018  . Atypical atrial flutter ([facility]) 04/22/2017  . Status post catheter ablation of atrial fibrillation 04/04/2017  . AF (atrial fibrillation) ([place]) 04/03/2017  . Hypotension due to drugs 09/18/2015  . OSA (obstructive sleep apnea) 03/30/2014  . Acute on chronic diastolic CHF (congestive heart failure), NYHA class 2 ([place]) 03/30/2014  .  Persistent atrial fibrillation ([place]) 04/05/2013  . Pacemaker for sinus node dysfunction 04/05/2013  . Chronic diastolic CHF (congestive heart failure) ([place]) 04/05/2013  . Obesity, morbid (more than 100 lbs over ideal weight or BMI > 40) ([place]) 04/05/2013    Past Medical History:  Diagnosis Date  .  Atrioventricular block   . Degenerative joint disease   . Paroxysmal atrial fibrillation (HCC)   . 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 Sinus node dysfunction (HCC)     Family History  Problem Relation Age of Onset  . Stroke Mother   . Heart failure Mother   . Alzheimer's disease Father   . Heart disease Sister   . Dementia Sister    Past Surgical History:  Procedure Laterality Date  . ATRIAL FIBRILLATION ABLATION N/A 04/03/2017   Procedure: Atrial Fibrillation Ablation;  Surgeon: Constance Haw, MD;  Location: [facility];  Service: Cardiovascular;  Laterality: N/A;  . CARDIAC CATHETERIZATION  10/13/1994   No CAD  . CARDIAC CATHETERIZATION  05/10/2003   No CAD  . CARDIOVERSION N/A 10/21/2016   Procedure: CARDIOVERSION;  Surgeon: Sanda Klein, MD;  Location: [facility]OS[facility];  Service: Cardiovascular;  Laterality: N/A;  . CARDIOVERSION N/A 05/15/2017   Procedure: CARDIOVERSION;  Surgeon: Fay Records, MD;  Location: [facility];  Service: Cardiovascular;  Laterality: N/A;  . NM [place]  05/07/2011   Lexiscan: No ishcemia  . PERMANENT PACEMAKER INSERTION  03/12/2010   guidant  . ROTATOR CUFF REPAIR  1996  . SHOULDER ARTHROSCOPY    . US ECHOCARDIOGRAPHY  05/07/2011   mod. LVH,LA mod. dilated,borderline aortic root dilatation   Social History   Social History Narrative  . Not on file    Objective: Vital Signs: BP (!) 92/50 (BP Location: Right Arm, Patient Position: Sitting, Cuff Size: Normal)   Pulse 60   Resp 16   Ht 5\' 11"  (1.803 m)   Wt 253 lb (114.8 kg)   BMI 35.29 kg/m    Physical Exam  Constitutional: He is oriented to person, place, and time. He appears well-developed and well-nourished.  HENT:  Head: Normocephalic and atraumatic.  Eyes: Pupils are equal, round, and reactive to light. Conjunctivae and EOM are normal.  Neck: Normal range of motion. Neck supple.  Cardiovascular: Normal rate, regular rhythm and normal  heart sounds.  Pulmonary/Chest: Effort normal and breath sounds normal.  Abdominal: Soft. Bowel sounds are normal.  Neurological: He is alert and oriented to person, place, and time.  Skin: Skin is warm and dry. Capillary refill takes less than 2 seconds.  Psychiatric: He has a normal mood and affect. His behavior is normal.  Nursing note and vitals reviewed.    Musculoskeletal Exam: C-spine, thoracic and lumbar spine good range of motion.  He had discomfort with range of motion of his right shoulder joint.  Elbow joints wrist joints were in good range of motion.  DIP and PIP thickening was noted.  Hip joints were in good range of motion.  He has some crepitus in his knee joint without any warmth swelling or effusion.  He has some swelling over his left ankle joint.  He has some thickening of bilateral first MTP joint without swelling.  CDAI Exam: CDAI Score: Not documented Patient Global Assessment: Not documented; Provider Global Assessment: Not documented Swollen: Not documented; Tender: Not documented Joint Exam   Not documented   There is currently no information documented on the homunculus. Go to  the Rheumatology activity and complete the homunculus joint exam.  Investigation: No additional findings.  Imaging: Xr Foot 2 Views Left  Result Date: 05/11/2018 First MTP, all PIP and DIP narrowing was noted.  No erosive changes were noted.  No intertarsal joint space narrowing or subtalar joint space narrowing was noted.  Impression: These findings are consistent with osteoarthritis of the foot.  Xr Foot 2 Views Right  Result Date: 05/11/2018 First MTP narrowing with cystic versus erosive changes were noted.  PIP and DIP narrowing was noted.  No intertarsal joint space narrowing was noted.  No tibiotalar or subtalar joint space narrowing was noted.  A small calcaneal spur was noted. 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   Recent Labs: Lab Results  Component Value Date   WBC 6.2 05/05/2017   HGB 15.6 05/05/2017   PLT 231 05/05/2017   NA 137 05/05/2017   K 4.2 05/05/2017   CL 101 05/05/2017   CO2 28 05/05/2017   GLUCOSE 112 (H) 05/05/2017   BUN 14 05/05/2017   CREATININE 0.98 05/05/2017   BILITOT 1.4 (H) 05/05/2017   ALKPHOS 35 (L) 05/05/2017   AST 31 05/05/2017   ALT 40 05/05/2017   PROT 7.3 05/05/2017   ALBUMIN 3.8 05/05/2017   CALCIUM 9.6 05/05/2017   GFRAA >60 05/05/2017    Speciality Comments: No specialty comments available.  Procedures:  No procedures performed Allergies: Patient has no known allergies.   Assessment / Plan:     Visit Diagnoses: Idiopathic chronic gout of multiple sites without tophus -patient gives history of gout for many years which is not crystal proven.  He states he gets gout flares mostly in his great toe and recently his pain in his ankle which is been persistent for the last few months.  He has tried Medrol dose pack and Colchicine without satisfactory results.  He came off Colcrys recently.- Plan: Uric acid Colchicine may increase digoxin levels.  Anti-inflammatories are not advisable with Eliquis.  I would like to keep him on colchicine 0.6 mg p.o. daily.  And add allopurinol 100 mg p.o. daily after 1 week.  We will increase allopurinol by 100 mg every month until he reaches 300 mg.  We can give him prednisone for flares.  Pain in both feet -he has swelling in his left ankle.  He also has tenderness across his MTPs but no synovitis was noted.  Plan: XR Foot 2 Views Right, XR Foot 2 Views Left, Rheumatoid factor, Cyclic citrul peptide antibody, IgG  History of rotator cuff tear - right.  Patient had repair in the past.  He has been having infrequent injections by Dr. Mardelle Matte as needed.  Primary osteoarthritis of right knee -he has warmth swelling and mild effusion in his right knee joint.  He gets cortisone shots every 3 months.  Persistent  atrial fibrillation (HCC) - Pacemaker for sinus node dysfunctionon Eliquis  Chronic diastolic CHF (congestive heart failure) (HCC)-he is on multiple medications per  OSA (obstructive sleep apnea)  Medication monitoring encounter - Plan: CBC with Differential/Platelet, COMPLETE METABOLIC PANEL WITH GFR   Orders: Orders Placed This Encounter  Procedures  . XR Foot 2 Views Right  . XR Foot 2 Views Left  . CBC with Differential/Platelet  . COMPLETE METABOLIC PANEL WITH GFR  . Uric acid  . Rheumatoid factor  . Cyclic citrul peptide antibody, IgG   Meds ordered this encounter  Medications  . allopurinol (ZYLOPRIM) 100  MG tablet    Sig: Take 1 tablet (100 mg total) by mouth daily.    Dispense:  30 tablet    Refill:  0    Face-to-face time spent with patient was 50 minutes. Greater than 50% of time was spent in counseling and coordination of care.  Follow-Up Instructions: Return for Osteoarthritis, Gout.   Bo Merino, MD  Note - This record has been created using Editor, commissioning.  Chart creation errors have been sought, but may not always  have been located. Such creation errors do not reflect on  the standard of medical care.

## 2018-05-05 ENCOUNTER — Other Ambulatory Visit: Payer: Self-pay | Admitting: Cardiovascular Disease

## 2018-05-05 ENCOUNTER — Other Ambulatory Visit: Payer: Self-pay | Admitting: *Deleted

## 2018-05-05 MED ORDER — DILTIAZEM HCL ER COATED BEADS 240 MG PO CP24: 240.0000 mg | ORAL_CAPSULE | Freq: Every day | ORAL | 3 refills | Status: DC

## 2018-05-05 NOTE — Telephone Encounter (Signed)
Rx request sent to pharmacy.  

## 2018-05-11 ENCOUNTER — Encounter: Payer: Self-pay | Admitting: Rheumatology

## 2018-05-11 ENCOUNTER — Ambulatory Visit (INDEPENDENT_AMBULATORY_CARE_PROVIDER_SITE_OTHER): Payer: Self-pay

## 2018-05-11 ENCOUNTER — Telehealth: Payer: Self-pay | Admitting: Cardiology

## 2018-05-11 ENCOUNTER — Ambulatory Visit: Payer: BLUE CROSS/BLUE SHIELD | Admitting: Rheumatology

## 2018-05-11 VITALS — BP 92/50 | HR 60 | Resp 16 | Ht 71.0 in | Wt 253.0 lb

## 2018-05-11 DIAGNOSIS — M79671 Pain in right foot: Secondary | ICD-10-CM

## 2018-05-11 DIAGNOSIS — M1711 Unilateral primary osteoarthritis, right knee: Secondary | ICD-10-CM | POA: Insufficient documentation

## 2018-05-11 DIAGNOSIS — M1A09X0 Idiopathic chronic gout, multiple sites, without tophus (tophi): Secondary | ICD-10-CM

## 2018-05-11 DIAGNOSIS — Z8739 Personal history of other diseases of the musculoskeletal system and connective tissue: Secondary | ICD-10-CM | POA: Diagnosis not present

## 2018-05-11 DIAGNOSIS — I481 Persistent atrial fibrillation: Secondary | ICD-10-CM

## 2018-05-11 DIAGNOSIS — Z5181 Encounter for therapeutic drug level monitoring: Secondary | ICD-10-CM

## 2018-05-11 DIAGNOSIS — I4819 Other persistent atrial fibrillation: Secondary | ICD-10-CM

## 2018-05-11 DIAGNOSIS — G4733 Obstructive sleep apnea (adult) (pediatric): Secondary | ICD-10-CM

## 2018-05-11 DIAGNOSIS — M79672 Pain in left foot: Secondary | ICD-10-CM | POA: Diagnosis not present

## 2018-05-11 DIAGNOSIS — I5032 Chronic diastolic (congestive) heart failure: Secondary | ICD-10-CM

## 2018-05-11 MED ORDER — ALLOPURINOL 100 MG PO TABS
100.0000 mg | ORAL_TABLET | Freq: Every day | ORAL | 0 refills | Status: DC
Start: 2018-05-11 — End: 2018-05-31

## 2018-05-11 MED ORDER — COLCHICINE 0.6 MG PO TABS: ORAL_TABLET | ORAL | 2 refills | Status: DC

## 2018-05-11 NOTE — Telephone Encounter (Signed)
Advised ok to begin colchicine.  Pt will monitor for diarrhea/myalgias and call us if these SE begin. Pt will have Dig level drawn next Friday at previously scheduled f/u appt w/ [facility]. Pt appreciative of the call.

## 2018-05-11 NOTE — Patient Instructions (Addendum)
Gout Gout is painful swelling that can happen in some of your joints. Gout is a type of arthritis. This condition is caused by having too much uric acid in your body. Uric acid is a chemical that is made when your body breaks down substances called purines. If your body has too much uric acid, sharp crystals can form and build up in your joints. This causes pain and swelling. Gout attacks can happen quickly and be very painful (acute gout). Over time, the attacks can affect more joints and happen more often (chronic gout). Follow these instructions at home: During a Gout Attack  If directed, put ice on the painful area: ? Put ice in a plastic bag. ? Place a towel between your skin and the bag. ? Leave the ice on for 20 minutes, 2-3 times a day.  Rest the joint as much as possible. If the joint is in your leg, you may be given crutches to use.  Raise (elevate) the painful joint above the level of your heart as often as you can.  Drink enough fluids to keep your pee (urine) clear or pale yellow.  Take over-the-counter and prescription medicines only as told by your doctor.  Do not drive or use heavy machinery while taking prescription pain medicine.  Follow instructions from your doctor about what you can or cannot eat and drink.  Return to your normal activities as told by your doctor. Ask your doctor what activities are safe for you. Avoiding Future Gout Attacks  Follow a low-purine diet as told by a specialist (dietitian) or your doctor. Avoid foods and drinks that have a lot of purines, such as: ? Liver. ? Kidney. ? Anchovies. ? Asparagus. ? Herring. ? Mushrooms ? Mussels. ? Beer.  Limit alcohol intake to no more than 1 drink a day for nonpregnant women and 2 drinks a day for men. One drink equals 12 oz of beer, 5 oz of wine, or 1 oz of hard liquor.  Stay at a healthy weight or lose weight if you are overweight. If you want to lose weight, talk with your doctor. It is  important that you do not lose weight too fast.  Start or continue an exercise plan as told by your doctor.  Drink enough fluids to keep your pee clear or pale yellow.  Take over-the-counter and prescription medicines only as told by your doctor.  Keep all follow-up visits as told by your doctor. This is important. Contact a doctor if:  You have another gout attack.  You still have symptoms of a gout attack after10 days of treatment.  You have problems (side effects) because of your medicines.  You have chills or a fever.  You have burning pain when you pee (urinate).  You have pain in your lower back or belly. Get help right away if:  You have very bad pain.  Your pain cannot be controlled.  You cannot pee. This information is not intended to replace advice given to you by your health care provider. Make sure you discuss any questions you have with your health care provider. Document Released: 05/27/2008 Document Revised: 01/24/2016 Document Reviewed: 05/31/2015 Elsevier Interactive Patient Education  2018 [employer].   Colchicine interferes with diltiazem and can also increase the levels of digoxin.  Please discuss this further with your cardiologist.  He will be taking half a tablet of colchicine daily.  Please start allopurinol 1 tablet by mouth daily after 1 week and stay on it for  1 month.  We will check your levels after 1 month and then increase allopurinol to 200 mg a day.  If you tolerate allopurinol we will increase it to 300 mg a day after another month.

## 2018-05-11 NOTE — Telephone Encounter (Signed)
Patient walked in asking for nurse to call him. Patient stated he has been to Dr. Arlean Hopping office and has questions regarding his medications. Patient  wants to be cleared to take new medication. His question is regarding...   "Colchicine interferes with diltiazem & can also increase the levels of digoxin".  Dr. Estanislado Pandy is wanting patient to discuss further questions with Dr. Curt Bears regarding this.    Routing to [facility], Therapist, sports.

## 2018-05-12 ENCOUNTER — Telehealth: Payer: Self-pay | Admitting: Rheumatology

## 2018-05-12 LAB — COMPLETE METABOLIC PANEL WITH GFR
AG RATIO: 1.7 (calc) (ref 1.0–2.5)
ALBUMIN MSPROF: 4.3 g/dL (ref 3.6–5.1)
ALKALINE PHOSPHATASE (APISO): 46 U/L (ref 40–115)
ALT: 29 U/L (ref 9–46)
AST: 25 U/L (ref 10–35)
BILIRUBIN TOTAL: 1.1 mg/dL (ref 0.2–1.2)
BUN: 15 mg/dL (ref 7–25)
CHLORIDE: 102 mmol/L (ref 98–110)
CO2: 32 mmol/L (ref 20–32)
Calcium: 9.7 mg/dL (ref 8.6–10.3)
Creat: 1.11 mg/dL (ref 0.70–1.18)
GFR, EST AFRICAN AMERICAN: 76 mL/min/{1.73_m2} (ref >=60)
GFR, Est Non African American: 66 mL/min/{1.73_m2} (ref >=60)
GLOBULIN: 2.6 g/dL (ref 1.9–3.7)
Glucose, Bld: 73 mg/dL (ref 65–99)
Potassium: 4 mmol/L (ref 3.5–5.3)
SODIUM: 141 mmol/L (ref 135–146)
Total Protein: 6.9 g/dL (ref 6.1–8.1)

## 2018-05-12 LAB — CBC WITH DIFFERENTIAL/PLATELET
Basophils Absolute: 62 cells/uL (ref 0–200)
Basophils Relative: 1 %
EOS PCT: 1.5 %
Eosinophils Absolute: 93 cells/uL (ref 15–500)
HEMATOCRIT: 44.5 % (ref 38.5–50.0)
HEMOGLOBIN: 15.4 g/dL (ref 13.2–17.1)
LYMPHS ABS: 1823 {cells}/uL (ref 850–3900)
MCH: 32.5 pg (ref 27.0–33.0)
MCHC: 34.6 g/dL (ref 32.0–36.0)
MCV: 93.9 fL (ref 80.0–100.0)
MONOS PCT: 7.3 %
MPV: 10.8 fL (ref 7.5–12.5)
NEUTROS ABS: 3770 {cells}/uL (ref 1500–7800)
Neutrophils Relative %: 60.8 %
Platelets: 261 10*3/uL (ref 140–400)
RBC: 4.74 10*6/uL (ref 4.20–5.80)
RDW: 12.6 % (ref 11.0–15.0)
Total Lymphocyte: 29.4 %
WBC mixed population: 453 cells/uL (ref 200–950)
WBC: 6.2 10*3/uL (ref 3.8–10.8)

## 2018-05-12 LAB — RHEUMATOID FACTOR: Rhuematoid fact SerPl-aCnc: <14 IU/mL (ref <14)

## 2018-05-12 LAB — CYCLIC CITRUL PEPTIDE ANTIBODY, IGG: Cyclic Citrullin Peptide Ab: <16 UNITS

## 2018-05-12 LAB — URIC ACID: Uric Acid, Serum: 9 mg/dL — ABNORMAL HIGH (ref 4.0–8.0)

## 2018-05-12 NOTE — Progress Notes (Signed)
Uric acid is elevated.  He should follow the treatment directions as discussed during the office visit.

## 2018-05-12 NOTE — Telephone Encounter (Signed)
Attempted to contact the patient and no answer. Unable to leave a message.  

## 2018-05-12 NOTE — Telephone Encounter (Signed)
Patient states the low dose of Colchicine was approved by cardiologist.

## 2018-05-12 NOTE — Telephone Encounter (Signed)
Patient left a voicemail stating he was returning your call with instructions from his cardiologist.

## 2018-05-17 ENCOUNTER — Telehealth: Payer: Self-pay | Admitting: Rheumatology

## 2018-05-17 MED ORDER — PREDNISONE 5 MG PO TABS: ORAL_TABLET | ORAL | 0 refills | Status: DC

## 2018-05-17 NOTE — Telephone Encounter (Signed)
Patient has developed Gout again in foot/ ankle/ ball of lt foot. Patient questions a rx of Prednisone. Patient uses [facility] in [place]. Patient was seen as a new patient on 05/11/18, where he was started on Colchicine half tablet daily and the Allopurinol 100 mg. Patient declined the prednisone at the time of appointment and states he would call if he needed a prescription. Patient is now in need of prescription.   Okay to send in Prednisone taper?

## 2018-05-17 NOTE — Telephone Encounter (Signed)
Patient has developed Gout again in foot/ ankle/ ball of lt foot. Patient questions a rx of Prednisone. Patient uses [facility] in [place].

## 2018-05-17 NOTE — Telephone Encounter (Signed)
Prednisone has been sent to the pharmacy, patient has been notified.

## 2018-05-17 NOTE — Telephone Encounter (Signed)
Pred 20 mg , taper by 5 mg q 2 d

## 2018-05-21 ENCOUNTER — Encounter: Payer: Self-pay | Admitting: Cardiology

## 2018-05-21 ENCOUNTER — Ambulatory Visit: Payer: BLUE CROSS/BLUE SHIELD | Admitting: Cardiology

## 2018-05-21 ENCOUNTER — Telehealth: Payer: Self-pay | Admitting: *Deleted

## 2018-05-21 VITALS — BP 120/72 | HR 61 | Ht 71.0 in | Wt 247.0 lb

## 2018-05-21 DIAGNOSIS — G4733 Obstructive sleep apnea (adult) (pediatric): Secondary | ICD-10-CM

## 2018-05-21 DIAGNOSIS — I481 Persistent atrial fibrillation: Secondary | ICD-10-CM | POA: Diagnosis not present

## 2018-05-21 DIAGNOSIS — I5032 Chronic diastolic (congestive) heart failure: Secondary | ICD-10-CM

## 2018-05-21 DIAGNOSIS — I4819 Other persistent atrial fibrillation: Secondary | ICD-10-CM

## 2018-05-21 DIAGNOSIS — I495 Sick sinus syndrome: Secondary | ICD-10-CM | POA: Diagnosis not present

## 2018-05-21 NOTE — Progress Notes (Signed)
Electrophysiology Office Note   Date:  05/21/2018   [name], Shane [name] 14-Dec-1945, MRN [id number]  PCP:  Albina Billet, MD  Cardiologist:  [place] Primary Electrophysiologist:  Thomasine Klutts Meredith Leeds, MD    No chief complaint on file.    History of Present Illness: Shane [name] is a 72 y.o. male who is being seen today for the evaluation of atrial fibrillation at the request of Albina Billet, MD. Presenting today for electrophysiology evaluation. He presents for evaluation of persistent atrial fibrillation. He has a pacemaker in place that shows 17% ventricular pacing. He has been in atrial fibrillation since January 24. Attempted cardioversion on February 20 failed after 2 shocks. Left atrium was severely dilated. 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 He had an atrial fibrillation ablation 05/15/17.  Today, denies symptoms of palpitations, chest pain, shortness of breath, orthopnea, PND, lower extremity edema, claudication, dizziness, presyncope, syncope, bleeding, or neurologic sequela. The patient is tolerating medications without difficulties.  Overall he is feeling well.  He is noted no further episodes of atrial fibrillation.  His device is functioning appropriately.   Past Medical History:  Diagnosis Date  . Atrioventricular block   . Degenerative joint disease   . Paroxysmal atrial fibrillation (HCC)   . 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 Sinus node dysfunction [facility])    Past Surgical History:  Procedure Laterality Date  . ATRIAL FIBRILLATION ABLATION N/A 04/03/2017   Procedure: Atrial Fibrillation Ablation;  Surgeon: Constance Haw, MD;  Location: [place] CV LAB;  Service: Cardiovascular;  Laterality: N/A;  . CARDIAC CATHETERIZATION  10/13/1994   No CAD  . CARDIAC CATHETERIZATION  05/10/2003   No CAD  . CARDIOVERSION N/A 10/21/2016   Procedure: CARDIOVERSION;  Surgeon: Sanda Klein, MD;  Location: [facility]  ENDOSCOPY;  Service: Cardiovascular;  Laterality: N/A;  . CARDIOVERSION N/A 05/15/2017   Procedure: CARDIOVERSION;  Surgeon: Fay Records, MD;  Location: [place];  Service: Cardiovascular;  Laterality: N/A;  . NM [place]  05/07/2011   Lexiscan: No ishcemia  . PERMANENT PACEMAKER INSERTION  03/12/2010   guidant  . ROTATOR CUFF REPAIR  1996  . SHOULDER ARTHROSCOPY    . US ECHOCARDIOGRAPHY  05/07/2011   mod. LVH,LA mod. dilated,borderline aortic root dilatation     Current Outpatient Medications  Medication Sig Dispense Refill  . allopurinol (ZYLOPRIM) 100 MG tablet Take 1 tablet (100 mg total) by mouth daily. 30 tablet 0  . anastrozole (ARIMIDEX) 1 MG tablet Take 1 tablet by mouth daily.    . Ascorbic Acid (VITAMIN C) 1000 MG tablet Take 1,000 mg by mouth daily.    . Calcium Citrate (CITRACAL PO) Take by mouth daily. Citracal Tablets as directed daily    . Cholecalciferol (VITAMIN D-3) 1000 UNITS CAPS Take 1 capsule by mouth daily.    . citalopram (CELEXA) 40 MG tablet Take 1 tablet (40 mg total) by mouth daily. MUST RECEIVE FUTURE REFILLS FROM PCP. 30 tablet 0  . co-enzyme Q-10 50 MG capsule Take by mouth.    . colchicine (COLCRYS) 0.6 MG tablet Half tablet p.o. daily 15 tablet 2  . digoxin (DIGOX) 0.125 MG tablet Take 1 tablet (0.125 mg total) by mouth daily. 90 tablet 2  . diltiazem (CARDIZEM CD) 240 MG 24 hr capsule Take 1 capsule (240 mg total) by mouth daily. 30 capsule 3  . ELIQUIS 5 MG TABS tablet Take 1 tablet (5  mg total) by mouth 2 (two) times daily. 180 tablet 1  . furosemide (LASIX) 40 MG tablet Take 1 tablet (40 mg total) by mouth daily. 30 tablet 6  . metoprolol succinate (TOPROL-XL) 50 MG 24 hr tablet Take 1 tablet (50 mg total) by mouth daily. 30 tablet 6  . montelukast (SINGULAIR) 10 MG tablet Take 10 mg by mouth at bedtime.    . Omega-3 Fatty Acids (FISH OIL) 1200 MG CAPS Take 1,200 mg by mouth 2 (two) times daily.     . predniSONE (DELTASONE) 5 MG  tablet Take 4 tablets x2 days, 3 tablets x2 days, 2 tablets x2 days, 1 tablet x2 days. 20 tablet 0  . pyridOXINE (VITAMIN B-6) 100 MG tablet Take 100 mg by mouth daily.    . tamsulosin (FLOMAX) 0.4 MG CAPS capsule Take 1 capsule (0.4 mg total) by mouth daily. 30 capsule 11  . Testosterone 1.62 % GEL 3 Pump daily.  1  . vitamin B-12 (CYANOCOBALAMIN) 1000 MCG tablet Take 1,000 mcg by mouth daily.     No current facility-administered medications for this visit.     Allergies:   Patient has no known allergies.   Social History:  The patient  reports that he quit smoking about 25 years ago. His smoking use included cigars. He quit smokeless tobacco use about 25 years ago.  His smokeless tobacco use included chew. He reports that he drinks alcohol. He reports that he does not use drugs.   Family History:  The patient's family history includes Alzheimer's disease in his father; Dementia in his sister; Heart disease in his sister; Heart failure in his mother; Stroke in his mother.   ROS:  Please see the history of present illness.   Otherwise, review of systems is positive for hearing loss, visual changes, balance problems.   All other systems are reviewed and negative.   PHYSICAL EXAM: VS:  BP 120/72   Pulse 61   Ht 5\' 11"  (1.803 m)   Wt 247 lb (112 kg)   SpO2 98%   BMI 34.45 kg/m  , BMI Body mass index is 34.45 kg/m. GEN: Well nourished, well developed, in no acute distress  HEENT: normal  Neck: no JVD, carotid bruits, or masses Cardiac: RRR; no murmurs, rubs, or gallops,no edema  Respiratory:  clear to auscultation bilaterally, normal work of breathing GI: soft, nontender, nondistended, + BS MS: no deformity or atrophy  Skin: warm and dry, device site well healed Neuro:  Strength and sensation are intact Psych: euthymic mood, full affect  EKG:  EKG is not ordered today. Personal review of the ekg ordered 10/07/17 shows AV paced  Personal review of the device interrogation today.  Results in [place]: 05/11/2018: ALT 29; BUN 15; Creat 1.11; Hemoglobin 15.4; Platelets 261; Potassium 4.0; Sodium 141    Lipid Panel  No results found for: CHOL, TRIG, HDL, CHOLHDL, VLDL, LDLCALC, LDLDIRECT   Wt Readings from Last 3 Encounters:  05/21/18 247 lb (112 kg)  05/11/18 253 lb (114.8 kg)  10/07/17 246 lb (111.6 kg)      Other studies Reviewed: Additional studies/ records that were reviewed today include: TTE 11/06/16  Review of the above records today demonstrates:  - Left ventricle: Septal hypokinesis. The cavity size was   moderately dilated. Wall thickness was increased in a pattern of   mild LVH. Systolic function was normal. The estimated ejection   fraction was in the range of 50% to 55%. -  Aortic valve: There was trivial regurgitation. - Left atrium: The atrium was mildly dilated.   ASSESSMENT AND PLAN:  1.  Persistent atrial fibrillation: On Eliquis.  Status post atrial fibrillation ablation 04/03/2017.  Remains in sinus rhythm.  No changes at this time.    This patients CHA2DS2-VASc Score and unadjusted Ischemic Stroke Rate (% per year) is equal to 2.2 % stroke rate/year from a score of 2  Above score calculated as 1 point each if present [CHF, HTN, DM, Vascular=MI/PAD/Aortic Plaque, Age if 65-74, or Male] Above score calculated as 2 points each if present [Age > 75, or Stroke/TIA/TE]   2. Tachy/brady syndrome: Currently AV pacing.  Xavia Kniskern need a generator change shortly.  No changes at this time.  3. Chronic diastolic heart failure: No signs of volume overload  4. OSA: Compliant with CPAP  5. Morbid obesity: Currently using diet and exercise to lose weight.  Current medicines are reviewed at length with the patient today.   The patient does not have concerns regarding his medicines.  The following changes were made today: None  Labs/ tests ordered today include:  No orders of the defined types were placed in this  encounter.   Disposition:   FU with Kristeena Meineke 6 months  Signed, Masa Lubin Meredith Leeds, MD  05/21/2018 11:41 AM     [facility] [address] [place] [address] [phone] (office) [phone] (fax)

## 2018-05-21 NOTE — Patient Instructions (Signed)
Medication Instructions:  Your physician has recommended you make the following change in your medication: 1. STOP Digox  * If you need a refill on your cardiac medications before your next appointment, please call your pharmacy.   Labwork: None ordered  Testing/Procedures: None ordered  Follow-Up: Your physician wants you to follow-up in: 6 months with Dr. Curt Bears.  You will receive a reminder letter in the mail two months in advance. If you don't receive a letter, please call our office to schedule the follow-up appointment.  Thank you for choosing [facility]!!   Trinidad Curet, RN [phone]

## 2018-05-21 NOTE — Telephone Encounter (Signed)
Both Dr. Curt Bears and Dr. Sallyanne Kuster agreeable to pt switching to Dr. Curt Bears for his cardiology needs. Pt is aware Dr. Curt Bears will see him going forward.

## 2018-05-21 NOTE — Addendum Note (Signed)
Addended by: Stanton Kidney on: 05/21/2018 11:48 AM   Modules accepted: Orders

## 2018-05-24 ENCOUNTER — Encounter: Payer: BLUE CROSS/BLUE SHIELD | Admitting: Cardiovascular Disease

## 2018-05-31 ENCOUNTER — Other Ambulatory Visit: Payer: Self-pay | Admitting: Rheumatology

## 2018-05-31 ENCOUNTER — Other Ambulatory Visit: Payer: Self-pay | Admitting: Cardiovascular Disease

## 2018-05-31 NOTE — Telephone Encounter (Signed)
Last Visit: 05/11/18 Next Visit: 06/29/18 Labs: 05/11/18 cbc/cmp wnl  Okay to refill per Dr. Estanislado Pandy

## 2018-06-22 NOTE — Progress Notes (Signed)
Office Visit Note  Patient: Shane [name]             Date of Birth: 06-Mar-1946           MRN: [id number]             PCP: Albina Billet, MD Referring: Albina Billet, MD Visit Date: 06/29/2018 Occupation: @GUAROCC @  Subjective:  Medication management.   History of Present Illness: Shane LAUNER is a 72 y.o. male story of gouty arthropathy and osteoarthritis.  He was a started on allopurinol 100 mg p.o. daily at the last visit along with low-dose colchicine.  He denies any gout flare.  He has been taking allopurinol 100 mg p.o. daily.  Continues to have pain in his bilateral shoulders.  He also complains of bilateral knee joint pain.  Activities of Daily Living:  Patient reports morning stiffness for 1 hour.   Patient Reports nocturnal pain.  Difficulty dressing/grooming: Denies Difficulty climbing stairs: Denies Difficulty getting out of chair: Reports Difficulty using hands for taps, buttons, cutlery, and/or writing: Denies  Review of Systems  Constitutional: Negative for fatigue and night sweats.  HENT: Negative for mouth sores, trouble swallowing, trouble swallowing, mouth dryness and nose dryness.   Eyes: Negative for pain, redness, itching and dryness.  Respiratory: Negative for shortness of breath, wheezing and difficulty breathing.   Cardiovascular: Negative for chest pain, palpitations, hypertension, irregular heartbeat and swelling in legs/feet.  Gastrointestinal: Negative for abdominal pain, constipation, diarrhea, nausea and vomiting.  Endocrine: Negative for increased urination.  Genitourinary: Negative for painful urination, pelvic pain and urgency.  Musculoskeletal: Positive for arthralgias, joint pain and morning stiffness. Negative for joint swelling, myalgias, muscle weakness, muscle tenderness and myalgias.  Skin: Negative for color change, rash, hair loss, nodules/bumps, skin tightness, ulcers and sensitivity to sunlight.  Allergic/Immunologic: Negative for  susceptible to infections.  Neurological: Negative for dizziness, fainting, light-headedness, headaches, memory loss, night sweats and weakness.  Hematological: Positive for bruising/bleeding tendency. Negative for swollen glands.  Psychiatric/Behavioral: Negative for depressed mood, confusion and sleep disturbance. The patient is not nervous/anxious.     PMFS History:  Patient Active Problem List   Diagnosis Date Noted  . Idiopathic chronic gout of multiple sites without tophus 06/29/2018  . History of rotator cuff tear 05/11/2018  . Primary osteoarthritis of right knee 05/11/2018  . Benign prostatic hyperplasia with urinary obstruction 11/13/2017  . 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 Elevated prostate specific antigen (PSA) 11/10/2017  . Atypical atrial flutter ([place]) 04/22/2017  . Status post catheter ablation of atrial fibrillation 04/04/2017  . AF (atrial fibrillation) ([place]) 04/03/2017  . Hypotension due to drugs 09/18/2015  . OSA (obstructive sleep apnea) 03/30/2014  . Acute on chronic diastolic CHF (congestive heart failure), NYHA class 2 ([place]) 03/30/2014  . Obstructive sleep apnea syndrome 03/30/2014  . Persistent atrial fibrillation 04/05/2013  . Pacemaker for sinus node dysfunction 04/05/2013  . Chronic diastolic CHF (congestive heart failure) ([place]) 04/05/2013  . Obesity, morbid (more than 100 lbs over ideal weight or BMI > 40) (HCC) 04/05/2013  . Chronic diastolic heart failure ([place]) 04/05/2013  . Morbid obesity ([place]) 04/05/2013  . Presence of cardiac pacemaker 04/05/2013  . Atrial fibrillation ([place]) 04/05/2013    Past Medical History:  Diagnosis Date  . Atrioventricular block   . Degenerative joint disease   . Paroxysmal atrial fibrillation (HCC)   . 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 Sinus node dysfunction (HCC)  Family History  Problem Relation Age of Onset  . Stroke Mother   . Heart failure Mother   . Alzheimer's disease Father   .  Heart disease Sister   . Dementia Sister    Past Surgical History:  Procedure Laterality Date  . ATRIAL FIBRILLATION ABLATION N/A 04/03/2017   Procedure: Atrial Fibrillation Ablation;  Surgeon: Constance Haw, MD;  Location: [place] CV LAB;  Service: Cardiovascular;  Laterality: N/A;  . CARDIAC CATHETERIZATION  10/13/1994   No CAD  . CARDIAC CATHETERIZATION  05/10/2003   No CAD  . CARDIOVERSION N/A 10/21/2016   Procedure: CARDIOVERSION;  Surgeon: Sanda Klein, MD;  Location: [facility] ENDOSCOPY;  Service: Cardiovascular;  Laterality: N/A;  . CARDIOVERSION N/A 05/15/2017   Procedure: CARDIOVERSION;  Surgeon: Fay Records, MD;  Location: [place];  Service: Cardiovascular;  Laterality: N/A;  . NM [place]  05/07/2011   Lexiscan: No ishcemia  . PERMANENT PACEMAKER INSERTION  03/12/2010   guidant  . ROTATOR CUFF REPAIR  1996  . SHOULDER ARTHROSCOPY    . US ECHOCARDIOGRAPHY  05/07/2011   mod. LVH,LA mod. dilated,borderline aortic root dilatation   Social History   Social History Narrative  . Not on file    Objective: Vital Signs: BP (!) 94/57 (BP Location: Right Arm, Patient Position: Sitting, Cuff Size: Large)   Pulse 60   Resp 15   Ht 5\' 11"  (1.803 m)   Wt 258 lb (117 kg)   BMI 35.98 kg/m    Physical Exam  Constitutional: He is oriented to person, place, and time. He appears well-developed and well-nourished.  HENT:  Head: Normocephalic and atraumatic.  Eyes: Pupils are equal, round, and reactive to light. Conjunctivae and EOM are normal.  Neck: Normal range of motion. Neck supple.  Cardiovascular: Normal rate, regular rhythm and normal heart sounds.  Pacemaker  Pulmonary/Chest: Effort normal and breath sounds normal.  Abdominal: Soft. Bowel sounds are normal.  Neurological: He is alert and oriented to person, place, and time.  Skin: Skin is warm and dry. Capillary refill takes less than 2 seconds.  Psychiatric: He has a normal mood and affect. His  behavior is normal.  Nursing note and vitals reviewed.    Musculoskeletal Exam: C-spine some limitation with range of motion.  Thoracic and lumbar spine good range of motion.  Shoulder joints were in good range of motion with some discomfort.  Elbow joints wrist joint MCPs PIPs DIPs been good range of motion with no synovitis.  No effusion in his knee has resolved.  No warmth swelling or effusion was noted in the knee joints.  CDAI Exam: CDAI Score: Not documented Patient Global Assessment: Not documented; Provider Global Assessment: Not documented Swollen: Not documented; Tender: Not documented Joint Exam   Not documented   There is currently no information documented on the homunculus. Go to the Rheumatology activity and complete the homunculus joint exam.  Investigation: No additional findings.  Imaging: No results found.  Recent Labs: Lab Results  Component Value Date   WBC 6.2 05/11/2018   HGB 15.4 05/11/2018   PLT 261 05/11/2018   NA 141 05/11/2018   K 4.0 05/11/2018   CL 102 05/11/2018   CO2 32 05/11/2018   GLUCOSE 73 05/11/2018   BUN 15 05/11/2018   CREATININE 1.11 05/11/2018   BILITOT 1.1 05/11/2018   ALKPHOS 35 (L) 05/05/2017   AST 25 05/11/2018   ALT 29 05/11/2018   PROT 6.9 05/11/2018   ALBUMIN 3.8  05/05/2017   CALCIUM 9.7 05/11/2018   GFRAA 76 05/11/2018   May 11, 2018 uric acid 9.0, RF negative, and a CCP negative Speciality Comments: No specialty comments available.  Procedures:  No procedures performed Allergies: Patient has no known allergies.   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  Not crystal proven.  Allopurinol was added last visit with low-dose colchicine as patient is on digoxin.  He is currently on allopurinol 100 mg p.o. daily.  I will obtain labs today and increase his allopurinol to 200 mg p.o. daily.  The goal is to achieve uric acid below  6.0.  Medication management - Plan: CBC with Differential/Platelet, COMPLETE METABOLIC PANEL WITH GFR, Uric acid, CBC with Differential/Platelet, COMPLETE METABOLIC PANEL WITH GFR, Uric acid today and then in 1 month after increasing the allopurinol dose.  Neck pain and stiffness.  I have given him a handout on neck exercises.  History of rotator cuff tear - right.  Patient gets infrequent injections by Dr. Mardelle Matte.  He has chronic pain.  Primary osteoarthritis of right knee - Effusion noted during the last visit.  The effusion has resolved now.  Other medical problems are listed as follows:  Presence of cardiac pacemaker  Persistent atrial fibrillation  OSA (obstructive sleep apnea)  Morbid obesity (HCC)  Chronic diastolic heart failure ([place])   Orders: Orders Placed This Encounter  Procedures  . CBC with Differential/Platelet  . COMPLETE METABOLIC PANEL WITH GFR  . Uric acid  . CBC with Differential/Platelet  . COMPLETE METABOLIC PANEL WITH GFR  . Uric acid   Meds ordered this encounter  Medications  . allopurinol (ZYLOPRIM) 100 MG tablet    Sig: Take 2 tablets (200 mg total) by mouth daily.    Dispense:  60 tablet    Refill:  2    Face-to-face time spent with patient was 30 minutes. Greater than 50% of time was spent in counseling and coordination of care.  Follow-Up Instructions: Return in about 3 months (around 09/29/2018) for Gout, Osteoarthritis.   Bo Merino, MD  Note - This record has been created using Editor, commissioning.  Chart creation errors have been sought, but may not always  have been located. Such creation errors do not reflect on  the standard of medical care.

## 2018-06-29 ENCOUNTER — Ambulatory Visit: Payer: BLUE CROSS/BLUE SHIELD | Admitting: Rheumatology

## 2018-06-29 ENCOUNTER — Encounter: Payer: Self-pay | Admitting: Rheumatology

## 2018-06-29 VITALS — BP 94/57 | HR 60 | Resp 15 | Ht 71.0 in | Wt 258.0 lb

## 2018-06-29 DIAGNOSIS — Z79899 Other long term (current) drug therapy: Secondary | ICD-10-CM

## 2018-06-29 DIAGNOSIS — I4819 Other persistent atrial fibrillation: Secondary | ICD-10-CM

## 2018-06-29 DIAGNOSIS — Z950 Presence of cardiac pacemaker: Secondary | ICD-10-CM

## 2018-06-29 DIAGNOSIS — Z8739 Personal history of other diseases of the musculoskeletal system and connective tissue: Secondary | ICD-10-CM | POA: Diagnosis not present

## 2018-06-29 DIAGNOSIS — M1711 Unilateral primary osteoarthritis, right knee: Secondary | ICD-10-CM | POA: Diagnosis not present

## 2018-06-29 DIAGNOSIS — I5032 Chronic diastolic (congestive) heart failure: Secondary | ICD-10-CM

## 2018-06-29 DIAGNOSIS — M542 Cervicalgia: Secondary | ICD-10-CM

## 2018-06-29 DIAGNOSIS — M1A09X0 Idiopathic chronic gout, multiple sites, without tophus (tophi): Secondary | ICD-10-CM

## 2018-06-29 DIAGNOSIS — G4733 Obstructive sleep apnea (adult) (pediatric): Secondary | ICD-10-CM

## 2018-06-29 MED ORDER — ALLOPURINOL 100 MG PO TABS: 200.0000 mg | ORAL_TABLET | Freq: Every day | ORAL | 2 refills | Status: DC

## 2018-06-29 NOTE — Patient Instructions (Signed)
Cervical Strain and Sprain Rehab Ask your health care provider which exercises are safe for you. Do exercises exactly as told by your health care provider and adjust them as directed. It is normal to feel mild stretching, pulling, tightness, or discomfort as you do these exercises, but you should stop right away if you feel sudden pain or your pain gets worse.Do not begin these exercises until told by your health care provider. Stretching and range of motion exercises These exercises warm up your muscles and joints and improve the movement and flexibility of your neck. These exercises also help to relieve pain, numbness, and tingling. Exercise A: Cervical side bend  1. Using good posture, sit on a stable chair or stand up. 2. Without moving your shoulders, slowly tilt your left / right ear to your shoulder until you feel a stretch in your neck muscles. You should be looking straight ahead. 3. Hold for __________ seconds. 4. Repeat with the other side of your neck. Repeat __________ times. Complete this exercise __________ times a day. Exercise B: Cervical rotation  1. Using good posture, sit on a stable chair or stand up. 2. Slowly turn your head to the side as if you are looking over your left / right shoulder. ? Keep your eyes level with the ground. ? Stop when you feel a stretch along the side and the back of your neck. 3. Hold for __________ seconds. 4. Repeat this by turning to your other side. Repeat __________ times. Complete this exercise __________ times a day. Exercise C: Thoracic extension and pectoral stretch 1. Roll a towel or a small blanket so it is about 4 inches (10 cm) in diameter. 2. Lie down on your back on a firm surface. 3. Put the towel lengthwise, under your spine in the middle of your back. It should not be not under your shoulder blades. The towel should line up with your spine from your middle back to your lower back. 4. Put your hands behind your head and let your  elbows fall out to your sides. 5. Hold for __________ seconds. Repeat __________ times. Complete this exercise __________ times a day. Strengthening exercises These exercises build strength and endurance in your neck. Endurance is the ability to use your muscles for a long time, even after your muscles get tired. Exercise D: Upper cervical flexion, isometric 1. Lie on your back with a thin pillow behind your head and a small rolled-up towel under your neck. 2. Gently tuck your chin toward your chest and nod your head down to look toward your feet. Do not lift your head off the pillow. 3. Hold for __________ seconds. 4. Release the tension slowly. Relax your neck muscles completely before you repeat this exercise. Repeat __________ times. Complete this exercise __________ times a day. Exercise E: Cervical extension, isometric  1. Stand about 6 inches (15 cm) away from a wall, with your back facing the wall. 2. Place a soft object, about 6-8 inches (15-20 cm) in diameter, between the back of your head and the wall. A soft object could be a small pillow, a ball, or a folded towel. 3. Gently tilt your head back and press into the soft object. Keep your jaw and forehead relaxed. 4. Hold for __________ seconds. 5. Release the tension slowly. Relax your neck muscles completely before you repeat this exercise. Repeat __________ times. Complete this exercise __________ times a day. Posture and body mechanics  Body mechanics refers to the movements and positions of   your body while you do your daily activities. Posture is part of body mechanics. Good posture and healthy body mechanics can help to relieve stress in your body's tissues and joints. Good posture means that your spine is in its natural S-curve position (your spine is neutral), your shoulders are pulled back slightly, and your head is not tipped forward. The following are general guidelines for applying improved posture and body mechanics to  your everyday activities. Standing  When standing, keep your spine neutral and keep your feet about hip-width apart. Keep a slight bend in your knees. Your ears, shoulders, and hips should line up.  When you do a task in which you stand in one place for a long time, place one foot up on a stable object that is 2-4 inches (5-10 cm) high, such as a footstool. This helps keep your spine neutral. Sitting   When sitting, keep your spine neutral and your keep feet flat on the floor. Use a footrest, if necessary, and keep your thighs parallel to the floor. Avoid rounding your shoulders, and avoid tilting your head forward.  When working at a desk or a computer, keep your desk at a height where your hands are slightly lower than your elbows. Slide your chair under your desk so you are close enough to maintain good posture.  When working at a computer, place your monitor at a height where you are looking straight ahead and you do not have to tilt your head forward or downward to look at the screen. Resting When lying down and resting, avoid positions that are most painful for you. Try to support your neck in a neutral position. You can use a contour pillow or a small rolled-up towel. Your pillow should support your neck but not push on it. This information is not intended to replace advice given to you by your health care provider. Make sure you discuss any questions you have with your health care provider. Document Released: 08/18/2005 Document Revised: 04/24/2016 Document Reviewed: 07/25/2015 Elsevier Interactive Patient Education  2018 [employer]  

## 2018-06-30 LAB — CBC WITH DIFFERENTIAL/PLATELET
Basophils Absolute: 53 cells/uL (ref 0–200)
Basophils Relative: 0.8 %
EOS PCT: 2.3 %
Eosinophils Absolute: 152 cells/uL (ref 15–500)
HEMATOCRIT: 42.7 % (ref 38.5–50.0)
HEMOGLOBIN: 14.8 g/dL (ref 13.2–17.1)
LYMPHS ABS: 2086 {cells}/uL (ref 850–3900)
MCH: 32 pg (ref 27.0–33.0)
MCHC: 34.7 g/dL (ref 32.0–36.0)
MCV: 92.4 fL (ref 80.0–100.0)
MPV: 11.1 fL (ref 7.5–12.5)
Monocytes Relative: 6.6 %
NEUTROS ABS: 3874 {cells}/uL (ref 1500–7800)
Neutrophils Relative %: 58.7 %
Platelets: 264 10*3/uL (ref 140–400)
RBC: 4.62 10*6/uL (ref 4.20–5.80)
RDW: 12.6 % (ref 11.0–15.0)
Total Lymphocyte: 31.6 %
WBC mixed population: 436 cells/uL (ref 200–950)
WBC: 6.6 10*3/uL (ref 3.8–10.8)

## 2018-06-30 LAB — COMPLETE METABOLIC PANEL WITH GFR
AG RATIO: 1.7 (calc) (ref 1.0–2.5)
ALT: 19 U/L (ref 9–46)
AST: 20 U/L (ref 10–35)
Albumin: 4.2 g/dL (ref 3.6–5.1)
Alkaline phosphatase (APISO): 45 U/L (ref 40–115)
BUN: 19 mg/dL (ref 7–25)
CALCIUM: 9.6 mg/dL (ref 8.6–10.3)
CO2: 35 mmol/L — ABNORMAL HIGH (ref 20–32)
Chloride: 100 mmol/L (ref 98–110)
Creat: 1.15 mg/dL (ref 0.70–1.18)
GFR, EST AFRICAN AMERICAN: 73 mL/min/{1.73_m2} (ref >=60)
GFR, EST NON AFRICAN AMERICAN: 63 mL/min/{1.73_m2} (ref >=60)
GLOBULIN: 2.5 g/dL (ref 1.9–3.7)
Glucose, Bld: 87 mg/dL (ref 65–99)
POTASSIUM: 4 mmol/L (ref 3.5–5.3)
Sodium: 139 mmol/L (ref 135–146)
TOTAL PROTEIN: 6.7 g/dL (ref 6.1–8.1)
Total Bilirubin: 0.7 mg/dL (ref 0.2–1.2)

## 2018-06-30 LAB — URIC ACID: Uric Acid, Serum: 7.2 mg/dL (ref 4.0–8.0)

## 2018-06-30 NOTE — Progress Notes (Signed)
Uric acid is better but not in the desirable range yet.  Patient was advised to increase his allopurinol to 200 mg p.o. daily yesterday.

## 2018-06-30 NOTE — Progress Notes (Signed)
Yes, tart cherries are useful to decrease inflammation

## 2018-08-03 ENCOUNTER — Other Ambulatory Visit: Payer: Self-pay | Admitting: Rheumatology

## 2018-08-03 DIAGNOSIS — Z79899 Other long term (current) drug therapy: Secondary | ICD-10-CM

## 2018-08-03 LAB — CBC WITH DIFFERENTIAL/PLATELET
BASOS PCT: 0.6 %
Basophils Absolute: 38 cells/uL (ref 0–200)
EOS ABS: 101 {cells}/uL (ref 15–500)
Eosinophils Relative: 1.6 %
HCT: 42.8 % (ref 38.5–50.0)
Hemoglobin: 14.7 g/dL (ref 13.2–17.1)
Lymphs Abs: 1764 cells/uL (ref 850–3900)
MCH: 32.3 pg (ref 27.0–33.0)
MCHC: 34.3 g/dL (ref 32.0–36.0)
MCV: 94.1 fL (ref 80.0–100.0)
MONOS PCT: 6.8 %
MPV: 11.3 fL (ref 7.5–12.5)
Neutro Abs: 3969 cells/uL (ref 1500–7800)
Neutrophils Relative %: 63 %
PLATELETS: 264 10*3/uL (ref 140–400)
RBC: 4.55 10*6/uL (ref 4.20–5.80)
RDW: 13 % (ref 11.0–15.0)
TOTAL LYMPHOCYTE: 28 %
WBC: 6.3 10*3/uL (ref 3.8–10.8)
WBCMIX: 428 {cells}/uL (ref 200–950)

## 2018-08-03 LAB — URIC ACID: Uric Acid, Serum: 5.7 mg/dL (ref 4.0–8.0)

## 2018-08-03 LAB — COMPLETE METABOLIC PANEL WITH GFR
AG Ratio: 1.7 (calc) (ref 1.0–2.5)
ALBUMIN MSPROF: 4.3 g/dL (ref 3.6–5.1)
ALT: 23 U/L (ref 9–46)
AST: 21 U/L (ref 10–35)
Alkaline phosphatase (APISO): 55 U/L (ref 40–115)
BUN: 21 mg/dL (ref 7–25)
CALCIUM: 9.8 mg/dL (ref 8.6–10.3)
CO2: 34 mmol/L — ABNORMAL HIGH (ref 20–32)
CREATININE: 0.86 mg/dL (ref 0.70–1.18)
Chloride: 100 mmol/L (ref 98–110)
GFR, EST NON AFRICAN AMERICAN: 85 mL/min/{1.73_m2} (ref >=60)
GFR, Est African American: 99 mL/min/{1.73_m2} (ref >=60)
GLOBULIN: 2.5 g/dL (ref 1.9–3.7)
Glucose, Bld: 74 mg/dL (ref 65–99)
POTASSIUM: 4.2 mmol/L (ref 3.5–5.3)
SODIUM: 140 mmol/L (ref 135–146)
Total Bilirubin: 0.7 mg/dL (ref 0.2–1.2)
Total Protein: 6.8 g/dL (ref 6.1–8.1)

## 2018-08-04 ENCOUNTER — Other Ambulatory Visit: Payer: Self-pay | Admitting: Rheumatology

## 2018-08-04 NOTE — Telephone Encounter (Signed)
Last Visit: 06/29/18 Next Visit: 11/15/18 Labs: 08/03/18 Uric acid is within desirable range. CBC and CMP WNL.  Okay to refill per Dr. Estanislado Pandy

## 2018-08-29 ENCOUNTER — Other Ambulatory Visit: Payer: Self-pay | Admitting: Cardiovascular Disease

## 2018-09-08 ENCOUNTER — Other Ambulatory Visit: Payer: Self-pay | Admitting: Cardiovascular Disease

## 2018-09-08 ENCOUNTER — Other Ambulatory Visit: Payer: Self-pay | Admitting: Rheumatology

## 2018-09-08 NOTE — Telephone Encounter (Signed)
Last Visit: 06/29/18 Next Visit:09/30/18 Labs: 08/03/18 Uric acid is within desirable range. CBC and CMP WNL.  Okay to refill per Dr. Estanislado Pandy

## 2018-09-09 ENCOUNTER — Other Ambulatory Visit: Payer: Self-pay | Admitting: *Deleted

## 2018-09-09 MED ORDER — MITIGARE 0.6 MG PO CAPS: 0.3000 mg | ORAL_CAPSULE | Freq: Every day | ORAL | 1 refills | Status: DC

## 2018-09-09 NOTE — Telephone Encounter (Signed)
Faxed received stating that the insurance will cover [place] only.   Last Visit: 06/29/18 Next Visit:09/30/18 Labs: 12/3/19Uric acid is within desirable range. CBC and CMP WNL.  Okay to switch per Dr. Estanislado Pandy

## 2018-09-10 ENCOUNTER — Telehealth: Payer: Self-pay | Admitting: Rheumatology

## 2018-09-10 NOTE — Telephone Encounter (Signed)
Per patient he needs refill on Mitigare 0.6mg  sent to [facility] in [place]. Patient stating meds comes in a capsule now. He cannot half capsule. Please advise patient on what to do.

## 2018-09-13 NOTE — Telephone Encounter (Signed)
Patient advised the Mitigare can not be spilt in half. Patient advised we will do a prior authorization on the Colchicine tablet due to his other health conditions preventing him from taking the larger dose.

## 2018-09-16 NOTE — Progress Notes (Signed)
Office Visit Note  Patient: Shane [name]             Date of Birth: 1945/09/10           MRN: [id number]             PCP: Albina Billet, MD Referring: Albina Billet, MD Visit Date: 09/30/2018 Occupation: @GUAROCC @  Subjective:  Medication monitoring   History of Present Illness: Shane [name] is a 73 y.o. male with history of gout and osteoarthritis.  He is currently taking allopurinol 200 mg by mouth daily and colchicine 0.6 mg half tablet by mouth daily.  He has not had any recent gout flares.  He has been trying to avoid trigger foods.  He has not had any red meat and eats pork very rarely.  He denies any joint pain or joint swelling at this time.  He states that he has morning stiffness lasting about 5 minutes but otherwise has been doing well.  He has not had any prednisone recently.  He states that he recently joined a gym and has been going about 3 times a week.  He denies any other concerns at this time.  He does not need any refills.    Activities of Daily Living:  Patient reports morning stiffness for 5 minutes.   Patient Reports nocturnal pain.  Difficulty dressing/grooming: Denies Difficulty climbing stairs: Denies Difficulty getting out of chair: Reports Difficulty using hands for taps, buttons, cutlery, and/or writing: Denies  Review of Systems  Constitutional: Negative for fatigue and night sweats.  HENT: Positive for mouth dryness. Negative for mouth sores, trouble swallowing, trouble swallowing and nose dryness.   Eyes: Negative for redness, itching and dryness.  Respiratory: Negative for cough, hemoptysis, shortness of breath, wheezing and difficulty breathing.   Cardiovascular: Negative for chest pain, palpitations, hypertension, irregular heartbeat and swelling in legs/feet.  Gastrointestinal: Negative for abdominal pain, blood in stool, constipation and diarrhea.  Endocrine: Negative for increased urination.  Genitourinary: Negative for painful urination,  pelvic pain and urgency.  Musculoskeletal: Positive for arthralgias and joint pain. Negative for joint swelling, myalgias, muscle weakness, morning stiffness, muscle tenderness and myalgias.  Skin: Positive for nodules/bumps. Negative for color change, rash, hair loss, redness, skin tightness, ulcers and sensitivity to sunlight.  Allergic/Immunologic: Negative for susceptible to infections.  Neurological: Negative for dizziness, fainting, light-headedness, headaches, memory loss, night sweats and weakness.  Hematological: Negative for bruising/bleeding tendency and swollen glands.  Psychiatric/Behavioral: Negative for depressed mood, confusion and sleep disturbance. The patient is not nervous/anxious.     PMFS History:  Patient Active Problem List   Diagnosis Date Noted  . Idiopathic chronic gout of multiple sites without tophus 06/29/2018  . History of rotator cuff tear 05/11/2018  . Primary osteoarthritis of right knee 05/11/2018  . Benign prostatic hyperplasia with urinary obstruction 11/13/2017  . 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 Elevated prostate specific antigen (PSA) 11/10/2017  . Atypical atrial flutter ([place]) 04/22/2017  . Status post catheter ablation of atrial fibrillation 04/04/2017  . AF (atrial fibrillation) ([place]) 04/03/2017  . Hypotension due to drugs 09/18/2015  . OSA (obstructive sleep apnea) 03/30/2014  . Acute on chronic diastolic CHF (congestive heart failure), NYHA class 2 ([place]) 03/30/2014  . Obstructive sleep apnea syndrome 03/30/2014  . Persistent atrial fibrillation 04/05/2013  . Pacemaker for sinus node dysfunction 04/05/2013  . Chronic diastolic CHF (congestive heart failure) ([place]) 04/05/2013  . Obesity, morbid (more than 100 lbs over ideal weight or BMI >  40) ([place]) 04/05/2013  . Chronic diastolic heart failure ([place]) 04/05/2013  . Morbid obesity ([place]) 04/05/2013  . Presence of cardiac pacemaker 04/05/2013  . Atrial fibrillation ([place]) 04/05/2013    Past  Medical History:  Diagnosis Date  . Atrioventricular block   . Degenerative joint disease   . Paroxysmal atrial fibrillation (HCC)   . 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 Sinus node dysfunction (HCC)     Family History  Problem Relation Age of Onset  . Stroke Mother   . Heart failure Mother   . Alzheimer's disease Father   . Heart disease Sister   . Dementia Sister    Past Surgical History:  Procedure Laterality Date  . ATRIAL FIBRILLATION ABLATION N/A 04/03/2017   Procedure: Atrial Fibrillation Ablation;  Surgeon: Constance Haw, MD;  Location: [facility];  Service: Cardiovascular;  Laterality: N/A;  . CARDIAC CATHETERIZATION  10/13/1994   No CAD  . CARDIAC CATHETERIZATION  05/10/2003   No CAD  . CARDIOVERSION N/A 10/21/2016   Procedure: CARDIOVERSION;  Surgeon: Sanda Klein, MD;  Location: [facility] ENDOSCOPY;  Service: Cardiovascular;  Laterality: N/A;  . CARDIOVERSION N/A 05/15/2017   Procedure: CARDIOVERSION;  Surgeon: Fay Records, MD;  Location: [facility];  Service: Cardiovascular;  Laterality: N/A;  . NM [place]  05/07/2011   Lexiscan: No ishcemia  . PERMANENT PACEMAKER INSERTION  03/12/2010   guidant  . ROTATOR CUFF REPAIR  1996  . SHOULDER ARTHROSCOPY    . US ECHOCARDIOGRAPHY  05/07/2011   mod. LVH,LA mod. dilated,borderline aortic root dilatation   Social History   Social History Narrative  . Not on file   Immunization History  Administered Date(s) Administered  . Zoster Recombinat (Shingrix) 11/04/2017     Objective: Vital Signs: BP (!) 92/50 (BP Location: Left Arm, Patient Position: Sitting, Cuff Size: Large)   Pulse 60   Resp 15   Ht 5\' 11"  (1.803 m)   Wt 263 lb (119.3 kg)   BMI 36.68 kg/m    Physical Exam Vitals signs and nursing note reviewed.  Constitutional:      Appearance: He is well-developed.  HENT:     Head: Normocephalic and atraumatic.  Eyes:     Conjunctiva/sclera: Conjunctivae normal.       Pupils: Pupils are equal, round, and reactive to light.  Neck:     Musculoskeletal: Normal range of motion and neck supple.  Cardiovascular:     Rate and Rhythm: Normal rate and regular rhythm.     Heart sounds: Normal heart sounds.     Comments: Pacemaker  Pulmonary:     Effort: Pulmonary effort is normal.     Breath sounds: Normal breath sounds.  Abdominal:     General: Bowel sounds are normal.     Palpations: Abdomen is soft.  Lymphadenopathy:     Cervical: No cervical adenopathy.  Skin:    General: Skin is warm and dry.     Capillary Refill: Capillary refill takes less than 2 seconds.  Neurological:     Mental Status: He is alert and oriented to person, place, and time.  Psychiatric:        Behavior: Behavior normal.      Musculoskeletal Exam: He has some limitation of the C-spine.  Thoracic and lumbar spine good range of motion.  No midline spinal tenderness.  No SI joint tenderness.  Shoulder joints good range of motion with mild discomfort in the right shoulder  joint.  Elbow joints, wrist joints, MCPs, PIPs, DIPs good range of motion no synovitis.  He has mild PIP and DIP synovial thickening consistent osteoarthritis of bilateral hands.  He has complete fist formation bilaterally.  Hip joints good range of motion with no discomfort.  Knee joints good range of motion with no warmth or effusion.  He has bilateral knee crepitus.  No tenderness or swelling of ankle joints.  No tenderness of MTP joints.  He has no tenderness over trochanter bursa bilaterally.  No tophi noted.  CDAI Exam: CDAI Score: Not documented Patient Global Assessment: Not documented; Provider Global Assessment: Not documented Swollen: Not documented; Tender: Not documented Joint Exam   Not documented   There is currently no information documented on the homunculus. Go to the Rheumatology activity and complete the homunculus joint exam.  Investigation: No additional findings.  Imaging: No results  found.  Recent Labs: Lab Results  Component Value Date   WBC 6.3 08/03/2018   HGB 14.7 08/03/2018   PLT 264 08/03/2018   NA 140 08/03/2018   K 4.2 08/03/2018   CL 100 08/03/2018   CO2 34 (H) 08/03/2018   GLUCOSE 74 08/03/2018   BUN 21 08/03/2018   CREATININE 0.86 08/03/2018   BILITOT 0.7 08/03/2018   ALKPHOS 35 (L) 05/05/2017   AST 21 08/03/2018   ALT 23 08/03/2018   PROT 6.8 08/03/2018   ALBUMIN 3.8 05/05/2017   CALCIUM 9.8 08/03/2018   GFRAA 99 08/03/2018    Speciality Comments: No specialty comments available.  Procedures:  No procedures performed Allergies: Patient has no known allergies.   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  Not crystal proven: He has not had any recent gout flares.  He is clinically doing well on allopurinol 200 mg by mouth daily and colchicine 0.3 mg by mouth daily.  He has been trying to avoid trigger foods.  He is eliminated red meat from his diet but occasionally will eat pork.  He has no joint pain or joint swelling at this time.  He has morning stiffness lasting about 5 minutes.  He recently joined a gym and has been working out 3 times a week.  He was advised to notify us if he develops signs or symptoms of a flare.  He will continue on allopurinol 200 mg by mouth daily and colchicine 0.3 mg by mouth daily.  He does not need any refills at this time.  Most recent uric acid was 5.7 on 08/03/2018.  He will follow-up in the office in 6 months and we will obtain lab work at that time.    History of rotator cuff tear - right.  Patient gets infrequent injections by Dr. Mardelle Matte.   Primary osteoarthritis of right knee: No warmth or effusion.  She has good range of motion.  He has right knee crepitus.  Other medical conditions are listed as follows:  Chronic diastolic heart failure (HCC)  Presence of cardiac pacemaker  Persistent atrial fibrillation  Morbid obesity  (HCC)  OSA (obstructive sleep apnea)   Orders: No orders of the defined types were placed in this encounter.  No orders of the defined types were placed in this encounter.    Follow-Up Instructions: Return in about 6 months (around 03/31/2019) for Gout, Osteoarthritis.   Ofilia Neas, PA-C I examined and evaluated the patient with Hazel Sams PA.  She has no synovitis on my examination.  Findings are consistent with osteoarthritis.  He has not had any gout flare.  The plan of care was discussed as noted above.  Bo Merino, MD Note - This record has been created using Editor, commissioning.  Chart creation errors have been sought, but may not always  have been located. Such creation errors do not reflect on  the standard of medical care.

## 2018-09-20 ENCOUNTER — Telehealth: Payer: Self-pay | Admitting: *Deleted

## 2018-09-20 NOTE — Telephone Encounter (Signed)
Can you please look into grant for Colchiceine. Insurance only covers Mitigare capsules and insurance does not allow for exceptions. Patient needs the tablets to take 0.3 mg dose.

## 2018-09-20 NOTE — Telephone Encounter (Signed)
Received a Prior Authorization request from [employer] for Colchicine tablets. Authorization has been submitted to patient's insurance via Cover My Meds. Will update once we receive a response.

## 2018-09-20 NOTE — Telephone Encounter (Signed)
Patient's income is higher than grant eligibility requirements. Will look into other options. Submitted PA to plan for medication.

## 2018-09-21 ENCOUNTER — Telehealth: Payer: Self-pay | Admitting: Pharmacist

## 2018-09-21 MED ORDER — COLCHICINE 0.6 MG PO TABS: 0.3000 mg | ORAL_TABLET | Freq: Every day | ORAL | 0 refills | Status: DC

## 2018-09-21 NOTE — Telephone Encounter (Signed)
Prescription was sent to the pharmacy on 09/08/18.

## 2018-09-21 NOTE — Telephone Encounter (Signed)
Received a fax from [place] [employer] [place] regarding a prior authorization for Colchicine tabs. Authorization has been APPROVED from 09/08/2018 to 09/09/2019.   Will send document to scan center.  Authorization # [id number]  Ran test claim- $45 for 3 months  Called patient to advise. He woul like prescription sent to [facility] on his profile.  4:27 PM Beatriz Chancellor, CPhT

## 2018-09-21 NOTE — Telephone Encounter (Signed)
Received fax from [place] requesting more information for formulary exemption for colcrys.  Sent last office visit note and drug interaction between diltiazem and colchicine.  Will update when we receive a response.  Will send document to scan center.

## 2018-09-21 NOTE — Addendum Note (Signed)
Addended by: Carole Binning on: 09/21/2018 04:32 PM   Modules accepted: Orders

## 2018-09-30 ENCOUNTER — Ambulatory Visit: Payer: BLUE CROSS/BLUE SHIELD | Admitting: Rheumatology

## 2018-09-30 ENCOUNTER — Encounter: Payer: Self-pay | Admitting: Rheumatology

## 2018-09-30 ENCOUNTER — Other Ambulatory Visit: Payer: Self-pay | Admitting: Rheumatology

## 2018-09-30 VITALS — BP 92/50 | HR 60 | Resp 15 | Ht 71.0 in | Wt 263.0 lb

## 2018-09-30 DIAGNOSIS — Z8739 Personal history of other diseases of the musculoskeletal system and connective tissue: Secondary | ICD-10-CM | POA: Diagnosis not present

## 2018-09-30 DIAGNOSIS — Z950 Presence of cardiac pacemaker: Secondary | ICD-10-CM

## 2018-09-30 DIAGNOSIS — M1711 Unilateral primary osteoarthritis, right knee: Secondary | ICD-10-CM

## 2018-09-30 DIAGNOSIS — I5032 Chronic diastolic (congestive) heart failure: Secondary | ICD-10-CM

## 2018-09-30 DIAGNOSIS — G4733 Obstructive sleep apnea (adult) (pediatric): Secondary | ICD-10-CM

## 2018-09-30 DIAGNOSIS — M1A09X0 Idiopathic chronic gout, multiple sites, without tophus (tophi): Secondary | ICD-10-CM | POA: Diagnosis not present

## 2018-09-30 DIAGNOSIS — I4819 Other persistent atrial fibrillation: Secondary | ICD-10-CM

## 2018-09-30 NOTE — Telephone Encounter (Signed)
Last Visit: 06/29/18 Next Visit:09/30/18 Labs: 12/3/19Uric acid is within desirable range. CBC and CMP WNL.  Okay to refill per Dr. Estanislado Pandy

## 2018-10-13 ENCOUNTER — Other Ambulatory Visit: Payer: Self-pay | Admitting: Rheumatology

## 2018-10-15 ENCOUNTER — Telehealth: Payer: Self-pay | Admitting: Rheumatology

## 2018-10-15 ENCOUNTER — Other Ambulatory Visit: Payer: Self-pay | Admitting: *Deleted

## 2018-10-15 MED ORDER — COLCHICINE 0.6 MG PO TABS: 0.3000 mg | ORAL_TABLET | Freq: Every day | ORAL | 0 refills | Status: DC

## 2018-10-15 NOTE — Telephone Encounter (Signed)
Prescription sent to the pharmacy.

## 2018-10-15 NOTE — Telephone Encounter (Signed)
Pharmacist from [facility] left a voicemail requesting a prescription of Colchicine 0.6 mg tablets for the patient.  Patient states his insurance has approved the medication, but the pharmacy does not have the prescription.

## 2018-10-15 NOTE — Telephone Encounter (Signed)
Last Visit: 09/30/18 Next Visit: 03/31/19 Labs: 08/3018 Uric acid is within desirable range. CBC and CMP WNL.  Okay to refill per Dr. Deveshwar  

## 2018-11-02 ENCOUNTER — Encounter: Payer: Self-pay | Admitting: Cardiology

## 2018-11-02 ENCOUNTER — Ambulatory Visit: Payer: BLUE CROSS/BLUE SHIELD | Admitting: Cardiology

## 2018-11-02 VITALS — BP 120/60 | HR 60 | Ht 71.0 in | Wt 262.0 lb

## 2018-11-02 DIAGNOSIS — I495 Sick sinus syndrome: Secondary | ICD-10-CM

## 2018-11-02 DIAGNOSIS — I4819 Other persistent atrial fibrillation: Secondary | ICD-10-CM

## 2018-11-02 MED ORDER — FUROSEMIDE 40 MG PO TABS: 40.0000 mg | ORAL_TABLET | Freq: Every day | ORAL | 6 refills | Status: DC

## 2018-11-02 MED ORDER — TAMSULOSIN HCL 0.4 MG PO CAPS: 0.4000 mg | ORAL_CAPSULE | Freq: Every day | ORAL | 6 refills | Status: DC

## 2018-11-02 MED ORDER — APIXABAN 5 MG PO TABS: 5.0000 mg | ORAL_TABLET | Freq: Two times a day (BID) | ORAL | 5 refills | Status: DC

## 2018-11-02 MED ORDER — DILTIAZEM HCL ER COATED BEADS 240 MG PO CP24: 240.0000 mg | ORAL_CAPSULE | Freq: Every day | ORAL | 5 refills | Status: DC

## 2018-11-02 NOTE — Patient Instructions (Addendum)
Medication Instructions:  Your physician has recommended you make the following change in your medication:  1. STOP Metoprolol  *If you need a refill on your cardiac medications before your next appointment, please call your pharmacy*  Labwork: None ordered  Testing/Procedures: None ordered  Follow-Up: Remote monitoring is used to monitor your Pacemaker or ICD from home. This monitoring reduces the number of office visits required to check your device to one time per year. It allows [place] to keep an eye on the functioning of your device to ensure it is working properly. You are scheduled for a device check from home on 02/01/2019. You may send your transmission at any time that day. If you have a wireless device, the transmission will be sent automatically. After your physician reviews your transmission, you will receive a postcard with your next transmission date.  Your physician wants you to follow-up in: 6 months with Dr. Curt Bears.  You will receive a reminder letter in the mail two months in advance. If you don't receive a letter, please call our office to schedule the follow-up appointment.  Thank you for choosing [facility]!!   Trinidad Curet, RN [phone]

## 2018-11-02 NOTE — Progress Notes (Signed)
Electrophysiology Office Note   Date:  11/02/2018   [name], Shane [name] 12/13/1945, MRN [id number]  PCP:  Albina Billet, MD  Cardiologist:  [place] Primary Electrophysiologist:  Jaslynne Dahan Meredith Leeds, MD    No chief complaint on file.    History of Present Illness: Shane [name] is a 73 y.o. male who is being seen today for the evaluation of atrial fibrillation at the request of Albina Billet, MD. Presenting today for electrophysiology evaluation. He presents for evaluation of persistent atrial fibrillation. He has a pacemaker in place that shows 17% ventricular pacing. He has been in atrial fibrillation since January 24. Attempted cardioversion on February 20 failed after 2 shocks. Left atrium was severely dilated. 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 He had an atrial fibrillation ablation 05/15/17.  Today, denies symptoms of palpitations, chest pain, shortness of breath, orthopnea, PND, lower extremity edema, claudication, dizziness, presyncope, syncope, bleeding, or neurologic sequela. The patient is tolerating medications without difficulties.  Overall he is feeling well.  He has no chest pain or shortness of breath.  He is able to do all of his daily activities.  He did have an episode of dizziness, with no exacerbating or alleviating factors to his knowledge.  I have asked him to keep track of his symptoms if he has another episode of dizziness.   Past Medical History:  Diagnosis Date  . Atrioventricular block   . Degenerative joint disease   . Paroxysmal atrial fibrillation (HCC)   . 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 Sinus node dysfunction [facility])    Past Surgical History:  Procedure Laterality Date  . ATRIAL FIBRILLATION ABLATION N/A 04/03/2017   Procedure: Atrial Fibrillation Ablation;  Surgeon: Constance Haw, MD;  Location: [place] [facility] LAB;  Service: Cardiovascular;  Laterality: N/A;  . CARDIAC  CATHETERIZATION  10/13/1994   No CAD  . CARDIAC CATHETERIZATION  05/10/2003   No CAD  . CARDIOVERSION N/A 10/21/2016   Procedure: CARDIOVERSION;  Surgeon: Sanda Klein, MD;  Location: [facility] ENDOSCOPY;  Service: Cardiovascular;  Laterality: N/A;  . CARDIOVERSION N/A 05/15/2017   Procedure: CARDIOVERSION;  Surgeon: Fay Records, MD;  Location: [place];  Service: Cardiovascular;  Laterality: N/A;  . NM [place]  05/07/2011   Lexiscan: No ishcemia  . PERMANENT PACEMAKER INSERTION  03/12/2010   guidant  . ROTATOR CUFF REPAIR  1996  . SHOULDER ARTHROSCOPY    . US ECHOCARDIOGRAPHY  05/07/2011   mod. LVH,LA mod. dilated,borderline aortic root dilatation     Current Outpatient Medications  Medication Sig Dispense Refill  . allopurinol (ZYLOPRIM) 100 MG tablet Take 2 tablets (200 mg total) by mouth daily. 60 tablet 0  . anastrozole (ARIMIDEX) 1 MG tablet Take 1 tablet by mouth daily.    Marland Kitchen apixaban (ELIQUIS) 5 MG TABS tablet Take 1 tablet (5 mg total) by mouth 2 (two) times daily. 60 tablet 5  . Ascorbic Acid (VITAMIN C) 1000 MG tablet Take 1,000 mg by mouth daily.    . Calcium Citrate (CITRACAL PO) Take by mouth daily. Citracal Tablets as directed daily    . Cholecalciferol (VITAMIN D-3) 1000 UNITS CAPS Take 1 capsule by mouth daily.    . citalopram (CELEXA) 40 MG tablet Take 1 tablet (40 mg total) by mouth daily. MUST RECEIVE FUTURE REFILLS FROM PCP. 30 tablet 0  . co-enzyme Q-10 50 MG capsule Take by mouth.    . colchicine  0.6 MG tablet Take 0.5 tablets (0.3 mg total) by mouth daily. 45 tablet 0  . diltiazem (CARDIZEM CD) 240 MG 24 hr capsule Take 1 capsule (240 mg total) by mouth daily. 30 capsule 5  . furosemide (LASIX) 40 MG tablet Take 1 tablet (40 mg total) by mouth daily. 30 tablet 6  . metoprolol succinate (TOPROL-XL) 50 MG 24 hr tablet Take 1 tablet (50 mg total) by mouth daily. 30 tablet 6  . Misc Natural Products (TART CHERRY ADVANCED PO) Take by mouth daily.    .  montelukast (SINGULAIR) 10 MG tablet Take 10 mg by mouth at bedtime.    . Omega-3 Fatty Acids (FISH OIL) 1200 MG CAPS Take 1,200 mg by mouth 2 (two) times daily.     Marland Kitchen pyridOXINE (VITAMIN B-6) 100 MG tablet Take 100 mg by mouth daily.    . tamsulosin (FLOMAX) 0.4 MG CAPS capsule Take 1 capsule (0.4 mg total) by mouth daily. 30 capsule 6  . Testosterone 1.62 % GEL 3 Pump daily.  1  . vitamin B-12 (CYANOCOBALAMIN) 1000 MCG tablet Take 1,000 mcg by mouth daily.     No current facility-administered medications for this visit.     Allergies:   Patient has no known allergies.   Social History:  The patient  reports that he quit smoking about 25 years ago. His smoking use included cigars. He quit smokeless tobacco use about 25 years ago.  His smokeless tobacco use included chew. He reports current alcohol use. He reports that he does not use drugs.   Family History:  The patient's family history includes Alzheimer's disease in his father; Dementia in his sister; Heart disease in his sister; Heart failure in his mother; Stroke in his mother.   ROS:  Please see the history of present illness.   Otherwise, review of systems is positive for hearing loss, visucla changes, muscle pain, dizziness, easy bruising.   All other systems are reviewed and negative.   PHYSICAL EXAM: VS:  BP 120/60   Pulse 60   Ht 5\' 11"  (1.803 m)   Wt 262 lb (118.8 kg)   BMI 36.54 kg/m  , BMI Body mass index is 36.54 kg/m. GEN: Well nourished, well developed, in no acute distress  HEENT: normal  Neck: no JVD, carotid bruits, or masses Cardiac: RRR; no murmurs, rubs, or gallops,no edema  Respiratory:  clear to auscultation bilaterally, normal work of breathing GI: soft, nontender, nondistended, + BS MS: no deformity or atrophy  Skin: warm and dry, device site well healed Neuro:  Strength and sensation are intact Psych: euthymic mood, full affect  EKG:  EKG is ordered today. Personal review of the ekg ordered shows AV  paced  Personal review of the device interrogation today. Results in [place]: 08/03/2018: ALT 23; BUN 21; Creat 0.86; Hemoglobin 14.7; Platelets 264; Potassium 4.2; Sodium 140    Lipid Panel  No results found for: CHOL, TRIG, HDL, CHOLHDL, VLDL, LDLCALC, LDLDIRECT   Wt Readings from Last 3 Encounters:  11/02/18 262 lb (118.8 kg)  09/30/18 263 lb (119.3 kg)  06/29/18 258 lb (117 kg)      Other studies Reviewed: Additional studies/ records that were reviewed today include: TTE 11/06/16  Review of the above records today demonstrates:  - Left ventricle: Septal hypokinesis. The cavity size was   moderately dilated. Wall thickness was increased in a pattern of   mild LVH. Systolic function was normal. The estimated ejection  fraction was in the range of 50% to 55%. - Aortic valve: There was trivial regurgitation. - Left atrium: The atrium was mildly dilated.   ASSESSMENT AND PLAN:  1.  Persistent atrial fibrillation: On Eliquis.  Status post AF ablation 04/03/2017.  No further episodes.   This patients CHA2DS2-VASc Score and unadjusted Ischemic Stroke Rate (% per year) is equal to 2.2 % stroke rate/year from a score of 2  Above score calculated as 1 point each if present [CHF, HTN, DM, Vascular=MI/PAD/Aortic Plaque, Age if 65-74, or Male] Above score calculated as 2 points each if present [Age > 75, or Stroke/TIA/TE]    2. Tachy/brady syndrome: Status post Catering manager.  Device functioning appropriately.  No changes.  3. Chronic diastolic heart failure: No obvious signs of volume overload.  4. OSA: Stressed CPAP compliance  5. Morbid obesity: Using diet and exercise.  Current medicines are reviewed at length with the patient today.   The patient does not have concerns regarding his medicines.  The following changes were made today: Stop metoprolol  Labs/ tests ordered today include:  Orders Placed This Encounter  Procedures    . EKG 12-Lead    Disposition:   FU with Eriko Economos 6 months  Signed, Rayfield Beem Meredith Leeds, MD  11/02/2018 2:44 PM     [place] [address] [place] [address] [phone] (office) [phone] (fax)

## 2018-11-03 LAB — CUP PACEART INCLINIC DEVICE CHECK
Date Time Interrogation Session: 20200304164732
Implantable Lead Implant Date: 20041025
Implantable Lead Implant Date: 20041025
Implantable Lead Location: 753859
Implantable Lead Location: 753860
Implantable Lead Model: 4457
Implantable Lead Serial Number: 338209
Implantable Lead Serial Number: 424134
Implantable Pulse Generator Implant Date: 20110712
Pulse Gen Serial Number: 598266

## 2019-01-09 ENCOUNTER — Other Ambulatory Visit: Payer: Self-pay | Admitting: Rheumatology

## 2019-01-10 NOTE — Telephone Encounter (Signed)
Last Visit: 09/30/18 Next Visit: 03/31/19 Labs: 08/3018 Uric acid is within desirable range. CBC and CMP WNL.  Okay to refill per Dr. Estanislado Pandy

## 2019-01-12 ENCOUNTER — Other Ambulatory Visit: Payer: Self-pay | Admitting: Rheumatology

## 2019-01-12 NOTE — Telephone Encounter (Signed)
Last Visit: 09/30/18 Next Visit: 03/31/19 Labs: 12/3019Uric acid is within desirable range. CBC and CMP WNL.  Okay to refill per Dr. Estanislado Pandy

## 2019-01-21 ENCOUNTER — Other Ambulatory Visit: Payer: Self-pay | Admitting: Rheumatology

## 2019-01-21 NOTE — Telephone Encounter (Signed)
Last Visit: 09/30/18 Next Visit: 03/31/19 Labs: 12/3019Uric acid is within desirable range. CBC and CMP WNL.  Okay to refill per Dr. Estanislado Pandy

## 2019-03-02 HISTORY — PX: CYST EXCISION: SHX5701

## 2019-03-17 NOTE — Progress Notes (Signed)
Office Visit Note  Patient: Shane [name]             Date of Birth: 1946-06-22           MRN: [id number]             PCP: Albina Billet, MD Referring: Albina Billet, MD Visit Date: 03/31/2019 Occupation: @GUAROCC @  Subjective:  Medication monitoring   History of Present Illness: Shane [name] is a 73 y.o. male with history of gout and osteoarthritis. He is taking Allopurinol 200 mg po daily and colchicine 0.3 mg po daily. He reports his last gout flare was in January 2020 after eating BBQ.  He states he saw Dr.Landau who prescribed prednisone.  He has been very compliant with dietary restrictions since then.  He has not had any recent gout flares.  He has  Uric acid 5.7 on 08/03/18   Activities of Daily Living:  Patient reports morning stiffness for 5-10 minutes.   Patient Reports nocturnal pain.  Difficulty dressing/grooming: Denies Difficulty climbing stairs: Denies Difficulty getting out of chair: Reports Difficulty using hands for taps, buttons, cutlery, and/or writing: Denies  Review of Systems  Constitutional: Negative for fatigue.  HENT: Negative for mouth sores, mouth dryness and nose dryness.   Eyes: Negative for pain, itching and dryness.  Respiratory: Negative for shortness of breath, wheezing and difficulty breathing.   Cardiovascular: Negative for chest pain, palpitations and swelling in legs/feet.  Gastrointestinal: Negative for abdominal pain, blood in stool, constipation and diarrhea.  Endocrine: Negative for increased urination.  Genitourinary: Negative for painful urination and pelvic pain.  Musculoskeletal: Positive for arthralgias, joint pain and morning stiffness. Negative for joint swelling.  Skin: Negative for rash and redness.  Allergic/Immunologic: Negative for susceptible to infections.  Neurological: Negative for dizziness, light-headedness, headaches, memory loss and weakness.  Hematological: Negative for swollen glands.  Psychiatric/Behavioral:  Negative for confusion. The patient is not nervous/anxious.     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   Elevated prostate specific antigen (PSA) 11/10/2017   Atypical atrial flutter ([place]) 04/22/2017   Status post catheter ablation of atrial fibrillation 04/04/2017   AF (atrial fibrillation) ([place]) 04/03/2017   Hypotension due to drugs 09/18/2015   OSA (obstructive sleep apnea) 03/30/2014   Acute on chronic diastolic CHF (congestive heart failure), NYHA class 2 ([place]) 03/30/2014   Obstructive sleep apnea syndrome 03/30/2014   Persistent atrial fibrillation 04/05/2013   Pacemaker for sinus node dysfunction 04/05/2013   Chronic diastolic CHF (congestive heart failure) ([place]) 04/05/2013   Obesity, morbid (more than 100 lbs over ideal weight or BMI > 40) ([place]) 04/05/2013   Chronic diastolic heart failure ([place]) 04/05/2013   Morbid obesity ([place]) 04/05/2013   Presence of cardiac pacemaker 04/05/2013   Atrial fibrillation ([place]) 04/05/2013    Past Medical History:  Diagnosis Date   Atrioventricular block    Degenerative joint disease    Paroxysmal atrial fibrillation (HCC)    Presence of permanent cardiac pacemaker 03/12/10   guidant   Sinus node dysfunction (HCC)     Family History  Problem Relation Age of Onset   Stroke Mother    Heart failure Mother    Alzheimer's disease Father  Heart disease Sister    Dementia Sister    Past Surgical History:  Procedure Laterality Date   ATRIAL FIBRILLATION ABLATION N/A 04/03/2017   Procedure: Atrial Fibrillation Ablation;  Surgeon: Constance Haw, MD;  Location: [facility];  Service: Cardiovascular;  Laterality:  N/A;   CARDIAC CATHETERIZATION  10/13/1994   No CAD   CARDIAC CATHETERIZATION  05/10/2003   No CAD   CARDIOVERSION N/A 10/21/2016   Procedure: CARDIOVERSION;  Surgeon: Sanda Klein, MD;  Location: [facility];  Service: Cardiovascular;  Laterality: N/A;   CARDIOVERSION N/A 05/15/2017   Procedure: CARDIOVERSION;  Surgeon: Fay Records, MD;  Location: [facility];  Service: Cardiovascular;  Laterality: N/A;   CYST EXCISION  03/2019   on back    [place]  05/07/2011   Lexiscan: No ishcemia   PERMANENT PACEMAKER INSERTION  03/12/2010   guidant   [employer] ARTHROSCOPY     US ECHOCARDIOGRAPHY  05/07/2011   mod. LVH,LA mod. dilated,borderline aortic root dilatation   Social History   Social History Narrative   Not on file   Immunization History  Administered Date(s) Administered   Zoster Recombinat (Shingrix) 11/04/2017     Objective: Vital Signs: BP 105/62 (BP Location: Left Arm, Patient Position: Sitting, Cuff Size: Normal)    Pulse (!) 58    Resp 14    Ht 5\' 11"  (1.803 m)    Wt 269 lb 6.4 oz (122.2 kg)    BMI 37.57 kg/m    Physical Exam Vitals signs and nursing note reviewed.  Constitutional:      Appearance: He is well-developed.  HENT:     Head: Normocephalic and atraumatic.  Eyes:     Conjunctiva/sclera: Conjunctivae normal.     Pupils: Pupils are equal, round, and reactive to light.  Neck:     Musculoskeletal: Normal range of motion and neck supple.  Cardiovascular:     Rate and Rhythm: Normal rate and regular rhythm.     Heart sounds: Normal heart sounds.  Pulmonary:     Effort: Pulmonary effort is normal.     Breath sounds: Normal breath sounds.  Abdominal:     General: Bowel sounds are normal.     Palpations: Abdomen is soft.  Skin:    General: Skin is warm and dry.     Capillary Refill: Capillary refill takes less than 2 seconds.  Neurological:     Mental Status: He is alert and oriented to person,  place, and time.  Psychiatric:        Behavior: Behavior normal.      Musculoskeletal Exam: C-spine, thoracic spine, and lumbar spine good ROM.  No midline spinal tenderness.  No SI joint tenderness.  Shoulder joints, elbow joints, wrist joints, MCPs,PIPs, and DIPs good ROM with no synovitis.  PIP and DIP synovial thickening consistent with osteoarthritis of both hands.  Hip joints, knee joints, ankle joints, MTPs, PIPs, and DIPs good ROM with no synovitis.  No warmth or effusion of knee joints.  No ankle joint tenderness or swelling.    CDAI Exam: CDAI Score: -- Patient Global: --; Provider Global: -- Swollen: --; Tender: -- Joint Exam   No joint exam has been documented for this visit   There is currently no information documented on the homunculus. Go to the Rheumatology activity and complete the homunculus joint exam.  Investigation: No additional findings.  Imaging: No results found.  Recent Labs: Lab Results  Component Value Date   WBC 6.3 08/03/2018   HGB 14.7 08/03/2018   PLT 264 08/03/2018   NA 140 08/03/2018   K 4.2 08/03/2018   CL 100 08/03/2018   CO2 34 (H) 08/03/2018   GLUCOSE 74 08/03/2018   BUN 21 08/03/2018   CREATININE 0.86 08/03/2018   BILITOT 0.7 08/03/2018   ALKPHOS 35 (L) 05/05/2017   AST 21 08/03/2018   ALT 23 08/03/2018   PROT 6.8 08/03/2018   ALBUMIN 3.8 05/05/2017   CALCIUM 9.8 08/03/2018   GFRAA 99 08/03/2018    Speciality Comments: No specialty comments available.  Procedures:  No procedures performed Allergies: Patient has no known allergies.   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  Not crystal proven.  He is clinically doing well on Allopurinol 200 mg po daily and colchicine 0.3 mg po daily.  He had a gout flare 6 months ago after eating BBQ pork.  He had an appointment with Dr. Mardelle Matte who prescribed him prednisone which resolved his symptoms.  He has not  had any recent gout flares.  He has no joint pain or joint swelling at this time.  He has been compliant with dietary restrictions recently.  We discussed the importance of limiting purine rich foods.  He will continue taking Allopurinol 200 mg po daily and will start taking colchicine 0.3 mg po as needed during gout flares. He does not need any refills at this time. His uric acid was 5.7 on 08/03/18.  We will check uric acid level today.  He was advised to notify us if he develops signs or symptoms of a gout flare.  He will follow up in 6 months.- Plan: Uric acid  Medication management - Allopurinol 200 mg by mouth daily and colchicine 0.3 mg by mouth daily PRN.  We will check uric acid, CBC, and CMP today.  - Plan: Uric acid, CBC with Differential/Platelet, COMPLETE METABOLIC PANEL WITH GFR  History of rotator cuff tear - Right, Dr. Mardelle Matte - He sees Dr. Mardelle Matte on a regular basis for cortisone injections.  He has good ROM with no discomfort.  No tenderness at this time.   Primary osteoarthritis of right knee: He has good ROM with no discomfort.  No warmth or effusion of the right knee joint.   Other medical conditions are listed as follows:   Chronic diastolic heart failure (HCC)  Presence of cardiac pacemaker   Persistent atrial fibrillation   OSA (obstructive sleep apnea)   Orders: Orders Placed This Encounter  Procedures   Uric acid   CBC with Differential/Platelet   COMPLETE METABOLIC PANEL WITH GFR   No orders of the defined types were placed in this encounter.    Follow-Up Instructions: Return in about 6 months (around 10/01/2019) for Gout, Osteoarthritis.   Ofilia Neas, PA-C  Note - This record has been created using Dragon software.  Chart creation errors have been sought, but may not always  have been located. Such creation errors do not reflect on  the standard of medical care.

## 2019-03-29 ENCOUNTER — Other Ambulatory Visit: Payer: Self-pay | Admitting: Rheumatology

## 2019-03-29 NOTE — Telephone Encounter (Signed)
Last Visit: 09/30/18 Next Visit: 03/31/19 Labs: 12/3019Uric acid is within desirable range. CBC and CMP WNL.  Patient to update labs at appointment on 03/31/19  Okay to refill per Dr. Estanislado Pandy

## 2019-03-31 ENCOUNTER — Encounter: Payer: Self-pay | Admitting: Physician Assistant

## 2019-03-31 ENCOUNTER — Ambulatory Visit (INDEPENDENT_AMBULATORY_CARE_PROVIDER_SITE_OTHER): Payer: BC Managed Care – PPO | Admitting: Physician Assistant

## 2019-03-31 ENCOUNTER — Other Ambulatory Visit: Payer: Self-pay

## 2019-03-31 VITALS — BP 105/62 | HR 58 | Resp 14 | Ht 71.0 in | Wt 269.4 lb

## 2019-03-31 DIAGNOSIS — Z79899 Other long term (current) drug therapy: Secondary | ICD-10-CM | POA: Diagnosis not present

## 2019-03-31 DIAGNOSIS — M1A09X0 Idiopathic chronic gout, multiple sites, without tophus (tophi): Secondary | ICD-10-CM | POA: Diagnosis not present

## 2019-03-31 DIAGNOSIS — G4733 Obstructive sleep apnea (adult) (pediatric): Secondary | ICD-10-CM

## 2019-03-31 DIAGNOSIS — I5032 Chronic diastolic (congestive) heart failure: Secondary | ICD-10-CM

## 2019-03-31 DIAGNOSIS — Z950 Presence of cardiac pacemaker: Secondary | ICD-10-CM

## 2019-03-31 DIAGNOSIS — M1711 Unilateral primary osteoarthritis, right knee: Secondary | ICD-10-CM

## 2019-03-31 DIAGNOSIS — Z8739 Personal history of other diseases of the musculoskeletal system and connective tissue: Secondary | ICD-10-CM

## 2019-03-31 DIAGNOSIS — I4819 Other persistent atrial fibrillation: Secondary | ICD-10-CM

## 2019-04-01 LAB — COMPLETE METABOLIC PANEL WITH GFR
AG Ratio: 1.6 (calc) (ref 1.0–2.5)
ALT: 23 U/L (ref 9–46)
AST: 25 U/L (ref 10–35)
Albumin: 4.2 g/dL (ref 3.6–5.1)
Alkaline phosphatase (APISO): 47 U/L (ref 35–144)
BUN: 15 mg/dL (ref 7–25)
CO2: 27 mmol/L (ref 20–32)
Calcium: 9.8 mg/dL (ref 8.6–10.3)
Chloride: 101 mmol/L (ref 98–110)
Creat: 0.85 mg/dL (ref 0.70–1.18)
GFR, Est African American: 99 mL/min/{1.73_m2} (ref >=60)
GFR, Est Non African American: 86 mL/min/{1.73_m2} (ref >=60)
Globulin: 2.6 g/dL (calc) (ref 1.9–3.7)
Glucose, Bld: 138 mg/dL — ABNORMAL HIGH (ref 65–99)
Potassium: 4 mmol/L (ref 3.5–5.3)
Sodium: 139 mmol/L (ref 135–146)
Total Bilirubin: 0.8 mg/dL (ref 0.2–1.2)
Total Protein: 6.8 g/dL (ref 6.1–8.1)

## 2019-04-01 LAB — CBC WITH DIFFERENTIAL/PLATELET
Absolute Monocytes: 413 cells/uL (ref 200–950)
Basophils Absolute: 50 cells/uL (ref 0–200)
Basophils Relative: 0.9 %
Eosinophils Absolute: 72 cells/uL (ref 15–500)
Eosinophils Relative: 1.3 %
HCT: 44.5 % (ref 38.5–50.0)
Hemoglobin: 15.6 g/dL (ref 13.2–17.1)
Lymphs Abs: 1584 cells/uL (ref 850–3900)
MCH: 33.5 pg — ABNORMAL HIGH (ref 27.0–33.0)
MCHC: 35.1 g/dL (ref 32.0–36.0)
MCV: 95.5 fL (ref 80.0–100.0)
MPV: 10.9 fL (ref 7.5–12.5)
Monocytes Relative: 7.5 %
Neutro Abs: 3383 cells/uL (ref 1500–7800)
Neutrophils Relative %: 61.5 %
Platelets: 248 10*3/uL (ref 140–400)
RBC: 4.66 10*6/uL (ref 4.20–5.80)
RDW: 13.2 % (ref 11.0–15.0)
Total Lymphocyte: 28.8 %
WBC: 5.5 10*3/uL (ref 3.8–10.8)

## 2019-04-01 LAB — URIC ACID: Uric Acid, Serum: 6.1 mg/dL (ref 4.0–8.0)

## 2019-04-01 NOTE — Progress Notes (Signed)
Uric acid is 6.1.  Ideally his uric acid should be <6.  Please advise patient to follow dietary restrictions.  Glucose is elevated-138.  Rest of CMP WNL.  CBC WNL.

## 2019-05-03 ENCOUNTER — Encounter: Payer: Self-pay | Admitting: Cardiology

## 2019-05-03 ENCOUNTER — Other Ambulatory Visit: Payer: Self-pay

## 2019-05-03 ENCOUNTER — Ambulatory Visit: Payer: BC Managed Care – PPO | Admitting: Cardiology

## 2019-05-03 VITALS — BP 112/80 | HR 60 | Ht 71.0 in | Wt 258.2 lb

## 2019-05-03 DIAGNOSIS — I4819 Other persistent atrial fibrillation: Secondary | ICD-10-CM | POA: Diagnosis not present

## 2019-05-03 NOTE — Progress Notes (Signed)
Electrophysiology Office Note   Date:  05/03/2019   ID:  Antwoine, Mcguffie September 01, 1946, MRN [id number]  PCP:  Albina Billet, MD  Cardiologist:  [place] Primary Electrophysiologist:  Will Meredith Leeds, MD    No chief complaint on file.    History of Present Illness: ANNE MANEVAL is a 73 y.o. male who is being seen today for the evaluation of atrial fibrillation at the request of Albina Billet, MD. Presenting today for electrophysiology evaluation. He presents for evaluation of persistent atrial fibrillation. He has a pacemaker in place that shows 17% ventricular pacing. He has been in atrial fibrillation since January 24. Attempted cardioversion on February 20 failed after 2 shocks. Left atrium was severely dilated. 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 He had an atrial fibrillation ablation 05/15/17.  Today, denies symptoms of palpitations, chest pain, shortness of breath, orthopnea, PND, lower extremity edema, claudication, dizziness, presyncope, syncope, bleeding, or neurologic sequela. The patient is tolerating medications without difficulties.     Past Medical History:  Diagnosis Date  . Atrioventricular block   . Degenerative joint disease   . Paroxysmal atrial fibrillation (HCC)   . 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 Sinus node dysfunction [facility])    Past Surgical History:  Procedure Laterality Date  . ATRIAL FIBRILLATION ABLATION N/A 04/03/2017   Procedure: Atrial Fibrillation Ablation;  Surgeon: Constance Haw, MD;  Location: [facility];  Service: Cardiovascular;  Laterality: N/A;  . CARDIAC CATHETERIZATION  10/13/1994   No CAD  . CARDIAC CATHETERIZATION  05/10/2003   No CAD  . CARDIOVERSION N/A 10/21/2016   Procedure: CARDIOVERSION;  Surgeon: Sanda Klein, MD;  Location: [facility] ENDOSCOPY;  Service: Cardiovascular;  Laterality: N/A;  . CARDIOVERSION N/A 05/15/2017   Procedure: CARDIOVERSION;  Surgeon:  Fay Records, MD;  Location: [facility] ENDOSCOPY;  Service: Cardiovascular;  Laterality: N/A;  . CYST EXCISION  03/2019   on back   . NM MYOCAR PERF WALL MOTION  05/07/2011   Lexiscan: No ishcemia  . PERMANENT PACEMAKER INSERTION  03/12/2010   guidant  . ROTATOR CUFF REPAIR  1996  . SHOULDER ARTHROSCOPY    . US ECHOCARDIOGRAPHY  05/07/2011   mod. LVH,LA mod. dilated,borderline aortic root dilatation     Current Outpatient Medications  Medication Sig Dispense Refill  . allopurinol (ZYLOPRIM) 100 MG tablet Take 2 tablets (200 mg total) by mouth daily. 60 tablet 0  . anastrozole (ARIMIDEX) 1 MG tablet Take 1 tablet by mouth daily.    Marland Kitchen apixaban (ELIQUIS) 5 MG TABS tablet Take 1 tablet (5 mg total) by mouth 2 (two) times daily. 60 tablet 5  . Ascorbic Acid (VITAMIN C) 1000 MG tablet Take 1,000 mg by mouth daily.    . Calcium Citrate (CITRACAL PO) Take by mouth daily. Citracal Tablets as directed daily    . Cholecalciferol (VITAMIN D-3) 1000 UNITS CAPS Take 1 capsule by mouth daily.    . citalopram (CELEXA) 40 MG tablet Take 1 tablet (40 mg total) by mouth daily. MUST RECEIVE FUTURE REFILLS FROM PCP. 30 tablet 0  . co-enzyme Q-10 50 MG capsule Take by mouth.    . colchicine 0.6 MG tablet Take 0.5 tablets (0.3 mg total) by mouth daily. 45 tablet 0  . diltiazem (CARDIZEM CD) 240 MG 24 hr capsule Take 1 capsule (240 mg total) by mouth daily. 30 capsule 5  . furosemide (LASIX) 40 MG tablet Take 1 tablet (40 mg  total) by mouth daily. 30 tablet 6  . Misc Natural Products (TART CHERRY ADVANCED PO) Take by mouth daily.    . montelukast (SINGULAIR) 10 MG tablet Take 10 mg by mouth at bedtime.    . Omega-3 Fatty Acids (FISH OIL) 1200 MG CAPS Take 1,200 mg by mouth 2 (two) times daily.     Marland Kitchen pyridOXINE (VITAMIN B-6) 100 MG tablet Take 100 mg by mouth daily.    . tamsulosin (FLOMAX) 0.4 MG CAPS capsule Take 1 capsule (0.4 mg total) by mouth daily. 30 capsule 6  . Testosterone 1.62 % GEL 3 Pump daily.  1  .  vitamin B-12 (CYANOCOBALAMIN) 1000 MCG tablet Take 1,000 mcg by mouth daily.     No current facility-administered medications for this visit.     Allergies:   Patient has no known allergies.   Social History:  The patient  reports that he quit smoking about 25 years ago. His smoking use included cigars. He quit smokeless tobacco use about 25 years ago.  His smokeless tobacco use included chew. He reports current alcohol use. He reports that he does not use drugs.   Family History:  The patient's family history includes Alzheimer's disease in his father; Dementia in his sister; Heart disease in his sister; Heart failure in his mother; Stroke in his mother.   ROS:  Please see the history of present illness.   Otherwise, review of systems is positive for none.   All other systems are reviewed and negative.   PHYSICAL EXAM: VS:  BP 112/80   Pulse 60   Ht 5\' 11"  (1.803 m)   Wt 258 lb 3.2 oz (117.1 kg)   SpO2 95%   BMI 36.01 kg/m  , BMI Body mass index is 36.01 kg/m. GEN: Well nourished, well developed, in no acute distress  HEENT: normal  Neck: no JVD, carotid bruits, or masses Cardiac: RRR; no murmurs, rubs, or gallops,no edema  Respiratory:  clear to auscultation bilaterally, normal work of breathing GI: soft, nontender, nondistended, + BS MS: no deformity or atrophy  Skin: warm and dry, device site well healed Neuro:  Strength and sensation are intact Psych: euthymic mood, full affect  EKG:  EKG is ordered today. Personal review of the ekg ordered shows AV paced  Personal review of the device interrogation today. Results in [place]: 03/31/2019: ALT 23; BUN 15; Creat 0.85; Hemoglobin 15.6; Platelets 248; Potassium 4.0; Sodium 139    Lipid Panel  No results found for: CHOL, TRIG, HDL, CHOLHDL, VLDL, LDLCALC, LDLDIRECT   Wt Readings from Last 3 Encounters:  05/03/19 258 lb 3.2 oz (117.1 kg)  03/31/19 269 lb 6.4 oz (122.2 kg)  11/02/18 262 lb (118.8 kg)       Other studies Reviewed: Additional studies/ records that were reviewed today include: TTE 11/06/16  Review of the above records today demonstrates:  - Left ventricle: Septal hypokinesis. The cavity size was   moderately dilated. Wall thickness was increased in a pattern of   mild LVH. Systolic function was normal. The estimated ejection   fraction was in the range of 50% to 55%. - Aortic valve: There was trivial regurgitation. - Left atrium: The atrium was mildly dilated.   ASSESSMENT AND PLAN:  1.  Persistent atrial fibrillation: Currently on Eliquis status post AF ablation 04/30/2017.  7 hours of AF since March. No changes.   This patients CHA2DS2-VASc Score and unadjusted Ischemic Stroke Rate (% per year) is equal to  2.2 % stroke rate/year from a score of 2  Above score calculated as 1 point each if present [CHF, HTN, DM, Vascular=MI/PAD/Aortic Plaque, Age if 65-74, or Male] Above score calculated as 2 points each if present [Age > 75, or Stroke/TIA/TE]   2. Tachy/brady syndrome: Status post Catering manager.  Device functioning appropriately.  No changes.  3. Chronic diastolic heart failure: No obvious volume overload  4. OSA: CPAP compliance encouraged  5. Morbid obesity: Using diet and exercise  Current medicines are reviewed at length with the patient today.   The patient does not have concerns regarding his medicines.  The following changes were made today: none  Labs/ tests ordered today include:  Orders Placed This Encounter  Procedures  . EKG 12-Lead    Disposition:   FU with Will Camnitz 12 months  Signed, Will Meredith Leeds, MD  05/03/2019 2:37 PM     [facility] [address] [place] [address] [address] [phone] (office) [phone] (fax)

## 2019-05-11 ENCOUNTER — Other Ambulatory Visit: Payer: Self-pay

## 2019-05-11 MED ORDER — APIXABAN 5 MG PO TABS: 5.0000 mg | ORAL_TABLET | Freq: Two times a day (BID) | ORAL | 11 refills | Status: DC

## 2019-05-12 ENCOUNTER — Other Ambulatory Visit: Payer: Self-pay

## 2019-05-12 ENCOUNTER — Encounter: Payer: Self-pay | Admitting: *Deleted

## 2019-05-13 ENCOUNTER — Other Ambulatory Visit
Admission: RE | Admit: 2019-05-13 | Discharge: 2019-05-13 | Disposition: A | Discharge status: Home / Self Care | Payer: BC Managed Care – PPO | Source: Ambulatory Visit | Attending: Ophthalmology | Admitting: Ophthalmology

## 2019-05-13 DIAGNOSIS — Z01812 Encounter for preprocedural laboratory examination: Secondary | ICD-10-CM | POA: Insufficient documentation

## 2019-05-13 DIAGNOSIS — H2512 Age-related nuclear cataract, left eye: Secondary | ICD-10-CM | POA: Diagnosis not present

## 2019-05-13 DIAGNOSIS — Z20828 Contact with and (suspected) exposure to other viral communicable diseases: Secondary | ICD-10-CM | POA: Insufficient documentation

## 2019-05-14 LAB — SARS CORONAVIRUS 2 (TAT 6-24 HRS): SARS Coronavirus 2: NEGATIVE

## 2019-05-16 NOTE — Discharge Instructions (Signed)
General Anesthesia, Adult, Care After °This sheet gives you information about how to care for yourself after your procedure. Your health care provider may also give you more specific instructions. If you have problems or questions, contact your health care provider. °What can I expect after the procedure? °After the procedure, the following side effects are common: °· Pain or discomfort at the IV site. °· Nausea. °· Vomiting. °· Sore throat. °· Trouble concentrating. °· Feeling cold or chills. °· Weak or tired. °· Sleepiness and fatigue. °· Soreness and body aches. These side effects can affect parts of the body that were not involved in surgery. °Follow these instructions at home: ° °For at least 24 hours after the procedure: °· Have a responsible adult stay with you. It is important to have someone help care for you until you are awake and alert. °· Rest as needed. °· Do not: °? Participate in activities in which you could fall or become injured. °? Drive. °? Use heavy machinery. °? Drink alcohol. °? Take sleeping pills or medicines that cause drowsiness. °? Make important decisions or sign legal documents. °? Take care of children on your own. °Eating and drinking °· Follow any instructions from your health care provider about eating or drinking restrictions. °· When you feel hungry, start by eating small amounts of foods that are soft and easy to digest (bland), such as toast. Gradually return to your regular diet. °· Drink enough fluid to keep your urine pale yellow. °· If you vomit, rehydrate by drinking water, juice, or clear broth. °General instructions °· If you have sleep apnea, surgery and certain medicines can increase your risk for breathing problems. Follow instructions from your health care provider about wearing your sleep device: °? Anytime you are sleeping, including during daytime naps. °? While taking prescription pain medicines, sleeping medicines, or medicines that make you drowsy. °· Return to  your normal activities as told by your health care provider. Ask your health care provider what activities are safe for you. °· Take over-the-counter and prescription medicines only as told by your health care provider. °· If you smoke, do not smoke without supervision. °· Keep all follow-up visits as told by your health care provider. This is important. °Contact a health care provider if: °· You have nausea or vomiting that does not get better with medicine. °· You cannot eat or drink without vomiting. °· You have pain that does not get better with medicine. °· You are unable to pass urine. °· You develop a skin rash. °· You have a fever. °· You have redness around your IV site that gets worse. °Get help right away if: °· You have difficulty breathing. °· You have chest pain. °· You have blood in your urine or stool, or you vomit blood. °Summary °· After the procedure, it is common to have a sore throat or nausea. It is also common to feel tired. °· Have a responsible adult stay with you for the first 24 hours after general anesthesia. It is important to have someone help care for you until you are awake and alert. °· When you feel hungry, start by eating small amounts of foods that are soft and easy to digest (bland), such as toast. Gradually return to your regular diet. °· Drink enough fluid to keep your urine pale yellow. °· Return to your normal activities as told by your health care provider. Ask your health care provider what activities are safe for you. °This information is not   intended to replace advice given to you by your health care provider. Make sure you discuss any questions you have with your health care provider. °Document Released: 11/24/2000 Document Revised: 08/21/2017 Document Reviewed: 04/03/2017 °Elsevier Patient Education © 2020 [employer] °Cataract Surgery, Care After °This sheet gives you information about how to care for yourself after your procedure. Your health care provider may also  give you more specific instructions. If you have problems or questions, contact your health care provider. °What can I expect after the procedure? °After the procedure, it is common to have: °· Itching. °· Discomfort. °· Fluid discharge. °· Sensitivity to light and to touch. °· Bruising in or around the eye. °· Mild blurred vision. °Follow these instructions at home: °Eye care ° °· Do not touch or rub your eyes. °· Protect your eyes as told by your health care provider. You may be told to wear a protective eye shield or sunglasses. °· Do not put a contact lens into the affected eye or eyes until your health care provider approves. °· Keep the area around your eye clean and dry: °? Avoid swimming. °? Do not allow water to hit you directly in the face while showering. °? Keep soap and shampoo out of your eyes. °· Check your eye every day for signs of infection. Watch for: °? Redness, swelling, or pain. °? Fluid, blood, or pus. °? Warmth. °? A bad smell. °? Vision that is getting worse. °? Sensitivity that is getting worse. °Activity °· Do not drive for 24 hours if you were given a sedative during your procedure. °· Avoid strenuous activities, such as playing contact sports, for as long as told by your health care provider. °· Do not drive or use heavy machinery until your health care provider approves. °· Do not bend or lift heavy objects. Bending increases pressure in the eye. You can walk, climb stairs, and do light household chores. °· Ask your health care provider when you can return to work. If you work in a dusty environment, you may be advised to wear protective eyewear for a period of time. °General instructions °· Take or apply over-the-counter and prescription medicines only as told by your health care provider. This includes eye drops. °· Keep all follow-up visits as told by your health care provider. This is important. °Contact a health care provider if: °· You have increased bruising around your  eye. °· You have pain that is not helped with medicine. °· You have a fever. °· You have redness, swelling, or pain in your eye. °· You have fluid, blood, or pus coming from your incision. °· Your vision gets worse. °· Your sensitivity to light gets worse. °Get help right away if: °· You have sudden loss of vision. °· You see flashes of light or spots (floaters). °· You have severe eye pain. °· You develop nausea or vomiting. °Summary °· After your procedure, it is common to have itching, discomfort, bruising, fluid discharge, or sensitivity to light. °· Follow instructions from your health care provider about caring for your eye after the procedure. °· Do not rub your eye after the procedure. You may need to wear eye protection or sunglasses. Do not wear contact lenses. Keep the area around your eye clean and dry. °· Avoid activities that require a lot of effort. These include playing sports and lifting heavy objects. °· Contact a health care provider if you have increased bruising, pain that does not go away, or a fever. Get   help right away if you suddenly lose your vision, see flashes of light or spots, or have severe pain in the eye. °This information is not intended to replace advice given to you by your health care provider. Make sure you discuss any questions you have with your health care provider. °Document Released: 03/07/2005 Document Revised: 02/15/2018 Document Reviewed: 02/15/2018 °Elsevier Patient Education © 2020 [employer] ° °

## 2019-05-18 ENCOUNTER — Ambulatory Visit
Admission: RE | Admit: 2019-05-18 | Discharge: 2019-05-18 | Disposition: A | Discharge status: Home / Self Care | Payer: BC Managed Care – PPO | Attending: Ophthalmology | Admitting: Ophthalmology

## 2019-05-18 ENCOUNTER — Ambulatory Visit: Payer: BC Managed Care – PPO | Admitting: Anesthesiology

## 2019-05-18 ENCOUNTER — Encounter
Admission: RE | Disposition: A | Discharge status: Home / Self Care | Payer: Self-pay | Source: Home / Self Care | Attending: Ophthalmology

## 2019-05-18 DIAGNOSIS — Z7901 Long term (current) use of anticoagulants: Secondary | ICD-10-CM | POA: Insufficient documentation

## 2019-05-18 DIAGNOSIS — I4891 Unspecified atrial fibrillation: Secondary | ICD-10-CM | POA: Insufficient documentation

## 2019-05-18 DIAGNOSIS — Z950 Presence of cardiac pacemaker: Secondary | ICD-10-CM | POA: Diagnosis not present

## 2019-05-18 DIAGNOSIS — Z87891 Personal history of nicotine dependence: Secondary | ICD-10-CM | POA: Insufficient documentation

## 2019-05-18 DIAGNOSIS — G4730 Sleep apnea, unspecified: Secondary | ICD-10-CM | POA: Diagnosis not present

## 2019-05-18 DIAGNOSIS — H2512 Age-related nuclear cataract, left eye: Secondary | ICD-10-CM | POA: Insufficient documentation

## 2019-05-18 DIAGNOSIS — I509 Heart failure, unspecified: Secondary | ICD-10-CM | POA: Diagnosis not present

## 2019-05-18 DIAGNOSIS — M109 Gout, unspecified: Secondary | ICD-10-CM | POA: Insufficient documentation

## 2019-05-18 HISTORY — PX: CATARACT EXTRACTION W/PHACO: SHX586

## 2019-05-18 HISTORY — DX: Gout, unspecified: M10.9

## 2019-05-18 HISTORY — DX: Presence of external hearing-aid: Z97.4

## 2019-05-18 HISTORY — DX: Sleep apnea, unspecified: G47.30

## 2019-05-18 SURGERY — PHACOEMULSIFICATION, CATARACT, WITH IOL INSERTION
Anesthesia: Monitor Anesthesia Care | Site: Eye | Laterality: Left

## 2019-05-18 MED ORDER — EPINEPHRINE PF 1 MG/ML IJ SOLN
INTRAOCULAR | Status: DC | PRN
  Administered 2019-05-18: 71 mL via OPHTHALMIC

## 2019-05-18 MED ORDER — TETRACAINE HCL 0.5 % OP SOLN
1.0000 [drp] | OPHTHALMIC | Status: DC | PRN
  Administered 2019-05-18 (×3): 1 [drp] via OPHTHALMIC

## 2019-05-18 MED ORDER — NA HYALUR & NA CHOND-NA HYALUR 0.4-0.35 ML IO KIT
PACK | INTRAOCULAR | Status: DC | PRN
  Administered 2019-05-18: 1 mL via INTRAOCULAR

## 2019-05-18 MED ORDER — LACTATED RINGERS IV SOLN: 100.0000 mL/h | INTRAVENOUS | Status: DC

## 2019-05-18 MED ORDER — LIDOCAINE HCL (PF) 2 % IJ SOLN
INTRAOCULAR | Status: DC | PRN
  Administered 2019-05-18: 1 mL

## 2019-05-18 MED ORDER — FENTANYL CITRATE (PF) 100 MCG/2ML IJ SOLN
INTRAMUSCULAR | Status: DC | PRN
  Administered 2019-05-18 (×2): 50 ug via INTRAVENOUS

## 2019-05-18 MED ORDER — MOXIFLOXACIN HCL 0.5 % OP SOLN
1.0000 [drp] | OPHTHALMIC | Status: DC | PRN
  Administered 2019-05-18 (×3): 1 [drp] via OPHTHALMIC

## 2019-05-18 MED ORDER — ARMC OPHTHALMIC DILATING DROPS
1.0000 "application " | OPHTHALMIC | Status: DC | PRN
  Administered 2019-05-18 (×3): 1 via OPHTHALMIC

## 2019-05-18 MED ORDER — ACETAMINOPHEN 325 MG PO TABS: 325.0000 mg | ORAL_TABLET | ORAL | Status: DC | PRN

## 2019-05-18 MED ORDER — CEFUROXIME OPHTHALMIC INJECTION 1 MG/0.1 ML
INJECTION | OPHTHALMIC | Status: DC | PRN
  Administered 2019-05-18: 0.1 mL via INTRACAMERAL

## 2019-05-18 MED ORDER — ACETAMINOPHEN 160 MG/5ML PO SOLN: 325.0000 mg | ORAL | Status: DC | PRN

## 2019-05-18 MED ORDER — MIDAZOLAM HCL 2 MG/2ML IJ SOLN
INTRAMUSCULAR | Status: DC | PRN
  Administered 2019-05-18: 2 mg via INTRAVENOUS

## 2019-05-18 MED ORDER — ONDANSETRON HCL 4 MG/2ML IJ SOLN: 4.0000 mg | Freq: Once | INTRAMUSCULAR | Status: DC | PRN

## 2019-05-18 MED ORDER — BRIMONIDINE TARTRATE-TIMOLOL 0.2-0.5 % OP SOLN
OPHTHALMIC | Status: DC | PRN
  Administered 2019-05-18: 1 [drp] via OPHTHALMIC

## 2019-05-18 SURGICAL SUPPLY — 26 items
CANNULA ANT/CHMB 27G (MISCELLANEOUS) ×1 IMPLANT
CANNULA ANT/CHMB 27GA (MISCELLANEOUS) ×3 IMPLANT
GLOVE SURG LX 7.5 STRW (GLOVE) ×4
GLOVE SURG LX STRL 7.5 STRW (GLOVE) ×1 IMPLANT
GLOVE SURG TRIUMPH 8.0 PF LTX (GLOVE) ×3 IMPLANT
GOWN STRL REUS W/ TWL LRG LVL3 (GOWN DISPOSABLE) ×2 IMPLANT
GOWN STRL REUS W/TWL LRG LVL3 (GOWN DISPOSABLE) ×4
LENS IOL TECNIS ITEC 20.0 (Intraocular Lens) ×2 IMPLANT
MARKER SKIN DUAL TIP RULER LAB (MISCELLANEOUS) ×3 IMPLANT
NDL FILTER BLUNT 18X1 1/2 (NEEDLE) IMPLANT
NDL RETROBULBAR .5 NSTRL (NEEDLE) IMPLANT
NEEDLE FILTER BLUNT 18X 1/2SAF (NEEDLE)
NEEDLE FILTER BLUNT 18X1 1/2 (NEEDLE) IMPLANT
PACK CATARACT BRASINGTON (MISCELLANEOUS) ×3 IMPLANT
PACK EYE AFTER SURG (MISCELLANEOUS) ×3 IMPLANT
PACK OPTHALMIC (MISCELLANEOUS) ×3 IMPLANT
RING MALYGIN 7.0 (MISCELLANEOUS) IMPLANT
SUT ETHILON 10-0 CS-B-6CS-B-6 (SUTURE)
SUT VICRYL  9 0 (SUTURE)
SUT VICRYL 9 0 (SUTURE) IMPLANT
SUTURE EHLN 10-0 CS-B-6CS-B-6 (SUTURE) IMPLANT
SYR 3ML LL SCALE MARK (SYRINGE) ×3 IMPLANT
SYR 5ML LL (SYRINGE) IMPLANT
SYR TB 1ML LUER SLIP (SYRINGE) ×3 IMPLANT
WATER STERILE IRR 500ML POUR (IV SOLUTION) ×3 IMPLANT
WIPE NON LINTING 3.25X3.25 (MISCELLANEOUS) ×3 IMPLANT

## 2019-05-18 NOTE — Anesthesia Preprocedure Evaluation (Signed)
Anesthesia Evaluation  Patient identified by MRN, date of birth, ID band Patient awake    Reviewed: Allergy & Precautions, H&P , NPO status , Patient's Chart, lab work & pertinent test results, reviewed documented beta blocker date and time   Airway Mallampati: II  TM Distance: >3 FB Neck ROM: full    Dental no notable dental hx.    Pulmonary sleep apnea , former smoker,    Pulmonary exam normal breath sounds clear to auscultation       Cardiovascular Exercise Tolerance: Good +CHF  + dysrhythmias (s/p ablation) Atrial Fibrillation + pacemaker  Rhythm:regular Rate:Normal     Neuro/Psych negative neurological ROS  negative psych ROS   GI/Hepatic negative GI ROS, Neg liver ROS,   Endo/Other  negative endocrine ROS  Renal/GU negative Renal ROS  negative genitourinary   Musculoskeletal   Abdominal   Peds  Hematology negative hematology ROS (+)   Anesthesia Other Findings   Reproductive/Obstetrics negative OB ROS                             Anesthesia Physical Anesthesia Plan  ASA: III  Anesthesia Plan: MAC   Post-op Pain Management:    Induction:   PONV Risk Score and Plan:   Airway Management Planned:   Additional Equipment:   Intra-op Plan:   Post-operative Plan:   Informed Consent: I have reviewed the patients History and Physical, chart, labs and discussed the procedure including the risks, benefits and alternatives for the proposed anesthesia with the patient or authorized representative who has indicated his/her understanding and acceptance.     Dental Advisory Given  Plan Discussed with: CRNA  Anesthesia Plan Comments:         Anesthesia Quick Evaluation

## 2019-05-18 NOTE — H&P (Signed)

## 2019-05-18 NOTE — Op Note (Signed)
OPERATIVE NOTE  MARLOW GUARD [id number] 05/18/2019   PREOPERATIVE DIAGNOSIS:  Nuclear sclerotic cataract left eye. H25.12   POSTOPERATIVE DIAGNOSIS:    Nuclear sclerotic cataract left eye.     PROCEDURE:  Phacoemusification with posterior chamber intraocular lens placement of the left eye  Ultrasound time: Procedure(s) with comments: CATARACT EXTRACTION PHACO AND INTRAOCULAR LENS PLACEMENT (IOC) LEFT  1:21 11.0% 9.01 (Left) - sleep apnea  LENS:   Implant Name Type Inv. Item Serial No. Manufacturer Lot No. LRB No. Used Action  LENS IOL DIOP 20.0 - [id number] Intraocular Lens LENS IOL DIOP 20.0 [id number]  Left 1 Implanted      SURGEON:  Wyonia Hough, MD   ANESTHESIA:  Topical with tetracaine drops and 2% Xylocaine jelly, augmented with 1% preservative-free intracameral lidocaine.    COMPLICATIONS:  None.   DESCRIPTION OF PROCEDURE:  The patient was identified in the holding room and transported to the operating room and placed in the supine position under the operating microscope.  The left eye was identified as the operative eye and it was prepped and draped in the usual sterile ophthalmic fashion.   A 1 millimeter clear-corneal paracentesis was made at the 1:30 position.  0.5 ml of preservative-free 1% lidocaine was injected into the anterior chamber.  The anterior chamber was filled with Viscoat viscoelastic.  A 2.4 millimeter keratome was used to make a near-clear corneal incision at the 10:30 position.  .  A curvilinear capsulorrhexis was made with a cystotome and capsulorrhexis forceps.  Balanced salt solution was used to hydrodissect and hydrodelineate the nucleus.   Phacoemulsification was then used in stop and chop fashion to remove the lens nucleus and epinucleus.  The remaining cortex was then removed using the irrigation and aspiration handpiece. Provisc was then placed into the capsular bag to distend it for lens placement.  A lens was then injected into  the capsular bag.  The remaining viscoelastic was aspirated.   Wounds were hydrated with balanced salt solution.  The anterior chamber was inflated to a physiologic pressure with balanced salt solution.  No wound leaks were noted. Cefuroxime 0.1 ml of a 10mg /ml solution was injected into the anterior chamber for a dose of 1 mg of intracameral antibiotic at the completion of the case.   Timolol and Brimonidine drops were applied to the eye.  The patient was taken to the recovery room in stable condition without complications of anesthesia or surgery.  Tiandra Swoveland 05/18/2019, 9:11 AM

## 2019-05-18 NOTE — Transfer of Care (Signed)
Immediate Anesthesia Transfer of Care Note  Patient: Shane [name]  Procedure(s) Performed: CATARACT EXTRACTION PHACO AND INTRAOCULAR LENS PLACEMENT (IOC) LEFT  1:21 11.0% 9.01 (Left Eye)  Patient Location: PACU  Anesthesia Type: MAC  Level of Consciousness: awake, alert  and patient cooperative  Airway and Oxygen Therapy: Patient Spontanous Breathing and Patient connected to supplemental oxygen  Post-op Assessment: Post-op Vital signs reviewed, Patient's Cardiovascular Status Stable, Respiratory Function Stable, Patent Airway and No signs of Nausea or vomiting  Post-op Vital Signs: Reviewed and stable  Complications: No apparent anesthesia complications

## 2019-05-18 NOTE — Anesthesia Postprocedure Evaluation (Signed)
Anesthesia Post Note  Patient: Shane [name]  Procedure(s) Performed: CATARACT EXTRACTION PHACO AND INTRAOCULAR LENS PLACEMENT (IOC) LEFT  1:21 11.0% 9.01 (Left Eye)  Patient location during evaluation: PACU Anesthesia Type: MAC Level of consciousness: awake and alert Pain management: pain level controlled Vital Signs Assessment: post-procedure vital signs reviewed and stable Respiratory status: spontaneous breathing, nonlabored ventilation, respiratory function stable and patient connected to nasal cannula oxygen Cardiovascular status: stable and blood pressure returned to baseline Postop Assessment: no apparent nausea or vomiting Anesthetic complications: no    Alisa Graff

## 2019-05-18 NOTE — Anesthesia Procedure Notes (Signed)
Procedure Name: MAC Performed by: Izetta Dakin, CRNA Pre-anesthesia Checklist: Timeout performed, Patient being monitored, Suction available and Emergency Drugs available Patient Re-evaluated:Patient Re-evaluated prior to induction Oxygen Delivery Method: Nasal cannula

## 2019-05-19 ENCOUNTER — Encounter: Payer: Self-pay | Admitting: Ophthalmology

## 2019-06-02 ENCOUNTER — Encounter: Payer: Self-pay | Admitting: *Deleted

## 2019-06-02 ENCOUNTER — Other Ambulatory Visit: Payer: Self-pay

## 2019-06-03 ENCOUNTER — Other Ambulatory Visit
Admission: RE | Admit: 2019-06-03 | Discharge: 2019-06-03 | Disposition: A | Discharge status: Home / Self Care | Payer: BC Managed Care – PPO | Source: Ambulatory Visit | Attending: Ophthalmology | Admitting: Ophthalmology

## 2019-06-03 DIAGNOSIS — Z01812 Encounter for preprocedural laboratory examination: Secondary | ICD-10-CM | POA: Diagnosis not present

## 2019-06-03 DIAGNOSIS — Z20828 Contact with and (suspected) exposure to other viral communicable diseases: Secondary | ICD-10-CM | POA: Insufficient documentation

## 2019-06-03 LAB — SARS CORONAVIRUS 2 (TAT 6-24 HRS): SARS Coronavirus 2: NEGATIVE

## 2019-06-06 NOTE — Discharge Instructions (Signed)

## 2019-06-21 ENCOUNTER — Other Ambulatory Visit: Payer: Self-pay

## 2019-06-21 ENCOUNTER — Encounter: Payer: Self-pay | Admitting: *Deleted

## 2019-06-24 ENCOUNTER — Other Ambulatory Visit
Admission: RE | Admit: 2019-06-24 | Discharge: 2019-06-24 | Disposition: A | Discharge status: Home / Self Care | Payer: BC Managed Care – PPO | Source: Ambulatory Visit | Attending: Ophthalmology | Admitting: Ophthalmology

## 2019-06-24 DIAGNOSIS — Z20828 Contact with and (suspected) exposure to other viral communicable diseases: Secondary | ICD-10-CM | POA: Insufficient documentation

## 2019-06-24 DIAGNOSIS — Z01812 Encounter for preprocedural laboratory examination: Secondary | ICD-10-CM | POA: Insufficient documentation

## 2019-06-24 LAB — SARS CORONAVIRUS 2 (TAT 6-24 HRS): SARS Coronavirus 2: NEGATIVE

## 2019-06-29 ENCOUNTER — Ambulatory Visit: Payer: BC Managed Care – PPO | Admitting: Anesthesiology

## 2019-06-29 ENCOUNTER — Encounter
Admission: RE | Disposition: A | Discharge status: Home / Self Care | Payer: Self-pay | Source: Ambulatory Visit | Attending: Ophthalmology

## 2019-06-29 ENCOUNTER — Other Ambulatory Visit: Payer: Self-pay

## 2019-06-29 ENCOUNTER — Ambulatory Visit
Admission: RE | Admit: 2019-06-29 | Discharge: 2019-06-29 | Disposition: A | Discharge status: Home / Self Care | Payer: BC Managed Care – PPO | Source: Ambulatory Visit | Attending: Ophthalmology | Admitting: Ophthalmology

## 2019-06-29 DIAGNOSIS — Z7901 Long term (current) use of anticoagulants: Secondary | ICD-10-CM | POA: Diagnosis not present

## 2019-06-29 DIAGNOSIS — G4730 Sleep apnea, unspecified: Secondary | ICD-10-CM | POA: Insufficient documentation

## 2019-06-29 DIAGNOSIS — I509 Heart failure, unspecified: Secondary | ICD-10-CM | POA: Diagnosis not present

## 2019-06-29 DIAGNOSIS — I4891 Unspecified atrial fibrillation: Secondary | ICD-10-CM | POA: Insufficient documentation

## 2019-06-29 DIAGNOSIS — Z79899 Other long term (current) drug therapy: Secondary | ICD-10-CM | POA: Insufficient documentation

## 2019-06-29 DIAGNOSIS — Z950 Presence of cardiac pacemaker: Secondary | ICD-10-CM | POA: Insufficient documentation

## 2019-06-29 DIAGNOSIS — M109 Gout, unspecified: Secondary | ICD-10-CM | POA: Insufficient documentation

## 2019-06-29 DIAGNOSIS — Z87891 Personal history of nicotine dependence: Secondary | ICD-10-CM | POA: Insufficient documentation

## 2019-06-29 DIAGNOSIS — F419 Anxiety disorder, unspecified: Secondary | ICD-10-CM | POA: Insufficient documentation

## 2019-06-29 DIAGNOSIS — H2511 Age-related nuclear cataract, right eye: Secondary | ICD-10-CM | POA: Insufficient documentation

## 2019-06-29 HISTORY — PX: CATARACT EXTRACTION W/PHACO: SHX586

## 2019-06-29 SURGERY — PHACOEMULSIFICATION, CATARACT, WITH IOL INSERTION
Anesthesia: Monitor Anesthesia Care | Site: Eye | Laterality: Right

## 2019-06-29 MED ORDER — FENTANYL CITRATE (PF) 100 MCG/2ML IJ SOLN
INTRAMUSCULAR | Status: DC | PRN
  Administered 2019-06-29: 100 ug via INTRAVENOUS

## 2019-06-29 MED ORDER — NA HYALUR & NA CHOND-NA HYALUR 0.4-0.35 ML IO KIT
PACK | INTRAOCULAR | Status: DC | PRN
  Administered 2019-06-29: 1 mL via INTRAOCULAR

## 2019-06-29 MED ORDER — CEFUROXIME OPHTHALMIC INJECTION 1 MG/0.1 ML
INJECTION | OPHTHALMIC | Status: DC | PRN
  Administered 2019-06-29: 0.1 mL via INTRACAMERAL

## 2019-06-29 MED ORDER — ARMC OPHTHALMIC DILATING DROPS
1.0000 "application " | OPHTHALMIC | Status: DC | PRN
  Administered 2019-06-29 (×3): 1 via OPHTHALMIC

## 2019-06-29 MED ORDER — EPINEPHRINE PF 1 MG/ML IJ SOLN
INTRAOCULAR | Status: DC | PRN
  Administered 2019-06-29: 66 mL via OPHTHALMIC

## 2019-06-29 MED ORDER — MOXIFLOXACIN HCL 0.5 % OP SOLN
1.0000 [drp] | OPHTHALMIC | Status: DC | PRN
  Administered 2019-06-29 (×3): 1 [drp] via OPHTHALMIC

## 2019-06-29 MED ORDER — LIDOCAINE HCL (PF) 2 % IJ SOLN
INTRAOCULAR | Status: DC | PRN
  Administered 2019-06-29: 1 mL

## 2019-06-29 MED ORDER — BRIMONIDINE TARTRATE-TIMOLOL 0.2-0.5 % OP SOLN
OPHTHALMIC | Status: DC | PRN
  Administered 2019-06-29: 1 [drp] via OPHTHALMIC

## 2019-06-29 MED ORDER — ONDANSETRON HCL 4 MG/2ML IJ SOLN: 4.0000 mg | Freq: Once | INTRAMUSCULAR | Status: DC | PRN

## 2019-06-29 MED ORDER — MIDAZOLAM HCL 2 MG/2ML IJ SOLN
INTRAMUSCULAR | Status: DC | PRN
  Administered 2019-06-29: 2 mg via INTRAVENOUS

## 2019-06-29 MED ORDER — TETRACAINE HCL 0.5 % OP SOLN
1.0000 [drp] | OPHTHALMIC | Status: DC | PRN
  Administered 2019-06-29 (×3): 1 [drp] via OPHTHALMIC

## 2019-06-29 SURGICAL SUPPLY — 16 items
CANNULA ANT/CHMB 27G (MISCELLANEOUS) ×1 IMPLANT
CANNULA ANT/CHMB 27GA (MISCELLANEOUS) ×2 IMPLANT
GLOVE SURG LX 7.5 STRW (GLOVE) ×1
GLOVE SURG LX STRL 7.5 STRW (GLOVE) ×1 IMPLANT
GLOVE SURG TRIUMPH 8.0 PF LTX (GLOVE) ×2 IMPLANT
GOWN STRL REUS W/ TWL LRG LVL3 (GOWN DISPOSABLE) ×2 IMPLANT
GOWN STRL REUS W/TWL LRG LVL3 (GOWN DISPOSABLE) ×2
LENS IOL TECNIS ITEC 20.0 (Intraocular Lens) ×1 IMPLANT
MARKER SKIN DUAL TIP RULER LAB (MISCELLANEOUS) ×2 IMPLANT
PACK CATARACT BRASINGTON (MISCELLANEOUS) ×2 IMPLANT
PACK EYE AFTER SURG (MISCELLANEOUS) ×2 IMPLANT
PACK OPTHALMIC (MISCELLANEOUS) ×2 IMPLANT
SYR 3ML LL SCALE MARK (SYRINGE) ×2 IMPLANT
SYR TB 1ML LUER SLIP (SYRINGE) ×2 IMPLANT
WATER STERILE IRR 500ML POUR (IV SOLUTION) ×2 IMPLANT
WIPE NON LINTING 3.25X3.25 (MISCELLANEOUS) ×2 IMPLANT

## 2019-06-29 NOTE — H&P (Signed)

## 2019-06-29 NOTE — Transfer of Care (Signed)
Immediate Anesthesia Transfer of Care Note  Patient: Shane [name]  Procedure(s) Performed: CATARACT EXTRACTION PHACO AND INTRAOCULAR LENS PLACEMENT (IOC) RIGHT  01:14.2  14.5%  10.92 (Right Eye)  Patient Location: PACU  Anesthesia Type: MAC  Level of Consciousness: awake, alert  and patient cooperative  Airway and Oxygen Therapy: Patient Spontanous Breathing and Patient connected to supplemental oxygen  Post-op Assessment: Post-op Vital signs reviewed, Patient's Cardiovascular Status Stable, Respiratory Function Stable, Patent Airway and No signs of Nausea or vomiting  Post-op Vital Signs: Reviewed and stable  Complications: No apparent anesthesia complications

## 2019-06-29 NOTE — Anesthesia Postprocedure Evaluation (Signed)
Anesthesia Post Note  Patient: Shane [name]  Procedure(s) Performed: CATARACT EXTRACTION PHACO AND INTRAOCULAR LENS PLACEMENT (IOC) RIGHT  01:14.2  14.5%  10.92 (Right Eye)  Patient location during evaluation: PACU Anesthesia Type: MAC Level of consciousness: awake and alert Pain management: pain level controlled Vital Signs Assessment: post-procedure vital signs reviewed and stable Respiratory status: spontaneous breathing, nonlabored ventilation, respiratory function stable and patient connected to nasal cannula oxygen Cardiovascular status: stable and blood pressure returned to baseline Postop Assessment: no apparent nausea or vomiting Anesthetic complications: no    Sinda Du

## 2019-06-29 NOTE — Op Note (Signed)
LOCATION:  [place]   PREOPERATIVE DIAGNOSIS:    Nuclear sclerotic cataract right eye. H25.11   POSTOPERATIVE DIAGNOSIS:  Nuclear sclerotic cataract right eye.     PROCEDURE:  Phacoemusification with posterior chamber intraocular lens placement of the right eye   ULTRASOUND TIME: Procedure(s) with comments: CATARACT EXTRACTION PHACO AND INTRAOCULAR LENS PLACEMENT (IOC) RIGHT  01:14.2  14.5%  10.92 (Right) - sleep apnea  LENS:   Implant Name Type Inv. Item Serial No. Manufacturer Lot No. LRB No. Used Action  LENS IOL DIOP 20.0 - [id number] Intraocular Lens LENS IOL DIOP 20.0 [id number]  Right 1 Implanted         SURGEON:  Wyonia Hough, MD   ANESTHESIA:  Topical with tetracaine drops and 2% Xylocaine jelly, augmented with 1% preservative-free intracameral lidocaine.    COMPLICATIONS:  None.   DESCRIPTION OF PROCEDURE:  The patient was identified in the holding room and transported to the operating room and placed in the supine position under the operating microscope.  The right eye was identified as the operative eye and it was prepped and draped in the usual sterile ophthalmic fashion.   A 1 millimeter clear-corneal paracentesis was made at the 12:00 position.  0.5 ml of preservative-free 1% lidocaine was injected into the anterior chamber. The anterior chamber was filled with Viscoat viscoelastic.  A 2.4 millimeter keratome was used to make a near-clear corneal incision at the 9:00 position.  A curvilinear capsulorrhexis was made with a cystotome and capsulorrhexis forceps.  Balanced salt solution was used to hydrodissect and hydrodelineate the nucleus.   Phacoemulsification was then used in stop and chop fashion to remove the lens nucleus and epinucleus.  The remaining cortex was then removed using the irrigation and aspiration handpiece. Provisc was then placed into the capsular bag to distend it for lens placement.  A lens was then injected into the  capsular bag.  The remaining viscoelastic was aspirated.   Wounds were hydrated with balanced salt solution.  The anterior chamber was inflated to a physiologic pressure with balanced salt solution.  No wound leaks were noted. Cefuroxime 0.1 ml of a 10mg /ml solution was injected into the anterior chamber for a dose of 1 mg of intracameral antibiotic at the completion of the case.   Timolol and Brimonidine drops were applied to the eye.  The patient was taken to the recovery room in stable condition without complications of anesthesia or surgery.   Shane [name] 06/29/2019, 9:23 AM

## 2019-06-29 NOTE — Anesthesia Procedure Notes (Signed)
Procedure Name: MAC Performed by: Teonia Yager, CRNA Pre-anesthesia Checklist: Patient identified, Emergency Drugs available, Suction available, Timeout performed and Patient being monitored Patient Re-evaluated:Patient Re-evaluated prior to induction Oxygen Delivery Method: Nasal cannula Placement Confirmation: positive ETCO2       

## 2019-06-29 NOTE — Anesthesia Preprocedure Evaluation (Signed)
Anesthesia Evaluation  Patient identified by MRN, date of birth, ID band Patient awake    Reviewed: Allergy & Precautions, H&P , NPO status , Patient's Chart, lab work & pertinent test results, reviewed documented beta blocker date and time   History of Anesthesia Complications Negative for: history of anesthetic complications  Airway Mallampati: II  TM Distance: >3 FB Neck ROM: full    Dental no notable dental hx.    Pulmonary sleep apnea , former smoker,    Pulmonary exam normal breath sounds clear to auscultation       Cardiovascular Exercise Tolerance: Good +CHF  + dysrhythmias (s/p ablation) Atrial Fibrillation + pacemaker  Rhythm:regular Rate:Tachycardia  V-paced at 110   Neuro/Psych negative neurological ROS  negative psych ROS   GI/Hepatic negative GI ROS, Neg liver ROS,   Endo/Other  negative endocrine ROS  Renal/GU negative Renal ROS  negative genitourinary   Musculoskeletal  (+) Arthritis ,   Abdominal (+) + obese,   Peds  Hematology negative hematology ROS (+)   Anesthesia Other Findings   Reproductive/Obstetrics negative OB ROS                             Anesthesia Physical  Anesthesia Plan  ASA: III  Anesthesia Plan: MAC   Post-op Pain Management:    Induction: Intravenous  PONV Risk Score and Plan: 1 and Treatment may vary due to age or medical condition  Airway Management Planned:   Additional Equipment:   Intra-op Plan:   Post-operative Plan:   Informed Consent: I have reviewed the patients History and Physical, chart, labs and discussed the procedure including the risks, benefits and alternatives for the proposed anesthesia with the patient or authorized representative who has indicated his/her understanding and acceptance.     Dental Advisory Given  Plan Discussed with: CRNA and Anesthesiologist  Anesthesia Plan Comments:          Anesthesia Quick Evaluation  Patient Active Problem List   Diagnosis Date Noted  . Idiopathic chronic gout of multiple sites without tophus 06/29/2018  . History of rotator cuff tear 05/11/2018  . Primary osteoarthritis of right knee 05/11/2018  . Benign prostatic hyperplasia with urinary obstruction 11/13/2017  . 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 Elevated prostate specific antigen (PSA) 11/10/2017  . Atypical atrial flutter ([place]) 04/22/2017  . Status post catheter ablation of atrial fibrillation 04/04/2017  . AF (atrial fibrillation) ([place]) 04/03/2017  . Hypotension due to drugs 09/18/2015  . OSA (obstructive sleep apnea) 03/30/2014  . Acute on chronic diastolic CHF (congestive heart failure), NYHA class 2 ([place]) 03/30/2014  . Obstructive sleep apnea syndrome 03/30/2014  . Persistent atrial fibrillation ([place]) 04/05/2013  . Pacemaker for sinus node dysfunction 04/05/2013  . Chronic diastolic CHF (congestive heart failure) ([place]) 04/05/2013  . Obesity, morbid (more than 100 lbs over ideal weight or BMI > 40) ([facility]) 04/05/2013  . Chronic diastolic heart failure ([place]) 04/05/2013  . Morbid obesity ([place]) 04/05/2013  . Presence of cardiac pacemaker 04/05/2013  . Atrial fibrillation ([place]) 04/05/2013    CBC Latest Ref Rng & Units 03/31/2019 08/03/2018 06/29/2018  WBC 3.8 - 10.8 Thousand/uL 5.5 6.3 6.6  Hemoglobin 13.2 - 17.1 g/dL 15.6 14.7 14.8  Hematocrit 38.5 - 50.0 % 44.5 42.8 42.7  Platelets 140 - 400 Thousand/uL 248 264 264   BMP Latest Ref Rng & Units 03/31/2019 08/03/2018 06/29/2018  Glucose 65 - 99 mg/dL 138(H) 74 87  BUN 7 -  25 mg/dL _0 Creatinine 0.70 - 1.18 mg/dL 0.85 0.86 1.15  BUN/Creat Ratio 6 - 22 (calc) NOT APPLICABLE NOT APPLICABLE NOT APPLICABLE  Sodium 103 - 146 mmol/L 139 140 139  Potassium 3.5 - 5.3 mmol/L 4.0 4.2 4.0  Chloride 98 - 110 mmol/L 101 100 100  CO2 20 - 32 mmol/L 27 34(H) 35(H)  Calcium 8.6 - 10.3 mg/dL 9.8 9.8 9.6   Risks and benefits  of anesthesia discussed at length, patient or surrogate demonstrates understanding. Appropriately NPO. Plan to proceed with anesthesia.  Champ Mungo, MD 06/29/19

## 2019-06-30 ENCOUNTER — Other Ambulatory Visit: Payer: Self-pay | Admitting: Cardiology

## 2019-06-30 ENCOUNTER — Other Ambulatory Visit: Payer: Self-pay | Admitting: Rheumatology

## 2019-06-30 ENCOUNTER — Encounter: Payer: Self-pay | Admitting: Ophthalmology

## 2019-06-30 MED ORDER — DILTIAZEM HCL ER COATED BEADS 240 MG PO CP24: 240.0000 mg | ORAL_CAPSULE | Freq: Every day | ORAL | 11 refills | Status: DC

## 2019-06-30 MED ORDER — FUROSEMIDE 40 MG PO TABS: 40.0000 mg | ORAL_TABLET | Freq: Every day | ORAL | 11 refills | Status: DC

## 2019-06-30 NOTE — Addendum Note (Signed)
Addended by: Derl Barrow on: 06/30/2019 02:33 PM   Modules accepted: Orders

## 2019-06-30 NOTE — Telephone Encounter (Signed)
Last Visit: 03/31/19 Next visit: 09/30/19 Labs: 03/31/19 Uric acid is 6.1. Glucose is elevated-138. Rest of CMP WNL. CBC WNL.   Okay to refill per Dr. Estanislado Pandy

## 2019-09-01 ENCOUNTER — Other Ambulatory Visit: Payer: Self-pay | Admitting: *Deleted

## 2019-09-01 MED ORDER — TAMSULOSIN HCL 0.4 MG PO CAPS: 0.4000 mg | ORAL_CAPSULE | Freq: Every day | ORAL | 6 refills | Status: DC

## 2019-09-23 ENCOUNTER — Other Ambulatory Visit: Payer: Self-pay | Admitting: Rheumatology

## 2019-09-23 NOTE — Telephone Encounter (Signed)
Last Visit: 03/31/19 Next visit: 10/04/19 Labs: 03/31/19 Uric acid is 6.1. Glucose is elevated-138. Rest of CMP WNL. CBC WNL.   Okay to refill per Dr. Estanislado Pandy

## 2019-09-27 NOTE — Progress Notes (Signed)
Office Visit Note  Patient: Shane [name]             Date of Birth: July 09, 1946           MRN: [id number]             PCP: Albina Billet, MD Referring: Albina Billet, MD Visit Date: 10/04/2019 Occupation: @GUAROCC @  Subjective:  Gout flares.   History of Present Illness: Shane [name] is a 74 y.o. male with history of gouty arthropathy and osteoarthritis.  He states he has been having mild gout flares almost every other month.  His last flare was about 3 months ago in his left ankle joint.  He states at the time he was given some prednisone by Dr. Noemi Chapel.  He has had cortisone injections to his shoulders and knee joints by him off and on for osteoarthritis.  He has been taking allopurinol 200 mg a day.  He states he ran out of colchicine.  He has been watching his diet and mostly has been on vegetarian diet.  He does not consume alcohol.  Activities of Daily Living:  Patient reports morning stiffness for 15 minutes.   Patient Reports nocturnal pain.  Difficulty dressing/grooming: Denies Difficulty climbing stairs: Denies Difficulty getting out of chair: Reports Difficulty using hands for taps, buttons, cutlery, and/or writing: Denies  Review of Systems  Constitutional: Negative for fatigue and night sweats.  HENT: Negative for mouth sores, loss of taste, mouth dryness and nose dryness.   Eyes: Negative for redness and dryness.  Respiratory: Negative for shortness of breath, wheezing and difficulty breathing.   Cardiovascular: Negative for chest pain, palpitations, hypertension, irregular heartbeat and swelling in legs/feet.  Gastrointestinal: Negative for blood in stool, constipation and diarrhea.  Endocrine: Negative for increased urination.  Genitourinary: Negative for painful urination and involuntary urination.  Musculoskeletal: Positive for arthralgias, joint pain and morning stiffness. Negative for joint swelling, myalgias, muscle weakness, muscle tenderness and myalgias.   Skin: Negative for color change, rash, hair loss, nodules/bumps, skin tightness, ulcers and sensitivity to sunlight.  Allergic/Immunologic: Negative for susceptible to infections.  Neurological: Negative for dizziness, fainting, headaches, memory loss, night sweats and weakness.  Hematological: Negative for bruising/bleeding tendency and swollen glands.  Psychiatric/Behavioral: Negative for depressed mood, confusion and sleep disturbance. The patient is not nervous/anxious.     PMFS History:  Patient Active Problem List   Diagnosis Date Noted  . Idiopathic chronic gout of multiple sites without tophus 06/29/2018  . History of rotator cuff tear 05/11/2018  . Primary osteoarthritis of right knee 05/11/2018  . Benign prostatic hyperplasia with urinary obstruction 11/13/2017  . 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 Elevated prostate specific antigen (PSA) 11/10/2017  . Atypical atrial flutter ([place]) 04/22/2017  . Status post catheter ablation of atrial fibrillation 04/04/2017  . AF (atrial fibrillation) ([place]) 04/03/2017  . Hypotension due to drugs 09/18/2015  . OSA (obstructive sleep apnea) 03/30/2014  . Acute on chronic diastolic CHF (congestive heart failure), NYHA class 2 ([place]) 03/30/2014  . Obstructive sleep apnea syndrome 03/30/2014  . Persistent atrial fibrillation ([place]) 04/05/2013  . Pacemaker for sinus node dysfunction 04/05/2013  . Chronic diastolic CHF (congestive heart failure) ([place]) 04/05/2013  . Obesity, morbid (more than 100 lbs over ideal weight or BMI > 40) (HCC) 04/05/2013  . Chronic diastolic heart failure ([place]) 04/05/2013  . Morbid obesity ([place]) 04/05/2013  . Presence of cardiac pacemaker 04/05/2013  . Atrial fibrillation ([place]) 04/05/2013    Past Medical History:  Diagnosis Date  . Atrioventricular block   . Degenerative joint disease   . Gout   . Paroxysmal atrial fibrillation (HCC)   . Presence of permanent cardiac pacemaker 03/12/10   guidant  ([place])  . Sinus node dysfunction (HCC)   . Sleep apnea    CPAP  . Wears hearing aid in both ears     Family History  Problem Relation Age of Onset  . Stroke Mother   . Heart failure Mother   . Alzheimer's disease Father   . Heart disease Sister   . Dementia Sister    Past Surgical History:  Procedure Laterality Date  . ATRIAL FIBRILLATION ABLATION N/A 04/03/2017   Procedure: Atrial Fibrillation Ablation;  Surgeon: Constance Haw, MD;  Location: [facility];  Service: Cardiovascular;  Laterality: N/A;  . CARDIAC CATHETERIZATION  10/13/1994   No CAD  . CARDIAC CATHETERIZATION  05/10/2003   No CAD  . CARDIOVERSION N/A 10/21/2016   Procedure: CARDIOVERSION;  Surgeon: Sanda Klein, MD;  Location: [facility];  Service: Cardiovascular;  Laterality: N/A;  . CARDIOVERSION N/A 05/15/2017   Procedure: CARDIOVERSION;  Surgeon: Fay Records, MD;  Location: [facility] ENDOSCOPY;  Service: Cardiovascular;  Laterality: N/A;  . CATARACT EXTRACTION W/PHACO Left 05/18/2019   Procedure: CATARACT EXTRACTION PHACO AND INTRAOCULAR LENS PLACEMENT (IOC) LEFT  1:21 11.0% 9.01;  Surgeon: Leandrew Koyanagi, MD;  Location: [facility];  Service: Ophthalmology;  Laterality: Left;  sleep apnea  . CATARACT EXTRACTION W/PHACO Right 06/29/2019   Procedure: CATARACT EXTRACTION PHACO AND INTRAOCULAR LENS PLACEMENT (IOC) RIGHT  01:14.2  14.5%  10.92;  Surgeon: Leandrew Koyanagi, MD;  Location: [facility];  Service: Ophthalmology;  Laterality: Right;  sleep apnea  . CYST EXCISION  03/2019   on back   . NM MYOCAR PERF WALL MOTION  05/07/2011   Lexiscan: No ishcemia  . PERMANENT PACEMAKER INSERTION  03/12/2010   guidant  . ROTATOR CUFF REPAIR  1996  . SHOULDER ARTHROSCOPY    . US ECHOCARDIOGRAPHY  05/07/2011   mod. LVH,LA mod. dilated,borderline aortic root dilatation   Social History   Social History Narrative  . Not on file   Immunization History  Administered Date(s) Administered  .  Zoster Recombinat (Shingrix) 11/04/2017     Objective: Vital Signs: BP 95/61 (BP Location: Left Arm, Patient Position: Sitting, Cuff Size: Large)   Pulse 100   Resp 15   Ht 5\' 11"  (1.803 m)   Wt 274 lb (124.3 kg)   BMI 38.22 kg/m    Physical Exam Vitals and nursing note reviewed.  Constitutional:      Appearance: He is well-developed.  HENT:     Head: Normocephalic and atraumatic.  Eyes:     Conjunctiva/sclera: Conjunctivae normal.     Pupils: Pupils are equal, round, and reactive to light.  Cardiovascular:     Rate and Rhythm: Normal rate and regular rhythm.     Heart sounds: Normal heart sounds.     Comments: He has a pacemaker. Pulmonary:     Effort: Pulmonary effort is normal.     Breath sounds: Normal breath sounds.  Abdominal:     General: Bowel sounds are normal.     Palpations: Abdomen is soft.  Musculoskeletal:     Cervical back: Normal range of motion and neck supple.  Skin:    General: Skin is warm and dry.     Capillary Refill: Capillary refill takes less than 2 seconds.  Neurological:  Mental Status: He is alert and oriented to person, place, and time.  Psychiatric:        Behavior: Behavior normal.      Musculoskeletal Exam: C-spine limited range of motion with some discomfort.  Shoulder joints elbow joints wrist joint MCPs PIPs DIPs with good range of motion.  Hip joints knee joints ankles MTPs PIPs with good range of motion with no synovitis.  CDAI Exam: CDAI Score: -- Patient Global: --; Provider Global: -- Swollen: --; Tender: -- Joint Exam 10/04/2019   No joint exam has been documented for this visit   There is currently no information documented on the homunculus. Go to the Rheumatology activity and complete the homunculus joint exam.  Investigation: No additional findings.  Imaging: No results found.  Recent Labs: Lab Results  Component Value Date   WBC 5.5 03/31/2019   HGB 15.6 03/31/2019   PLT 248 03/31/2019   NA 139  03/31/2019   K 4.0 03/31/2019   CL 101 03/31/2019   CO2 27 03/31/2019   GLUCOSE 138 (H) 03/31/2019   BUN 15 03/31/2019   CREATININE 0.85 03/31/2019   BILITOT 0.8 03/31/2019   ALKPHOS 35 (L) 05/05/2017   AST 25 03/31/2019   ALT 23 03/31/2019   PROT 6.8 03/31/2019   ALBUMIN 3.8 05/05/2017   CALCIUM 9.8 03/31/2019   GFRAA 99 03/31/2019    Speciality Comments: No specialty comments available.  Procedures:  No procedures performed Allergies: Patient has no known allergies.   Assessment / Plan:     Visit Diagnoses: Idiopathic chronic gout of multiple sites without tophus -patient has been having infrequent gout flares.  He states he is having gout flare almost every other month.  He states he has been monitoring his diet but he has gained a lot of weight as he is not been be able to exercise.  He is now going to the gym due to the pandemic.  Regular exercise was encouraged.  I plan to increase the allopurinol dose to 300 mg p.o. daily.  I have also advised him to take colchicine 0.6 mg, 1/2 tablet daily for 2 weeks and then only on as needed basis.  As he has lot of allopurinol at home at this time I have advised him to take 3 tablets until he finishes the and then we will Call in [place] Prescription with Allopurinol 300 Mg Tablet.  Colchicine was refilled today.  Plan: Uric acid  Medication management - Allopurinol 200 mg by mouth daily and colchicine 0.3 mg by mouth daily PRN.  - Plan: CBC with Differential/Platelet, COMPLETE METABOLIC PANEL WITH GFR, CBC with Differential/Platelet, today and then he will get labs in 1 month which will include COMPLETE METABOLIC PANEL WITH GFR, Uric acid  History of rotator cuff tear - Right, he is followed by Dr. Mardelle Matte   Primary osteoarthritis of right knee-followed by orthopedics.  Chronic diastolic heart failure (HCC)-he is on Lasix and other medications.  Presence of cardiac pacemaker  Persistent atrial fibrillation (HCC)  OSA (obstructive sleep  apnea)  Morbid obesity (HCC)-BMI 38.22 weight loss diet and exercise was discussed at length.  Orders: Orders Placed This Encounter  Procedures  . CBC with Differential/Platelet  . COMPLETE METABOLIC PANEL WITH GFR  . Uric acid  . CBC with Differential/Platelet  . COMPLETE METABOLIC PANEL WITH GFR  . Uric acid   Meds ordered this encounter  Medications  . colchicine 0.6 MG tablet    Sig: Take 0.5 tablets (0.3 mg total)  by mouth daily as needed.    Dispense:  30 tablet    Refill:  2     Follow-Up Instructions: Return in about 3 months (around 01/01/2020) for Gout, Osteoarthritis.   Bo Merino, MD  Note - This record has been created using Editor, commissioning.  Chart creation errors have been sought, but may not always  have been located. Such creation errors do not reflect on  the standard of medical care.

## 2019-09-30 ENCOUNTER — Ambulatory Visit: Payer: BC Managed Care – PPO | Admitting: Rheumatology

## 2019-10-04 ENCOUNTER — Other Ambulatory Visit: Payer: Self-pay

## 2019-10-04 ENCOUNTER — Encounter: Payer: Self-pay | Admitting: Rheumatology

## 2019-10-04 ENCOUNTER — Encounter (INDEPENDENT_AMBULATORY_CARE_PROVIDER_SITE_OTHER): Payer: Self-pay

## 2019-10-04 ENCOUNTER — Ambulatory Visit: Payer: BC Managed Care – PPO | Admitting: Rheumatology

## 2019-10-04 VITALS — BP 95/61 | HR 100 | Resp 15 | Ht 71.0 in | Wt 274.0 lb

## 2019-10-04 DIAGNOSIS — Z8739 Personal history of other diseases of the musculoskeletal system and connective tissue: Secondary | ICD-10-CM

## 2019-10-04 DIAGNOSIS — M1711 Unilateral primary osteoarthritis, right knee: Secondary | ICD-10-CM

## 2019-10-04 DIAGNOSIS — Z79899 Other long term (current) drug therapy: Secondary | ICD-10-CM

## 2019-10-04 DIAGNOSIS — G4733 Obstructive sleep apnea (adult) (pediatric): Secondary | ICD-10-CM

## 2019-10-04 DIAGNOSIS — M1A09X0 Idiopathic chronic gout, multiple sites, without tophus (tophi): Secondary | ICD-10-CM

## 2019-10-04 DIAGNOSIS — I4819 Other persistent atrial fibrillation: Secondary | ICD-10-CM

## 2019-10-04 DIAGNOSIS — I5032 Chronic diastolic (congestive) heart failure: Secondary | ICD-10-CM

## 2019-10-04 DIAGNOSIS — Z950 Presence of cardiac pacemaker: Secondary | ICD-10-CM

## 2019-10-04 MED ORDER — COLCHICINE 0.6 MG PO TABS: 0.3000 mg | ORAL_TABLET | Freq: Every day | ORAL | 2 refills | Status: DC | PRN

## 2019-10-04 NOTE — Patient Instructions (Addendum)
Please take allopurinol 300 mg a day (100 mg tablet-3 tablets a day).  When you run out of allopurinol please call us for a new prescription which will be a 300 mg tablet and you will be taking 1 tablet a day  Take colchicine 0.6 mg tablet, 1/2 tablet daily for 2 weeks and then on as needed basis.  Return in 1 month for lab work.

## 2019-10-05 ENCOUNTER — Telehealth: Payer: Self-pay

## 2019-10-05 LAB — COMPLETE METABOLIC PANEL WITH GFR
AG Ratio: 1.6 (calc) (ref 1.0–2.5)
ALT: 18 U/L (ref 9–46)
AST: 20 U/L (ref 10–35)
Albumin: 4.2 g/dL (ref 3.6–5.1)
Alkaline phosphatase (APISO): 49 U/L (ref 35–144)
BUN: 17 mg/dL (ref 7–25)
CO2: 31 mmol/L (ref 20–32)
Calcium: 9.5 mg/dL (ref 8.6–10.3)
Chloride: 102 mmol/L (ref 98–110)
Creat: 0.99 mg/dL (ref 0.70–1.18)
GFR, Est African American: 86 mL/min/{1.73_m2} (ref >=60)
GFR, Est Non African American: 74 mL/min/{1.73_m2} (ref >=60)
Globulin: 2.6 g/dL (calc) (ref 1.9–3.7)
Glucose, Bld: 96 mg/dL (ref 65–99)
Potassium: 4 mmol/L (ref 3.5–5.3)
Sodium: 140 mmol/L (ref 135–146)
Total Bilirubin: 0.7 mg/dL (ref 0.2–1.2)
Total Protein: 6.8 g/dL (ref 6.1–8.1)

## 2019-10-05 LAB — CBC WITH DIFFERENTIAL/PLATELET
Absolute Monocytes: 434 cells/uL (ref 200–950)
Basophils Absolute: 63 cells/uL (ref 0–200)
Basophils Relative: 0.9 %
Eosinophils Absolute: 91 cells/uL (ref 15–500)
Eosinophils Relative: 1.3 %
HCT: 43.6 % (ref 38.5–50.0)
Hemoglobin: 14.9 g/dL (ref 13.2–17.1)
Lymphs Abs: 1834 cells/uL (ref 850–3900)
MCH: 32.3 pg (ref 27.0–33.0)
MCHC: 34.2 g/dL (ref 32.0–36.0)
MCV: 94.4 fL (ref 80.0–100.0)
MPV: 11.2 fL (ref 7.5–12.5)
Monocytes Relative: 6.2 %
Neutro Abs: 4578 cells/uL (ref 1500–7800)
Neutrophils Relative %: 65.4 %
Platelets: 278 10*3/uL (ref 140–400)
RBC: 4.62 10*6/uL (ref 4.20–5.80)
RDW: 12.8 % (ref 11.0–15.0)
Total Lymphocyte: 26.2 %
WBC: 7 10*3/uL (ref 3.8–10.8)

## 2019-10-05 LAB — URIC ACID: Uric Acid, Serum: 5.5 mg/dL (ref 4.0–8.0)

## 2019-10-05 MED ORDER — ALLOPURINOL 300 MG PO TABS: 300.0000 mg | ORAL_TABLET | Freq: Every day | ORAL | 2 refills | Status: AC

## 2019-10-05 NOTE — Telephone Encounter (Signed)
Received fax from [employer]. colchicine 0.6mg  tablets  approved from 10/04/2019-10/03/2020.   I advised patient and he verbalized understanding.

## 2019-10-05 NOTE — Telephone Encounter (Signed)
Submitted a prior authorization to Cover My Meds for colchicine tablets. Will update with response.   Pharmacy also requested refill of allopurinol. We will refill allopurinol 300mg  daily per office note.

## 2019-10-11 NOTE — Progress Notes (Addendum)
Cardiology Office Note Date:  10/14/2019  Patient [name], Shane [name] 1946-06-08, MRN [id number] PCP:  Albina Billet, MD  Cardiologist:  Dr. Sallyanne Kuster (2018) Electrophysiologist; Dr. Curt Bears    Chief Complaint:  6 mo f/u  History of Present Illness: Shane [name] is a 74 y.o. male with history of tachy-brady w/PPM, Persistent Afib, chronic CHF (diastolic), OSA w/CPAP, morbid obesity.  He comes in today to be seen for Dr. Curt Bears, last seen by him Sept 2020, at that time doing well, no changes were made  He comes today reports feeling "OK", but not surprised to hear he is in AFib today feeling unusually fatigued, reduced energy now for about a month.  No overt sensation of palpitations, no CP.   He has some degree of baseline DOE and this is unchanged. He has been more sedentary then usual the last year and put on some weight. He reports compliance with his Eliquis,no missed doses in the last 3 weeks, and even longer.  Says he is very good about this. He reports compliance with his CPAP but the mask has become uncomfortable. No dizzy spells, no near syncope or syncope.  He denies any bleeding or signs of bleeding  He has not followed with Dr. Sallyanne Kuster, since his AFib seemed to be his pressing cardiac issue, and has been following with Dr. Curt Bears alone.   Afib hx Noted as a diagnosis as far back as record goes, 2014 DCCV x 2 shocks failed PVI ablation 04/03/2017, Dr. Curt Bears  AAD hx None to date  Device information BSCi dual chamber PPM, implanted 06/25/2003, gen change 2011   Past Medical History:  Diagnosis Date  . Atrioventricular block   . Degenerative joint disease   . Gout   . Paroxysmal atrial fibrillation (HCC)   . Presence of permanent cardiac pacemaker 03/12/10   guidant  ([place])  . Sinus node dysfunction (HCC)   . Sleep apnea    CPAP  . Wears hearing aid in both ears     Past Surgical History:  Procedure Laterality Date  . ATRIAL  FIBRILLATION ABLATION N/A 04/03/2017   Procedure: Atrial Fibrillation Ablation;  Surgeon: Constance Haw, MD;  Location: [facility];  Service: Cardiovascular;  Laterality: N/A;  . CARDIAC CATHETERIZATION  10/13/1994   No CAD  . CARDIAC CATHETERIZATION  05/10/2003   No CAD  . CARDIOVERSION N/A 10/21/2016   Procedure: CARDIOVERSION;  Surgeon: Sanda Klein, MD;  Location: [facility] ENDOSCOPY;  Service: Cardiovascular;  Laterality: N/A;  . CARDIOVERSION N/A 05/15/2017   Procedure: CARDIOVERSION;  Surgeon: Fay Records, MD;  Location: [facility] ENDOSCOPY;  Service: Cardiovascular;  Laterality: N/A;  . CATARACT EXTRACTION W/PHACO Left 05/18/2019   Procedure: CATARACT EXTRACTION PHACO AND INTRAOCULAR LENS PLACEMENT (IOC) LEFT  1:21 11.0% 9.01;  Surgeon: Leandrew Koyanagi, MD;  Location: [facility];  Service: Ophthalmology;  Laterality: Left;  sleep apnea  . CATARACT EXTRACTION W/PHACO Right 06/29/2019   Procedure: CATARACT EXTRACTION PHACO AND INTRAOCULAR LENS PLACEMENT (IOC) RIGHT  01:14.2  14.5%  10.92;  Surgeon: Leandrew Koyanagi, MD;  Location: [facility];  Service: Ophthalmology;  Laterality: Right;  sleep apnea  . CYST EXCISION  03/2019   on back   . NM MYOCAR PERF WALL MOTION  05/07/2011   Lexiscan: No ishcemia  . PERMANENT PACEMAKER INSERTION  03/12/2010   guidant  . ROTATOR CUFF REPAIR  1996  . SHOULDER ARTHROSCOPY    . US ECHOCARDIOGRAPHY  05/07/2011  mod. LVH,LA mod. dilated,borderline aortic root dilatation    Current Outpatient Medications  Medication Sig Dispense Refill  . allopurinol (ZYLOPRIM) 300 MG tablet Take 1 tablet (300 mg total) by mouth daily. 30 tablet 2  . anastrozole (ARIMIDEX) 1 MG tablet Take 1 tablet by mouth daily.    Marland Kitchen apixaban (ELIQUIS) 5 MG TABS tablet Take 1 tablet (5 mg total) by mouth 2 (two) times daily. 60 tablet 11  . Ascorbic Acid (VITAMIN C) 1000 MG tablet Take 1,000 mg by mouth daily.    . Calcium Citrate (CITRACAL PO) Take by mouth  daily. Citracal Tablets as directed daily    . Cholecalciferol (VITAMIN D-3) 1000 UNITS CAPS Take 1 capsule by mouth daily.    . citalopram (CELEXA) 40 MG tablet Take 1 tablet (40 mg total) by mouth daily. MUST RECEIVE FUTURE REFILLS FROM PCP. 30 tablet 0  . co-enzyme Q-10 50 MG capsule Take by mouth.    . colchicine 0.6 MG tablet Take 0.5 tablets (0.3 mg total) by mouth daily as needed. 30 tablet 2  . diltiazem (CARDIZEM CD) 240 MG 24 hr capsule Take 1 capsule (240 mg total) by mouth daily. 30 capsule 11  . furosemide (LASIX) 40 MG tablet Take 1 tablet (40 mg total) by mouth daily. 30 tablet 11  . Misc Natural Products (TART CHERRY ADVANCED PO) Take by mouth daily.    . montelukast (SINGULAIR) 10 MG tablet Take 10 mg by mouth at bedtime.    . Multiple Vitamins-Minerals (ZINC PO) Take 50 mg by mouth daily.    . Omega-3 Fatty Acids (FISH OIL) 1200 MG CAPS Take 1,200 mg by mouth 2 (two) times daily.     Marland Kitchen pyridOXINE (VITAMIN B-6) 100 MG tablet Take 100 mg by mouth daily.    . tamsulosin (FLOMAX) 0.4 MG CAPS capsule Take 1 capsule (0.4 mg total) by mouth daily. 30 capsule 6  . Testosterone 1.62 % GEL 3 Pump daily.  1  . vitamin B-12 (CYANOCOBALAMIN) 1000 MCG tablet Take 1,000 mcg by mouth daily.    Marland Kitchen zinc gluconate 50 MG tablet Take 50 mg by mouth daily.     No current facility-administered medications for this visit.    Allergies:   Patient has no known allergies.   Social History:  The patient  reports that he quit smoking about 26 years ago. His smoking use included cigars. He quit smokeless tobacco use about 26 years ago.  His smokeless tobacco use included chew. He reports current alcohol use. He reports that he does not use drugs.   Family History:  The patient's family history includes Alzheimer's disease in his father; Dementia in his sister; Heart disease in his sister; Heart failure in his mother; Stroke in his mother.  ROS:  Please see the history of present illness.  All other  systems are reviewed and otherwise negative.   PHYSICAL EXAM:  VS:  BP 122/80   Pulse 93   Ht 5\' 11"  (1.803 m)   Wt 271 lb (122.9 kg)   SpO2 96%   BMI 37.80 kg/m  BMI: Body mass index is 37.8 kg/m. Well nourished, well developed, in no acute distress  HEENT: normocephalic, atraumatic  Neck: no JVD, carotid bruits or masses Cardiac:  irreg-irreg; no significant murmurs, no rubs, or gallops Lungs:  CTA b/l, no wheezing, rhonchi or rales  Abd: soft, nontender, obese MS: no deformity or atrophy Ext: trace edema  Skin: warm and dry, no rash Neuro:  No gross  deficits appreciated Psych: euthymic mood, full affect  PPM site is stable, no tethering or discomfort   EKG:  Done today and reviewed by myself: AFib, 83bpm, LBBB, a few V paced beats  PPM interrogation done today and reviewed by myself:  Battery estimate is 1.5years Lead measurements are good AMS 79%     04/03/2017: EPS/ablation CONCLUSIONS: 1. Atrial fibrillation upon presentation.   2. Successful electrical isolation and anatomical encircling of all four pulmonary veins with radiofrequency current.  A WACA approach was used 3. Additional left atrial ablation was performed with a LA roof line created along the posterior wall of the left atrium 4. Atrial fibrillation successfully cardioverted to sinus rhythm. 5. No early apparent complications.   TTE 11/06/16  Review of the above records today demonstrates:  - Left ventricle: Septal hypokinesis. The cavity size was moderately dilated. Wall thickness was increased in a pattern of mild LVH. Systolic function was normal. The estimated ejection fraction was in the range of 50% to 55%. - Aortic valve: There was trivial regurgitation. - Left atrium: The atrium was mildly dilated.   Recent Labs: 10/04/2019: ALT 18; BUN 17; Creat 0.99; Hemoglobin 14.9; Platelets 278; Potassium 4.0; Sodium 140  No results found for requested labs within last 8760 hours.   Estimated  Creatinine Clearance: 86 mL/min (by C-G formula based on SCr of 0.99 mg/dL).   Wt Readings from Last 3 Encounters:  10/14/19 271 lb (122.9 kg)  10/04/19 274 lb (124.3 kg)  06/29/19 263 lb (119.3 kg)     Other studies reviewed: Additional studies/records reviewed today include: summarized above  ASSESSMENT AND PLAN:  1. Persistent Afib     CHA2DS2Vasc is 2, on Eliquis, appropriately dosed     S/p PVI ablation 2018      Burden is up significantly Duration of current event [place] unclear, he has felt more fatigued for about a month He denies any missed doses of eliquis  Recommend DCCV and close f/u with the AFib clinic He may need AAD Consideration for repeat ablation perhaps  Discussed DCCV procedure, potential benefits and risks, he is agreeable  2. PPM     Intact function     Follow remotes and in clinic in 94mo, in d/w industry rep, feels age of device, est battery longevity more along a year  3. Chronic CHF (diastolic)      No symptoms or exam findings to suggest avert volume OL  4. OSA     Reports compliance with his CPAP though mask has become uncomfortable and requests a new machine/mask     He reports Dr. Claiborne Billings was original OSA MD, I will ask MA to send to his RN, pt request for new machine/mask    Disposition:  As above  Current medicines are reviewed at length with the patient today.  The patient did not have any concerns regarding medicines.  Venetia Night, PA-C 10/14/2019 4:57 PM     [address] [place] [address] [phone] (office)  [phone] (fax)

## 2019-10-11 NOTE — H&P (View-Only) (Signed)
Cardiology Office Note Date:  10/14/2019  Patient [name] [birth year], MRN [id number] PCP:  Albina Billet, MD  Cardiologist:  Dr. Sallyanne Kuster (2018) Electrophysiologist; Dr. Curt Bears    Chief Complaint:  6 mo f/u  History of Present Illness: Shane [name] is a 74 y.o. male with history of tachy-brady w/PPM, Persistent Afib, chronic CHF (diastolic), OSA w/CPAP, morbid obesity.  He comes in today to be seen for Dr. Curt Bears, last seen by him Sept 2020, at that time doing well, no changes were made  He comes today reports feeling "OK", but not surprised to hear he is in AFib today feeling unusually fatigued, reduced energy now for about a month.  No overt sensation of palpitations, no CP.   He has some degree of baseline DOE and this is unchanged. He has been more sedentary then usual the last year and put on some weight. He reports compliance with his Eliquis,no missed doses in the last 3 weeks, and even longer.  Says he is very good about this. He reports compliance with his CPAP but the mask has become uncomfortable. No dizzy spells, no near syncope or syncope.  He denies any bleeding or signs of bleeding  He has not followed with Dr. Sallyanne Kuster, since his AFib seemed to be his pressing cardiac issue, and has been following with Dr. Curt Bears alone.   Afib hx Noted as a diagnosis as far back as record goes, 2014 DCCV x 2 shocks failed PVI ablation 04/03/2017, Dr. Curt Bears  AAD hx None to date  Device information BSCi dual chamber PPM, implanted 06/25/2003, gen change 2011   Past Medical History:  Diagnosis Date  . Atrioventricular block   . Degenerative joint disease   . Gout   . Paroxysmal atrial fibrillation (HCC)   . Presence of permanent cardiac pacemaker 03/12/10   guidant  ([place])  . Sinus node dysfunction (HCC)   . Sleep apnea    CPAP  . Wears hearing aid in both ears     Past Surgical History:  Procedure Laterality Date  . ATRIAL  FIBRILLATION ABLATION N/A 04/03/2017   Procedure: Atrial Fibrillation Ablation;  Surgeon: Constance Haw, MD;  Location: [facility];  Service: Cardiovascular;  Laterality: N/A;  . CARDIAC CATHETERIZATION  10/13/1994   No CAD  . CARDIAC CATHETERIZATION  05/10/2003   No CAD  . CARDIOVERSION N/A 10/21/2016   Procedure: CARDIOVERSION;  Surgeon: Sanda Klein, MD;  Location: [facility];  Service: Cardiovascular;  Laterality: N/A;  . CARDIOVERSION N/A 05/15/2017   Procedure: CARDIOVERSION;  Surgeon: Fay Records, MD;  Location: [facility];  Service: Cardiovascular;  Laterality: N/A;  . CATARACT EXTRACTION W/PHACO Left 05/18/2019   Procedure: CATARACT EXTRACTION PHACO AND INTRAOCULAR LENS PLACEMENT (IOC) LEFT  1:21 11.0% 9.01;  Surgeon: Leandrew Koyanagi, MD;  Location: [facility];  Service: Ophthalmology;  Laterality: Left;  sleep apnea  . CATARACT EXTRACTION W/PHACO Right 06/29/2019   Procedure: CATARACT EXTRACTION PHACO AND INTRAOCULAR LENS PLACEMENT (IOC) RIGHT  01:14.2  14.5%  10.92;  Surgeon: Leandrew Koyanagi, MD;  Location: [facility];  Service: Ophthalmology;  Laterality: Right;  sleep apnea  . CYST EXCISION  03/2019   on back   . NM MYOCAR PERF WALL MOTION  05/07/2011   Lexiscan: No ishcemia  . PERMANENT PACEMAKER INSERTION  03/12/2010   guidant  . ROTATOR CUFF REPAIR  1996  . SHOULDER ARTHROSCOPY    . US ECHOCARDIOGRAPHY  05/07/2011  mod. LVH,LA mod. dilated,borderline aortic root dilatation    Current Outpatient Medications  Medication Sig Dispense Refill  . allopurinol (ZYLOPRIM) 300 MG tablet Take 1 tablet (300 mg total) by mouth daily. 30 tablet 2  . anastrozole (ARIMIDEX) 1 MG tablet Take 1 tablet by mouth daily.    Marland Kitchen apixaban (ELIQUIS) 5 MG TABS tablet Take 1 tablet (5 mg total) by mouth 2 (two) times daily. 60 tablet 11  . Ascorbic Acid (VITAMIN C) 1000 MG tablet Take 1,000 mg by mouth daily.    . Calcium Citrate (CITRACAL PO) Take by mouth  daily. Citracal Tablets as directed daily    . Cholecalciferol (VITAMIN D-3) 1000 UNITS CAPS Take 1 capsule by mouth daily.    . citalopram (CELEXA) 40 MG tablet Take 1 tablet (40 mg total) by mouth daily. MUST RECEIVE FUTURE REFILLS FROM PCP. 30 tablet 0  . co-enzyme Q-10 50 MG capsule Take by mouth.    . colchicine 0.6 MG tablet Take 0.5 tablets (0.3 mg total) by mouth daily as needed. 30 tablet 2  . diltiazem (CARDIZEM CD) 240 MG 24 hr capsule Take 1 capsule (240 mg total) by mouth daily. 30 capsule 11  . furosemide (LASIX) 40 MG tablet Take 1 tablet (40 mg total) by mouth daily. 30 tablet 11  . Misc Natural Products (TART CHERRY ADVANCED PO) Take by mouth daily.    . montelukast (SINGULAIR) 10 MG tablet Take 10 mg by mouth at bedtime.    . Multiple Vitamins-Minerals (ZINC PO) Take 50 mg by mouth daily.    . Omega-3 Fatty Acids (FISH OIL) 1200 MG CAPS Take 1,200 mg by mouth 2 (two) times daily.     Marland Kitchen pyridOXINE (VITAMIN B-6) 100 MG tablet Take 100 mg by mouth daily.    . tamsulosin (FLOMAX) 0.4 MG CAPS capsule Take 1 capsule (0.4 mg total) by mouth daily. 30 capsule 6  . Testosterone 1.62 % GEL 3 Pump daily.  1  . vitamin B-12 (CYANOCOBALAMIN) 1000 MCG tablet Take 1,000 mcg by mouth daily.    Marland Kitchen zinc gluconate 50 MG tablet Take 50 mg by mouth daily.     No current facility-administered medications for this visit.    Allergies:   Patient has no known allergies.   Social History:  The patient  reports that he quit smoking about 26 years ago. His smoking use included cigars. He quit smokeless tobacco use about 26 years ago.  His smokeless tobacco use included chew. He reports current alcohol use. He reports that he does not use drugs.   Family History:  The patient's family history includes Alzheimer's disease in his father; Dementia in his sister; Heart disease in his sister; Heart failure in his mother; Stroke in his mother.  ROS:  Please see the history of present illness.  All other  systems are reviewed and otherwise negative.   PHYSICAL EXAM:  VS:  BP 122/80   Pulse 93   Ht 5\' 11"  (1.803 m)   Wt 271 lb (122.9 kg)   SpO2 96%   BMI 37.80 kg/m  BMI: Body mass index is 37.8 kg/m. Well nourished, well developed, in no acute distress  HEENT: normocephalic, atraumatic  Neck: no JVD, carotid bruits or masses Cardiac:  irreg-irreg; no significant murmurs, no rubs, or gallops Lungs:  CTA b/l, no wheezing, rhonchi or rales  Abd: soft, nontender, obese MS: no deformity or atrophy Ext: trace edema  Skin: warm and dry, no rash Neuro:  No gross  deficits appreciated Psych: euthymic mood, full affect  PPM site is stable, no tethering or discomfort   EKG:  Done today and reviewed by myself: AFib, 83bpm, LBBB, a few V paced beats  PPM interrogation done today and reviewed by myself:  Battery estimate is 1.5years Lead measurements are good AMS 79%     04/03/2017: EPS/ablation CONCLUSIONS: 1. Atrial fibrillation upon presentation.   2. Successful electrical isolation and anatomical encircling of all four pulmonary veins with radiofrequency current.  A WACA approach was used 3. Additional left atrial ablation was performed with a LA roof line created along the posterior wall of the left atrium 4. Atrial fibrillation successfully cardioverted to sinus rhythm. 5. No early apparent complications.   TTE 11/06/16  Review of the above records today demonstrates:  - Left ventricle: Septal hypokinesis. The cavity size was moderately dilated. Wall thickness was increased in a pattern of mild LVH. Systolic function was normal. The estimated ejection fraction was in the range of 50% to 55%. - Aortic valve: There was trivial regurgitation. - Left atrium: The atrium was mildly dilated.   Recent Labs: 10/04/2019: ALT 18; BUN 17; Creat 0.99; Hemoglobin 14.9; Platelets 278; Potassium 4.0; Sodium 140  No results found for requested labs within last 8760 hours.   Estimated  Creatinine Clearance: 86 mL/min (by C-G formula based on SCr of 0.99 mg/dL).   Wt Readings from Last 3 Encounters:  10/14/19 271 lb (122.9 kg)  10/04/19 274 lb (124.3 kg)  06/29/19 263 lb (119.3 kg)     Other studies reviewed: Additional studies/records reviewed today include: summarized above  ASSESSMENT AND PLAN:  1. Persistent Afib     CHA2DS2Vasc is 2, on Eliquis, appropriately dosed     S/p PVI ablation 2018      Burden is up significantly Duration of current event [place] unclear, he has felt more fatigued for about a month He denies any missed doses of eliquis  Recommend DCCV and close f/u with the AFib clinic He may need AAD Consideration for repeat ablation perhaps  Discussed DCCV procedure, potential benefits and risks, he is agreeable  2. PPM     Intact function     Follow remotes and in clinic in 28mo, in d/w industry rep, feels age of device, est battery longevity more along a year  3. Chronic CHF (diastolic)      No symptoms or exam findings to suggest avert volume OL  4. OSA     Reports compliance with his CPAP though mask has become uncomfortable and requests a new machine/mask     He reports Dr. Claiborne Billings was original OSA MD, I will ask MA to send to his RN, pt request for new machine/mask    Disposition:  As above  Current medicines are reviewed at length with the patient today.  The patient did not have any concerns regarding medicines.  Venetia Night, PA-C 10/14/2019 4:57 PM     [address] [place] [address] [phone] (office)  [phone] (fax)

## 2019-10-14 ENCOUNTER — Other Ambulatory Visit: Payer: Self-pay

## 2019-10-14 ENCOUNTER — Ambulatory Visit: Payer: BC Managed Care – PPO | Admitting: Physician Assistant

## 2019-10-14 ENCOUNTER — Encounter: Payer: Self-pay | Admitting: *Deleted

## 2019-10-14 VITALS — BP 122/80 | HR 93 | Ht 71.0 in | Wt 271.0 lb

## 2019-10-14 DIAGNOSIS — Z950 Presence of cardiac pacemaker: Secondary | ICD-10-CM | POA: Diagnosis not present

## 2019-10-14 DIAGNOSIS — I5032 Chronic diastolic (congestive) heart failure: Secondary | ICD-10-CM | POA: Diagnosis not present

## 2019-10-14 DIAGNOSIS — G4733 Obstructive sleep apnea (adult) (pediatric): Secondary | ICD-10-CM | POA: Diagnosis not present

## 2019-10-14 DIAGNOSIS — I4819 Other persistent atrial fibrillation: Secondary | ICD-10-CM

## 2019-10-14 DIAGNOSIS — I4891 Unspecified atrial fibrillation: Secondary | ICD-10-CM | POA: Diagnosis not present

## 2019-10-14 NOTE — Patient Instructions (Addendum)
Medication Instructions:   Your physician recommends that you continue on your current medications as directed. Please refer to the Current Medication list given to you today.  *If you need a refill on your cardiac medications before your next appointment, please call your pharmacy*  Lab Work: [place]   If you have labs (blood work) drawn today and your tests are completely normal, you will receive your results only by: Marland Kitchen MyChart Message (if you have MyChart) OR . A paper copy in the mail If you have any lab test that is abnormal or we need to change your treatment, we will call you to review the results.  Testing/Procedures: SEE LETTER ATTACHED  FOR 10-21-19 Your physician has recommended that you have a Cardioversion (DCCV). Electrical Cardioversion uses a jolt of electricity to your heart either through paddles or wired patches attached to your chest. This is a controlled, usually prescheduled, procedure. Defibrillation is done under light anesthesia in the hospital, and you usually go home the day of the procedure. This is done to get your heart back into a normal rhythm. You are not awake for the procedure. Please see the instruction sheet given to you today.   Follow-Up: At [facility], you and your health needs are our priority.  As part of our continuing mission to provide you with exceptional heart care, we have created designated Provider Care Teams.  These Care Teams include your primary Cardiologist (physician) and Advanced Practice Providers (APPs -  Physician Assistants and Nurse Practitioners) who all work together to provide you with the care you need, when you need it.  Your next appointment:   1  month(s) AFTER  10-21-19   The format for your next appointment:   In Person  Provider:   AFIB CLINIC     AND 3 MONTHS WITH DR Curt Bears   Other Instructions

## 2019-10-17 ENCOUNTER — Other Ambulatory Visit (HOSPITAL_COMMUNITY)
Admission: RE | Admit: 2019-10-17 | Discharge: 2019-10-17 | Disposition: A | Discharge status: Home / Self Care | Payer: BC Managed Care – PPO | Source: Ambulatory Visit | Attending: Surgery | Admitting: Surgery

## 2019-10-17 DIAGNOSIS — Z01812 Encounter for preprocedural laboratory examination: Secondary | ICD-10-CM | POA: Diagnosis not present

## 2019-10-17 DIAGNOSIS — Z20822 Contact with and (suspected) exposure to covid-19: Secondary | ICD-10-CM | POA: Diagnosis not present

## 2019-10-17 LAB — SARS CORONAVIRUS 2 (TAT 6-24 HRS): SARS Coronavirus 2: NEGATIVE

## 2019-10-18 ENCOUNTER — Telehealth: Payer: Self-pay | Admitting: *Deleted

## 2019-10-18 NOTE — Telephone Encounter (Signed)
-----   Message from Claude Manges, [place] sent at 10/17/2019  8:47 AM EST ----- Regarding: Sleep  ----- Message ----- From: Baldwin [place], PA-C Sent: 10/14/2019   6:25 PM EST To: Claude Manges, CMA  He requested that he get a new CPAP machine and mask.  Told me Dr. Claiborne Billings was his OSA MD and prescribed it originally  Gave me the name/number to order  [facility]) [phone]  Can you find out who takes care of this for Dr. Claiborne Billings and forward his request and the information please?  THANKS!

## 2019-10-18 NOTE — Telephone Encounter (Signed)
Order for patient to get a replacement CPAP machine with supplies sent to [facility]. Patient notified.

## 2019-10-21 ENCOUNTER — Ambulatory Visit (HOSPITAL_COMMUNITY)
Admission: RE | Admit: 2019-10-21 | Discharge: 2019-10-21 | Disposition: A | Discharge status: Home / Self Care | Payer: BC Managed Care – PPO | Source: Ambulatory Visit | Attending: Cardiovascular Disease | Admitting: Cardiovascular Disease

## 2019-10-21 ENCOUNTER — Ambulatory Visit (HOSPITAL_COMMUNITY): Payer: BC Managed Care – PPO | Admitting: Certified Registered"

## 2019-10-21 ENCOUNTER — Encounter (HOSPITAL_COMMUNITY)
Admission: RE | Disposition: A | Discharge status: Home / Self Care | Payer: Self-pay | Source: Ambulatory Visit | Attending: Cardiovascular Disease

## 2019-10-21 ENCOUNTER — Other Ambulatory Visit: Payer: Self-pay

## 2019-10-21 DIAGNOSIS — Z87891 Personal history of nicotine dependence: Secondary | ICD-10-CM | POA: Diagnosis not present

## 2019-10-21 DIAGNOSIS — Z6837 Body mass index (BMI) 37.0-37.9, adult: Secondary | ICD-10-CM | POA: Diagnosis not present

## 2019-10-21 DIAGNOSIS — I495 Sick sinus syndrome: Secondary | ICD-10-CM

## 2019-10-21 DIAGNOSIS — I5032 Chronic diastolic (congestive) heart failure: Secondary | ICD-10-CM | POA: Insufficient documentation

## 2019-10-21 DIAGNOSIS — Z7901 Long term (current) use of anticoagulants: Secondary | ICD-10-CM | POA: Insufficient documentation

## 2019-10-21 DIAGNOSIS — G4733 Obstructive sleep apnea (adult) (pediatric): Secondary | ICD-10-CM | POA: Insufficient documentation

## 2019-10-21 DIAGNOSIS — Z950 Presence of cardiac pacemaker: Secondary | ICD-10-CM | POA: Insufficient documentation

## 2019-10-21 DIAGNOSIS — I4819 Other persistent atrial fibrillation: Secondary | ICD-10-CM | POA: Diagnosis not present

## 2019-10-21 DIAGNOSIS — Z79899 Other long term (current) drug therapy: Secondary | ICD-10-CM | POA: Diagnosis not present

## 2019-10-21 DIAGNOSIS — I480 Paroxysmal atrial fibrillation: Secondary | ICD-10-CM | POA: Diagnosis not present

## 2019-10-21 DIAGNOSIS — M109 Gout, unspecified: Secondary | ICD-10-CM | POA: Insufficient documentation

## 2019-10-21 DIAGNOSIS — I4430 Unspecified atrioventricular block: Secondary | ICD-10-CM | POA: Diagnosis not present

## 2019-10-21 HISTORY — PX: CARDIOVERSION: SHX1299

## 2019-10-21 LAB — POCT I-STAT, CHEM 8
BUN: 14 mg/dL (ref 8–23)
Calcium, Ion: 1.1 mmol/L — ABNORMAL LOW (ref 1.15–1.40)
Chloride: 102 mmol/L (ref 98–111)
Creatinine, Ser: 0.9 mg/dL (ref 0.61–1.24)
Glucose, Bld: 97 mg/dL (ref 70–99)
HCT: 46 % (ref 39.0–52.0)
Hemoglobin: 15.6 g/dL (ref 13.0–17.0)
Potassium: 3.8 mmol/L (ref 3.5–5.1)
Sodium: 139 mmol/L (ref 135–145)
TCO2: 28 mmol/L (ref 22–32)

## 2019-10-21 SURGERY — CARDIOVERSION
Anesthesia: General

## 2019-10-21 MED ORDER — PROPOFOL 10 MG/ML IV BOLUS
INTRAVENOUS | Status: DC | PRN
  Administered 2019-10-21: 100 mg via INTRAVENOUS
  Administered 2019-10-21: 30 mg via INTRAVENOUS

## 2019-10-21 MED ORDER — SODIUM CHLORIDE 0.9 % IV SOLN
INTRAVENOUS | Status: AC | PRN
  Administered 2019-10-21: 500 mL via INTRAVENOUS

## 2019-10-21 MED ORDER — LIDOCAINE 2% (20 MG/ML) 5 ML SYRINGE
INTRAMUSCULAR | Status: DC | PRN
  Administered 2019-10-21: 40 mg via INTRAVENOUS

## 2019-10-21 NOTE — Op Note (Addendum)
Procedure: Electrical Cardioversion Indications:  Atrial Fibrillation  Procedure Details:  Consent: Risks of procedure as well as the alternatives and risks of each were explained to the (patient/caregiver).  Consent for procedure obtained.  Time Out: Verified patient identification, verified procedure, site/side was marked, verified correct patient position, special equipment/implants available, medications/allergies/relevent history reviewed, required imaging and test results available.  Performed  Patient placed on cardiac monitor, pulse oximetry, supplemental oxygen as necessary.  Sedation given: Propofol 130 mg IV, Dr. Marcie Bal Pacer pads placed anterior and posterior chest.  Cardioverted 1 time(s).  Cardioversion with synchronized biphasic 200J shock.  Evaluation: Findings: Post procedure EKG shows: A-paced, V-paced 60 bpm  Normal pacemaker check after shock shows normal device function, estimated generator longevity 1.5 years. Complications: None Patient did tolerate procedure well.  Time Spent Directly with the Patient:  30 minutes   Shane [name] 10/21/2019, 9:12 AM

## 2019-10-21 NOTE — Interval H&P Note (Signed)
History and Physical Interval Note:  10/21/2019 8:32 AM  [name]  has presented today for surgery, with the diagnosis of AFIB.  The various methods of treatment have been discussed with the patient and family. After consideration of risks, benefits and other options for treatment, the patient has consented to  Procedure(s): CARDIOVERSION (N/A) as a surgical intervention.  The patient's history has been reviewed, patient examined, no change in status, stable for surgery.  I have reviewed the patient's chart and labs.  Questions were answered to the patient's satisfaction.     Shane [name]

## 2019-10-21 NOTE — Anesthesia Preprocedure Evaluation (Signed)
Anesthesia Evaluation  Patient identified by MRN, date of birth, ID band Patient awake    Reviewed: Allergy & Precautions, H&P , NPO status , Patient's Chart, lab work & pertinent test results  Airway Mallampati: II   Neck ROM: full    Dental   Pulmonary sleep apnea , former smoker,    breath sounds clear to auscultation       Cardiovascular +CHF  + dysrhythmias Atrial Fibrillation + pacemaker  Rhythm:irregular Rate:Normal     Neuro/Psych    GI/Hepatic   Endo/Other    Renal/GU      Musculoskeletal  (+) Arthritis ,   Abdominal   Peds  Hematology   Anesthesia Other Findings   Reproductive/Obstetrics                             Anesthesia Physical Anesthesia Plan  ASA: III  Anesthesia Plan: General   Post-op Pain Management:    Induction: Intravenous  PONV Risk Score and Plan: 2 and Propofol infusion and Treatment may vary due to age or medical condition  Airway Management Planned: Mask  Additional Equipment:   Intra-op Plan:   Post-operative Plan:   Informed Consent: I have reviewed the patients History and Physical, chart, labs and discussed the procedure including the risks, benefits and alternatives for the proposed anesthesia with the patient or authorized representative who has indicated his/her understanding and acceptance.       Plan Discussed with: CRNA and Anesthesiologist  Anesthesia Plan Comments:         Anesthesia Quick Evaluation

## 2019-10-21 NOTE — Transfer of Care (Signed)
Immediate Anesthesia Transfer of Care Note  Patient: Shane [name]  Procedure(s) Performed: CARDIOVERSION (N/A )  Patient Location: Endoscopy Unit  Anesthesia Type:General  Level of Consciousness: drowsy and patient cooperative  Airway & Oxygen Therapy: Patient Spontanous Breathing  Post-op Assessment: Report given to RN and Post -op Vital signs reviewed and stable  Post vital signs: Reviewed and stable  Last Vitals:  Vitals Value Taken Time  BP 100/55 10/21/19 0911  Temp    Pulse 60 10/21/19 0911  Resp 18 10/21/19 0911  SpO2 98 % 10/21/19 0911    Last Pain:  Vitals:   10/21/19 0844  TempSrc: Oral  PainSc: 0-No pain         Complications: No apparent anesthesia complications

## 2019-10-21 NOTE — Anesthesia Postprocedure Evaluation (Signed)
Anesthesia Post Note  Patient: Shane [name]  Procedure(s) Performed: CARDIOVERSION (N/A )     Patient location during evaluation: Endoscopy Anesthesia Type: General Level of consciousness: awake and alert Pain management: pain level controlled Vital Signs Assessment: post-procedure vital signs reviewed and stable Respiratory status: spontaneous breathing, nonlabored ventilation, respiratory function stable and patient connected to nasal cannula oxygen Cardiovascular status: blood pressure returned to baseline and stable Postop Assessment: no apparent nausea or vomiting Anesthetic complications: no    Last Vitals:  Vitals:   10/21/19 0910 10/21/19 0911  BP:  (!) 100/55  Pulse: 61 60  Resp: 15 18  Temp:  36.6 C  SpO2: 100% 98%    Last Pain:  Vitals:   10/21/19 0920  TempSrc:   PainSc: 0-No pain                 Abdelrahman Nair S

## 2019-10-24 ENCOUNTER — Telehealth: Payer: Self-pay | Admitting: Cardiology

## 2019-10-24 NOTE — Telephone Encounter (Signed)
I spoke to the patient who called because he had a Cardioversion last Friday 2/19.    On Saturday night, he experienced "chills", had trouble breathing and was in "distress".  He put on his CPAP and that seemed to help.  He had mild CP, but was not sure that it wasn't indigestion.  He was "burping" a lot.  His HR was 60.    His symptoms started to ease up this morning and he is feeling a little better.  He has an appointment with A Fib clinic in late March, but wanted [place] to know that he is in [place] tomorrow to pick up a new CPAP was looking for advisement.

## 2019-10-24 NOTE — Telephone Encounter (Signed)
Spoke with patient to obtain new heartrate and readings which were 65-72 range. Patient states his symptoms have gotten a little better and would really like to be seen earlier than March for Afib . Patient appointment has been moved up to 10-25-19 at 2:30 with Roderic Palau NP

## 2019-10-24 NOTE — Progress Notes (Signed)
Post procedure phone call completed for cardioversion on 2/19.  Pt stated he felt fine and then on Saturday, he had chest pain that felt like indigestion, he felt short of breath, and he was burping.  Pt stated the discomfort subsided but did not disappear.  Pt advised to follow up with cardiologist for additional steps.  Pt verbalized understanding.

## 2019-10-24 NOTE — Telephone Encounter (Signed)
New Message    Pt is calling and Says he had a Cardioversion done on Friday.  Pt says Saturday night he was feeling Cold and was experiencing some SOB Pt says the only thing he has now is some indigestion   Please call

## 2019-10-25 ENCOUNTER — Ambulatory Visit (HOSPITAL_COMMUNITY)
Admission: RE | Admit: 2019-10-25 | Discharge: 2019-10-25 | Disposition: A | Discharge status: Home / Self Care | Payer: BC Managed Care – PPO | Source: Ambulatory Visit | Attending: Nurse Practitioner | Admitting: Nurse Practitioner

## 2019-10-25 ENCOUNTER — Other Ambulatory Visit: Payer: Self-pay

## 2019-10-25 ENCOUNTER — Encounter (HOSPITAL_COMMUNITY): Payer: Self-pay | Admitting: Nurse Practitioner

## 2019-10-25 VITALS — BP 108/52 | HR 60 | Ht 71.0 in | Wt 270.2 lb

## 2019-10-25 DIAGNOSIS — R0602 Shortness of breath: Secondary | ICD-10-CM | POA: Diagnosis not present

## 2019-10-25 DIAGNOSIS — G4730 Sleep apnea, unspecified: Secondary | ICD-10-CM | POA: Insufficient documentation

## 2019-10-25 DIAGNOSIS — R5383 Other fatigue: Secondary | ICD-10-CM | POA: Insufficient documentation

## 2019-10-25 DIAGNOSIS — Z87891 Personal history of nicotine dependence: Secondary | ICD-10-CM | POA: Diagnosis not present

## 2019-10-25 DIAGNOSIS — I4891 Unspecified atrial fibrillation: Secondary | ICD-10-CM | POA: Insufficient documentation

## 2019-10-25 DIAGNOSIS — M109 Gout, unspecified: Secondary | ICD-10-CM | POA: Diagnosis not present

## 2019-10-25 DIAGNOSIS — D6869 Other thrombophilia: Secondary | ICD-10-CM | POA: Diagnosis not present

## 2019-10-25 NOTE — Progress Notes (Addendum)
Primary Care Physician: Albina Billet, MD Referring Physician: Dr. Curt Bears Cardiology: Dr. Evans Shane [name] is a 74 y.o. male with a h/o paroxysmal afib, s/p ablation in 2018 and being seen in f/u in afib clinic for successful  cardioversion last Friday. This was set up by Tommye Standard when she was seen in the device clinic  and found to be out of rhythm.   Today the pt states that he has not  felt improved since cardioversion, 2/12.Marland Kitchen He describes Saturday that he was taking a shower and felt very chilled. He dried off and then felt very short of breath.  He just still feels tired and short of breath with exertion, even thought interrogation of this device shows that he is in SR . His weight at home  is stable. No evidence of fluid weight. He does take lasix daily. He admits to some true weight gain over the last year and being out of the gym and more sedentary. No other symptoms of illness/covid. He continues on eliquis 5 mg bid for CHA2DS2VASc score of 4.  Today, he denies symptoms of palpitations, chest pain,+ shortness of breath, orthopnea, PND, lower extremity edema, dizziness, presyncope, syncope, or neurologic sequela.+ fatigue. The patient is tolerating medications without difficulties and is otherwise without complaint today.   Past Medical History:  Diagnosis Date  . Atrioventricular block   . Degenerative joint disease   . Gout   . Paroxysmal atrial fibrillation (HCC)   . 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 Sinus node dysfunction (HCC)   . Sleep apnea    CPAP  . 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 ATRIAL FIBRILLATION ABLATION N/A 04/03/2017   Procedure: Atrial Fibrillation Ablation;  Surgeon: Constance Haw, MD;  Location: [place] [facility];  Service: Cardiovascular;  Laterality: N/A;  . CARDIAC CATHETERIZATION  10/13/1994   No CAD  . CARDIAC CATHETERIZATION   05/10/2003   No CAD  . CARDIOVERSION N/A 10/21/2016   Procedure: CARDIOVERSION;  Surgeon: Sanda Klein, MD;  Location: [place] [facility];  Service: Cardiovascular;  Laterality: N/A;  . CARDIOVERSION N/A 05/15/2017   Procedure: CARDIOVERSION;  Surgeon: Fay Records, MD;  [facility];  Service: Cardiovascular;  Laterality: N/A;  . CARDIOVERSION N/A 10/21/2019   Procedure: CARDIOVERSION;  Surgeon: Sanda Klein, MD;  Location: [place] ENDOSCOPY;  Service: Cardiovascular;  Laterality: N/A;  . CATARACT EXTRACTION W/PHACO Left 05/18/2019   Procedure: CATARACT EXTRACTION PHACO AND INTRAOCULAR LENS PLACEMENT ([place]) LEFT  1:21 11.0% 9.01;  Surgeon: Leandrew Koyanagi, MD;  Location: [place];  Service: Ophthalmology;  Laterality: Left;  sleep apnea  . CATARACT EXTRACTION W/PHACO Right 06/29/2019   Procedure: CATARACT EXTRACTION PHACO AND INTRAOCULAR LENS PLACEMENT ([facility]) RIGHT  01:14.2  14.5%  10.92;  Surgeon: Leandrew Koyanagi, MD;  Location: [place];  Service: Ophthalmology;  Laterality: Right;  sleep apnea  . CYST EXCISION  03/2019   on back   . NM MYOCAR PERF WALL MOTION  05/07/2011   Lexiscan: No ishcemia  . PERMANENT PACEMAKER INSERTION  03/12/2010   guidant  . ROTATOR CUFF REPAIR  1996  . SHOULDER ARTHROSCOPY    . US ECHOCARDIOGRAPHY  05/07/2011   mod. LVH,LA mod. dilated,borderline aortic root dilatation    Current Outpatient Medications  Medication Sig Dispense Refill  . allopurinol (ZYLOPRIM) 300 MG tablet Take 1 tablet (300  mg total) by mouth daily. 30 tablet 2  . anastrozole (ARIMIDEX) 1 MG tablet Take 1 mg by mouth daily.     Marland Kitchen apixaban (ELIQUIS) 5 MG TABS tablet Take 1 tablet (5 mg total) by mouth 2 (two) times daily. 60 tablet 11  . Artificial Tear Ointment (DRY EYES OP) Place 1 drop into both eyes daily as needed (dry eyes).    . Ascorbic Acid (VITAMIN C) 1000 MG tablet Take 1,000 mg by mouth daily.    . Cholecalciferol (VITAMIN D-3) 1000 UNITS CAPS Take  1,000 Units by mouth daily.     . citalopram (CELEXA) 40 MG tablet Take 1 tablet (40 mg total) by mouth daily. MUST RECEIVE FUTURE REFILLS FROM PCP. (Patient taking differently: Take 40 mg by mouth daily. ) 30 tablet 0  . co-enzyme Q-10 50 MG capsule Take 50 mg by mouth daily.     . colchicine 0.6 MG tablet Take 0.5 tablets (0.3 mg total) by mouth daily as needed. (Patient taking differently: Take 0.3 mg by mouth daily as needed (gout). ) 30 tablet 2  . diltiazem (CARDIZEM CD) 240 MG 24 hr capsule Take 1 capsule (240 mg total) by mouth daily. 30 capsule 11  . furosemide (LASIX) 40 MG tablet Take 1 tablet (40 mg total) by mouth daily. 30 tablet 11  . montelukast (SINGULAIR) 10 MG tablet Take 10 mg by mouth at bedtime.    . Multiple Vitamins-Minerals (ZINC PO) Take 50 mg by mouth daily. Silver adult ober 92    . Omega-3 Fatty Acids (FISH OIL) 1200 MG CAPS Take 1,200 mg by mouth 2 (two) times daily.     Marland Kitchen OVER THE COUNTER MEDICATION Take 100 mg by mouth daily. CBD oil    . psyllium (HYDROCIL/METAMUCIL) 95 % PACK Take 1 packet by mouth at bedtime.    . pyridOXINE (VITAMIN B-6) 100 MG tablet Take 100 mg by mouth daily.    . tamsulosin (FLOMAX) 0.4 MG CAPS capsule Take 1 capsule (0.4 mg total) by mouth daily. 30 capsule 6  . Testosterone 1.62 % GEL Apply 3 Pump topically daily. Shoulders  1  . vitamin B-12 (CYANOCOBALAMIN) 1000 MCG tablet Take 1,000 mcg by mouth daily.    Marland Kitchen zinc gluconate 50 MG tablet Take 50 mg by mouth daily.     No current facility-administered medications for this encounter.    No Known Allergies  Social History   Socioeconomic History  . Marital status: Married    Spouse name: Not on file  . Number of children: Not on file  . Years of education: Not on file  . Highest education level: Not on file  Occupational History  . Not on file  Tobacco Use  . Smoking status: Former Smoker    Types: Cigars    Quit date: 05/11/1993    Years since quitting: 26.4  . Smokeless  tobacco: Former Systems developer    Types: [place] date: 05/11/1993  Substance and Sexual Activity  . Alcohol use: Not Currently    Comment: social  . Drug use: No  . Sexual activity: Not on file  Other Topics Concern  . Not on file  Social History Narrative  . Not on file   Social Determinants of Health   Financial Resource Strain:   . Difficulty of Paying Living Expenses: Not on file  Food Insecurity:   . Worried About Charity fundraiser in the Last Year: Not on file  . Ran Out  of Food in the Last Year: Not on file  Transportation Needs:   . Lack of Transportation (Medical): Not on file  . Lack of Transportation (Non-Medical): Not on file  Physical Activity:   . Days of Exercise per Week: Not on file  . Minutes of Exercise per Session: Not on file  Stress:   . Feeling of Stress : Not on file  Social Connections:   . Frequency of Communication with Friends and Family: Not on file  . Frequency of Social Gatherings with Friends and Family: Not on file  . Attends Religious Services: Not on file  . Active Member of Clubs or Organizations: Not on file  . Attends Archivist Meetings: Not on file  . Marital Status: Not on file  Intimate Partner Violence:   . Fear of Current or Ex-Partner: Not on file  . Emotionally Abused: Not on file  . Physically Abused: Not on file  . Sexually Abused: Not on file    Family History  Problem Relation Age of Onset  . Stroke Mother   . Heart failure Mother   . Alzheimer's disease Father   . Heart disease Sister   . Dementia Sister     ROS- All systems are reviewed and negative except as per the HPI above  Physical Exam: Vitals:   10/25/19 1447  BP: (!) 108/52  Pulse: 60  Weight: 122.6 kg  Height: 5\' 11"  (1.803 m)   Wt Readings from Last 3 Encounters:  10/25/19 122.6 kg  10/21/19 122.9 kg  10/14/19 122.9 kg    Labs: Lab Results  Component Value Date   NA 139 10/21/2019   K 3.8 10/21/2019   CL 102 10/21/2019   CO2 31  10/04/2019   GLUCOSE 97 10/21/2019   BUN 14 10/21/2019   CREATININE 0.90 10/21/2019   CALCIUM 9.5 10/04/2019   MG 2.0 04/22/2017   Lab Results  Component Value Date   INR 1.1 10/14/2016   No results found for: CHOL, HDL, LDLCALC, TRIG   GEN- The patient is well appearing, alert and oriented x 3 today.   Head- normocephalic, atraumatic Eyes-  Sclera clear, conjunctiva pink Ears- hearing intact Oropharynx- clear Neck- supple, no JVP Lymph- no cervical lymphadenopathy Lungs- Clear to ausculation bilaterally, normal work of breathing Heart- Regular rate and rhythm, no murmurs, rubs or gallops, PMI not laterally displaced GI- soft, NT, ND, + BS Extremities- no clubbing, cyanosis, or edema MS- no significant deformity or atrophy Skin- no rash or lesion Psych- euthymic mood, full affect Neuro- strength and sensation are intact  EKG-AV dual paced rhythm with occasional PVC's Interrogation as below   Assessment and Plan: 1. Afib  Recent successful cardioversion Continue  eliquis 5 mg bid for CHA2DS2VASc score of 4 Pt still feels symptomatic with fatigue and shortness of breath with exertion  Device interrogated and in SR since cardioversion, changes made as following:   Per Joey with BS-  " Interrogation showed false mode switch episodes recorded since 2/21( DCCV). False episodes are atrial double counting due to atrial far field. Mode switch fall back changed form VDJR to DDIR. Turned minute ventilation on @ 3. Mode switch rate changed from  160 to 180."  I will request f/u in EP  with device interrogation in one week  to see if device  changes have improved his symptoms If not, and continues in SR, consider updating echo  and w/u for ischemia    Butch Penny C. Duc Crocket, [facility]  [facility] [address] [place], [place] [address] [phone]

## 2019-10-26 NOTE — Progress Notes (Signed)
Thanks for f/u ?

## 2019-11-06 NOTE — Progress Notes (Signed)
Cardiology Office Note Date:  11/06/2019  Patient [name], Shane [name] 01-Nov-1945, MRN [id number] PCP:  Albina Billet, MD  Cardiologist:  Dr. Sallyanne Kuster (2018) Electrophysiologist; Dr. Curt Bears    Chief Complaint:  6 mo f/u  History of Present Illness: Shane [name] is a 74 y.o. male with history of tachy-brady w/PPM, Persistent Afib, chronic CHF (diastolic), OSA w/CPAP, morbid obesity.  He comes in today to be seen for Dr. Curt Bears, last seen by him Sept 2020, at that time doing well, no changes were made  I saw him 10/14/2019 He reported feeling "OK", but not surprised to hear he was in AFib today feeling unusually fatigued, reduced energy now for about a month.  No overt sensation of palpitations, no CP.   He has some degree of baseline DOE and this is unchanged. He had been been more sedentary then usual the last year and put on some weight. He reported compliance with his Eliquis,no missed doses in the last 3 weeks, and even longer.  Says he is very good about this. He reported compliance with his CPAP but the mask has become uncomfortable. No dizzy spells, no near syncope or syncope.  He denied any bleeding or signs of bleeding He has not followed with Dr. Sallyanne Kuster, since his AFib seemed to be his pressing cardiac issue, and has been following with Dr. Curt Bears alone. He was in an ongoing AFib episode and planned for DCCV  He was cardioverted successfully 10/21/19  Had Afib clinic follow up 10/25/2019 Reported still some ongoing fatigue though device interrogation noted maintaining SR.  B[employer]i rep noted  false mode switch episodes recorded since 2/21( DCCV). False episodes are atrial double counting due to atrial far field. Mode switch fall back changed form VDIR to DDIR. Turned minute ventilation on @ 3. Mode switch rate changed from  160 to 180 Planned to have f/u to evaluate if these changes improved his symptoms. Recommended if not and maintaining SR to pursue echo and  ischemic w/u.   He is in fact feeling a bit better since the programming changes.  Though wishes he had nore energy, still feeling fatigued some.   No CP, palpitations, no dizzy spells,  Near syncope or syncope No bleeding or signs of bleeding  He mentions he has been belching a lot.  No epigastric/chest, or belly pain/discomfort, just belching frequently.  He has not had any changes in his medicines or his diet.   Afib hx Noted as a diagnosis as far back as record goes, 2014 DCCV x 2 shocks failed PVI ablation 04/03/2017, Dr. Curt Bears  AAD hx None to date  Device information BSCi dual chamber PPM, implanted 06/25/2003, gen change 2011   Past Medical History:  Diagnosis Date  . Atrioventricular block   . Degenerative joint disease   . Gout   . Paroxysmal atrial fibrillation (HCC)   . Presence of permanent cardiac pacemaker 03/12/10   guidant  ([place])  . Sinus node dysfunction (HCC)   . Sleep apnea    CPAP  . Wears hearing aid in both ears     Past Surgical History:  Procedure Laterality Date  . ATRIAL FIBRILLATION ABLATION N/A 04/03/2017   Procedure: Atrial Fibrillation Ablation;  Surgeon: Constance Haw, MD;  Location: [place] [facility];  Service: Cardiovascular;  Laterality: N/A;  . CARDIAC CATHETERIZATION  10/13/1994   No CAD  . CARDIAC CATHETERIZATION  05/10/2003   No CAD  . CARDIOVERSION N/A 10/21/2016  Procedure: CARDIOVERSION;  Surgeon: Sanda Klein, MD;  Location: [facility] ENDOSCOPY;  Service: Cardiovascular;  Laterality: N/A;  . CARDIOVERSION N/A 05/15/2017   Procedure: CARDIOVERSION;  Surgeon: Fay Records, MD;  Location: [facility] ENDOSCOPY;  Service: Cardiovascular;  Laterality: N/A;  . CARDIOVERSION N/A 10/21/2019   Procedure: CARDIOVERSION;  Surgeon: Sanda Klein, MD;  Location: [place] ENDOSCOPY;  Service: Cardiovascular;  Laterality: N/A;  . CATARACT EXTRACTION W/PHACO Left 05/18/2019   Procedure: CATARACT EXTRACTION PHACO AND INTRAOCULAR LENS  PLACEMENT ([place]) LEFT  1:21 11.0% 9.01;  Surgeon: Leandrew Koyanagi, MD;  Location: [place];  Service: Ophthalmology;  Laterality: Left;  sleep apnea  . CATARACT EXTRACTION W/PHACO Right 06/29/2019   Procedure: CATARACT EXTRACTION PHACO AND INTRAOCULAR LENS PLACEMENT ([facility]) RIGHT  01:14.2  14.5%  10.92;  Surgeon: Leandrew Koyanagi, MD;  Location: [place];  Service: Ophthalmology;  Laterality: Right;  sleep apnea  . CYST EXCISION  03/2019   on back   . NM MYOCAR PERF WALL MOTION  05/07/2011   Lexiscan: No ishcemia  . PERMANENT PACEMAKER INSERTION  03/12/2010   guidant  . ROTATOR CUFF REPAIR  1996  . SHOULDER ARTHROSCOPY    . US ECHOCARDIOGRAPHY  05/07/2011   mod. LVH,LA mod. dilated,borderline aortic root dilatation    Current Outpatient Medications  Medication Sig Dispense Refill  . allopurinol (ZYLOPRIM) 300 MG tablet Take 1 tablet (300 mg total) by mouth daily. 30 tablet 2  . anastrozole (ARIMIDEX) 1 MG tablet Take 1 mg by mouth daily.     Marland Kitchen apixaban (ELIQUIS) 5 MG TABS tablet Take 1 tablet (5 mg total) by mouth 2 (two) times daily. 60 tablet 11  . Artificial Tear Ointment (DRY EYES OP) Place 1 drop into both eyes daily as needed (dry eyes).    . Ascorbic Acid (VITAMIN C) 1000 MG tablet Take 1,000 mg by mouth daily.    . Cholecalciferol (VITAMIN D-3) 1000 UNITS CAPS Take 1,000 Units by mouth daily.     . citalopram (CELEXA) 40 MG tablet Take 1 tablet (40 mg total) by mouth daily. MUST RECEIVE FUTURE REFILLS FROM PCP. (Patient taking differently: Take 40 mg by mouth daily. ) 30 tablet 0  . co-enzyme Q-10 50 MG capsule Take 50 mg by mouth daily.     . colchicine 0.6 MG tablet Take 0.5 tablets (0.3 mg total) by mouth daily as needed. (Patient taking differently: Take 0.3 mg by mouth daily as needed (gout). ) 30 tablet 2  . diltiazem (CARDIZEM CD) 240 MG 24 hr capsule Take 1 capsule (240 mg total) by mouth daily. 30 capsule 11  . furosemide (LASIX) 40 MG tablet Take 1  tablet (40 mg total) by mouth daily. 30 tablet 11  . montelukast (SINGULAIR) 10 MG tablet Take 10 mg by mouth at bedtime.    . Multiple Vitamins-Minerals (ZINC PO) Take 50 mg by mouth daily. Silver adult ober 73    . Omega-3 Fatty Acids (FISH OIL) 1200 MG CAPS Take 1,200 mg by mouth 2 (two) times daily.     Marland Kitchen OVER THE COUNTER MEDICATION Take 100 mg by mouth daily. CBD oil    . psyllium (HYDROCIL/METAMUCIL) 95 % PACK Take 1 packet by mouth at bedtime.    . pyridOXINE (VITAMIN B-6) 100 MG tablet Take 100 mg by mouth daily.    . tamsulosin (FLOMAX) 0.4 MG CAPS capsule Take 1 capsule (0.4 mg total) by mouth daily. 30 capsule 6  . Testosterone 1.62 % GEL Apply 3 Pump topically  daily. Shoulders  1  . vitamin B-12 (CYANOCOBALAMIN) 1000 MCG tablet Take 1,000 mcg by mouth daily.    Marland Kitchen zinc gluconate 50 MG tablet Take 50 mg by mouth daily.     No current facility-administered medications for this visit.    Allergies:   Patient has no known allergies.   Social History:  The patient  reports that he quit smoking about 26 years ago. His smoking use included cigars. He quit smokeless tobacco use about 26 years ago.  His smokeless tobacco use included chew. He reports previous alcohol use. He reports that he does not use drugs.   Family History:  The patient's family history includes Alzheimer's disease in his father; Dementia in his sister; Heart disease in his sister; Heart failure in his mother; Stroke in his mother.  ROS:  Please see the history of present illness.  All other systems are reviewed and otherwise negative.   PHYSICAL EXAM:  VS:  There were no vitals taken for this visit. BMI: There is no height or weight on file to calculate BMI. Well nourished, well developed, in no acute distress  HEENT: normocephalic, atraumatic  Neck: no JVD, carotid bruits or masses Cardiac:  RRR, no significant murmurs, no rubs, or gallops Lungs:  CTA b/l, no wheezing, rhonchi or rales  Abd: soft, nontender,  obese MS: no deformity or atrophy Ext: no edema  Skin: warm and dry, no rash Neuro:  No gross deficits appreciated Psych: euthymic mood, full affect  PPM site is stable, no tethering or discomfort   EKG:  Not done today  PPM interrogation done by industry today and reviewed by myself:  Battery estimate is 87mo Lead measurements are stable, no R waves at 40bpm today He has had 2 brief episodes of A channel FF sensing, both only seconds in duration This is markedly improved I note his A sensitivity is at 0.15 In d/w BSCi rep who is familiar with the patient/case, recalls historically had issues with undersensing true AFib He adjusted his minute ventilation today further from 3-5 in effort to provide better HR response to activity in effor to improve his fatigue      04/03/2017: EPS/ablation CONCLUSIONS: 1. Atrial fibrillation upon presentation.   2. Successful electrical isolation and anatomical encircling of all four pulmonary veins with radiofrequency current.  A WACA approach was used 3. Additional left atrial ablation was performed with a LA roof line created along the posterior wall of the left atrium 4. Atrial fibrillation successfully cardioverted to sinus rhythm. 5. No early apparent complications.   TTE 11/06/16  Review of the above records today demonstrates:  - Left ventricle: Septal hypokinesis. The cavity size was moderately dilated. Wall thickness was increased in a pattern of mild LVH. Systolic function was normal. The estimated ejection fraction was in the range of 50% to 55%. - Aortic valve: There was trivial regurgitation. - Left atrium: The atrium was mildly dilated.   Recent Labs: 10/04/2019: ALT 18; Platelets 278 10/21/2019: BUN 14; Creatinine, Ser 0.90; Hemoglobin 15.6; Potassium 3.8; Sodium 139  No results found for requested labs within last 8760 hours.   Estimated Creatinine Clearance: 94.5 mL/min (by C-G formula based on SCr of 0.9 mg/dL).    Wt Readings from Last 3 Encounters:  10/25/19 270 lb 3.2 oz (122.6 kg)  10/21/19 271 lb (122.9 kg)  10/14/19 271 lb (122.9 kg)     Other studies reviewed: Additional studies/records reviewed today include: summarized above  ASSESSMENT AND PLAN:  1.  Persistent Afib     CHA2DS2Vasc is 2, on Eliquis, appropriately dosed     S/p PVI ablation 2018     None since his DCCV       2. PPM     Intact function     Brady pacing is DDDR, mode switch is at 180bpm and DDIR       3. Chronic CHF (diastolic)     No symptoms or exam findings to suggest overt volume OL  4. OSA     Reports compliance with his CPAP though mask has become uncomfortable and requests a new machine/mask     He reports Dr. Claiborne Billings was original OSA MD     Asked my MA to follow up on re-evaluation by Dr. Claiborne Billings given his mask has become uncomfortable  5. Fatgue     ? Inadequate CPA     ? Deconditioning     Has not had an echo is a few years, will update       Disposition:  I have asked the devic clinic to have him in for monthly battery checks, unable to do remotes with his device, see him back in 3-4 months, sooner if needed  Current medicines are reviewed at length with the patient today.  The patient did not have any concerns regarding medicines.  Venetia Night, PA-C 11/06/2019 8:47 AM     [address] [place] [address] [phone] (office)  [phone] (fax)

## 2019-11-08 ENCOUNTER — Ambulatory Visit: Payer: BC Managed Care – PPO | Admitting: Physician Assistant

## 2019-11-08 ENCOUNTER — Other Ambulatory Visit: Payer: Self-pay

## 2019-11-08 ENCOUNTER — Encounter (INDEPENDENT_AMBULATORY_CARE_PROVIDER_SITE_OTHER): Payer: Self-pay

## 2019-11-08 VITALS — BP 106/58 | HR 71 | Ht 71.0 in | Wt 264.0 lb

## 2019-11-08 DIAGNOSIS — Z950 Presence of cardiac pacemaker: Secondary | ICD-10-CM | POA: Diagnosis not present

## 2019-11-08 DIAGNOSIS — I5032 Chronic diastolic (congestive) heart failure: Secondary | ICD-10-CM | POA: Diagnosis not present

## 2019-11-08 DIAGNOSIS — I4819 Other persistent atrial fibrillation: Secondary | ICD-10-CM | POA: Diagnosis not present

## 2019-11-08 NOTE — Patient Instructions (Signed)
Medication Instructions:   Your physician recommends that you continue on your current medications as directed. Please refer to the Current Medication list given to you today.  *If you need a refill on your cardiac medications before your next appointment, please call your pharmacy*   Lab Work: [place]   If you have labs (blood work) drawn today and your tests are completely normal, you will receive your results only by: Marland Kitchen MyChart Message (if you have MyChart) OR . A paper copy in the mail If you have any lab test that is abnormal or we need to change your treatment, we will call you to review the results.   Testing/Procedures: Your physician has requested that you have an echocardiogram. Echocardiography is a painless test that uses sound waves to create images of your heart. It provides your doctor with information about the size and shape of your heart and how well your heart's chambers and valves are working. This procedure takes approximately one hour. There are no restrictions for this procedure.   Follow-Up: At [facility], you and your health needs are our priority.  As part of our continuing mission to provide you with exceptional heart care, we have created designated Provider Care Teams.  These Care Teams include your primary Cardiologist (physician) and Advanced Practice Providers (APPs -  Physician Assistants and Nurse Practitioners) who all work together to provide you with the care you need, when you need it.  We recommend signing up for the patient portal called "MyChart".  Sign up information is provided on this After Visit Summary.  MyChart is used to connect with patients for Virtual Visits (Telemedicine).  Patients are able to view lab/test results, encounter notes, upcoming appointments, etc.  Non-urgent messages can be sent to your provider as well.   To learn more about what you can do with MyChart, go to [email or web address].    Your next  appointment:    3 month(s)  The format for your next appointment:   In Person  Provider:   You may see Dr. Curt Bears or one of the following Advanced Practice Providers on your designated Care Team:    Chanetta Marshall, NP  Tommye Standard, PA-C  Legrand Como "Oda Kilts, [place]    Other Instructions

## 2019-11-08 NOTE — Progress Notes (Signed)
Thanks, [employer]

## 2019-11-09 ENCOUNTER — Telehealth: Payer: Self-pay | Admitting: Cardiovascular Disease

## 2019-11-09 ENCOUNTER — Telehealth (HOSPITAL_COMMUNITY): Payer: Self-pay | Admitting: Physician Assistant

## 2019-11-09 NOTE — Telephone Encounter (Signed)
New message   I received a message to get patient scheduled for a 3 month f/u appt with Dr. Curt Bears. I pulled up the patient appt desk and noticed an appt scheduled with Dr. Sallyanne Kuster. The patient saw Tommye Standard yesterday and scheduled a monthly device check and f/u w/WC before leaving. I checked with the scheduler and asked why those got cancelled and the only appt RS was the one with Dr. Sallyanne Kuster. She said the patient asked for the appts to be cancelled and only be scheduled with Dr. Sallyanne Kuster at [facility]. I asked Renee who the patient saw for his device and she said that Dr. Sallyanne Kuster was fine to follow his device, he had been following Dr. Curt Bears for afib. Renee said she did tell the patient, he would come to the [facility] for a monthly battery check since he had 6 months.   I called the patient to RS his monthly battery check here at [facility] and then schedule a 3 month f/u with Dr. Sallyanne Kuster. The patient said he was scheduled with Dr. Loletha Grayer on 4/5 and would go to that appt because he was not coming back to the [facility]. I informed the patient the monthly checks were done here with the device clinic and it was a quick in and out just to check the battery and Renee had gone over that with him at his visit. He said no, I will go see Dr. Loletha Grayer and if I have to come back to [facility] after I see him then I will leave it up to him. Thank you and goodbye.   Will forward to treatment team for their FYI/review/advisement

## 2019-11-09 NOTE — Telephone Encounter (Signed)
I called patient to schedule Echocardiogram and left a message.  Patient returned my call yesterday and spoke with Angeline Hammer and declined to schedule appointment and states to her he is doing fine.  Order will be removed from the WQ.

## 2019-11-09 NOTE — Telephone Encounter (Signed)
I'll try to explain to him at the April appointment, thanks!

## 2019-11-21 ENCOUNTER — Ambulatory Visit (HOSPITAL_COMMUNITY): Payer: BC Managed Care – PPO | Admitting: Nurse Practitioner

## 2019-11-21 ENCOUNTER — Encounter: Payer: BC Managed Care – PPO | Admitting: Physician Assistant

## 2019-12-05 ENCOUNTER — Other Ambulatory Visit: Payer: Self-pay

## 2019-12-05 ENCOUNTER — Ambulatory Visit (INDEPENDENT_AMBULATORY_CARE_PROVIDER_SITE_OTHER): Payer: BC Managed Care – PPO | Admitting: Cardiovascular Disease

## 2019-12-05 ENCOUNTER — Encounter: Payer: Self-pay | Admitting: Cardiovascular Disease

## 2019-12-05 VITALS — BP 124/76 | HR 82 | Ht 71.0 in | Wt 268.0 lb

## 2019-12-05 DIAGNOSIS — I10 Essential (primary) hypertension: Secondary | ICD-10-CM

## 2019-12-05 DIAGNOSIS — Z6837 Body mass index (BMI) 37.0-37.9, adult: Secondary | ICD-10-CM

## 2019-12-05 DIAGNOSIS — G4733 Obstructive sleep apnea (adult) (pediatric): Secondary | ICD-10-CM

## 2019-12-05 DIAGNOSIS — R0602 Shortness of breath: Secondary | ICD-10-CM

## 2019-12-05 DIAGNOSIS — I5032 Chronic diastolic (congestive) heart failure: Secondary | ICD-10-CM

## 2019-12-05 DIAGNOSIS — Z950 Presence of cardiac pacemaker: Secondary | ICD-10-CM

## 2019-12-05 DIAGNOSIS — I4819 Other persistent atrial fibrillation: Secondary | ICD-10-CM | POA: Diagnosis not present

## 2019-12-05 DIAGNOSIS — I495 Sick sinus syndrome: Secondary | ICD-10-CM | POA: Diagnosis not present

## 2019-12-05 MED ORDER — FUROSEMIDE 40 MG PO TABS: 40.0000 mg | ORAL_TABLET | Freq: Every day | ORAL | 2 refills | Status: DC

## 2019-12-05 MED ORDER — APIXABAN 5 MG PO TABS: 5.0000 mg | ORAL_TABLET | Freq: Two times a day (BID) | ORAL | 11 refills | Status: DC

## 2019-12-05 MED ORDER — TAMSULOSIN HCL 0.4 MG PO CAPS: 0.4000 mg | ORAL_CAPSULE | Freq: Every day | ORAL | 2 refills | Status: DC

## 2019-12-05 MED ORDER — DILTIAZEM HCL ER COATED BEADS 240 MG PO CP24: 240.0000 mg | ORAL_CAPSULE | Freq: Every day | ORAL | 2 refills | Status: DC

## 2019-12-05 NOTE — Patient Instructions (Signed)
Medication Instructions:  No changes *If you need a refill on your cardiac medications before your next appointment, please call your pharmacy*   Lab Work: None ordered If you have labs (blood work) drawn today and your tests are completely normal, you will receive your results only by: Marland Kitchen MyChart Message (if you have MyChart) OR . A paper copy in the mail If you have any lab test that is abnormal or we need to change your treatment, we will call you to review the results.   Testing/Procedures: Your physician has requested that you have an echocardiogram. Echocardiography is a painless test that uses sound waves to create images of your heart. It provides your doctor with information about the size and shape of your heart and how well your heart's chambers and valves are working. You may receive an ultrasound enhancing agent through an IV if needed to better visualize your heart during the echo.This procedure takes approximately one hour. There are no restrictions for this procedure. This will take place at the [address].     Follow-Up: At [facility], you and your health needs are our priority.  As part of our continuing mission to provide you with exceptional heart care, we have created designated Provider Care Teams.  These Care Teams include your primary Cardiologist (physician) and Advanced Practice Providers (APPs -  Physician Assistants and Nurse Practitioners) who all work together to provide you with the care you need, when you need it.  We recommend signing up for the patient portal called "MyChart".  Sign up information is provided on this After Visit Summary.  MyChart is used to connect with patients for Virtual Visits (Telemedicine).  Patients are able to view lab/test results, encounter notes, upcoming appointments, etc.  Non-urgent messages can be sent to your provider as well.   To learn more about what you can do with MyChart, go to [email or web address].     Your next appointment:   Keep your appointment on 02/13/20 at 10 am with Der. Croitoru

## 2019-12-05 NOTE — Progress Notes (Signed)
Patient ID: Shane [name], male   DOB: 05/24/1946, 74 y.o.   MRN: [id number]     Cardiology Office Note    Date:  12/06/2019   [name], Shane [name] [birth year], MRN [id number]  PCP:  Albina Billet, MD  Cardiologist:   Sanda Klein, MD   Chief Complaint  Patient presents with  . Atrial Fibrillation  . Pacemaker Check    History of Present Illness:  Shane [name] is a 74 y.o. male who presents for persistent atrial fibrillation and pacemaker check.  He has a history of tachycardia-bradycardia syndrome, chronic diastolic heart failure, morbid obesity, obstructive sleep apnea on CPAP.  He underwent A. fib radiofrequency ablation on April 03 2017.  He developed recurrent atrial fibrillation in February 2021 with a successful cardioversion on February 19.  He also had his minute ventilation sensor turned up to 3 on his device.  These 2 interventions have led to substantial improvement in his functional ability.  His fatigue has improved most of all.  He still complains of some exertional dyspnea.  He denies dizziness, syncope, chest pain, focal neurological events, falls, injuries, bleeding complications, worsening leg edema.  He is compliant with his anticoagulant Eliquis.  He presents today with atrial paced, ventricular paced rhythm (which is his rhythm 83% of the time; ventricular pacing actually occurs 98% of the time).  Interrogation of his pacemaker shows that he has had occasional episodes of paroxysmal atrial tachycardia episodes of artifactual arrhythmia due to far field R wave oversensing, but no true atrial fibrillation.  The overall burden of atrial arrhythmia is 3%.  His pacemaker will reach elective replacement indicator in the next 6 months.  His device is not amenable to remote downloads.  Past Medical History:  Diagnosis Date  . Atrioventricular block   . Degenerative joint disease   . Gout   . Paroxysmal atrial fibrillation (HCC)   . Presence of permanent cardiac  pacemaker 03/12/10   guidant  ([place])  . Sinus node dysfunction (HCC)   . Sleep apnea    CPAP  . Wears hearing aid in both ears     Past Surgical History:  Procedure Laterality Date  . ATRIAL FIBRILLATION ABLATION N/A 04/03/2017   Procedure: Atrial Fibrillation Ablation;  Surgeon: Constance Haw, MD;  Location: [facility];  Service: Cardiovascular;  Laterality: N/A;  . CARDIAC CATHETERIZATION  10/13/1994   No CAD  . CARDIAC CATHETERIZATION  05/10/2003   No CAD  . CARDIOVERSION N/A 10/21/2016   Procedure: CARDIOVERSION;  Surgeon: Sanda Klein, MD;  Location: [facility] ENDOSCOPY;  Service: Cardiovascular;  Laterality: N/A;  . CARDIOVERSION N/A 05/15/2017   Procedure: CARDIOVERSION;  Surgeon: Fay Records, MD;  Location: [facility] ENDOSCOPY;  Service: Cardiovascular;  Laterality: N/A;  . CARDIOVERSION N/A 10/21/2019   Procedure: CARDIOVERSION;  Surgeon: Sanda Klein, MD;  Location: [facility] ENDOSCOPY;  Service: Cardiovascular;  Laterality: N/A;  . CATARACT EXTRACTION W/PHACO Left 05/18/2019   Procedure: CATARACT EXTRACTION PHACO AND INTRAOCULAR LENS PLACEMENT ([place]) LEFT  1:21 11.0% 9.01;  Surgeon: Leandrew Koyanagi, MD;  Location: [facility];  Service: Ophthalmology;  Laterality: Left;  sleep apnea  . CATARACT EXTRACTION W/PHACO Right 06/29/2019   Procedure: CATARACT EXTRACTION PHACO AND INTRAOCULAR LENS PLACEMENT ([facility]) RIGHT  01:14.2  14.5%  10.92;  Surgeon: Leandrew Koyanagi, MD;  Location: [facility];  Service: Ophthalmology;  Laterality: Right;  sleep apnea  . CYST EXCISION  03/2019   on back   .  NM MYOCAR PERF WALL MOTION  05/07/2011   Lexiscan: No ishcemia  . PERMANENT PACEMAKER INSERTION  03/12/2010   guidant  . ROTATOR CUFF REPAIR  1996  . SHOULDER ARTHROSCOPY    . US ECHOCARDIOGRAPHY  05/07/2011   mod. LVH,LA mod. dilated,borderline aortic root dilatation    Outpatient Medications Prior to Visit  Medication Sig Dispense Refill  . allopurinol  (ZYLOPRIM) 300 MG tablet Take 1 tablet (300 mg total) by mouth daily. 30 tablet 2  . anastrozole (ARIMIDEX) 1 MG tablet Take 1 mg by mouth daily.     . Artificial Tear Ointment (DRY EYES OP) Place 1 drop into both eyes daily as needed (dry eyes).    . Ascorbic Acid (VITAMIN C) 1000 MG tablet Take 1,000 mg by mouth daily.    . Cholecalciferol (VITAMIN D-3) 1000 UNITS CAPS Take 1,000 Units by mouth daily.     . citalopram (CELEXA) 40 MG tablet Take 1 tablet (40 mg total) by mouth daily. MUST RECEIVE FUTURE REFILLS FROM PCP. (Patient taking differently: Take 40 mg by mouth daily. ) 30 tablet 0  . co-enzyme Q-10 50 MG capsule Take 50 mg by mouth daily.     . colchicine 0.6 MG tablet Take 0.5 tablets (0.3 mg total) by mouth daily as needed. (Patient taking differently: Take 0.3 mg by mouth daily as needed (gout). ) 30 tablet 2  . montelukast (SINGULAIR) 10 MG tablet Take 10 mg by mouth at bedtime.    . Multiple Vitamins-Minerals (ZINC PO) Take 50 mg by mouth daily. Silver adult ober 108    . Omega-3 Fatty Acids (FISH OIL) 1200 MG CAPS Take 1,200 mg by mouth 2 (two) times daily.     Marland Kitchen OVER THE COUNTER MEDICATION Take 100 mg by mouth daily. CBD oil    . psyllium (HYDROCIL/METAMUCIL) 95 % PACK Take 1 packet by mouth at bedtime.    . pyridOXINE (VITAMIN B-6) 100 MG tablet Take 100 mg by mouth daily.    . Testosterone 1.62 % GEL Apply 3 Pump topically daily. Shoulders  1  . vitamin B-12 (CYANOCOBALAMIN) 1000 MCG tablet Take 1,000 mcg by mouth daily.    Marland Kitchen zinc gluconate 50 MG tablet Take 50 mg by mouth daily.    Marland Kitchen apixaban (ELIQUIS) 5 MG TABS tablet Take 1 tablet (5 mg total) by mouth 2 (two) times daily. 60 tablet 11  . diltiazem (CARDIZEM CD) 240 MG 24 hr capsule Take 1 capsule (240 mg total) by mouth daily. 30 capsule 11  . furosemide (LASIX) 40 MG tablet Take 1 tablet (40 mg total) by mouth daily. 30 tablet 11  . tamsulosin (FLOMAX) 0.4 MG CAPS capsule Take 1 capsule (0.4 mg total) by mouth daily. 30  capsule 6   No facility-administered medications prior to visit.     Allergies:   Patient has no known allergies.   Social History   Socioeconomic History  . Marital status: Married    Spouse name: Not on file  . Number of children: Not on file  . Years of education: Not on file  . Highest education level: Not on file  Occupational History  . Not on file  Tobacco Use  . Smoking status: Former Smoker    Types: Cigars    Quit date: 05/11/1993    Years since quitting: 26.5  . Smokeless tobacco: Former Systems developer    Types: [place] date: 05/11/1993  Substance and Sexual Activity  . Alcohol use: Not Currently  Comment: social  . Drug use: No  . Sexual activity: Not on file  Other Topics Concern  . Not on file  Social History Narrative  . Not on file   Social Determinants of Health   Financial Resource Strain:   . Difficulty of Paying Living Expenses:   Food Insecurity:   . Worried About Charity fundraiser in the Last Year:   . Arboriculturist in the Last Year:   Transportation Needs:   . Film/video editor (Medical):   Marland Kitchen Lack of Transportation (Non-Medical):   Physical Activity:   . Days of Exercise per Week:   . Minutes of Exercise per Session:   Stress:   . Feeling of Stress :   Social Connections:   . Frequency of Communication with Friends and Family:   . Frequency of Social Gatherings with Friends and Family:   . Attends Religious Services:   . Active Member of Clubs or Organizations:   . Attends Archivist Meetings:   Marland Kitchen Marital Status:      Family History:  The patient's family history includes Alzheimer's disease in his father; Dementia in his sister; Heart disease in his sister; Heart failure in his mother; Stroke in his mother.   ROS:   Please see the history of present illness.    ROS All other systems reviewed and are negative.   PHYSICAL EXAM:   VS:  BP 124/76   Pulse 82   Ht 5\' 11"  (1.803 m)   Wt 268 lb (121.6 kg)   SpO2 96%    BMI 37.38 kg/m     General: Alert, oriented x3, no distress, severely obese.  Well-healed pacemaker site in the left subclavian area. Head: no evidence of trauma, PERRL, EOMI, no exophtalmos or lid lag, no myxedema, no xanthelasma; normal ears, nose and oropharynx Neck: normal jugular venous pulsations and no hepatojugular reflux; brisk carotid pulses without delay and no carotid bruits Chest: clear to auscultation, no signs of consolidation by percussion or palpation, normal fremitus, symmetrical and full respiratory excursions Cardiovascular: normal position and quality of the apical impulse, regular rhythm, normal first and paradoxically split second heart sounds, no murmurs, rubs or gallops Abdomen: no tenderness or distention, no masses by palpation, no abnormal pulsatility or arterial bruits, normal bowel sounds, no hepatosplenomegaly Extremities: no clubbing, cyanosis or edema; 2+ radial, ulnar and brachial pulses bilaterally; 2+ right femoral, posterior tibial and dorsalis pedis pulses; 2+ left femoral, posterior tibial and dorsalis pedis pulses; no subclavian or femoral bruits Neurological: grossly nonfocal Psych: Normal mood and affect   Wt Readings from Last 3 Encounters:  12/05/19 268 lb (121.6 kg)  11/08/19 264 lb (119.7 kg)  10/25/19 270 lb 3.2 oz (122.6 kg)      Studies/Labs Reviewed:   EKG:  EKG is not ordered today.  The ECG from 10/25/2019 as well as today's intracardiac electrogram shows atrial paced, ventricular paced rhythm  ASSESSMENT:    1. Persistent atrial fibrillation ([place])   2. SSS (sick sinus syndrome) ([place])   3. Essential hypertension   4. Class 2 severe obesity due to excess calories with serious comorbidity and body mass index (BMI) of 37.0 to 37.9 in adult [facility])   5. Cardiac pacemaker in situ   6. Chronic diastolic heart failure ([place])   7. OSA (obstructive sleep apnea)   8. Shortness of breath      PLAN:  In order of problems listed  above:  1.  AFib s/p RFA 04/03/2017: Overall with good results, but did have breakthrough atrial fibrillation requiring cardioversion in February 2021.  CHA2DS2-VASc 4 (age 12, CHF, hypertension). 2. SSS: Improvement in fatigue following his device sensor settings adjustment, but still has shortness of breath.  Heart rate histogram distribution appears appropriate. 3. HTN: Blood pressure is well controlled 4. Obesity: Reviewed again the direct relationship between weight and overall burden of atrial arrhythmia. 5. PM: Normal device function, but approaching ERI.  We will check in the office in 3 months. 6. CHF: As far as I can tell he is clinically euvolemic.  He describes worse shortness of breath.  We will check an echocardiogram (last echo performed over 3 years ago and he was in atrial fibrillation which limits assessment of diastolic function). 7. OSA: reports 100% compliance with CPAP and he denies daytime hypersomnolence.  Pulmonary hypertension may be playing a role in his dyspnea.  Review on echo.     Medication Adjustments/Labs and Tests Ordered: Current medicines are reviewed at length with the patient today.  Concerns regarding medicines are outlined above.  Medication changes, Labs and Tests ordered today are listed in the Patient Instructions below. Patient Instructions  Medication Instructions:  No changes *If you need a refill on your cardiac medications before your next appointment, please call your pharmacy*   Lab Work: None ordered If you have labs (blood work) drawn today and your tests are completely normal, you will receive your results only by: Marland Kitchen MyChart Message (if you have MyChart) OR . A paper copy in the mail If you have any lab test that is abnormal or we need to change your treatment, we will call you to review the results.   Testing/Procedures: Your physician has requested that you have an echocardiogram. Echocardiography is a painless test that uses sound  waves to create images of your heart. It provides your doctor with information about the size and shape of your heart and how well your heart's chambers and valves are working. You may receive an ultrasound enhancing agent through an IV if needed to better visualize your heart during the echo.This procedure takes approximately one hour. There are no restrictions for this procedure. This will take place at the [address].     Follow-Up: At [facility], you and your health needs are our priority.  As part of our continuing mission to provide you with exceptional heart care, we have created designated Provider Care Teams.  These Care Teams include your primary Cardiologist (physician) and Advanced Practice Providers (APPs -  Physician Assistants and Nurse Practitioners) who all work together to provide you with the care you need, when you need it.  We recommend signing up for the patient portal called "MyChart".  Sign up information is provided on this After Visit Summary.  MyChart is used to connect with patients for Virtual Visits (Telemedicine).  Patients are able to view lab/test results, encounter notes, upcoming appointments, etc.  Non-urgent messages can be sent to your provider as well.   To learn more about what you can do with MyChart, go to [email or web address].    Your next appointment:   Keep your appointment on 02/13/20 at 10 am with Der. Felicidad Sugarman       Signed, Sanda Klein, MD  12/06/2019 7:06 PM    [address], [place], [place]  [address] Phone: [phone]; Fax: [phone]

## 2019-12-06 ENCOUNTER — Encounter: Payer: Self-pay | Admitting: Cardiovascular Disease

## 2019-12-19 ENCOUNTER — Other Ambulatory Visit: Payer: Self-pay

## 2019-12-19 ENCOUNTER — Other Ambulatory Visit (HOSPITAL_COMMUNITY): Payer: BC Managed Care – PPO

## 2019-12-19 ENCOUNTER — Ambulatory Visit (HOSPITAL_COMMUNITY): Payer: BC Managed Care – PPO | Attending: Cardiovascular Disease

## 2019-12-19 ENCOUNTER — Telehealth: Payer: Self-pay | Admitting: Cardiovascular Disease

## 2019-12-19 DIAGNOSIS — R0602 Shortness of breath: Secondary | ICD-10-CM | POA: Diagnosis not present

## 2019-12-19 MED ORDER — FUROSEMIDE 40 MG PO TABS: 60.0000 mg | ORAL_TABLET | Freq: Every day | ORAL | 3 refills | Status: DC

## 2019-12-19 MED ORDER — DILTIAZEM HCL ER COATED BEADS 120 MG PO CP24: 120.0000 mg | ORAL_CAPSULE | Freq: Every day | ORAL | 3 refills | Status: DC

## 2019-12-19 NOTE — Telephone Encounter (Signed)
New message   Patient is returning call echo results. Please call.

## 2019-12-19 NOTE — Telephone Encounter (Signed)
Patient made aware of results and verbalized understanding.  Heart pumping strength is very slightly worse than in the past. Putting the images side-by-side, the change seems to be primarily (possibly entirely) due to the fact that there is now 100% ventricular pacing (before there was native conduction from the atrium to the ventricle). This causes asynchrony of contraction, with a slight reduction in efficiency of cardiac contraction. There is also echo evidence of increased fluid build-up. I would recommend trying to see if backing off diltiazem helps allow more natural atriventricular conduction. Reduce diltiazem to 120 mg daily. Also increase furosemide to 60 mg daily. I tried to call and explain. Left message on voicemail.

## 2020-01-24 ENCOUNTER — Encounter: Payer: BC Managed Care – PPO | Admitting: Cardiology

## 2020-02-06 ENCOUNTER — Other Ambulatory Visit: Payer: Self-pay

## 2020-02-06 ENCOUNTER — Encounter: Payer: Self-pay | Admitting: Cardiovascular Disease

## 2020-02-06 ENCOUNTER — Ambulatory Visit (INDEPENDENT_AMBULATORY_CARE_PROVIDER_SITE_OTHER): Payer: BC Managed Care – PPO | Admitting: Cardiovascular Disease

## 2020-02-06 VITALS — BP 116/60 | HR 114 | Temp 97.3°F | Ht 71.0 in | Wt 267.2 lb

## 2020-02-06 DIAGNOSIS — I4819 Other persistent atrial fibrillation: Secondary | ICD-10-CM

## 2020-02-06 DIAGNOSIS — I484 Atypical atrial flutter: Secondary | ICD-10-CM

## 2020-02-06 DIAGNOSIS — Z6837 Body mass index (BMI) 37.0-37.9, adult: Secondary | ICD-10-CM

## 2020-02-06 DIAGNOSIS — G4733 Obstructive sleep apnea (adult) (pediatric): Secondary | ICD-10-CM

## 2020-02-06 DIAGNOSIS — I495 Sick sinus syndrome: Secondary | ICD-10-CM | POA: Diagnosis not present

## 2020-02-06 DIAGNOSIS — I10 Essential (primary) hypertension: Secondary | ICD-10-CM

## 2020-02-06 DIAGNOSIS — I5032 Chronic diastolic (congestive) heart failure: Secondary | ICD-10-CM

## 2020-02-06 DIAGNOSIS — Z950 Presence of cardiac pacemaker: Secondary | ICD-10-CM

## 2020-02-06 MED ORDER — POTASSIUM CHLORIDE CRYS ER 20 MEQ PO TBCR: 20.0000 meq | EXTENDED_RELEASE_TABLET | Freq: Every day | ORAL | 3 refills | Status: DC

## 2020-02-06 NOTE — Progress Notes (Signed)
Patient ID: Shane [name], male   DOB: 28-Oct-1945, 74 y.o.   MRN: [id number]     Cardiology Office Note    Date:  02/06/2020   [name], Shane [name], MRN [id number]  PCP:  Albina Billet, MD  Cardiologist:   Sanda Klein, MD   Chief Complaint  Patient presents with  . Irregular Heart Beat    History of Present Illness:  Shane [name] is a 74 y.o. male who presents for persistent atrial fibrillation and pacemaker check.  He has a history of tachycardia-bradycardia syndrome, chronic diastolic heart failure (last echo April 2021 shows mildly depressed LVEF 45-50%), morbid obesity, obstructive sleep apnea on CPAP.  He underwent A. fib radiofrequency ablation on April 03, 2017.  He developed recurrent atrial fibrillation in February 2021 with a successful cardioversion on February 19.    He presents today with some reduction in exercise capacity.  He does not have any palpitations but pacemaker interrogation shows that he is in atrial flutter with 2: 1 AV block.  The atrial cycle length is long at about 520 ms.  The ventricular rate is 116 bpm.  It is hard to say when the current episode began, since it is ongoing.  Counters showed that he has 85% atrial pacing and only 1% ATR mode switch.  He has 98% ventricular pacing (we reduce his dose of diltiazem at his previous appointment to try to see if we could reduce the need for ventricular pacing).  Overdrive pacing was attempted via his atrial lead today, unsuccessfully.  Gradually faster burst atrial pacing was employed.  At higher rates (cycle length less than 250 ms) it appeared that we were in training be arrhythmia circuit, but after multiple attempts the end result was conversion to atrial fibrillation, ventricular rate control remained good at about 80 bpm.  There was no clear change in his symptoms.  He denies dizziness, syncope, chest discomfort, leg edema, orthopnea, PND focal neurological complaints, falls, injuries or  serious bleeding problems.  He does complain of some muscle cramps when driving.  Anticipated pacemaker generator longevity is less than 6 months.  His device is not capable of remote downloads.  His device is not capable of remote downloads.  Potassium was 3.8 at last labs checked in February 2021.  He is on a daily diuretic.  Past Medical History:  Diagnosis Date  . Atrioventricular block   . Degenerative joint disease   . Gout   . Paroxysmal atrial fibrillation (HCC)   . Presence of permanent cardiac pacemaker 03/12/10   guidant  ([place])  . Sinus node dysfunction (HCC)   . Sleep apnea    CPAP  . Wears hearing aid in both ears     Past Surgical History:  Procedure Laterality Date  . ATRIAL FIBRILLATION ABLATION N/A 04/03/2017   Procedure: Atrial Fibrillation Ablation;  Surgeon: Constance Haw, MD;  Location: [place] [facility];  Service: Cardiovascular;  Laterality: N/A;  . CARDIAC CATHETERIZATION  10/13/1994   No CAD  . CARDIAC CATHETERIZATION  05/10/2003   No CAD  . CARDIOVERSION N/A 10/21/2016   Procedure: CARDIOVERSION;  Surgeon: Sanda Klein, MD;  Location[facility] ENDOSCOPY;  Service: Cardiovascular;  Laterality: N/A;  . CARDIOVERSION N/A 05/15/2017   Procedure: CARDIOVERSION;  Surgeon: Fay Records, MD;  Location: [facility] ENDOSCOPY;  Service: Cardiovascular;  Laterality: N/A;  . CARDIOVERSION N/A 10/21/2019   Procedure: CARDIOVERSION;  Surgeon: Sanda Klein, MD;  Location: [place];  Service: Cardiovascular;  Laterality: N/A;  . CATARACT EXTRACTION W/PHACO Left 05/18/2019   Procedure: CATARACT EXTRACTION PHACO AND INTRAOCULAR LENS PLACEMENT (IOC) LEFT  1:21 11.0% 9.01;  Surgeon: Leandrew Koyanagi, MD;  Location: [place];  Service: Ophthalmology;  Laterality: Left;  sleep apnea  . CATARACT EXTRACTION W/PHACO Right 06/29/2019   Procedure: CATARACT EXTRACTION PHACO AND INTRAOCULAR LENS PLACEMENT (IOC) RIGHT  01:14.2  14.5%  10.92;  Surgeon:  Leandrew Koyanagi, MD;  Location: [place];  Service: Ophthalmology;  Laterality: Right;  sleep apnea  . CYST EXCISION  03/2019   on back   . NM MYOCAR PERF WALL MOTION  05/07/2011   Lexiscan: No ishcemia  . PERMANENT PACEMAKER INSERTION  03/12/2010   guidant  . ROTATOR CUFF REPAIR  1996  . SHOULDER ARTHROSCOPY    . US ECHOCARDIOGRAPHY  05/07/2011   mod. LVH,LA mod. dilated,borderline aortic root dilatation    Outpatient Medications Prior to Visit  Medication Sig Dispense Refill  . allopurinol (ZYLOPRIM) 300 MG tablet Take 1 tablet (300 mg total) by mouth daily. 30 tablet 2  . anastrozole (ARIMIDEX) 1 MG tablet Take 1 mg by mouth daily.     Marland Kitchen apixaban (ELIQUIS) 5 MG TABS tablet Take 1 tablet (5 mg total) by mouth 2 (two) times daily. 60 tablet 11  . Artificial Tear Ointment (DRY EYES OP) Place 1 drop into both eyes daily as needed (dry eyes).    . Ascorbic Acid (VITAMIN C) 1000 MG tablet Take 1,000 mg by mouth daily.    . Cholecalciferol (VITAMIN D-3) 1000 UNITS CAPS Take 1,000 Units by mouth daily.     . citalopram (CELEXA) 40 MG tablet Take 1 tablet (40 mg total) by mouth daily. MUST RECEIVE FUTURE REFILLS FROM PCP. (Patient taking differently: Take 40 mg by mouth daily. ) 30 tablet 0  . co-enzyme Q-10 50 MG capsule Take 50 mg by mouth daily.     . colchicine 0.6 MG tablet Take 0.5 tablets (0.3 mg total) by mouth daily as needed. (Patient taking differently: Take 0.3 mg by mouth daily as needed (gout). ) 30 tablet 2  . diltiazem (CARDIZEM CD) 120 MG 24 hr capsule Take 1 capsule (120 mg total) by mouth daily. 30 capsule 3  . furosemide (LASIX) 40 MG tablet Take 1.5 tablets (60 mg total) by mouth daily. 45 tablet 3  . montelukast (SINGULAIR) 10 MG tablet Take 10 mg by mouth at bedtime.    . Multiple Vitamins-Minerals (ZINC PO) Take 50 mg by mouth daily. Silver adult ober 14    . Omega-3 Fatty Acids (FISH OIL) 1200 MG CAPS Take 1,200 mg by mouth 2 (two) times daily.     Marland Kitchen OVER  THE COUNTER MEDICATION Take 100 mg by mouth daily. CBD oil    . psyllium (HYDROCIL/METAMUCIL) 95 % PACK Take 1 packet by mouth at bedtime.    . pyridOXINE (VITAMIN B-6) 100 MG tablet Take 100 mg by mouth daily.    . tamsulosin (FLOMAX) 0.4 MG CAPS capsule Take 1 capsule (0.4 mg total) by mouth daily. 30 capsule 2  . Testosterone 1.62 % GEL Apply 3 Pump topically daily. Shoulders  1  . vitamin B-12 (CYANOCOBALAMIN) 1000 MCG tablet Take 1,000 mcg by mouth daily.    Marland Kitchen zinc gluconate 50 MG tablet Take 50 mg by mouth daily.     No facility-administered medications prior to visit.     Allergies:   Patient has no known allergies.   Social History  Socioeconomic History  . Marital status: Married    Spouse name: Not on file  . Number of children: Not on file  . Years of education: Not on file  . Highest education level: Not on file  Occupational History  . Not on file  Tobacco Use  . Smoking status: Former Smoker    Types: Cigars    Quit date: 05/11/1993    Years since quitting: 26.7  . Smokeless tobacco: Former Systems developer    Types: [place] date: 05/11/1993  Substance and Sexual Activity  . Alcohol use: Not Currently    Comment: social  . Drug use: No  . Sexual activity: Not on file  Other Topics Concern  . Not on file  Social History Narrative  . Not on file   Social Determinants of Health   Financial Resource Strain:   . Difficulty of Paying Living Expenses:   Food Insecurity:   . Worried About Charity fundraiser in the Last Year:   . Arboriculturist in the Last Year:   Transportation Needs:   . Film/video editor (Medical):   Marland Kitchen Lack of Transportation (Non-Medical):   Physical Activity:   . Days of Exercise per Week:   . Minutes of Exercise per Session:   Stress:   . Feeling of Stress :   Social Connections:   . Frequency of Communication with Friends and Family:   . Frequency of Social Gatherings with Friends and Family:   . Attends Religious Services:   .  Active Member of Clubs or Organizations:   . Attends Archivist Meetings:   Marland Kitchen Marital Status:      Family History:  The patient's family history includes Alzheimer's disease in his father; Dementia in his sister; Heart disease in his sister; Heart failure in his mother; Stroke in his mother.   ROS:   Please see the history of present illness.    ROS All other systems reviewed and are negative.   PHYSICAL EXAM:   VS:  BP 116/60   Pulse (!) 114   Temp (!) 97.3 F (36.3 C)   Ht 5\' 11"  (1.803 m)   Wt 267 lb 3.2 oz (121.2 kg)   BMI 37.27 kg/m     General: Alert, oriented x3, no distress, Severely obese.  Healthy pacemaker site. Head: no evidence of trauma, PERRL, EOMI, no exophtalmos or lid lag, no myxedema, no xanthelasma; normal ears, nose and oropharynx Neck: normal jugular venous pulsations and no hepatojugular reflux; brisk carotid pulses without delay and no carotid bruits Chest: clear to auscultation, no signs of consolidation by percussion or palpation, normal fremitus, symmetrical and full respiratory excursions Cardiovascular: normal position and quality of the apical impulse, regular rhythm, normal first and paradoxically split second heart sounds, no murmurs, rubs or gallops Abdomen: no tenderness or distention, no masses by palpation, no abnormal pulsatility or arterial bruits, normal bowel sounds, no hepatosplenomegaly Extremities: no clubbing, cyanosis or edema; 2+ radial, ulnar and brachial pulses bilaterally; 2+ right femoral, posterior tibial and dorsalis pedis pulses; 2+ left femoral, posterior tibial and dorsalis pedis pulses; no subclavian or femoral bruits Neurological: grossly nonfocal Psych: Normal mood and affect   Wt Readings from Last 3 Encounters:  02/06/20 267 lb 3.2 oz (121.2 kg)  12/05/19 268 lb (121.6 kg)  11/08/19 264 lb (119.7 kg)      Studies/Labs Reviewed:   EKG:  EKG is not ordered today.  The ECG from 10/25/2019  as well as today's  intracardiac electrogram shows atrial paced, ventricular paced rhythm  ASSESSMENT:    1. Persistent atrial fibrillation ([place])   2. Atypical atrial flutter (HCC)   3. SSS (sick sinus syndrome) ([place])   4. Essential hypertension   5. Class 2 severe obesity due to excess calories with serious comorbidity and body mass index (BMI) of 37.0 to 37.9 in adult ([place])   6. Presence of cardiac pacemaker   7. Chronic diastolic heart failure ([place])   8. OSA (obstructive sleep apnea)      PLAN:  In order of problems listed above:  1.   AFib s/p RFA 04/03/2017: Overall with good results, but presented today with atrial flutter with 2: 1 AV block (suspect atypical/left atrial flutter).  Overdrive pacing was unsuccessful in the office today.  The final rhythm was atrial fibrillation with controlled ventricular response.  He previously had breakthrough atrial fibrillation requiring cardioversion in February 2021.  CHA2DS2-VASc 4 (age 40, CHF, hypertension).  He has had uninterrupted anticoagulation.  We will bring him back to the A. fib clinic later this week.  If still in atrial fibrillation, we will schedule for cardioversion.  He is planning a trip to [place], [place] next week, which I do not think he needs to cancel since he is not symptomatic.  We can schedule him for cardioversion when he returns. 2. SSS: He did have improvement in fatigue following his device sensor settings adjustment at his last appointment.  Hard to judge how things are going now due to the recurrent arrhythmia.Marland Kitchen  Heart rate histogram distribution appears appropriate. 3. HTN: Excellent control 4. Obesity: Made him aware of the direct relationship between arrhythmia burden and weight. 5. PM: Device is approaching ERI.  Will need to be checked at least every 3 months.  His device is not capable of remote monitoring. 6. CHF: He does not have any overt physical findings to suggest hypervolemia.  His echocardiogram shows slight reduction  in LVEF to 45-50%, and it would be highly desirable to avoid right ventricular pacing.  For this reason reduce the dose of diltiazem at his last appointment, but he still has a high burden of V pacing.  The recurrent arrhythmia further complicates adjustment of his medications. 7. OSA: reports 100% compliance with CPAP and he denies daytime hypersomnolence.  We were unable to estimate PA pressure on his last echo.     Medication Adjustments/Labs and Tests Ordered: Current medicines are reviewed at length with the patient today.  Concerns regarding medicines are outlined above.  Medication changes, Labs and Tests ordered today are listed in the Patient Instructions below. Patient Instructions  Medication Instructions:  START POTASSIUM CHLORIDE 20 MEQ ONCE DAILY *If you need a refill on your cardiac medications before your next appointment, please call your pharmacy*   Lab Work: If you have labs (blood work) drawn today and your tests are completely normal, you will receive your results only by: Marland Kitchen MyChart Message (if you have MyChart) OR . A paper copy in the mail If you have any lab test that is abnormal or we need to change your treatment, we will call you to review the results.   Follow-Up: At [facility], you and your health needs are our priority.  As part of our continuing mission to provide you with exceptional heart care, we have created designated Provider Care Teams.  These Care Teams include your primary Cardiologist (physician) and Advanced Practice Providers (APPs -  Physician Assistants  and Nurse Practitioners) who all work together to provide you with the care you need, when you need it.  We recommend signing up for the patient portal called "MyChart".  Sign up information is provided on this After Visit Summary.  MyChart is used to connect with patients for Virtual Visits (Telemedicine).  Patients are able to view lab/test results, encounter notes, upcoming appointments, etc.   Non-urgent messages can be sent to your provider as well.   To learn more about what you can do with MyChart, go to [email or web address].    Your next appointment:   2 month(s)  The format for your next appointment:   In Person  Provider:   Sanda Klein, MD PACER CHECK   Other Instructions APPOINTMENT WITH THE ATRIAL FIB CLINIC THIS WEEK      Signed, Sanda Klein, MD  02/06/2020 4:09 PM    [facility] [address], [place], [place]  [address] Phone: [phone]; Fax: [phone]

## 2020-02-06 NOTE — Patient Instructions (Addendum)
Medication Instructions:  START POTASSIUM CHLORIDE 20 MEQ ONCE DAILY *If you need a refill on your cardiac medications before your next appointment, please call your pharmacy*   Lab Work: If you have labs (blood work) drawn today and your tests are completely normal, you will receive your results only by: Marland Kitchen MyChart Message (if you have MyChart) OR . A paper copy in the mail If you have any lab test that is abnormal or we need to change your treatment, we will call you to review the results.   Follow-Up: At [facility], you and your health needs are our priority.  As part of our continuing mission to provide you with exceptional heart care, we have created designated Provider Care Teams.  These Care Teams include your primary Cardiologist (physician) and Advanced Practice Providers (APPs -  Physician Assistants and Nurse Practitioners) who all work together to provide you with the care you need, when you need it.  We recommend signing up for the patient portal called "MyChart".  Sign up information is provided on this After Visit Summary.  MyChart is used to connect with patients for Virtual Visits (Telemedicine).  Patients are able to view lab/test results, encounter notes, upcoming appointments, etc.  Non-urgent messages can be sent to your provider as well.   To learn more about what you can do with MyChart, go to [email or web address].    Your next appointment:   2 month(s)  The format for your next appointment:   In Person  Provider:   Sanda Klein, MD PACER CHECK   Other Instructions [place]

## 2020-02-08 ENCOUNTER — Other Ambulatory Visit: Payer: Self-pay

## 2020-02-08 ENCOUNTER — Encounter (HOSPITAL_COMMUNITY): Payer: Self-pay | Admitting: Nurse Practitioner

## 2020-02-08 ENCOUNTER — Ambulatory Visit (HOSPITAL_COMMUNITY)
Admission: RE | Admit: 2020-02-08 | Discharge: 2020-02-08 | Disposition: A | Discharge status: Home / Self Care | Payer: BC Managed Care – PPO | Source: Ambulatory Visit | Attending: Nurse Practitioner | Admitting: Nurse Practitioner

## 2020-02-08 VITALS — BP 114/76 | HR 118 | Ht 71.0 in | Wt 268.6 lb

## 2020-02-08 DIAGNOSIS — Z87891 Personal history of nicotine dependence: Secondary | ICD-10-CM | POA: Insufficient documentation

## 2020-02-08 DIAGNOSIS — G4733 Obstructive sleep apnea (adult) (pediatric): Secondary | ICD-10-CM | POA: Diagnosis not present

## 2020-02-08 DIAGNOSIS — Z01818 Encounter for other preprocedural examination: Secondary | ICD-10-CM | POA: Insufficient documentation

## 2020-02-08 DIAGNOSIS — I4891 Unspecified atrial fibrillation: Secondary | ICD-10-CM | POA: Insufficient documentation

## 2020-02-08 DIAGNOSIS — Z7901 Long term (current) use of anticoagulants: Secondary | ICD-10-CM | POA: Insufficient documentation

## 2020-02-08 DIAGNOSIS — I4819 Other persistent atrial fibrillation: Secondary | ICD-10-CM | POA: Diagnosis not present

## 2020-02-08 DIAGNOSIS — M109 Gout, unspecified: Secondary | ICD-10-CM | POA: Insufficient documentation

## 2020-02-08 DIAGNOSIS — Z79899 Other long term (current) drug therapy: Secondary | ICD-10-CM | POA: Diagnosis not present

## 2020-02-08 DIAGNOSIS — Z20822 Contact with and (suspected) exposure to covid-19: Secondary | ICD-10-CM | POA: Insufficient documentation

## 2020-02-08 DIAGNOSIS — D6869 Other thrombophilia: Secondary | ICD-10-CM

## 2020-02-08 NOTE — Patient Instructions (Signed)
Cardioversion scheduled for Friday, June 25th  - Arrive at the [facility] and go to admitting at [facility] not eat or drink anything after midnight the night prior to your procedure.  - Take all your morning medication (except diabetic medications) with a sip of water prior to arrival.  - You will not be able to drive home after your procedure.  - Do NOT miss any doses of your blood thinner - if you should miss a dose please notify our office immediately.

## 2020-02-08 NOTE — Progress Notes (Signed)
Thanks, Butch Penny

## 2020-02-08 NOTE — H&P (View-Only) (Signed)
Primary Care Physician: Albina Billet, MD Referring Physician: Dr. 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 male with a h/o paroxysmal afib, s/p ablation in 2018 and being seen in f/u in afib clinic for return to atrial flutter. He was seen in the office by Dr. Loletha Grayer and he attempted to pace him out which was unsuccessful. On f/u here if he was still in out of rhythm, cardioversion was to be scheduled. Ekg shows atrial flutter at 118 bpm. .  He continues on eliquis 5 mg bid for CHA2DS2VASc score of 4. He is mildly symptomatic.  Today, he denies symptoms of palpitations, chest pain,+ shortness of breath, orthopnea, PND, lower extremity edema, dizziness, presyncope, syncope, or neurologic sequela.+ fatigue. The patient is tolerating medications without difficulties and is otherwise without complaint today.   Past Medical History:  Diagnosis Date  . Atrioventricular block   . Degenerative joint disease   . Gout   . Paroxysmal atrial fibrillation (HCC)   . 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 Sinus node dysfunction (HCC)   . Sleep apnea    CPAP  . 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 ATRIAL FIBRILLATION ABLATION N/A 04/03/2017   Procedure: Atrial Fibrillation Ablation;  Surgeon: Constance Haw, MD;  Location: [facility];  Service: Cardiovascular;  Laterality: N/A;  . CARDIAC CATHETERIZATION  10/13/1994   No CAD  . CARDIAC CATHETERIZATION  05/10/2003   No CAD  . CARDIOVERSION N/A 10/21/2016   Procedure: CARDIOVERSION;  Surgeon: Sanda Klein, MD;  Location: [facility];  Service: Cardiovascular;  Laterality: N/A;  . CARDIOVERSION N/A 05/15/2017   Procedure: CARDIOVERSION;  Surgeon: Fay Records, MD;  Location: [facility];  Service: Cardiovascular;  Laterality: N/A;  . CARDIOVERSION N/A 10/21/2019   Procedure: CARDIOVERSION;  Surgeon: Sanda Klein, MD;   Location: [facility];  Service: Cardiovascular;  Laterality: N/A;  . CATARACT EXTRACTION W/PHACO Left 05/18/2019   Procedure: CATARACT EXTRACTION PHACO AND INTRAOCULAR LENS PLACEMENT ([place]) LEFT  1:21 11.0% 9.01;  Surgeon: Leandrew Koyanagi, MD;  Location: [facility];  Service: Ophthalmology;  Laterality: Left;  sleep apnea  . CATARACT EXTRACTION W/PHACO Right 06/29/2019   Procedure: CATARACT EXTRACTION PHACO AND INTRAOCULAR LENS PLACEMENT (IOC) RIGHT  01:14.2  14.5%  10.92;  Surgeon: Leandrew Koyanagi, MD;  Location: [facility];  Service: Ophthalmology;  Laterality: Right;  sleep apnea  . CYST EXCISION  03/2019   on back   . NM MYOCAR PERF WALL MOTION  05/07/2011   Lexiscan: No ishcemia  . PERMANENT PACEMAKER INSERTION  03/12/2010   guidant  . ROTATOR CUFF REPAIR  1996  . SHOULDER ARTHROSCOPY    . US ECHOCARDIOGRAPHY  05/07/2011   mod. LVH,LA mod. dilated,borderline aortic root dilatation    Current Outpatient Medications  Medication Sig Dispense Refill  . allopurinol (ZYLOPRIM) 300 MG tablet Take 1 tablet (300 mg total) by mouth daily. 30 tablet 2  . anastrozole (ARIMIDEX) 1 MG tablet Take 1 mg by mouth daily.     Marland Kitchen apixaban (ELIQUIS) 5 MG TABS tablet Take 1 tablet (5 mg total) by mouth 2 (two) times daily. 60 tablet 11  . Artificial Tear Ointment (DRY EYES OP) Place 1 drop into both eyes daily as needed (dry eyes).    . Ascorbic Acid (VITAMIN C) 1000 MG tablet Take 1,000 mg by mouth  daily.    . Cholecalciferol (VITAMIN D-3) 1000 UNITS CAPS Take 1,000 Units by mouth daily.     . citalopram (CELEXA) 40 MG tablet Take 1 tablet (40 mg total) by mouth daily. MUST RECEIVE FUTURE REFILLS FROM PCP. (Patient taking differently: Take 40 mg by mouth daily. ) 30 tablet 0  . co-enzyme Q-10 50 MG capsule Take 50 mg by mouth daily.     . colchicine 0.6 MG tablet Take 0.5 tablets (0.3 mg total) by mouth daily as needed. (Patient taking differently: Take 0.3 mg by mouth daily as  needed (gout). ) 30 tablet 2  . diltiazem (CARDIZEM CD) 120 MG 24 hr capsule Take 1 capsule (120 mg total) by mouth daily. 30 capsule 3  . furosemide (LASIX) 40 MG tablet Take 1.5 tablets (60 mg total) by mouth daily. 45 tablet 3  . montelukast (SINGULAIR) 10 MG tablet Take 10 mg by mouth at bedtime.    . Omega-3 Fatty Acids (FISH OIL) 1200 MG CAPS Take 1,200 mg by mouth 2 (two) times daily.     Marland Kitchen OVER THE COUNTER MEDICATION Take 100 mg by mouth daily. CBD oil    . potassium chloride SA (KLOR-CON) 20 MEQ tablet Take 1 tablet (20 mEq total) by mouth daily. 90 tablet 3  . psyllium (HYDROCIL/METAMUCIL) 95 % PACK Take 1 packet by mouth at bedtime.    . pyridOXINE (VITAMIN B-6) 100 MG tablet Take 100 mg by mouth daily.    . tamsulosin (FLOMAX) 0.4 MG CAPS capsule Take 1 capsule (0.4 mg total) by mouth daily. 30 capsule 2  . Testosterone 1.62 % GEL Apply 3 Pump topically daily. Shoulders  1  . vitamin B-12 (CYANOCOBALAMIN) 1000 MCG tablet Take 1,000 mcg by mouth daily.    Marland Kitchen zinc gluconate 50 MG tablet Take 50 mg by mouth daily.    . Multiple Vitamins-Minerals (ZINC PO) Take 50 mg by mouth daily. Silver adult ober 50     No current facility-administered medications for this encounter.    No Known Allergies  Social History   Socioeconomic History  . Marital status: Married    Spouse name: Not on file  . Number of children: Not on file  . Years of education: Not on file  . Highest education level: Not on file  Occupational History  . Not on file  Tobacco Use  . Smoking status: Former Smoker    Types: Cigars    Quit date: 05/11/1993    Years since quitting: 26.7  . Smokeless tobacco: Former Systems developer    Types: [place] date: 05/11/1993  Substance and Sexual Activity  . Alcohol use: Not Currently    Comment: social  . Drug use: No  . Sexual activity: Not on file  Other Topics Concern  . Not on file  Social History Narrative  . Not on file   Social Determinants of Health    Financial Resource Strain:   . Difficulty of Paying Living Expenses:   Food Insecurity:   . Worried About Charity fundraiser in the Last Year:   . Arboriculturist in the Last Year:   Transportation Needs:   . Film/video editor (Medical):   Marland Kitchen Lack of Transportation (Non-Medical):   Physical Activity:   . Days of Exercise per Week:   . Minutes of Exercise per Session:   Stress:   . Feeling of Stress :   Social Connections:   . Frequency of Communication with  Friends and Family:   . Frequency of Social Gatherings with Friends and Family:   . Attends Religious Services:   . Active Member of Clubs or Organizations:   . Attends Archivist Meetings:   Marland Kitchen Marital Status:   Intimate Partner Violence:   . Fear of Current or Ex-Partner:   . Emotionally Abused:   Marland Kitchen Physically Abused:   . Sexually Abused:     Family History  Problem Relation Age of Onset  . Stroke Mother   . Heart failure Mother   . Alzheimer's disease Father   . Heart disease Sister   . Dementia Sister     ROS- All systems are reviewed and negative except as per the HPI above  Physical Exam: Vitals:   02/08/20 1132  BP: 114/76  Pulse: (!) 118  Weight: 121.8 kg  Height: 5\' 11"  (1.803 m)   Wt Readings from Last 3 Encounters:  02/08/20 121.8 kg  02/06/20 121.2 kg  12/05/19 121.6 kg    Labs: Lab Results  Component Value Date   NA 139 10/21/2019   K 3.8 10/21/2019   CL 102 10/21/2019   CO2 31 10/04/2019   GLUCOSE 97 10/21/2019   BUN 14 10/21/2019   CREATININE 0.90 10/21/2019   CALCIUM 9.5 10/04/2019   MG 2.0 04/22/2017   Lab Results  Component Value Date   INR 1.1 10/14/2016   No results found for: CHOL, HDL, LDLCALC, TRIG   GEN- The patient is well appearing, alert and oriented x 3 today.   Head- normocephalic, atraumatic Eyes-  Sclera clear, conjunctiva pink Ears- hearing intact Oropharynx- clear Neck- supple, no JVP Lymph- no cervical lymphadenopathy Lungs- Clear to  ausculation bilaterally, normal work of breathing Heart- Rapid regular rate and rhythm, no murmurs, rubs or gallops, PMI not laterally displaced GI- soft, NT, ND, + BS Extremities- no clubbing, cyanosis, or edema MS- no significant deformity or atrophy Skin- no rash or lesion Psych- euthymic mood, full affect Neuro- strength and sensation are intact  EKG- atrial flutter at 118 bpm     Assessment and Plan: 1. Afib  S/p ablation in 2018  Recent successful cardioversion in February  2021  Continue  eliquis 5 mg bid for CHA2DS2VASc score of 4 Recent over pacing attempt  by Dr. Loletha Grayer unsuccessful Will be set up for cardioversion   If eraf after this next cardioversion, may need to discuss antiarrythmic's   2. CHA2DS2VASc score of 4 Continue  eiquis 5 mg bid, states no missed doses   Pt plans to go out of town next  week so this will be scheduled for 6/21 F/u here in one week   Butch Penny C. Delta Deshmukh, [facility] [address] [place], [place] [address] [phone]

## 2020-02-08 NOTE — Progress Notes (Addendum)
Primary Care Physician: Albina Billet, MD Referring Physician: Dr. 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 He was seen in the office by Dr. Loletha [name] and he attempted to pace him out which was unsuccessful. On f/u here if he was still in out of rhythm, cardioversion was to be scheduled. Ekg shows atrial flutter at 118 bpm. .  He continues on eliquis 5 mg bid for CHA2DS2VASc score of 4. He is mildly symptomatic.  Today, he denies symptoms of palpitations, chest pain,+ shortness of breath, orthopnea, PND, lower extremity edema, dizziness, presyncope, syncope, or neurologic sequela.+ fatigue. The patient is tolerating medications without difficulties and is otherwise without complaint today.   Past Medical History:  Diagnosis Date  . Atrioventricular block   . Degenerative joint disease   . Gout   . Paroxysmal atrial fibrillation (HCC)   . 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 Sinus node dysfunction (HCC)   . Sleep apnea    CPAP  . 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 ATRIAL FIBRILLATION ABLATION N/A 04/03/2017   Procedure: Atrial Fibrillation Ablation;  Surgeon: Constance Haw, MD;  Location: [facility];  Service: Cardiovascular;  Laterality: N/A;  . CARDIAC CATHETERIZATION  10/13/1994   No CAD  . CARDIAC CATHETERIZATION  05/10/2003   No CAD  . CARDIOVERSION N/A 10/21/2016   Procedure: CARDIOVERSION;  Surgeon: Sanda Klein, MD;  Location: [facility];  Service: Cardiovascular;  Laterality: N/A;  . CARDIOVERSION N/A 05/15/2017   Procedure: CARDIOVERSION;  Surgeon: Fay Records, MD;  Location: [facility]COPY;  Service: Cardiovascular;  Laterality: N/A;  . CARDIOVERSION N/A 10/21/2019   Procedure: CARDIOVERSION;  Surgeon: Sanda Klein, MD;   Location: [facility];  Service: Cardiovascular;  Laterality: N/A;  . CATARACT EXTRACTION W/PHACO Left 05/18/2019   Procedure: CATARACT EXTRACTION PHACO AND INTRAOCULAR LENS PLACEMENT ([place]) LEFT  1:21 11.0% 9.01;  Surgeon: Leandrew Koyanagi, MD;  Location: [facility];  Service: Ophthalmology;  Laterality: Left;  sleep apnea  . CATARACT EXTRACTION W/PHACO Right 06/29/2019   Procedure: CATARACT EXTRACTION PHACO AND INTRAOCULAR LENS PLACEMENT (IOC) RIGHT  01:14.2  14.5%  10.92;  Surgeon: Leandrew Koyanagi, MD;  Location: [facility];  Service: Ophthalmology;  Laterality: Right;  sleep apnea  . CYST EXCISION  03/2019   on back   . NM MYOCAR PERF WALL MOTION  05/07/2011   Lexiscan: No ishcemia  . PERMANENT PACEMAKER INSERTION  03/12/2010   guidant  . ROTATOR CUFF REPAIR  1996  . SHOULDER ARTHROSCOPY    . US ECHOCARDIOGRAPHY  05/07/2011   mod. LVH,LA mod. dilated,borderline aortic root dilatation    Current Outpatient Medications  Medication Sig Dispense Refill  . allopurinol (ZYLOPRIM) 300 MG tablet Take 1 tablet (300 mg total) by mouth daily. 30 tablet 2  . anastrozole (ARIMIDEX) 1 MG tablet Take 1 mg by mouth daily.     Marland Kitchen apixaban (ELIQUIS) 5 MG TABS tablet Take 1 tablet (5 mg total) by mouth 2 (two) times daily. 60 tablet 11  . Artificial Tear Ointment (DRY EYES OP) Place 1 drop into both eyes daily as needed (dry eyes).    . Ascorbic Acid (VITAMIN C) 1000 MG tablet Take 1,000 mg by mouth  daily.    . Cholecalciferol (VITAMIN D-3) 1000 UNITS CAPS Take 1,000 Units by mouth daily.     . citalopram (CELEXA) 40 MG tablet Take 1 tablet (40 mg total) by mouth daily. MUST RECEIVE FUTURE REFILLS FROM PCP. (Patient taking differently: Take 40 mg by mouth daily. ) 30 tablet 0  . co-enzyme Q-10 50 MG capsule Take 50 mg by mouth daily.     . colchicine 0.6 MG tablet Take 0.5 tablets (0.3 mg total) by mouth daily as needed. (Patient taking differently: Take 0.3 mg by mouth daily as  needed (gout). ) 30 tablet 2  . diltiazem (CARDIZEM CD) 120 MG 24 hr capsule Take 1 capsule (120 mg total) by mouth daily. 30 capsule 3  . furosemide (LASIX) 40 MG tablet Take 1.5 tablets (60 mg total) by mouth daily. 45 tablet 3  . montelukast (SINGULAIR) 10 MG tablet Take 10 mg by mouth at bedtime.    . Omega-3 Fatty Acids (FISH OIL) 1200 MG CAPS Take 1,200 mg by mouth 2 (two) times daily.     Marland Kitchen OVER THE COUNTER MEDICATION Take 100 mg by mouth daily. CBD oil    . potassium chloride SA (KLOR-CON) 20 MEQ tablet Take 1 tablet (20 mEq total) by mouth daily. 90 tablet 3  . psyllium (HYDROCIL/METAMUCIL) 95 % PACK Take 1 packet by mouth at bedtime.    . pyridOXINE (VITAMIN B-6) 100 MG tablet Take 100 mg by mouth daily.    . tamsulosin (FLOMAX) 0.4 MG CAPS capsule Take 1 capsule (0.4 mg total) by mouth daily. 30 capsule 2  . Testosterone 1.62 % GEL Apply 3 Pump topically daily. Shoulders  1  . vitamin B-12 (CYANOCOBALAMIN) 1000 MCG tablet Take 1,000 mcg by mouth daily.    Marland Kitchen zinc gluconate 50 MG tablet Take 50 mg by mouth daily.    . Multiple Vitamins-Minerals (ZINC PO) Take 50 mg by mouth daily. Silver adult ober 50     No current facility-administered medications for this encounter.    No Known Allergies  Social History   Socioeconomic History  . Marital status: Married    Spouse name: Not on file  . Number of children: Not on file  . Years of education: Not on file  . Highest education level: Not on file  Occupational History  . Not on file  Tobacco Use  . Smoking status: Former Smoker    Types: Cigars    Quit date: 05/11/1993    Years since quitting: 26.7  . Smokeless tobacco: Former Systems developer    Types: [place] date: 05/11/1993  Substance and Sexual Activity  . Alcohol use: Not Currently    Comment: social  . Drug use: No  . Sexual activity: Not on file  Other Topics Concern  . Not on file  Social History Narrative  . Not on file   Social Determinants of Health    Financial Resource Strain:   . Difficulty of Paying Living Expenses:   Food Insecurity:   . Worried About Charity fundraiser in the Last Year:   . Arboriculturist in the Last Year:   Transportation Needs:   . Film/video editor (Medical):   Marland Kitchen Lack of Transportation (Non-Medical):   Physical Activity:   . Days of Exercise per Week:   . Minutes of Exercise per Session:   Stress:   . Feeling of Stress :   Social Connections:   . Frequency of Communication with  Friends and Family:   . Frequency of Social Gatherings with Friends and Family:   . Attends Religious Services:   . Active Member of Clubs or Organizations:   . Attends Archivist Meetings:   Marland Kitchen Marital Status:   Intimate Partner Violence:   . Fear of Current or Ex-Partner:   . Emotionally Abused:   Marland Kitchen Physically Abused:   . Sexually Abused:     Family History  Problem Relation Age of Onset  . Stroke Mother   . Heart failure Mother   . Alzheimer's disease Father   . Heart disease Sister   . Dementia Sister     ROS- All systems are reviewed and negative except as per the HPI above  Physical Exam: Vitals:   02/08/20 1132  BP: 114/76  Pulse: (!) 118  Weight: 121.8 kg  Height: 5\' 11"  (1.803 m)   Wt Readings from Last 3 Encounters:  02/08/20 121.8 kg  02/06/20 121.2 kg  12/05/19 121.6 kg    Labs: Lab Results  Component Value Date   NA 139 10/21/2019   K 3.8 10/21/2019   CL 102 10/21/2019   CO2 31 10/04/2019   GLUCOSE 97 10/21/2019   BUN 14 10/21/2019   CREATININE 0.90 10/21/2019   CALCIUM 9.5 10/04/2019   MG 2.0 04/22/2017   Lab Results  Component Value Date   INR 1.1 10/14/2016   No results found for: CHOL, HDL, LDLCALC, TRIG   GEN- The patient is well appearing, alert and oriented x 3 today.   Head- normocephalic, atraumatic Eyes-  Sclera clear, conjunctiva pink Ears- hearing intact Oropharynx- clear Neck- supple, no JVP Lymph- no cervical lymphadenopathy Lungs- Clear to  ausculation bilaterally, normal work of breathing Heart- Rapid regular rate and rhythm, no murmurs, rubs or gallops, PMI not laterally displaced GI- soft, NT, ND, + BS Extremities- no clubbing, cyanosis, or edema MS- no significant deformity or atrophy Skin- no rash or lesion Psych- euthymic mood, full affect Neuro- strength and sensation are intact  EKG- atrial flutter at 118 bpm     Assessment and Plan: 1. Afib  S/p ablation in 2018  Recent successful cardioversion in February  2021  Continue  eliquis 5 mg bid for CHA2DS2VASc score of 4 Recent over pacing attempt  by Dr. Loletha [name] unsuccessful Will be set up for cardioversion   If eraf after this next cardioversion, may need to discuss antiarrythmic's   2. CHA2DS2VASc score of 4 Continue  eiquis 5 mg bid, states no missed doses   Pt plans to go out of town next  week so this will be scheduled for 6/21 F/u here in one week   Butch Penny C. Unice Vantassel, [facility] [address] [place], [place] [address] [phone]

## 2020-02-13 ENCOUNTER — Encounter: Payer: BC Managed Care – PPO | Admitting: Cardiovascular Disease

## 2020-02-21 ENCOUNTER — Other Ambulatory Visit: Payer: Self-pay

## 2020-02-21 ENCOUNTER — Ambulatory Visit (INDEPENDENT_AMBULATORY_CARE_PROVIDER_SITE_OTHER): Payer: BC Managed Care – PPO | Admitting: Emergency Medicine

## 2020-02-21 DIAGNOSIS — I495 Sick sinus syndrome: Secondary | ICD-10-CM

## 2020-02-21 LAB — CUP PACEART INCLINIC DEVICE CHECK
Battery Remaining Longevity: 5 mo
Date Time Interrogation Session: 20210622130557
Implantable Lead Implant Date: 20041025
Implantable Lead Implant Date: 20041025
Implantable Lead Location: 753859
Implantable Lead Location: 753860
Implantable Lead Model: 4457
Implantable Lead Model: 4480
Implantable Lead Serial Number: 338209
Implantable Lead Serial Number: 424134
Implantable Pulse Generator Implant Date: 20110712
Lead Channel Pacing Threshold Amplitude: 1 V
Lead Channel Pacing Threshold Pulse Width: 0.4 ms
Lead Channel Sensing Intrinsic Amplitude: 9.3 mV
Pulse Gen Serial Number: 598266

## 2020-02-21 NOTE — Progress Notes (Signed)
No charge. Monthly battery check in clinic. Patient declines remote monitoring. Device function WNL. < 6 months to ERI. Scheduled for DCCV on 02/24/20. Follow up with Dr Recardo Evangelist on 04/09/20.

## 2020-02-22 ENCOUNTER — Other Ambulatory Visit
Admission: RE | Admit: 2020-02-22 | Discharge: 2020-02-22 | Disposition: A | Discharge status: Home / Self Care | Payer: BC Managed Care – PPO | Source: Ambulatory Visit | Attending: Cardiology | Admitting: Cardiology

## 2020-02-22 DIAGNOSIS — Z20822 Contact with and (suspected) exposure to covid-19: Secondary | ICD-10-CM | POA: Diagnosis not present

## 2020-02-22 DIAGNOSIS — Z01812 Encounter for preprocedural laboratory examination: Secondary | ICD-10-CM | POA: Diagnosis not present

## 2020-02-22 LAB — SARS CORONAVIRUS 2 (TAT 6-24 HRS): SARS Coronavirus 2: NEGATIVE

## 2020-02-24 ENCOUNTER — Ambulatory Visit (HOSPITAL_COMMUNITY): Payer: BC Managed Care – PPO | Admitting: Anesthesiology

## 2020-02-24 ENCOUNTER — Other Ambulatory Visit: Payer: Self-pay

## 2020-02-24 ENCOUNTER — Encounter (HOSPITAL_COMMUNITY): Payer: Self-pay | Admitting: Cardiology

## 2020-02-24 ENCOUNTER — Ambulatory Visit (HOSPITAL_COMMUNITY)
Admission: RE | Admit: 2020-02-24 | Discharge: 2020-02-24 | Disposition: A | Discharge status: Home / Self Care | Payer: BC Managed Care – PPO | Source: Ambulatory Visit | Attending: Cardiology | Admitting: Cardiology

## 2020-02-24 ENCOUNTER — Encounter (HOSPITAL_COMMUNITY)
Admission: RE | Disposition: A | Discharge status: Home / Self Care | Payer: BC Managed Care – PPO | Source: Ambulatory Visit | Attending: Cardiology

## 2020-02-24 DIAGNOSIS — Z87891 Personal history of nicotine dependence: Secondary | ICD-10-CM | POA: Diagnosis not present

## 2020-02-24 DIAGNOSIS — Z79899 Other long term (current) drug therapy: Secondary | ICD-10-CM | POA: Insufficient documentation

## 2020-02-24 DIAGNOSIS — I4819 Other persistent atrial fibrillation: Secondary | ICD-10-CM | POA: Diagnosis present

## 2020-02-24 DIAGNOSIS — M109 Gout, unspecified: Secondary | ICD-10-CM | POA: Diagnosis not present

## 2020-02-24 DIAGNOSIS — Z7901 Long term (current) use of anticoagulants: Secondary | ICD-10-CM | POA: Diagnosis not present

## 2020-02-24 DIAGNOSIS — Z8249 Family history of ischemic heart disease and other diseases of the circulatory system: Secondary | ICD-10-CM | POA: Insufficient documentation

## 2020-02-24 DIAGNOSIS — Z950 Presence of cardiac pacemaker: Secondary | ICD-10-CM | POA: Diagnosis not present

## 2020-02-24 DIAGNOSIS — G4730 Sleep apnea, unspecified: Secondary | ICD-10-CM | POA: Diagnosis not present

## 2020-02-24 HISTORY — PX: CARDIOVERSION: SHX1299

## 2020-02-24 LAB — POCT I-STAT, CHEM 8
BUN: 19 mg/dL (ref 8–23)
Calcium, Ion: 1.23 mmol/L (ref 1.15–1.40)
Chloride: 100 mmol/L (ref 98–111)
Creatinine, Ser: 1.2 mg/dL (ref 0.61–1.24)
Glucose, Bld: 103 mg/dL — ABNORMAL HIGH (ref 70–99)
HCT: 48 % (ref 39.0–52.0)
Hemoglobin: 16.3 g/dL (ref 13.0–17.0)
Potassium: 4.3 mmol/L (ref 3.5–5.1)
Sodium: 141 mmol/L (ref 135–145)
TCO2: 30 mmol/L (ref 22–32)

## 2020-02-24 SURGERY — CARDIOVERSION
Anesthesia: General

## 2020-02-24 MED ORDER — SODIUM CHLORIDE 0.9 % IV SOLN: INTRAVENOUS | Status: DC

## 2020-02-24 MED ORDER — PROPOFOL 10 MG/ML IV BOLUS
INTRAVENOUS | Status: DC | PRN
  Administered 2020-02-24: 40 mg via INTRAVENOUS
  Administered 2020-02-24: 60 mg via INTRAVENOUS

## 2020-02-24 MED ORDER — LIDOCAINE 2% (20 MG/ML) 5 ML SYRINGE
INTRAMUSCULAR | Status: DC | PRN
Start: 2020-02-24 — End: 2020-02-24
  Administered 2020-02-24: 60 mg via INTRAVENOUS

## 2020-02-24 NOTE — Interval H&P Note (Signed)
History and Physical Interval Note:  02/24/2020 9:56 AM  Shane [name]  has presented today for surgery, with the diagnosis of AFIB.  The various methods of treatment have been discussed with the patient and family. After consideration of risks, benefits and other options for treatment, the patient has consented to  Procedure(s): CARDIOVERSION (N/A) as a surgical intervention.  The patient's history has been reviewed, patient examined, no change in status, stable for surgery.  I have reviewed the patient's chart and labs.  Questions were answered to the patient's satisfaction.     Kirk Ruths

## 2020-02-24 NOTE — Procedures (Signed)
Electrical Cardioversion Procedure Note Shane [name] [id number] 1946-07-22  Procedure: Electrical Cardioversion Indications:  Atrial Fibrillation  Procedure Details Consent: Risks of procedure as well as the alternatives and risks of each were explained to the (patient/caregiver).  Consent for procedure obtained. Time Out: Verified patient identification, verified procedure, site/side was marked, verified correct patient position, special equipment/implants available, medications/allergies/relevent history reviewed, required imaging and test results available.  Performed  Patient placed on cardiac monitor, pulse oximetry, supplemental oxygen as necessary.  Sedation given: Pt sedated by anesthesia with lidocaine 60 mg and diprovan 100 mg IV. Pacer pads placed anterior and posterior chest.  Cardioverted 1 time(s).  Cardioverted at [place].  Evaluation Findings: Post procedure EKG shows: AV paced rhythm. Device interrogation confirms AV pacing Complications: None Patient did tolerate procedure well.   Kirk Ruths 02/24/2020, 9:59 AM

## 2020-02-24 NOTE — Anesthesia Preprocedure Evaluation (Signed)
Anesthesia Evaluation    Reviewed: Allergy & Precautions, Patient's Chart, lab work & pertinent test results  Airway Mallampati: II  TM Distance: >3 FB Neck ROM: Full    Dental no notable dental hx.    Pulmonary sleep apnea and Continuous Positive Airway Pressure Ventilation , former smoker,    Pulmonary exam normal breath sounds clear to auscultation       Cardiovascular Normal cardiovascular exam+ dysrhythmias Atrial Fibrillation + pacemaker  Rhythm:Regular Rate:Normal     Neuro/Psych negative neurological ROS     GI/Hepatic negative GI ROS, Neg liver ROS,   Endo/Other  negative endocrine ROS  Renal/GU negative Renal ROS     Musculoskeletal  (+) Arthritis , Gout   Abdominal (+) + obese,   Peds  Hematology negative hematology ROS (+)   Anesthesia Other Findings A-FIB  Reproductive/Obstetrics                             Anesthesia Physical Anesthesia Plan  ASA: III  Anesthesia Plan: General   Post-op Pain Management:    Induction: Intravenous  PONV Risk Score and Plan: 2 and Propofol infusion and Treatment may vary due to age or medical condition  Airway Management Planned: Mask  Additional Equipment:   Intra-op Plan:   Post-operative Plan:   Informed Consent: I have reviewed the patients History and Physical, chart, labs and discussed the procedure including the risks, benefits and alternatives for the proposed anesthesia with the patient or authorized representative who has indicated his/her understanding and acceptance.       Plan Discussed with: CRNA  Anesthesia Plan Comments:         Anesthesia Quick Evaluation

## 2020-02-24 NOTE — H&P (Signed)
ATRIAL FIB OFFICE VISIT     02/08/2020  [facility]            Sherran Needs, NP   Cardiology        Persistent atrial fibrillation ([place]) +1 more   Dx        Referred by Albina Billet, MD   Reason for Visit        Additional Documentation   Vitals:     BP 114/76       Pulse 118 Important         Ht 5\' 11"  (1.803 m)       Wt 121.8 kg       BMI 37.46 kg/m       BSA 2.47 m             More Vitals    Flowsheets:      Anthropometrics,       MEWS Score,       NEWS     Encounter Info:      Billing Info,       History,       Allergies,       Detailed Report            All Notes      Progress Notes by Sanda Klein, MD at 02/08/2020 11:30 AM   Author: Sanda Klein, MD Author Type: Physician Filed: 02/08/2020  4:58 PM  Note Status: Signed Cosign: Cosign Not Required Date of Service: 02/08/2020 11:30 AM  Editor:  Sanda Klein, MD (Physician)                       Thanks, Wendelyn Breslow Notes by Sherran Needs, NP at 02/08/2020 11:30 AM   Author: Sherran Needs, NP Author Type: Nurse Practitioner Filed: 02/08/2020  2:03 PM  Note Status: Addendum Cosign: Cosign Not Required Date of Service: 02/08/2020 11:30 AM  Editor:  Sherran Needs, NP (Nurse Practitioner)       Prior Versions: 1. Sherran Needs, NP (Nurse Practitioner) at 02/08/2020  2:02 PM - Signed         Expand All Collapse All     untitled image     Primary Care Physician: Albina Billet, MD  Referring Physician: Dr. 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 He was seen in the office by Dr. Loletha Grayer and he attempted to pace him out which was unsuccessful. On f/u here if he was  still in out of rhythm, cardioversion was to be scheduled. Ekg shows atrial flutter at 118 bpm. .  He continues on eliquis 5 mg bid for CHA2DS2VASc score of 4. He is mildly symptomatic.     Today, he denies symptoms of palpitations, chest pain,+ shortness of breath, orthopnea, PND, lower extremity edema, dizziness, presyncope, syncope, or neurologic sequela.+ fatigue. The patient is tolerating medications without difficulties and is otherwise without complaint today.           Past Medical History:    Diagnosis   Date    .   Atrioventricular block        .   Degenerative joint disease        .  Gout        .   Paroxysmal atrial fibrillation ([place])        .   Presence of permanent cardiac pacemaker   03/12/10        guidant  ([place])    .   Sinus node dysfunction (H[facility])        .   Sleep apnea            CPAP    .   Wears hearing aid in both ears                 Past Surgical History:    Procedure   Laterality   Date    .   ATRIAL FIBRILLATION ABLATION   N/A   04/03/2017        Procedure: Atrial Fibrillation Ablation;  Surgeon: Constance Haw, MD;  Location: [facility];  Service: Cardiovascular;  Laterality: N/A;    .   CARDIAC CATHETERIZATION       10/13/1994        No CAD    .   CARDIAC CATHETERIZATION       05/10/2003        No CAD    .   CARDIOVERSION   N/A   10/21/2016        Procedure: CARDIOVERSION;  Surgeon: Sanda Klein, MD;  Location: [facility];  Service: Cardiovascular;  Laterality: N/A;    .   CARDIOVERSION   N/A   05/15/2017        Procedure: CARDIOVERSION;  Surgeon: Fay Records, MD;  Location: [facility];  Service: Cardiovascular;  Laterality: N/A;    .   CARDIOVERSION   N/A   10/21/2019        Procedure: CARDIOVERSION;  Surgeon: Sanda Klein, MD;  Location: [facility];  Service: Cardiovascular;   Laterality: N/A;    .   CATARACT EXTRACTION W/PHACO   Left   05/18/2019        Procedure: CATARACT EXTRACTION PHACO AND INTRAOCULAR LENS PLACEMENT ([place]) LEFT  1:21 11.0% 9.01;  Surgeon: Leandrew Koyanagi, MD;  Location: [facility];  Service: Ophthalmology;  Laterality: Left;  sleep apnea    .   CATARACT EXTRACTION W/PHACO   Right   06/29/2019        Procedure: CATARACT EXTRACTION PHACO AND INTRAOCULAR LENS PLACEMENT ([facility]) RIGHT  01:14.2  14.5%  10.92;  Surgeon: Leandrew Koyanagi, MD;  Location: [facility];  Service: Ophthalmology;  Laterality: Right;  sleep apnea    .   CYST EXCISION       03/2019        on back     .   NM MYOCAR PERF WALL MOTION       05/07/2011        Lexiscan: No ishcemia    .   PERMANENT PACEMAKER INSERTION       03/12/2010        guidant    .   ROTATOR CUFF REPAIR       1996    .   SHOULDER ARTHROSCOPY            .   US ECHOCARDIOGRAPHY       05/07/2011        mod. LVH,LA mod. dilated,borderline aortic root dilatation                 Current Outpatient Medications    Medication  Sig   Dispense   Refill    .   allopurinol (ZYLOPRIM) 300 MG tablet   Take 1 tablet (300 mg total) by mouth daily.   30 tablet   2    .   anastrozole (ARIMIDEX) 1 MG tablet   Take 1 mg by mouth daily.             Marland Kitchen   apixaban (ELIQUIS) 5 MG TABS tablet   Take 1 tablet (5 mg total) by mouth 2 (two) times daily.   60 tablet   11    .   Artificial Tear Ointment (DRY EYES OP)   Place 1 drop into both eyes daily as needed (dry eyes).            .   Ascorbic Acid (VITAMIN C) 1000 MG tablet   Take 1,000 mg by mouth daily.            .   Cholecalciferol (VITAMIN D-3) 1000 UNITS CAPS   Take 1,000 Units by mouth daily.             .   citalopram (CELEXA) 40 MG tablet   Take 1 tablet (40 mg total) by mouth daily. MUST  RECEIVE FUTURE REFILLS FROM PCP. (Patient taking differently: Take 40 mg by mouth daily. )   30 tablet   0    .   co-enzyme Q-10 50 MG capsule   Take 50 mg by mouth daily.             .   colchicine 0.6 MG tablet   Take 0.5 tablets (0.3 mg total) by mouth daily as needed. (Patient taking differently: Take 0.3 mg by mouth daily as needed (gout). )   30 tablet   2    .   diltiazem (CARDIZEM CD) 120 MG 24 hr capsule   Take 1 capsule (120 mg total) by mouth daily.   30 capsule   3    .   furosemide (LASIX) 40 MG tablet   Take 1.5 tablets (60 mg total) by mouth daily.   45 tablet   3    .   montelukast (SINGULAIR) 10 MG tablet   Take 10 mg by mouth at bedtime.            .   Omega-3 Fatty Acids (FISH OIL) 1200 MG CAPS   Take 1,200 mg by mouth 2 (two) times daily.             Marland Kitchen   OVER THE COUNTER MEDICATION   Take 100 mg by mouth daily. CBD oil            .   potassium chloride SA (KLOR-CON) 20 MEQ tablet   Take 1 tablet (20 mEq total) by mouth daily.   90 tablet   3    .   psyllium (HYDROCIL/METAMUCIL) 95 % PACK   Take 1 packet by mouth at bedtime.            .   pyridOXINE (VITAMIN B-6) 100 MG tablet   Take 100 mg by mouth daily.            .   tamsulosin (FLOMAX) 0.4 MG CAPS capsule   Take 1 capsule (0.4 mg total) by mouth daily.   30 capsule   2    .   Testosterone 1.62 % GEL   Apply 3 Pump topically daily. Shoulders       1    .  vitamin B-12 (CYANOCOBALAMIN) 1000 MCG tablet   Take 1,000 mcg by mouth daily.            Marland Kitchen   zinc gluconate 50 MG tablet   Take 50 mg by mouth daily.            .   Multiple Vitamins-Minerals (ZINC PO)   Take 50 mg by mouth daily. Silver adult ober 50                No current facility-administered medications for this encounter.          No Known Allergies      Social History              Socioeconomic History    .   Marital status:   Married            Spouse name:   Not on file    .   Number of children:   Not on file    .   Years of education:   Not on file    .   Highest education level:   Not on file    Occupational History    .   Not on file    Tobacco Use    .   Smoking status:   Former Smoker            Types:   Cigars            Quit date:   05/11/1993            Years since quitting:   26.7    .   Smokeless tobacco:   Former Systems developer            Types:   [place] date:   05/11/1993    Substance and Sexual Activity    .   Alcohol use:   Not Currently            Comment: social    .   Drug use:   No    .   Sexual activity:   Not on file    Other Topics   Concern    .   Not on file    Social History Narrative    .   Not on file        Social Determinants of Health           Financial Resource Strain:     .   Difficulty of Paying Living Expenses:     Food Insecurity:     .   Worried About Charity fundraiser in the Last Year:     .   Arboriculturist in the Last Year:     Transportation Needs:     .   Film/video editor (Medical):     Marland Kitchen   Lack of Transportation (Non-Medical):     Physical Activity:     .   Days of Exercise per Week:     .   Minutes of Exercise per Session:     Stress:     .   Feeling of Stress :     Social Connections:     .   Frequency of Communication with Friends and Family:     .   Frequency of Social Gatherings with Friends and Family:     .   Attends Religious  Services:     .   Active Member of Clubs or Organizations:     .   Attends Archivist Meetings:     Marland Kitchen   Marital Status:     Intimate Partner Violence:     .   Fear of Current or Ex-Partner:     .   Emotionally Abused:     Marland Kitchen    Physically Abused:     .   Sexually Abused:                 Family History    Problem   Relation   Age of Onset    .   Stroke   Mother        .   Heart failure   Mother        .   Alzheimer's disease   Father        .   Heart disease   Sister        .   Dementia   Sister              ROS- All systems are reviewed and negative except as per the HPI above     Physical Exam:      Vitals:        02/08/20 1132    BP:   114/76    Pulse:   (!) 118    Weight:   121.8 kg    Height:   5\' 11"  (1.803 m)           Wt Readings from Last 3 Encounters:    02/08/20   121.8 kg    02/06/20   121.2 kg    12/05/19   121.6 kg          Labs:  Recent Labs                                                                                                                                                  Recent Labs                                           Recent Labs             GEN- The patient is well appearing, alert and oriented x 3 today.    Head- normocephalic, atraumatic  Eyes-  Sclera clear, conjunctiva pink  Ears- hearing intact  Oropharynx- clear  Neck- supple, no JVP  Lymph- no cervical lymphadenopathy  Lungs- Clear to ausculation bilaterally, normal work of breathing  Heart- Rapid regular rate and rhythm, no murmurs, rubs or gallops, PMI not laterally displaced  GI- soft, NT, ND, + BS  Extremities- no clubbing, cyanosis, or edema  MS- no significant deformity or atrophy  Skin- no rash or lesion  Psych- euthymic mood, full affect  Neuro- strength and sensation are intact     EKG- atrial flutter at 118 bpm            Assessment and Plan:  1. Afib   S/p ablation in 2018   Recent successful cardioversion in February   2021   Continue  eliquis 5 mg bid for CHA2DS2VASc score of 4  Recent over pacing attempt  by Dr. Loletha Grayer unsuccessful  Will be set up for cardioversion    If eraf after this next cardioversion, may need to discuss antiarrythmic's      2. CHA2DS2VASc score of 4  Continue  eiquis 5 mg bid, states no missed doses      Pt plans to go out of town next  week so this will be scheduled for 6/21  F/u here in one week      Butch Penny C. [place], [facility]  [address]  [place], [place] [address]  [phone]                  Patient Instructions by Juluis Mire, RN at 02/08/2020 11:30 AM   Author: Juluis Mire, RN Author Type: Registered Nurse Filed: 02/08/2020 12:22 PM  Note Status: Signed Cosign: Cosign Not Required Date of Service: 02/08/2020 11:30 AM  Editor:  Juluis Mire, RN (Registered Nurse)                     Cardioversion scheduled for Friday, June 25th              - Arrive at the [facility] and go to admitting at [facility] not eat or drink anything after midnight the night prior to your procedure.              - Take all your morning medication (except diabetic medications) with a sip of water prior to arrival.              - You will not be able to drive home after your procedure.              - Do NOT miss any doses of your blood thinner - if you should miss a dose please notify our office immediately.                  Instructions           Cardioversion scheduled for Friday, June 25th              - Arrive at the [facility] and go to admitting at [facility] not eat or drink anything after midnight the night prior to your procedure.              - Take all your morning medication (except diabetic medications) with a sip of water prior to arrival.              - You will not be able to drive home after your procedure.               - Do NOT miss any doses of your blood thinner - if you should miss a dose  please notify our office immediately.                        After Visit Summary (Printed 02/08/2020)             Media  From this encounter    Electronic signature on 02/08/2020 11:31 AM - E-signed        Communication Routing History   None        Conversation [employer])   February 06, 2020             9:56 AM Roland Earl contacted [name] (In Person)     February 08, 2020    Juluis Mire, RN          12:22 PM  Note         Cardioversion scheduled for Friday, June 25th              - Arrive at the [facility] and go to admitting at [facility] not eat or drink anything after midnight the night prior to your procedure.              - Take all your morning medication (except diabetic medications) with a sip of water prior to arrival.              - You will not be able to drive home after your procedure.              - Do NOT miss any doses of your blood thinner - if you should miss a dose please notify our office immediately.              Sherran Needs, NP          2:02 PM  Note       Expand All Collapse All     untitled image     Primary Care Physician: Albina Billet, MD  Referring Physician: Dr. 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 He was seen in the office by Dr. Loletha Grayer and he attempted to pace him out which was unsuccessful. On f/u here if he was still in out of rhythm, cardioversion was to be scheduled. Ekg shows atrial flutter at 118 bpm. .  He continues on eliquis 5 mg bid for CHA2DS2VASc score of 4. He is mildly symptomatic.     Today, he denies symptoms of palpitations, chest pain,+ shortness of  breath, orthopnea, PND, lower extremity edema, dizziness, presyncope, syncope, or neurologic sequela.+ fatigue. The patient is tolerating medications without difficulties and is otherwise without complaint today.           Past Medical History:    Diagnosis   Date    .   Atrioventricular block        .   Degenerative joint disease        .   Gout        .   Paroxysmal atrial fibrillation (H[facility])        .   Presence of permanent cardiac pacemaker   03/12/10        guidant  ([place])    .  Sinus node dysfunction (HCC)        .   Sleep apnea            CPAP    .   Wears hearing aid in both ears                 Past Surgical History:    Procedure   Laterality   Date    .   ATRIAL FIBRILLATION ABLATION   N/A   04/03/2017        Procedure: Atrial Fibrillation Ablation;  Surgeon: Constance Haw, MD;  Location: [facility];  Service: Cardiovascular;  Laterality: N/A;    .   CARDIAC CATHETERIZATION       10/13/1994        No CAD    .   CARDIAC CATHETERIZATION       05/10/2003        No CAD    .   CARDIOVERSION   N/A   10/21/2016        Procedure: CARDIOVERSION;  Surgeon: Sanda Klein, MD;  Location: [facility];  Service: Cardiovascular;  Laterality: N/A;    .   CARDIOVERSION   N/A   05/15/2017        Procedure: CARDIOVERSION;  Surgeon: Fay Records, MD;  Location: [facility];  Service: Cardiovascular;  Laterality: N/A;    .   CARDIOVERSION   N/A   10/21/2019        Procedure: CARDIOVERSION;  Surgeon: Sanda Klein, MD;  Location: [facility];  Service: Cardiovascular;  Laterality: N/A;    .   CATARACT EXTRACTION W/PHACO   Left   05/18/2019        Procedure: CATARACT EXTRACTION PHACO AND INTRAOCULAR LENS PLACEMENT ([place]) LEFT  1:21 11.0% 9.01;  Surgeon: Leandrew Koyanagi, MD;  Location: [facility];   Service: Ophthalmology;  Laterality: Left;  sleep apnea    .   CATARACT EXTRACTION W/PHACO   Right   06/29/2019        Procedure: CATARACT EXTRACTION PHACO AND INTRAOCULAR LENS PLACEMENT ([facility]) RIGHT  01:14.2  14.5%  10.92;  Surgeon: Leandrew Koyanagi, MD;  Location: [facility];  Service: Ophthalmology;  Laterality: Right;  sleep apnea    .   CYST EXCISION       03/2019        on back     .   NM MYOCAR PERF WALL MOTION       05/07/2011        Lexiscan: No ishcemia    .   PERMANENT PACEMAKER INSERTION       03/12/2010        guidant    .   ROTATOR CUFF REPAIR       1996    .   SHOULDER ARTHROSCOPY            .   US ECHOCARDIOGRAPHY       05/07/2011        mod. LVH,LA mod. dilated,borderline aortic root dilatation                 Current Outpatient Medications    Medication   Sig   Dispense   Refill    .   allopurinol (ZYLOPRIM) 300 MG tablet   Take 1 tablet (300 mg total) by mouth daily.   30 tablet   2    .   anastrozole (ARIMIDEX) 1 MG tablet   Take 1  mg by mouth daily.             Marland Kitchen   apixaban (ELIQUIS) 5 MG TABS tablet   Take 1 tablet (5 mg total) by mouth 2 (two) times daily.   60 tablet   11    .   Artificial Tear Ointment (DRY EYES OP)   Place 1 drop into both eyes daily as needed (dry eyes).            .   Ascorbic Acid (VITAMIN C) 1000 MG tablet   Take 1,000 mg by mouth daily.            .   Cholecalciferol (VITAMIN D-3) 1000 UNITS CAPS   Take 1,000 Units by mouth daily.             .   citalopram (CELEXA) 40 MG tablet   Take 1 tablet (40 mg total) by mouth daily. MUST RECEIVE FUTURE REFILLS FROM PCP. (Patient taking differently: Take 40 mg by mouth daily. )   30 tablet   0    .   co-enzyme Q-10 50 MG capsule   Take 50 mg by mouth daily.             .   colchicine 0.6 MG tablet   Take 0.5 tablets (0.3 mg  total) by mouth daily as needed. (Patient taking differently: Take 0.3 mg by mouth daily as needed (gout). )   30 tablet   2    .   diltiazem (CARDIZEM CD) 120 MG 24 hr capsule   Take 1 capsule (120 mg total) by mouth daily.   30 capsule   3    .   furosemide (LASIX) 40 MG tablet   Take 1.5 tablets (60 mg total) by mouth daily.   45 tablet   3    .   montelukast (SINGULAIR) 10 MG tablet   Take 10 mg by mouth at bedtime.            .   Omega-3 Fatty Acids (FISH OIL) 1200 MG CAPS   Take 1,200 mg by mouth 2 (two) times daily.             Marland Kitchen   OVER THE COUNTER MEDICATION   Take 100 mg by mouth daily. CBD oil            .   potassium chloride SA (KLOR-CON) 20 MEQ tablet   Take 1 tablet (20 mEq total) by mouth daily.   90 tablet   3    .   psyllium (HYDROCIL/METAMUCIL) 95 % PACK   Take 1 packet by mouth at bedtime.            .   pyridOXINE (VITAMIN B-6) 100 MG tablet   Take 100 mg by mouth daily.            .   tamsulosin (FLOMAX) 0.4 MG CAPS capsule   Take 1 capsule (0.4 mg total) by mouth daily.   30 capsule   2    .   Testosterone 1.62 % GEL   Apply 3 Pump topically daily. Shoulders       1    .   vitamin B-12 (CYANOCOBALAMIN) 1000 MCG tablet   Take 1,000 mcg by mouth daily.            Marland Kitchen   zinc gluconate 50 MG tablet   Take 50 mg by mouth daily.            Marland Kitchen  Multiple Vitamins-Minerals (ZINC PO)   Take 50 mg by mouth daily. Silver adult ober 50                No current facility-administered medications for this encounter.          No Known Allergies      Social History             Socioeconomic History    .   Marital status:   Married            Spouse name:   Not on file    .   Number of children:   Not on file    .   Years of education:   Not on file    .   Highest education level:   Not on file     Occupational History    .   Not on file    Tobacco Use    .   Smoking status:   Former Smoker            Types:   Cigars            Quit date:   05/11/1993            Years since quitting:   26.7    .   Smokeless tobacco:   Former Systems developer            Types:   [place] date:   05/11/1993    Substance and Sexual Activity    .   Alcohol use:   Not Currently            Comment: social    .   Drug use:   No    .   Sexual activity:   Not on file    Other Topics   Concern    .   Not on file    Social History Narrative    .   Not on file        Social Determinants of Health           Financial Resource Strain:     .   Difficulty of Paying Living Expenses:     Food Insecurity:     .   Worried About Charity fundraiser in the Last Year:     .   Arboriculturist in the Last Year:     Transportation Needs:     .   Film/video editor (Medical):     Marland Kitchen   Lack of Transportation (Non-Medical):     Physical Activity:     .   Days of Exercise per Week:     .   Minutes of Exercise per Session:     Stress:     .   Feeling of Stress :     Social Connections:     .   Frequency of Communication with Friends and Family:     .   Frequency of Social Gatherings with Friends and Family:     .   Attends Religious Services:     .   Active Member of Clubs or Organizations:     .   Attends Archivist Meetings:     Marland Kitchen   Marital Status:     Intimate Partner Violence:     .   Fear of Current or Ex-Partner:     .  Emotionally Abused:     Marland Kitchen   Physically Abused:     .   Sexually Abused:                 Family History    Problem   Relation   Age of Onset    .   Stroke   Mother        .   Heart failure   Mother        .   Alzheimer's disease   Father         .   Heart disease   Sister        .   Dementia   Sister              ROS- All systems are reviewed and negative except as per the HPI above     Physical Exam:      Vitals:        02/08/20 1132    BP:   114/76    Pulse:   (!) 118    Weight:   121.8 kg    Height:   5\' 11"  (1.803 m)           Wt Readings from Last 3 Encounters:    02/08/20   121.8 kg    02/06/20   121.2 kg    12/05/19   121.6 kg          Labs:  Recent Labs                                                                                                                                                  Recent Labs                                           Recent Labs             GEN- The patient is well appearing, alert and oriented x 3 today.    Head- normocephalic, atraumatic  Eyes-  Sclera clear, conjunctiva pink  Ears- hearing intact  Oropharynx- clear  Neck- supple, no JVP  Lymph- no cervical lymphadenopathy  Lungs- Clear to ausculation bilaterally, normal work of breathing  Heart- Rapid regular rate and rhythm, no murmurs, rubs or gallops, PMI not laterally displaced  GI- soft, NT, ND, + BS  Extremities- no clubbing, cyanosis, or edema  MS- no significant deformity or atrophy  Skin- no rash or lesion  Psych- euthymic mood, full affect  Neuro- strength and sensation are intact     EKG- atrial flutter at 118 bpm            Assessment and Plan:  1. Afib   S/p ablation in 2018   Recent  successful cardioversion in February  2021   Continue  eliquis 5 mg bid for CHA2DS2VASc score of 4  Recent over pacing attempt  by Dr. Loletha Grayer unsuccessful  Will be set up for cardioversion    If eraf after this next cardioversion, may need to discuss antiarrythmic's      2. CHA2DS2VASc score of  4  Continue  eiquis 5 mg bid, states no missed doses      Pt plans to go out of town next  week so this will be scheduled for 6/21  F/u here in one week      Butch Penny C. Mila [facility]  [facility]  [address]  [place], [place] [address]  [phone]       For [place]; compliant with apixaban; no changes [place]

## 2020-02-24 NOTE — Anesthesia Procedure Notes (Signed)
Procedure Name: General with mask airway Date/Time: 02/24/2020 11:04 AM Performed by: Amadeo Garnet, CRNA Pre-anesthesia Checklist: Emergency Drugs available, Suction available, Patient identified and Patient being monitored Patient Re-evaluated:Patient Re-evaluated prior to induction Oxygen Delivery Method: Ambu bag Preoxygenation: Pre-oxygenation with 100% oxygen Induction Type: IV induction Ventilation: Mask ventilation without difficulty Placement Confirmation: positive ETCO2 Dental Injury: Teeth and Oropharynx as per pre-operative assessment

## 2020-02-24 NOTE — Discharge Instructions (Signed)
Electrical Cardioversion   What can I expect after the procedure?  Your blood pressure, heart rate, breathing rate, and blood oxygen level will be monitored until you leave the hospital or clinic.  Your heart rhythm will be watched to make sure it does not change.  You may have some redness on the skin where the shocks were given. Follow these instructions at home:  Do not drive for 24 hours if you were given a sedative during your procedure.  Take over-the-counter and prescription medicines only as told by your health care provider.  Ask your health care provider how to check your pulse. Check it often.  Rest for 48 hours after the procedure or as told by your health care provider.  Avoid or limit your caffeine use as told by your health care provider.  Keep all follow-up visits as told by your health care provider. This is important. Contact a health care provider if:  You feel like your heart is beating too quickly or your pulse is not regular.  You have a serious muscle cramp that does not go away. Get help right away if:  You have discomfort in your chest.  You are dizzy or you feel faint.  You have trouble breathing or you are short of breath.  Your speech is slurred.  You have trouble moving an arm or leg on one side of your body.  Your fingers or toes turn cold or blue. Summary  Electrical cardioversion is the delivery of a jolt of electricity to restore a normal rhythm to the heart.  This procedure may be done right away in an emergency or may be a scheduled procedure if the condition is not an emergency.  Generally, this is a safe procedure.  After the procedure, check your pulse often as told by your health care provider. This information is not intended to replace advice given to you by your health care provider. Make sure you discuss any questions you have with your health care provider. Document Revised: 03/21/2019 Document Reviewed:  03/21/2019 Elsevier Patient Education  2020 Else[employer] Inc.  

## 2020-02-24 NOTE — Transfer of Care (Signed)
Immediate Anesthesia Transfer of Care Note  Patient: KIA STAVROS  Procedure(s) Performed: CARDIOVERSION (N/A )  Patient Location: Endoscopy Unit  Anesthesia Type:General  Level of Consciousness: awake, alert  and oriented  Airway & Oxygen Therapy: Patient Spontanous Breathing and Patient connected to nasal cannula oxygen  Post-op Assessment: Report given to RN, Post -op Vital signs reviewed and stable and Patient moving all extremities  Post vital signs: Reviewed and stable  Last Vitals:  Vitals Value Taken Time  BP    Temp    Pulse    Resp    SpO2      Last Pain: There were no vitals filed for this visit.       Complications: No complications documented.

## 2020-02-24 NOTE — Anesthesia Postprocedure Evaluation (Signed)
Anesthesia Post Note  Patient: [place] [name]  Procedure(s) Performed: CARDIOVERSION (N/A )     Patient location during evaluation: Endoscopy Anesthesia Type: General Level of consciousness: awake Pain management: pain level controlled Vital Signs Assessment: post-procedure vital signs reviewed and stable Respiratory status: spontaneous breathing, nonlabored ventilation, respiratory function stable and patient connected to nasal cannula oxygen Cardiovascular status: blood pressure returned to baseline and stable Postop Assessment: no apparent nausea or vomiting Anesthetic complications: no   No complications documented.  Last Vitals:  Vitals:   02/24/20 1120 02/24/20 1130  BP: 102/67 107/69  Pulse: 84 (!) 59  Resp: 14 14  SpO2: 99% 99%    Last Pain:  Vitals:   02/24/20 1130  PainSc: 0-No pain                 Kandra Graven P Kayleana Waites

## 2020-02-26 ENCOUNTER — Encounter (HOSPITAL_COMMUNITY): Payer: Self-pay | Admitting: Cardiology

## 2020-02-29 ENCOUNTER — Other Ambulatory Visit: Payer: Self-pay | Admitting: Cardiovascular Disease

## 2020-03-06 ENCOUNTER — Encounter (HOSPITAL_COMMUNITY): Payer: Self-pay | Admitting: Nurse Practitioner

## 2020-03-06 ENCOUNTER — Other Ambulatory Visit: Payer: Self-pay

## 2020-03-06 ENCOUNTER — Ambulatory Visit (HOSPITAL_COMMUNITY)
Admission: RE | Admit: 2020-03-06 | Discharge: 2020-03-06 | Disposition: A | Discharge status: Home / Self Care | Payer: BC Managed Care – PPO | Source: Ambulatory Visit | Attending: Nurse Practitioner | Admitting: Nurse Practitioner

## 2020-03-06 VITALS — BP 86/78 | HR 140 | Ht 71.0 in | Wt 265.8 lb

## 2020-03-06 DIAGNOSIS — Z79899 Other long term (current) drug therapy: Secondary | ICD-10-CM | POA: Insufficient documentation

## 2020-03-06 DIAGNOSIS — Z950 Presence of cardiac pacemaker: Secondary | ICD-10-CM | POA: Diagnosis not present

## 2020-03-06 DIAGNOSIS — M1990 Unspecified osteoarthritis, unspecified site: Secondary | ICD-10-CM | POA: Insufficient documentation

## 2020-03-06 DIAGNOSIS — Z9989 Dependence on other enabling machines and devices: Secondary | ICD-10-CM | POA: Diagnosis not present

## 2020-03-06 DIAGNOSIS — D6869 Other thrombophilia: Secondary | ICD-10-CM

## 2020-03-06 DIAGNOSIS — I480 Paroxysmal atrial fibrillation: Secondary | ICD-10-CM | POA: Insufficient documentation

## 2020-03-06 DIAGNOSIS — I4430 Unspecified atrioventricular block: Secondary | ICD-10-CM | POA: Diagnosis not present

## 2020-03-06 DIAGNOSIS — Z974 Presence of external hearing-aid: Secondary | ICD-10-CM | POA: Diagnosis not present

## 2020-03-06 DIAGNOSIS — I484 Atypical atrial flutter: Secondary | ICD-10-CM

## 2020-03-06 DIAGNOSIS — Z7901 Long term (current) use of anticoagulants: Secondary | ICD-10-CM | POA: Diagnosis not present

## 2020-03-06 DIAGNOSIS — Z8249 Family history of ischemic heart disease and other diseases of the circulatory system: Secondary | ICD-10-CM | POA: Diagnosis not present

## 2020-03-06 DIAGNOSIS — M109 Gout, unspecified: Secondary | ICD-10-CM | POA: Diagnosis not present

## 2020-03-06 DIAGNOSIS — Z87891 Personal history of nicotine dependence: Secondary | ICD-10-CM | POA: Diagnosis not present

## 2020-03-06 DIAGNOSIS — G4730 Sleep apnea, unspecified: Secondary | ICD-10-CM | POA: Diagnosis not present

## 2020-03-06 DIAGNOSIS — I4892 Unspecified atrial flutter: Secondary | ICD-10-CM | POA: Diagnosis not present

## 2020-03-06 MED ORDER — AMIODARONE HCL 200 MG PO TABS
ORAL_TABLET | ORAL | 0 refills | Status: DC
Start: 2020-03-06 — End: 2020-03-28

## 2020-03-06 NOTE — Progress Notes (Addendum)
Found to be in Atrial Flutter with native 2:1 AV conduction and ventricular rate at 140 bpm, symptomatic and mildly hypotensive. Had a recent DC cardioversion. Afib ablation in 2018.   Pacemaker interrogation confirms atrial flutter with cycle length 260 ms. Pacemaker function is otherwise normal, although approaching ERI (<6 months).  Overdriveburst  atrial  pacing using his device was attempted.  Engagement of Shane atrial circuit appeared to be consistently present at CL 220 ms or shorter, but Shane AFlutter consistently reorganized. Shorter and shorter cycle lengths were tried. After pacing at 160 ms, Shane rhythm promptly converted to .sfi. rate control was much better at 98 bpm. BP improved.  Will restart amiodarone and tentatively schedule for another DC CV. May need a "touch-up" ablation.Found to be in Atrial Flutter with native 2:1 AV conduction and ventricular rate at 140 bpm, symptomatic and mildly hypotensive. Had a recent DC cardioversion. [place] [name] is roughly 5 months out from RF ablation. Pacemaker interrogation confirms atrial flutter with cycle length 260 ms. Pacemaker function is otherwise normal, although approaching ERI (<6 months).  Note above by [place] [name], interrogated  pt in [place] afib clinic, 03/06/20.    Primary Care Physician: Albina Billet, MD Referring Physician: Dr. 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 male with a h/o PPM,  paroxysmal afib, s/p ablation in 2018 and being seen in f/u in afib clinic for return to atrial flutter. [place] [name] was seen in Shane office by [place] [name] and [place] [name] attempted to pace him out which was unsuccessful. On f/u here if Shane [name] was still in out of rhythm, cardioversion was to be scheduled. Ekg shows atrial flutter at 118 bpm. .  [place] [name] continues on eliquis 5 mg bid for CHA2DS2VASc score of 4. [place] [name] is mildly symptomatic.  F/u in afib clinic, 7/6, one week s/p successful  cardioversion.  Unfortunately, Shane [name] is in atrial flutter at a rate around 150 bpm and  mildly  hypotensive. Pt feels slightly lightheaded and mild fatgiue. Shane [name] has felt ok for Shane last several days and has not checked his HR. Shane [name] in and attempted overdrive pacing, see his note above. Unfortunately, Shane [name] did not return  to SR but to a slower afib at a rate of 90 bpm.  Bp came up into Shane 74'Y systolic with pt feeling better. Denies being lightheaded now. Shane [name] had a prior afib  ablation 04/2017. After discussion with Shane [name]. Shane [name] will be started on amiodarone since BP is a concern.   Today, [place] [name] denies symptoms of palpitations, chest pain,+ lightheadedness, no   shortness of breath, orthopnea, PND, lower extremity edema, dizziness, presyncope, syncope, or neurologic sequela.+ fatigue. [place] [name] is tolerating medications without difficulties and is otherwise without complaint today.   Past Medical History:  Diagnosis Date  . Atrioventricular block   . Degenerative joint disease   . Gout   . Paroxysmal atrial fibrillation (HCC)   . 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 Sinus node dysfunction (HCC)   . Sleep apnea    CPAP  . 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 ATRIAL FIBRILLATION ABLATION N/A 04/03/2017   Procedure: Atrial Fibrillation Ablation;  Surgeon: Constance Haw, MD;  Location: [place] [facility];  Service: Cardiovascular;  Laterality: N/A;  . CARDIAC CATHETERIZATION  10/13/1994   No CAD  . CARDIAC CATHETERIZATION  05/10/2003   No  CAD  . CARDIOVERSION N/A 10/21/2016   Procedure: CARDIOVERSION;  Surgeon: Sanda Klein, MD;  Location: [place];  Service: Cardiovascular;  Laterality: N/A;  . CARDIOVERSION N/A 05/15/2017   Procedure: CARDIOVERSION;  Surgeon: Fay Records, MD;  Location: [place];  Service: Cardiovascular;  Laterality: N/A;  . CARDIOVERSION N/A 10/21/2019   Procedure: CARDIOVERSION;  Surgeon: Sanda Klein, MD;  Location: [facility];  Service:  Cardiovascular;  Laterality: N/A;  . CARDIOVERSION N/A 02/24/2020   Procedure: CARDIOVERSION;  Surgeon: Lelon Perla, MD;  Location: [facility];  Service: Cardiovascular;  Laterality: N/A;  . CATARACT EXTRACTION W/PHACO Left 05/18/2019   Procedure: CATARACT EXTRACTION PHACO AND INTRAOCULAR LENS PLACEMENT ([place]) LEFT  1:21 11.0% 9.01;  Surgeon: Leandrew Koyanagi, MD;  Location: [facility];  Service: Ophthalmology;  Laterality: Left;  sleep apnea  . CATARACT EXTRACTION W/PHACO Right 06/29/2019   Procedure: CATARACT EXTRACTION PHACO AND INTRAOCULAR LENS PLACEMENT ([facility]) RIGHT  01:14.2  14.5%  10.92;  Surgeon: Leandrew Koyanagi, MD;  Location: [place];  Service: Ophthalmology;  Laterality: Right;  sleep apnea  . CYST EXCISION  03/2019   on back   . NM MYOCAR PERF WALL MOTION  05/07/2011   Lexiscan: No ishcemia  . PERMANENT PACEMAKER INSERTION  03/12/2010   guidant  . ROTATOR CUFF REPAIR  1996  . SHOULDER ARTHROSCOPY    . US ECHOCARDIOGRAPHY  05/07/2011   mod. LVH,LA mod. dilated,borderline aortic root dilatation    Current Outpatient Medications  Medication Sig Dispense Refill  . allopurinol (ZYLOPRIM) 300 MG tablet Take 1 tablet (300 mg total) by mouth daily. 30 tablet 2  . anastrozole (ARIMIDEX) 1 MG tablet Take 1 mg by mouth daily.     Marland Kitchen apixaban (ELIQUIS) 5 MG TABS tablet Take 1 tablet (5 mg total) by mouth 2 (two) times daily. 60 tablet 11  . Artificial Tear Ointment (DRY EYES OP) Place 1 drop into both eyes as needed (dry eyes).     . Ascorbic Acid (VITAMIN C) 1000 MG tablet Take 1,000 mg by mouth daily.    . Cholecalciferol (VITAMIN D-3) 1000 UNITS CAPS Take 1,000 Units by mouth daily.     . citalopram (CELEXA) 40 MG tablet Take 1 tablet (40 mg total) by mouth daily. MUST RECEIVE FUTURE REFILLS FROM PCP. ([name] taking differently: Take 40 mg by mouth daily. ) 30 tablet 0  . co-enzyme Q-10 50 MG capsule Take 50 mg by mouth daily.     . colchicine 0.6 MG  tablet Take 0.5 tablets (0.3 mg total) by mouth daily as needed. ([name] taking differently: Take 0.3 mg by mouth as needed (gout). ) 30 tablet 2  . diltiazem (CARDIZEM CD) 120 MG 24 hr capsule Take 1 capsule (120 mg total) by mouth daily. 30 capsule 3  . furosemide (LASIX) 40 MG tablet Take 1.5 tablets (60 mg total) by mouth daily. 45 tablet 3  . montelukast (SINGULAIR) 10 MG tablet Take 10 mg by mouth at bedtime.    . Omega-3 Fatty Acids (FISH OIL) 1200 MG CAPS Take 1,200 mg by mouth 2 (two) times daily.     Marland Kitchen OVER [place] COUNTER MEDICATION Take 100 mg by mouth daily. CBD oil    . potassium chloride SA (KLOR-CON) 20 MEQ tablet Take 1 tablet (20 mEq total) by mouth daily. 90 tablet 3  . psyllium (HYDROCIL/METAMUCIL) 95 % PACK Take 1 packet by mouth at bedtime.    . pyridOXINE (VITAMIN B-6) 100 MG tablet Take 100 mg  by mouth daily.    . tamsulosin (FLOMAX) 0.4 MG CAPS capsule Take 1 capsule (0.4 mg total) by mouth daily. 30 capsule 2  . Testosterone 1.62 % GEL Apply 3 Pump topically daily. Shoulders  1  . vitamin B-12 (CYANOCOBALAMIN) 1000 MCG tablet Take 1,000 mcg by mouth daily.    Marland Kitchen zinc gluconate 50 MG tablet Take 50 mg by mouth daily.    Marland Kitchen amiodarone (PACERONE) 200 MG tablet Take 1 tablet by mouth twice a day for 1 month then reduce to 1 tablet a day 60 tablet 0   No current facility-administered medications for this encounter.    No Known Allergies  Social History   Socioeconomic History  . Marital status: Married    Spouse name: Not on file  . Number of children: Not on file  . Years of education: Not on file  . Highest education level: Not on file  Occupational History  . Not on file  Tobacco Use  . Smoking status: Former Smoker    Types: Cigars    Quit date: 05/11/1993    Years since quitting: 26.8  . Smokeless tobacco: Former Systems developer    Types: Chew    Quit date: 05/11/1993  Vaping Use  . Vaping Use: Never used  Substance and Sexual Activity  . Alcohol use: Not Currently     Comment: social  . Drug use: No  . Sexual activity: Not on file  Other Topics Concern  . Not on file  Social History Narrative  . Not on file   Social Determinants of Health   Financial Resource Strain:   . Difficulty of Paying Living Expenses:   Food Insecurity:   . Worried About Charity fundraiser in [place] Last Year:   . Arboriculturist in [place] Last Year:   Transportation Needs:   . Film/video editor (Medical):   Marland Kitchen Lack of Transportation (Non-Medical):   Physical Activity:   . Days of Exercise per Week:   . Minutes of Exercise per Session:   Stress:   . Feeling of Stress :   Social Connections:   . Frequency of Communication with Friends and Family:   . Frequency of Social Gatherings with Friends and Family:   . Attends Religious Services:   . Active Member of Clubs or Organizations:   . Attends Archivist Meetings:   Marland Kitchen Marital Status:   Intimate Partner Violence:   . Fear of Current or Ex-Partner:   . Emotionally Abused:   Marland Kitchen Physically Abused:   . Sexually Abused:     Family History  Problem Relation Age of Onset  . Stroke Mother   . Heart failure Mother   . Alzheimer's disease Father   . Heart disease Sister   . Dementia Sister     ROS- All systems are reviewed and negative except as per Shane HPI above  Physical Exam: Vitals:   03/06/20 1102  BP: (!) 86/78  Pulse: (!) 140  Weight: 120.6 kg  Height: 5\' 11"  (1.803 m)   Wt Readings from Last 3 Encounters:  03/06/20 120.6 kg  02/08/20 121.8 kg  02/06/20 121.2 kg    Labs: Lab Results  Component Value Date   NA 141 02/24/2020   K 4.3 02/24/2020   CL 100 02/24/2020   CO2 31 10/04/2019   GLUCOSE 103 (H) 02/24/2020   BUN 19 02/24/2020   CREATININE 1.20 02/24/2020   CALCIUM 9.5 10/04/2019   MG 2.0 04/22/2017  Lab Results  Component Value Date   INR 1.1 10/14/2016   No results found for: CHOL, HDL, LDLCALC, TRIG   GEN- Shane [name] is well appearing, alert and oriented x 3 today.    Head- normocephalic, atraumatic Eyes-  Sclera clear, conjunctiva pink Ears- hearing intact Oropharynx- clear Neck- supple, no JVP Lymph- no cervical lymphadenopathy Lungs- Clear to ausculation bilaterally, normal work of breathing Heart- Rapid regular rate and rhythm, no murmurs, rubs or gallops, PMI not laterally displaced GI- soft, NT, ND, + BS Extremities- no clubbing, cyanosis, or edema MS- no significant deformity or atrophy Skin- no rash or lesion Psych- euthymic mood, full affect Neuro- strength and sensation are intact  EKG- atrial flutter at 140 bpm   See note from  [place] [name] about overdrive pacing attempt resulting  in a slower afib at 90 bpm.     Assessment and Plan: 1. Afib  S/p ablation in 2018  Recent successful cardioversion in February  2021 and again 6/25 but ERAF Continue  eliquis 5 mg bid for CHA2DS2VASc score of 4 Over drive  pacing attempt  by [place] [name] turned rapid a flutter to a more  manageable afib at 90 bpm After discussion with Shane [name], will start amiodarone at 200 mg bid and get another appointment with Shane [name]  in several weeks to see if a re toudh ablation candidate.  Continue diltiazem 120 mg qd  Echo 12/19/19 showed  left atrium severely dilated at 5.20 cm   2. CHA2DS2VASc score of 4 Continue  eiquis 5 mg bid   Pt plans to go out of town Shane end of Shane week so will see back for ekg/bp check Thursday pm  Butch Penny C. Cherryl Babin, [facility] [address] [place], [place] [address] [phone]

## 2020-03-06 NOTE — Progress Notes (Signed)
Found to be in Atrial Flutter with native 2:1 AV conduction and ventricular rate at 140 bpm, symptomatic and mildly hypotensive. Had a recent DC cardioversion. He is roughly 5 months out from RF ablation. Pacemaker interrogation confirms atrial flutter with cycle length 260 ms. Pacemaker function is otherwise normal, although approaching ERI (<6 months).  Overdriveburst  atrial  pacing using his device was attempted.  Engagement of the atrial circuit appeared to be consistently present at CL 220 ms or shorter, but the AFlutter consistently reorganized. Shorter and shorter cycle lengths were tried. After pacing at 160 ms, the rhythm promptly converted to .sfi. rate control was much better at 98 bpm. BP improved.  Will restart amiodarone and tentatively schedule for another DC CV. May need a "touch-up" ablation.

## 2020-03-06 NOTE — Patient Instructions (Signed)
Start amiodarone 200mg  twice a day for 1 month then reduce to 200mg  once a day

## 2020-03-08 ENCOUNTER — Ambulatory Visit (HOSPITAL_COMMUNITY)
Admission: RE | Admit: 2020-03-08 | Discharge: 2020-03-08 | Disposition: A | Discharge status: Home / Self Care | Payer: BC Managed Care – PPO | Source: Ambulatory Visit | Attending: Nurse Practitioner | Admitting: Nurse Practitioner

## 2020-03-08 ENCOUNTER — Other Ambulatory Visit: Payer: Self-pay

## 2020-03-08 VITALS — BP 106/56 | HR 102

## 2020-03-08 DIAGNOSIS — I4891 Unspecified atrial fibrillation: Secondary | ICD-10-CM | POA: Insufficient documentation

## 2020-03-08 DIAGNOSIS — Z87891 Personal history of nicotine dependence: Secondary | ICD-10-CM | POA: Diagnosis not present

## 2020-03-08 DIAGNOSIS — Z7901 Long term (current) use of anticoagulants: Secondary | ICD-10-CM | POA: Diagnosis not present

## 2020-03-08 DIAGNOSIS — G4730 Sleep apnea, unspecified: Secondary | ICD-10-CM | POA: Insufficient documentation

## 2020-03-08 DIAGNOSIS — I484 Atypical atrial flutter: Secondary | ICD-10-CM | POA: Diagnosis not present

## 2020-03-08 DIAGNOSIS — D6869 Other thrombophilia: Secondary | ICD-10-CM | POA: Diagnosis not present

## 2020-03-08 DIAGNOSIS — M109 Gout, unspecified: Secondary | ICD-10-CM | POA: Insufficient documentation

## 2020-03-08 DIAGNOSIS — Z79899 Other long term (current) drug therapy: Secondary | ICD-10-CM | POA: Diagnosis not present

## 2020-03-09 ENCOUNTER — Encounter (HOSPITAL_COMMUNITY): Payer: Self-pay | Admitting: Nurse Practitioner

## 2020-03-09 NOTE — Progress Notes (Signed)
Found to be in Atrial Flutter with native 2:1 AV conduction and ventricular rate at 140 bpm, symptomatic and mildly hypotensive. Had a recent DC cardioversion. He is roughly 5 months out from RF ablation. Pacemaker interrogation confirms atrial flutter with cycle length 260 ms. Pacemaker function is otherwise normal, although approaching ERI (<6 months).  Overdriveburst  atrial  pacing using his device was attempted.  Engagement of the atrial circuit appeared to be consistently present at CL 220 ms or shorter, but the AFlutter consistently reorganized. Shorter and shorter cycle lengths were tried. After pacing at 160 ms, the rhythm promptly converted to .sfi. rate control was much better at 98 bpm. BP improved.  Will restart amiodarone and tentatively schedule for another DC CV. May need a "touch-up" ablation.Found to be in Atrial Flutter with native 2:1 AV conduction and ventricular rate at 140 bpm, symptomatic and mildly hypotensive. Had a recent DC cardioversion. He is roughly 5 months out from RF ablation. Pacemaker interrogation confirms atrial flutter with cycle length 260 ms. Pacemaker function is otherwise normal, although approaching ERI (<6 months).  Overdriveburst  atrial  pacing using his device was attempted.  Engagement of the atrial circuit appeared to be consistently present at CL 220 ms or shorter, but the AFlutter consistently reorganized. Shorter and shorter cycle lengths were tried. After pacing at 160 ms, the rhythm promptly converted to .sfi. rate control was much better at 98 bpm. BP improved.  Will restart amiodarone and tentatively schedule for another DC CV. May need a "touch-up" ablation.Found to be in Atrial Flutter with native 2:1 AV conduction and ventricular rate at 140 bpm, symptomatic and mildly hypotensive. Had a recent DC cardioversion. He is roughly 5 months out from RF ablation. Pacemaker interrogation confirms atrial flutter with cycle length 260 ms. Pacemaker  function is otherwise normal, although approaching ERI (<6 months).  Overdriveburst  atrial  pacing using his device was attempted.  Engagement of the atrial circuit appeared to be consistently present at CL 220 ms or shorter, but the AFlutter consistently reorganized. Shorter and shorter cycle lengths were tried. After pacing at 160 ms, the rhythm promptly converted to .sfi. rate control was much better at 98 bpm. BP improved.  Will restart amiodarone and tentatively schedule for another DC CV. May need a "touch-up" ablation.Thanks, Shane [name]   Primary Care Physician: Shane Billet, MD Referring Physician: Dr. 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 He was seen in the office by Dr. Loletha Grayer and he attempted to pace him out which was unsuccessful. On f/u here if he was still in out of rhythm, cardioversion was to be scheduled. Ekg shows atrial flutter at 118 bpm. .  He continues on eliquis 5 mg bid for CHA2DS2VASc score of 4. He is mildly symptomatic.  F/u in afib clinic, 7/6, one week s/p successful  cardioversion.  Unfortunately, he is in atrial flutter at a rate around 150 bpm and mildly  hypotensive. Pt feels slightly lightheaded and mild fatgiue. He has felt ok for the last several days and has not checked his HR. Dr. Recardo Evangelist in and attempted overdrive pacing, see his note above. Unfortunately, he did not return  to SR but to a slower afib at a rate of 90 bpm.  Bp came up into the 24'Q systolic with pt feeling better. Denies  being lightheaded now. He had a prior afib  ablation 04/2017. After discussion with Dr. Loletha Grayer. He will be started on amiodarone since BP is a concern.   F/u 7/8. He remains in a flutter by EKG at 102 bpm, but by pulse ox HR is in the 80's to low 90's. He had an episode of sweating after his shower this am but sat under the fan to cool off.  He is fatigued. He continues on amiodarone 200 mg bid. He wanted to go to the beach today but I would feel better if he would  wait a week.   Today, he denies symptoms of palpitations, chest pain,+ lightheadedness, no   shortness of breath, orthopnea, PND, lower extremity edema, dizziness, presyncope, syncope, or neurologic sequela.+ fatigue. The patient is tolerating medications without difficulties and is otherwise without complaint today.   Past Medical History:  Diagnosis Date  . Atrioventricular block   . Degenerative joint disease   . Gout   . Paroxysmal atrial fibrillation (HCC)   . Presence of permanent cardiac pacemaker 03/12/10   guidant  ([place])  . Sinus node dysfunction (HCC)   . Sleep apnea    CPAP  . 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 ATRIAL FIBRILLATION ABLATION N/A 04/03/2017   Procedure: Atrial Fibrillation Ablation;  Surgeon: Constance Haw, MD;  Location: [facility];  Service: Cardiovascular;  Laterality: N/A;  . CARDIAC CATHETERIZATION  10/13/1994   No CAD  . CARDIAC CATHETERIZATION  05/10/2003   No CAD  . CARDIOVERSION N/A 10/21/2016   Procedure: CARDIOVERSION;  Surgeon: Sanda Klein, MD;  Location: [facility];  Service: Cardiovascular;  Laterality: N/A;  . CARDIOVERSION N/A 05/15/2017   Procedure: CARDIOVERSION;  Surgeon: Fay Records, MD;  Location: [facility];  Service: Cardiovascular;  Laterality: N/A;  . CARDIOVERSION N/A 10/21/2019   Procedure: CARDIOVERSION;  Surgeon: Sanda Klein, MD;  Location: [facility];  Service: Cardiovascular;  Laterality: N/A;  . CARDIOVERSION N/A 02/24/2020   Procedure: CARDIOVERSION;  Surgeon: Lelon Perla, MD;  Location: [facility];  Service: Cardiovascular;  Laterality: N/A;  . CATARACT EXTRACTION W/PHACO Left 05/18/2019   Procedure: CATARACT EXTRACTION PHACO AND INTRAOCULAR LENS PLACEMENT ([place]) LEFT  1:21 11.0% 9.01;  Surgeon:  Leandrew Koyanagi, MD;  Location: [facility];  Service: Ophthalmology;  Laterality: Left;  sleep apnea  . CATARACT EXTRACTION W/PHACO Right 06/29/2019   Procedure: CATARACT EXTRACTION PHACO AND INTRAOCULAR LENS PLACEMENT ([facility]) RIGHT  01:14.2  14.5%  10.92;  Surgeon: Leandrew Koyanagi, MD;  Location: [facility];  Service: Ophthalmology;  Laterality: Right;  sleep apnea  . CYST EXCISION  03/2019   on back   . NM MYOCAR PERF WALL MOTION  05/07/2011   Lexiscan: No ishcemia  . PERMANENT PACEMAKER INSERTION  03/12/2010   guidant  . ROTATOR CUFF REPAIR  1996  . SHOULDER ARTHROSCOPY    . US ECHOCARDIOGRAPHY  05/07/2011   mod. LVH,LA mod. dilated,borderline aortic root dilatation    Current Outpatient Medications  Medication Sig Dispense Refill  . allopurinol (ZYLOPRIM) 300 MG tablet Take 1 tablet (300 mg total) by mouth daily. 30 tablet 2  . amiodarone (PACERONE) 200 MG tablet Take 1 tablet by mouth twice a day for 1 month then reduce to 1 tablet a day 60 tablet 0  . anastrozole (ARIMIDEX) 1 MG tablet Take 1 mg by mouth daily.     Marland Kitchen apixaban (ELIQUIS)  5 MG TABS tablet Take 1 tablet (5 mg total) by mouth 2 (two) times daily. 60 tablet 11  . Artificial Tear Ointment (DRY EYES OP) Place 1 drop into both eyes as needed (dry eyes).     . Ascorbic Acid (VITAMIN C) 1000 MG tablet Take 1,000 mg by mouth daily.    . Cholecalciferol (VITAMIN D-3) 1000 UNITS CAPS Take 1,000 Units by mouth daily.     . citalopram (CELEXA) 40 MG tablet Take 1 tablet (40 mg total) by mouth daily. MUST RECEIVE FUTURE REFILLS FROM PCP. (Patient taking differently: Take 40 mg by mouth daily. ) 30 tablet 0  . co-enzyme Q-10 50 MG capsule Take 50 mg by mouth daily.     . colchicine 0.6 MG tablet Take 0.5 tablets (0.3 mg total) by mouth daily as needed. (Patient taking differently: Take 0.3 mg by mouth as needed (gout). ) 30 tablet 2  . diltiazem (CARDIZEM CD) 120 MG 24 hr capsule Take 1 capsule (120 mg total) by  mouth daily. 30 capsule 3  . furosemide (LASIX) 40 MG tablet Take 1.5 tablets (60 mg total) by mouth daily. 45 tablet 3  . montelukast (SINGULAIR) 10 MG tablet Take 10 mg by mouth at bedtime.    . Omega-3 Fatty Acids (FISH OIL) 1200 MG CAPS Take 1,200 mg by mouth 2 (two) times daily.     Marland Kitchen OVER THE COUNTER MEDICATION Take 100 mg by mouth daily. CBD oil    . potassium chloride SA (KLOR-CON) 20 MEQ tablet Take 1 tablet (20 mEq total) by mouth daily. 90 tablet 3  . psyllium (HYDROCIL/METAMUCIL) 95 % PACK Take 1 packet by mouth at bedtime.    . pyridOXINE (VITAMIN B-6) 100 MG tablet Take 100 mg by mouth daily.    . tamsulosin (FLOMAX) 0.4 MG CAPS capsule Take 1 capsule (0.4 mg total) by mouth daily. 30 capsule 2  . Testosterone 1.62 % GEL Apply 3 Pump topically daily. Shoulders  1  . vitamin B-12 (CYANOCOBALAMIN) 1000 MCG tablet Take 1,000 mcg by mouth daily.    Marland Kitchen zinc gluconate 50 MG tablet Take 50 mg by mouth daily.     No current facility-administered medications for this encounter.    No Known Allergies  Social History   Socioeconomic History  . Marital status: Married    Spouse name: Not on file  . Number of children: Not on file  . Years of education: Not on file  . Highest education level: Not on file  Occupational History  . Not on file  Tobacco Use  . Smoking status: Former Smoker    Types: Cigars    Quit date: 05/11/1993    Years since quitting: 26.8  . Smokeless tobacco: Former Systems developer    Types: Chew    Quit date: 05/11/1993  Vaping Use  . Vaping Use: Never used  Substance and Sexual Activity  . Alcohol use: Not Currently    Comment: social  . Drug use: No  . Sexual activity: Not on file  Other Topics Concern  . Not on file  Social History Narrative  . Not on file   Social Determinants of Health   Financial Resource Strain:   . Difficulty of Paying Living Expenses:   Food Insecurity:   . Worried About Charity fundraiser in the Last Year:   . Arboriculturist in  the Last Year:   Transportation Needs:   . Film/video editor (Medical):   Marland Kitchen Lack  of Transportation (Non-Medical):   Physical Activity:   . Days of Exercise per Week:   . Minutes of Exercise per Session:   Stress:   . Feeling of Stress :   Social Connections:   . Frequency of Communication with Friends and Family:   . Frequency of Social Gatherings with Friends and Family:   . Attends Religious Services:   . Active Member of Clubs or Organizations:   . Attends Archivist Meetings:   Marland Kitchen Marital Status:   Intimate Partner Violence:   . Fear of Current or Ex-Partner:   . Emotionally Abused:   Marland Kitchen Physically Abused:   . Sexually Abused:     Family History  Problem Relation Age of Onset  . Stroke Mother   . Heart failure Mother   . Alzheimer's disease Father   . Heart disease Sister   . Dementia Sister     ROS- All systems are reviewed and negative except as per the HPI above  Physical Exam: Vitals:   03/08/20 1404  BP: (!) 106/56  Pulse: (!) 102   Wt Readings from Last 3 Encounters:  03/06/20 120.6 kg  02/08/20 121.8 kg  02/06/20 121.2 kg    Labs: Lab Results  Component Value Date   NA 141 02/24/2020   K 4.3 02/24/2020   CL 100 02/24/2020   CO2 31 10/04/2019   GLUCOSE 103 (H) 02/24/2020   BUN 19 02/24/2020   CREATININE 1.20 02/24/2020   CALCIUM 9.5 10/04/2019   MG 2.0 04/22/2017   Lab Results  Component Value Date   INR 1.1 10/14/2016   No results found for: CHOL, HDL, LDLCALC, TRIG   GEN- The patient is well appearing, alert and oriented x 3 today.   Head- normocephalic, atraumatic Eyes-  Sclera clear, conjunctiva pink Ears- hearing intact Oropharynx- clear Neck- supple, no JVP Lymph- no cervical lymphadenopathy Lungs- Clear to ausculation bilaterally, normal work of breathing Heart- Rapid irregular rate and rhythm, no murmurs, rubs or gallops, PMI not laterally displaced GI- soft, NT, ND, + BS Extremities- no clubbing, cyanosis, or  edema MS- no significant deformity or atrophy Skin- no rash or lesion Psych- euthymic mood, full affect Neuro- strength and sensation are intact  EKG- atrial flutter at 102 bpm     Assessment and Plan: 1. Afib  S/p ablation in 2018  Recent successful cardioversion in February  2021 and again 6/25 but ERAF Continue  eliquis 5 mg bid for CHA2DS2VASc score of 4 Over drive  pacing attempt  by Dr. Loletha Grayer turned rapid a flutter to a more  manageable afib at 90 bpm on last visit  After discussion with Dr. Loletha Grayer, started amiodarone at 200 mg bid and get another appointment with Dr. Curt Bears  in several weeks to see if a re touch  ablation candidate.  Continue diltiazem 120 mg qd  Echo 12/19/19 showed  left atrium severely dilated at 5.20 cm   2. CHA2DS2VASc score of 4 Continue  eiquis 5 mg bid   Will see back in one week, if feels better with good rate control he may be able to go ahead with his beach plans.    Geroge Baseman Mathea Frieling, [facility] [address] [place], [place] [address] [phone]

## 2020-03-14 ENCOUNTER — Telehealth (HOSPITAL_COMMUNITY): Payer: Self-pay | Admitting: *Deleted

## 2020-03-14 NOTE — Telephone Encounter (Signed)
Pt called with update of HR/BP since starting amiodarone - feels great compared to last week. HRs ranging from 77-121 BP 83-125/58-71. Per Roderic Palau NP ok to go to beach and we will see in 2 weeks for follow up. Pt in agreement.

## 2020-03-16 ENCOUNTER — Encounter (HOSPITAL_COMMUNITY): Payer: BC Managed Care – PPO | Admitting: Nurse Practitioner

## 2020-03-28 ENCOUNTER — Other Ambulatory Visit: Payer: Self-pay | Admitting: Cardiovascular Disease

## 2020-03-28 ENCOUNTER — Other Ambulatory Visit: Payer: Self-pay

## 2020-03-28 ENCOUNTER — Encounter (HOSPITAL_COMMUNITY): Payer: Self-pay | Admitting: Nurse Practitioner

## 2020-03-28 ENCOUNTER — Ambulatory Visit (HOSPITAL_COMMUNITY)
Admission: RE | Admit: 2020-03-28 | Discharge: 2020-03-28 | Disposition: A | Discharge status: Home / Self Care | Payer: BC Managed Care – PPO | Source: Ambulatory Visit | Attending: Nurse Practitioner | Admitting: Nurse Practitioner

## 2020-03-28 VITALS — BP 108/76 | HR 99 | Ht 71.0 in | Wt 265.4 lb

## 2020-03-28 DIAGNOSIS — Z7901 Long term (current) use of anticoagulants: Secondary | ICD-10-CM | POA: Diagnosis not present

## 2020-03-28 DIAGNOSIS — I4819 Other persistent atrial fibrillation: Secondary | ICD-10-CM | POA: Diagnosis not present

## 2020-03-28 DIAGNOSIS — Z950 Presence of cardiac pacemaker: Secondary | ICD-10-CM | POA: Insufficient documentation

## 2020-03-28 DIAGNOSIS — I480 Paroxysmal atrial fibrillation: Secondary | ICD-10-CM | POA: Insufficient documentation

## 2020-03-28 DIAGNOSIS — Z87891 Personal history of nicotine dependence: Secondary | ICD-10-CM | POA: Diagnosis not present

## 2020-03-28 DIAGNOSIS — G4730 Sleep apnea, unspecified: Secondary | ICD-10-CM | POA: Insufficient documentation

## 2020-03-28 DIAGNOSIS — Z79899 Other long term (current) drug therapy: Secondary | ICD-10-CM | POA: Diagnosis not present

## 2020-03-28 DIAGNOSIS — Z9989 Dependence on other enabling machines and devices: Secondary | ICD-10-CM | POA: Diagnosis not present

## 2020-03-28 DIAGNOSIS — Z8249 Family history of ischemic heart disease and other diseases of the circulatory system: Secondary | ICD-10-CM | POA: Insufficient documentation

## 2020-03-28 LAB — COMPREHENSIVE METABOLIC PANEL
ALT: 22 U/L (ref 0–44)
AST: 34 U/L (ref 15–41)
Albumin: 3.9 g/dL (ref 3.5–5.0)
Alkaline Phosphatase: 53 U/L (ref 38–126)
Anion gap: 10 (ref 5–15)
BUN: 16 mg/dL (ref 8–23)
CO2: 28 mmol/L (ref 22–32)
Calcium: 9.5 mg/dL (ref 8.9–10.3)
Chloride: 99 mmol/L (ref 98–111)
Creatinine, Ser: 1.17 mg/dL (ref 0.61–1.24)
GFR calc Af Amer: >60 mL/min (ref >60)
GFR calc non Af Amer: >60 mL/min (ref >60)
Glucose, Bld: 100 mg/dL — ABNORMAL HIGH (ref 70–99)
Potassium: 4.2 mmol/L (ref 3.5–5.1)
Sodium: 137 mmol/L (ref 135–145)
Total Bilirubin: 1.2 mg/dL (ref 0.3–1.2)
Total Protein: 6.9 g/dL (ref 6.5–8.1)

## 2020-03-28 LAB — CBC
HCT: 46.6 % (ref 39.0–52.0)
Hemoglobin: 15.4 g/dL (ref 13.0–17.0)
MCH: 31.6 pg (ref 26.0–34.0)
MCHC: 33 g/dL (ref 30.0–36.0)
MCV: 95.7 fL (ref 80.0–100.0)
Platelets: 227 10*3/uL (ref 150–400)
RBC: 4.87 MIL/uL (ref 4.22–5.81)
RDW: 14.2 % (ref 11.5–15.5)
WBC: 5.9 10*3/uL (ref 4.0–10.5)
nRBC: 0 % (ref 0.0–0.2)

## 2020-03-28 LAB — TSH: TSH: 4.669 u[IU]/mL — ABNORMAL HIGH (ref 0.350–4.500)

## 2020-03-28 MED ORDER — AMIODARONE HCL 200 MG PO TABS: ORAL_TABLET | ORAL | 0 refills | Status: DC

## 2020-03-28 NOTE — Patient Instructions (Signed)
Cardioversion scheduled for Wednesday, August 11th  - Arrive at the [facility] and go to admitting at 10:30AM  - Do not eat or drink anything after midnight the night prior to your procedure.  - Take all your morning medication (except diabetic medications) with a sip of water prior to arrival.  - You will not be able to drive home after your procedure.  - Do NOT miss any doses of your blood thinner - if you should miss a dose please notify our office immediately.  Continue amiodarone 200mg  twice a day until follow up with Dr. Loletha Grayer Scheduling will call to change appt with Dr. Loletha Grayer until after cardioversion.

## 2020-03-28 NOTE — Progress Notes (Addendum)
Primary Care Physician: Shane Billet, MD Referring Physician: Dr. 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 male with a h/o PPM,  paroxysmal afib, s/p ablation in 2018 and being seen in f/u in afib clinic for return to atrial flutter. He was seen in the office by Dr. Loletha [name] and he attempted to pace him out which was unsuccessful. On f/u here if he was still in out of rhythm, cardioversion was to be scheduled. Ekg shows atrial flutter at 118 bpm. .  He continues on eliquis 5 mg bid for CHA2DS2VASc score of 4. He is mildly symptomatic.  F/u in afib clinic, 7/6, one week s/p successful  cardioversion.  Unfortunately, he is in atrial flutter at a rate around 150 bpm and mildly  hypotensive. Pt feels slightly lightheaded and mild fatgiue. He has felt ok for the last several days and has not checked his HR. Dr. Recardo [name] in and attempted overdrive pacing, see his note above. Unfortunately, he did not return  to SR but to a slower afib at a rate of 90 bpm.  Bp came up into the 69'C systolic with pt feeling better. Denies being lightheaded now. He had a prior afib  ablation 04/2017. After discussion with Dr. Loletha [name]. He will be started on amiodarone since BP is a concern.   F/u 7/8. He remains in a flutter by EKG at 102 bpm, but by pulse ox HR is in the 80's to low 90's. He had an episode of sweating after his shower this am but sat under the fan to cool off. He is fatigued. He continues on amiodarone 200 mg bid. He wanted to go to the beach today but I would feel better if he would  wait a week.   F/u 7/28. He continues in rate controlled afib. Still loading on amiodarone. He will be on amiodarone x 4 weeks on 8/2 and will eligible for cardioversion after that date. He wishes to  go back to the beach next  week and wants to set this up for the week of August 9 th.   Today, he denies symptoms of palpitations, chest pain,+ lightheadedness, no   shortness of breath, orthopnea, PND, lower extremity  edema, dizziness, presyncope, syncope, or neurologic sequela.+ fatigue. The patient is tolerating medications without difficulties and is otherwise without complaint today.   Past Medical History:  Diagnosis Date  . Atrioventricular block   . Degenerative joint disease   . Gout   . Paroxysmal atrial fibrillation (HCC)   . Presence of permanent cardiac pacemaker 03/12/10   guidant  (Shane [name])  . Sinus node dysfunction (HCC)   . Sleep apnea    CPAP  . 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 ATRIAL FIBRILLATION ABLATION N/A 04/03/2017   Procedure: Atrial Fibrillation Ablation;  Surgeon: Shane Haw, MD;  Location: [place] [facility];  Service: Cardiovascular;  Laterality: N/A;  . CARDIAC CATHETERIZATION  10/13/1994   No CAD  . CARDIAC CATHETERIZATION  05/10/2003   No CAD  . CARDIOVERSION N/A 10/21/2016   Procedure: CARDIOVERSION;  Surgeon: Shane Klein, MD;  Location: [facility] ENDOSCOPY;  Service: Cardiovascular;  Laterality: N/A;  . CARDIOVERSION N/A 05/15/2017   Procedure: CARDIOVERSION;  Surgeon: Shane Records, MD;  Location: [facility] ENDOSCOPY;  Service: Cardiovascular;  Laterality: N/A;  . CARDIOVERSION N/A 10/21/2019   Procedure: CARDIOVERSION;  Surgeon: Shane Klein, MD;  Location:  [place] ENDOSCOPY;  Service: Cardiovascular;  Laterality: N/A;  . CARDIOVERSION N/A 02/24/2020   Procedure: CARDIOVERSION;  Surgeon: Shane Perla, MD;  Location: [facility] ENDOSCOPY;  Service: Cardiovascular;  Laterality: N/A;  . CATARACT EXTRACTION W/PHACO Left 05/18/2019   Procedure: CATARACT EXTRACTION PHACO AND INTRAOCULAR LENS PLACEMENT (IOC) LEFT  1:21 11.0% 9.01;  Surgeon: Shane Koyanagi, MD;  Location: [place];  Service: Ophthalmology;  Laterality: Left;  sleep apnea  . CATARACT EXTRACTION W/PHACO Right 06/29/2019   Procedure: CATARACT EXTRACTION PHACO AND INTRAOCULAR LENS PLACEMENT (IOC) RIGHT  01:14.2  14.5%  10.92;  Surgeon:  Shane Koyanagi, MD;  Location: [place];  Service: Ophthalmology;  Laterality: Right;  sleep apnea  . CYST EXCISION  03/2019   on back   . NM MYOCAR PERF WALL MOTION  05/07/2011   Lexiscan: No ishcemia  . PERMANENT PACEMAKER INSERTION  03/12/2010   guidant  . ROTATOR CUFF REPAIR  1996  . SHOULDER ARTHROSCOPY    . US ECHOCARDIOGRAPHY  05/07/2011   mod. LVH,LA mod. dilated,borderline aortic root dilatation    Current Outpatient Medications  Medication Sig Dispense Refill  . allopurinol (ZYLOPRIM) 300 MG tablet Take 1 tablet (300 mg total) by mouth daily. 30 tablet 2  . amiodarone (PACERONE) 200 MG tablet Take 1 tablet by mouth twice a day for 1 month then reduce to 1 tablet a day 60 tablet 0  . anastrozole (ARIMIDEX) 1 MG tablet Take 1 mg by mouth daily.     Marland Kitchen apixaban (ELIQUIS) 5 MG TABS tablet Take 1 tablet (5 mg total) by mouth 2 (two) times daily. 60 tablet 11  . Artificial Tear Ointment (DRY EYES OP) Place 1 drop into both eyes as needed (dry eyes).     . Ascorbic Acid (VITAMIN C) 1000 MG tablet Take 1,000 mg by mouth daily.    . Cholecalciferol (VITAMIN D-3) 1000 UNITS CAPS Take 1,000 Units by mouth daily.     . citalopram (CELEXA) 40 MG tablet Take 1 tablet (40 mg total) by mouth daily. MUST RECEIVE FUTURE REFILLS FROM PCP. (Patient taking differently: Take 40 mg by mouth daily. ) 30 tablet 0  . co-enzyme Q-10 50 MG capsule Take 50 mg by mouth daily.     . colchicine 0.6 MG tablet Take 0.5 tablets (0.3 mg total) by mouth daily as needed. (Patient taking differently: Take 0.3 mg by mouth as needed (gout). ) 30 tablet 2  . diltiazem (CARDIZEM CD) 120 MG 24 hr capsule Take 1 capsule (120 mg total) by mouth daily. 30 capsule 3  . furosemide (LASIX) 40 MG tablet Take 1.5 tablets (60 mg total) by mouth daily. 45 tablet 3  . montelukast (SINGULAIR) 10 MG tablet Take 10 mg by mouth at bedtime.    . Omega-3 Fatty Acids (FISH OIL) 1200 MG CAPS Take 1,200 mg by mouth 2 (two) times  daily.     Marland Kitchen OVER THE COUNTER MEDICATION Take 100 mg by mouth daily. CBD oil    . Phenylephrine-APAP-guaiFENesin (MUCINEX SINUS-MAX CONG & PAIN PO) Take 2 tablets by mouth as needed.    . potassium chloride SA (KLOR-CON) 20 MEQ tablet Take 1 tablet (20 mEq total) by mouth daily. 90 tablet 3  . psyllium (HYDROCIL/METAMUCIL) 95 % PACK Take 1 packet by mouth at bedtime.    . pyridOXINE (VITAMIN B-6) 100 MG tablet Take 100 mg by mouth daily.    . tamsulosin (FLOMAX) 0.4 MG CAPS capsule Take 1 capsule (0.4 mg total) by  mouth daily. 30 capsule 2  . Testosterone 1.62 % GEL Apply 3 Pump topically daily. Shoulders  1  . vitamin B-12 (CYANOCOBALAMIN) 1000 MCG tablet Take 1,000 mcg by mouth daily.    Marland Kitchen zinc gluconate 50 MG tablet Take 50 mg by mouth daily.     No current facility-administered medications for this encounter.    No Known Allergies  Social History   Socioeconomic History  . Marital status: Married    Spouse name: Not on file  . Number of children: Not on file  . Years of education: Not on file  . Highest education level: Not on file  Occupational History  . Not on file  Tobacco Use  . Smoking status: Former Smoker    Types: Cigars    Quit date: 05/11/1993    Years since quitting: 26.8  . Smokeless tobacco: Former Systems developer    Types: Chew    Quit date: 05/11/1993  Vaping Use  . Vaping Use: Never used  Substance and Sexual Activity  . Alcohol use: Not Currently    Comment: social  . Drug use: No  . Sexual activity: Not on file  Other Topics Concern  . Not on file  Social History Narrative  . Not on file   Social Determinants of Health   Financial Resource Strain:   . Difficulty of Paying Living Expenses:   Food Insecurity:   . Worried About Charity fundraiser in the Last Year:   . Arboriculturist in the Last Year:   Transportation Needs:   . Film/video editor (Medical):   Marland Kitchen Lack of Transportation (Non-Medical):   Physical Activity:   . Days of Exercise per  Week:   . Minutes of Exercise per Session:   Stress:   . Feeling of Stress :   Social Connections:   . Frequency of Communication with Friends and Family:   . Frequency of Social Gatherings with Friends and Family:   . Attends Religious Services:   . Active Member of Clubs or Organizations:   . Attends Archivist Meetings:   Marland Kitchen Marital Status:   Intimate Partner Violence:   . Fear of Current or Ex-Partner:   . Emotionally Abused:   Marland Kitchen Physically Abused:   . Sexually Abused:     Family History  Problem Relation Age of Onset  . Stroke Mother   . Heart failure Mother   . Alzheimer's disease Father   . Heart disease Sister   . Dementia Sister     ROS- All systems are reviewed and negative except as per the HPI above  Physical Exam: Vitals:   03/28/20 1431  BP: 108/76  Pulse: 99  Weight: (!) 120.4 kg  Height: 5\' 11"  (1.803 m)   Wt Readings from Last 3 Encounters:  03/28/20 (!) 120.4 kg  03/06/20 120.6 kg  02/08/20 121.8 kg    Labs: Lab Results  Component Value Date   NA 141 02/24/2020   K 4.3 02/24/2020   CL 100 02/24/2020   CO2 31 10/04/2019   GLUCOSE 103 (H) 02/24/2020   BUN 19 02/24/2020   CREATININE 1.20 02/24/2020   CALCIUM 9.5 10/04/2019   MG 2.0 04/22/2017   Lab Results  Component Value Date   INR 1.1 10/14/2016   No results found for: CHOL, HDL, LDLCALC, TRIG   GEN- The patient is well appearing, alert and oriented x 3 today.   Head- normocephalic, atraumatic Eyes-  Sclera clear, conjunctiva pink Ears-  hearing intact Oropharynx- clear Neck- supple, no JVP Lymph- no cervical lymphadenopathy Lungs- Clear to ausculation bilaterally, normal work of breathing Heart-  irregular rate and rhythm, no murmurs, rubs or gallops, PMI not laterally displaced GI- soft, NT, ND, + BS Extremities- no clubbing, cyanosis, or edema MS- no significant deformity or atrophy Skin- no rash or lesion Psych- euthymic mood, full affect Neuro- strength and  sensation are intact  EKG- afib at 99 bpm, qrs int 122 ms, qtc 487 ms   Assessment and Plan: 1. Afib  S/p ablation in 2018  Recent successful cardioversion in February  2021 and again 02/24/20 but ERAF Continue  eliquis 5 mg bid for CHA2DS2VASc score of 4 Over drive  pacing attempt  by Dr. Loletha [name] turned rapid a flutter to a more  manageable afib at 90 bpm on last visit  After discussion with Dr. Loletha [name], started amiodarone at 200 mg bid and appointment was obtained  with Dr. Curt [name]  8/17  to see if a re touch  ablation candidate.  Continue diltiazem 120 mg qd  Echo 12/19/19 showed  left atrium severely dilated at 5.20 cm  Cardioversion scheduled for 8/11 F/u with Dr. Loletha [name] will be changed form 8/9 to 1-2 weeks after cardioversion I would think amiodarone would  be reduced to 200 mg daily at that time   2. CHA2DS2VASc score of 4 Continue  eiquis 5 mg bid, no missed doses   f/u with Dr. Recardo [name] as rescheduled after DCCV  and Dr. Curt [name]  8/17   Butch Penny C. Gustavia Carie, [facility] [address] [place], [place] [address] [phone]

## 2020-03-28 NOTE — H&P (View-Only) (Signed)
Primary Care Physician: Albina Billet, MD Referring Physician: Dr. 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 He was seen in the office by Dr. Loletha Grayer and he attempted to pace him out which was unsuccessful. On f/u here if he was still in out of rhythm, cardioversion was to be scheduled. Ekg shows atrial flutter at 118 bpm. .  He continues on eliquis 5 mg bid for CHA2DS2VASc score of 4. He is mildly symptomatic.  F/u in afib clinic, 7/6, one week s/p successful  cardioversion.  Unfortunately, he is in atrial flutter at a rate around 150 bpm and mildly  hypotensive. Pt feels slightly lightheaded and mild fatgiue. He has felt ok for the last several days and has not checked his HR. Dr. Recardo Evangelist in and attempted overdrive pacing, see his note above. Unfortunately, he did not return  to SR but to a slower afib at a rate of 90 bpm.  Bp came up into the 94'R systolic with pt feeling better. Denies being lightheaded now. He had a prior afib  ablation 04/2017. After discussion with Dr. Loletha Grayer. He will be started on amiodarone since BP is a concern.   F/u 7/8. He remains in a flutter by EKG at 102 bpm, but by pulse ox HR is in the 80's to low 90's. He had an episode of sweating after his shower this am but sat under the fan to cool off. He is fatigued. He continues on amiodarone 200 mg bid. He wanted to go to the beach today but I would feel better if he would  wait a week.   F/u 7/28. He continues in rate controlled afib. Still loading on amiodarone. He will be on amiodarone x 4 weeks on 8/2 and will eligible for cardioversion after that date. He wishes to  go back to the beach next  week and wants to set this up for the week of August 9 th.   Today, he denies symptoms of palpitations, chest pain,+ lightheadedness, no   shortness of breath, orthopnea, PND, lower extremity  edema, dizziness, presyncope, syncope, or neurologic sequela.+ fatigue. The patient is tolerating medications without difficulties and is otherwise without complaint today.   Past Medical History:  Diagnosis Date  . Atrioventricular block   . Degenerative joint disease   . Gout   . Paroxysmal atrial fibrillation (HCC)   . Presence of permanent cardiac pacemaker 03/12/10   guidant  ([place])  . Sinus node dysfunction (HCC)   . Sleep apnea    CPAP  . 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 ATRIAL FIBRILLATION ABLATION N/A 04/03/2017   Procedure: Atrial Fibrillation Ablation;  Surgeon: Constance Haw, MD;  Location: [place] [facility];  Service: Cardiovascular;  Laterality: N/A;  . CARDIAC CATHETERIZATION  10/13/1994   No CAD  . CARDIAC CATHETERIZATION  05/10/2003   No CAD  . CARDIOVERSION N/A 10/21/2016   Procedure: CARDIOVERSION;  Surgeon: Sanda Klein, MD;  Location[facility] ENDOSCOPY;  Service: Cardiovascular;  Laterality: N/A;  . CARDIOVERSION N/A 05/15/2017   Procedure: CARDIOVERSION;  Surgeon: Fay Records, MD;  Location: [facility] ENDOSCOPY;  Service: Cardiovascular;  Laterality: N/A;  . CARDIOVERSION N/A 10/21/2019   Procedure: CARDIOVERSION;  Surgeon: Sanda Klein, MD;  Location:  [place] ENDOSCOPY;  Service: Cardiovascular;  Laterality: N/A;  . CARDIOVERSION N/A 02/24/2020   Procedure: CARDIOVERSION;  Surgeon: Lelon Perla, MD;  Location: [facility] ENDOSCOPY;  Service: Cardiovascular;  Laterality: N/A;  . CATARACT EXTRACTION W/PHACO Left 05/18/2019   Procedure: CATARACT EXTRACTION PHACO AND INTRAOCULAR LENS PLACEMENT (IOC) LEFT  1:21 11.0% 9.01;  Surgeon: Leandrew Koyanagi, MD;  Location: [place];  Service: Ophthalmology;  Laterality: Left;  sleep apnea  . CATARACT EXTRACTION W/PHACO Right 06/29/2019   Procedure: CATARACT EXTRACTION PHACO AND INTRAOCULAR LENS PLACEMENT (IOC) RIGHT  01:14.2  14.5%  10.92;  Surgeon:  Leandrew Koyanagi, MD;  Location: [place];  Service: Ophthalmology;  Laterality: Right;  sleep apnea  . CYST EXCISION  03/2019   on back   . NM MYOCAR PERF WALL MOTION  05/07/2011   Lexiscan: No ishcemia  . PERMANENT PACEMAKER INSERTION  03/12/2010   guidant  . ROTATOR CUFF REPAIR  1996  . SHOULDER ARTHROSCOPY    . US ECHOCARDIOGRAPHY  05/07/2011   mod. LVH,LA mod. dilated,borderline aortic root dilatation    Current Outpatient Medications  Medication Sig Dispense Refill  . allopurinol (ZYLOPRIM) 300 MG tablet Take 1 tablet (300 mg total) by mouth daily. 30 tablet 2  . amiodarone (PACERONE) 200 MG tablet Take 1 tablet by mouth twice a day for 1 month then reduce to 1 tablet a day 60 tablet 0  . anastrozole (ARIMIDEX) 1 MG tablet Take 1 mg by mouth daily.     Marland Kitchen apixaban (ELIQUIS) 5 MG TABS tablet Take 1 tablet (5 mg total) by mouth 2 (two) times daily. 60 tablet 11  . Artificial Tear Ointment (DRY EYES OP) Place 1 drop into both eyes as needed (dry eyes).     . Ascorbic Acid (VITAMIN C) 1000 MG tablet Take 1,000 mg by mouth daily.    . Cholecalciferol (VITAMIN D-3) 1000 UNITS CAPS Take 1,000 Units by mouth daily.     . citalopram (CELEXA) 40 MG tablet Take 1 tablet (40 mg total) by mouth daily. MUST RECEIVE FUTURE REFILLS FROM PCP. (Patient taking differently: Take 40 mg by mouth daily. ) 30 tablet 0  . co-enzyme Q-10 50 MG capsule Take 50 mg by mouth daily.     . colchicine 0.6 MG tablet Take 0.5 tablets (0.3 mg total) by mouth daily as needed. (Patient taking differently: Take 0.3 mg by mouth as needed (gout). ) 30 tablet 2  . diltiazem (CARDIZEM CD) 120 MG 24 hr capsule Take 1 capsule (120 mg total) by mouth daily. 30 capsule 3  . furosemide (LASIX) 40 MG tablet Take 1.5 tablets (60 mg total) by mouth daily. 45 tablet 3  . montelukast (SINGULAIR) 10 MG tablet Take 10 mg by mouth at bedtime.    . Omega-3 Fatty Acids (FISH OIL) 1200 MG CAPS Take 1,200 mg by mouth 2 (two) times  daily.     Marland Kitchen OVER THE COUNTER MEDICATION Take 100 mg by mouth daily. CBD oil    . Phenylephrine-APAP-guaiFENesin (MUCINEX SINUS-MAX CONG & PAIN PO) Take 2 tablets by mouth as needed.    . potassium chloride SA (KLOR-CON) 20 MEQ tablet Take 1 tablet (20 mEq total) by mouth daily. 90 tablet 3  . psyllium (HYDROCIL/METAMUCIL) 95 % PACK Take 1 packet by mouth at bedtime.    . pyridOXINE (VITAMIN B-6) 100 MG tablet Take 100 mg by mouth daily.    . tamsulosin (FLOMAX) 0.4 MG CAPS capsule Take 1 capsule (0.4 mg total) by  mouth daily. 30 capsule 2  . Testosterone 1.62 % GEL Apply 3 Pump topically daily. Shoulders  1  . vitamin B-12 (CYANOCOBALAMIN) 1000 MCG tablet Take 1,000 mcg by mouth daily.    Marland Kitchen zinc gluconate 50 MG tablet Take 50 mg by mouth daily.     No current facility-administered medications for this encounter.    No Known Allergies  Social History   Socioeconomic History  . Marital status: Married    Spouse name: Not on file  . Number of children: Not on file  . Years of education: Not on file  . Highest education level: Not on file  Occupational History  . Not on file  Tobacco Use  . Smoking status: Former Smoker    Types: Cigars    Quit date: 05/11/1993    Years since quitting: 26.8  . Smokeless tobacco: Former Systems developer    Types: Chew    Quit date: 05/11/1993  Vaping Use  . Vaping Use: Never used  Substance and Sexual Activity  . Alcohol use: Not Currently    Comment: social  . Drug use: No  . Sexual activity: Not on file  Other Topics Concern  . Not on file  Social History Narrative  . Not on file   Social Determinants of Health   Financial Resource Strain:   . Difficulty of Paying Living Expenses:   Food Insecurity:   . Worried About Charity fundraiser in the Last Year:   . Arboriculturist in the Last Year:   Transportation Needs:   . Film/video editor (Medical):   Marland Kitchen Lack of Transportation (Non-Medical):   Physical Activity:   . Days of Exercise per  Week:   . Minutes of Exercise per Session:   Stress:   . Feeling of Stress :   Social Connections:   . Frequency of Communication with Friends and Family:   . Frequency of Social Gatherings with Friends and Family:   . Attends Religious Services:   . Active Member of Clubs or Organizations:   . Attends Archivist Meetings:   Marland Kitchen Marital Status:   Intimate Partner Violence:   . Fear of Current or Ex-Partner:   . Emotionally Abused:   Marland Kitchen Physically Abused:   . Sexually Abused:     Family History  Problem Relation Age of Onset  . Stroke Mother   . Heart failure Mother   . Alzheimer's disease Father   . Heart disease Sister   . Dementia Sister     ROS- All systems are reviewed and negative except as per the HPI above  Physical Exam: Vitals:   03/28/20 1431  BP: 108/76  Pulse: 99  Weight: (!) 120.4 kg  Height: 5\' 11"  (1.803 m)   Wt Readings from Last 3 Encounters:  03/28/20 (!) 120.4 kg  03/06/20 120.6 kg  02/08/20 121.8 kg    Labs: Lab Results  Component Value Date   NA 141 02/24/2020   K 4.3 02/24/2020   CL 100 02/24/2020   CO2 31 10/04/2019   GLUCOSE 103 (H) 02/24/2020   BUN 19 02/24/2020   CREATININE 1.20 02/24/2020   CALCIUM 9.5 10/04/2019   MG 2.0 04/22/2017   Lab Results  Component Value Date   INR 1.1 10/14/2016   No results found for: CHOL, HDL, LDLCALC, TRIG   GEN- The patient is well appearing, alert and oriented x 3 today.   Head- normocephalic, atraumatic Eyes-  Sclera clear, conjunctiva pink Ears-  hearing intact Oropharynx- clear Neck- supple, no JVP Lymph- no cervical lymphadenopathy Lungs- Clear to ausculation bilaterally, normal work of breathing Heart-  irregular rate and rhythm, no murmurs, rubs or gallops, PMI not laterally displaced GI- soft, NT, ND, + BS Extremities- no clubbing, cyanosis, or edema MS- no significant deformity or atrophy Skin- no rash or lesion Psych- euthymic mood, full affect Neuro- strength and  sensation are intact  EKG- afib at 99 bpm, qrs int 122 ms, qtc 487 ms   Assessment and Plan: 1. Afib  S/p ablation in 2018  Recent successful cardioversion in February  2021 and again 02/24/20 but ERAF Continue  eliquis 5 mg bid for CHA2DS2VASc score of 4 Over drive  pacing attempt  by Dr. Loletha Grayer turned rapid a flutter to a more  manageable afib at 90 bpm on last visit  After discussion with Dr. Loletha Grayer, started amiodarone at 200 mg bid and appointment was obtained  with Dr. Curt Bears  8/17  to see if a re touch  ablation candidate.  Continue diltiazem 120 mg qd  Echo 12/19/19 showed  left atrium severely dilated at 5.20 cm  Cardioversion scheduled for 8/11 F/u with Dr. Loletha Grayer will be changed form 8/9 to 1-2 weeks after cardioversion I would think amiodarone would  be reduced to 200 mg daily at that time   2. CHA2DS2VASc score of 4 Continue  eiquis 5 mg bid, no missed doses   f/u with Dr. Recardo Evangelist as rescheduled after DCCV  and Dr. Curt Bears  8/17   Butch Penny C. Madalynne Gutmann, [facility] [address] [place], [place] [address] [phone]

## 2020-03-28 NOTE — Progress Notes (Signed)
Thank you, Shane [name]. Good plan.

## 2020-03-28 NOTE — Addendum Note (Signed)
Encounter addended by: Sherran Needs, NP on: 03/28/2020 3:41 PM  Actions taken: Clinical Note Signed

## 2020-03-28 NOTE — Addendum Note (Signed)
Encounter addended by: Sherran Needs, NP on: 03/28/2020 3:25 PM  Actions taken: Clinical Note Signed

## 2020-04-07 ENCOUNTER — Other Ambulatory Visit: Payer: Self-pay | Admitting: Cardiovascular Disease

## 2020-04-09 ENCOUNTER — Other Ambulatory Visit: Payer: Self-pay

## 2020-04-09 ENCOUNTER — Encounter: Payer: BC Managed Care – PPO | Admitting: Cardiovascular Disease

## 2020-04-09 ENCOUNTER — Other Ambulatory Visit
Admission: RE | Admit: 2020-04-09 | Discharge: 2020-04-09 | Disposition: A | Discharge status: Home / Self Care | Payer: BC Managed Care – PPO | Source: Ambulatory Visit | Attending: Cardiovascular Disease | Admitting: Cardiovascular Disease

## 2020-04-09 DIAGNOSIS — Z20822 Contact with and (suspected) exposure to covid-19: Secondary | ICD-10-CM | POA: Diagnosis not present

## 2020-04-09 DIAGNOSIS — Z01812 Encounter for preprocedural laboratory examination: Secondary | ICD-10-CM | POA: Diagnosis not present

## 2020-04-10 LAB — SARS CORONAVIRUS 2 (TAT 6-24 HRS): SARS Coronavirus 2: NEGATIVE

## 2020-04-10 NOTE — Progress Notes (Signed)
Pre op call done for endo procedure tomorrow 04/11/20. Patient states he has been quarantined since Covid test, has not missed any doses of his blood thinner, and will take in morning before arrival to hospital, and confirmed has a ride home post procedure.

## 2020-04-11 ENCOUNTER — Encounter (HOSPITAL_COMMUNITY)
Admission: RE | Disposition: A | Discharge status: Home / Self Care | Payer: Self-pay | Source: Ambulatory Visit | Attending: Cardiovascular Disease

## 2020-04-11 ENCOUNTER — Ambulatory Visit (HOSPITAL_COMMUNITY): Payer: BC Managed Care – PPO | Admitting: Certified Registered Nurse Anesthetist

## 2020-04-11 ENCOUNTER — Ambulatory Visit (HOSPITAL_COMMUNITY)
Admission: RE | Admit: 2020-04-11 | Discharge: 2020-04-11 | Disposition: A | Discharge status: Home / Self Care | Payer: BC Managed Care – PPO | Source: Ambulatory Visit | Attending: Cardiovascular Disease | Admitting: Cardiovascular Disease

## 2020-04-11 ENCOUNTER — Other Ambulatory Visit: Payer: Self-pay

## 2020-04-11 ENCOUNTER — Encounter (HOSPITAL_COMMUNITY): Payer: Self-pay | Admitting: Cardiovascular Disease

## 2020-04-11 DIAGNOSIS — I4430 Unspecified atrioventricular block: Secondary | ICD-10-CM | POA: Insufficient documentation

## 2020-04-11 DIAGNOSIS — I4819 Other persistent atrial fibrillation: Secondary | ICD-10-CM | POA: Diagnosis not present

## 2020-04-11 DIAGNOSIS — M109 Gout, unspecified: Secondary | ICD-10-CM | POA: Diagnosis not present

## 2020-04-11 DIAGNOSIS — Z7901 Long term (current) use of anticoagulants: Secondary | ICD-10-CM | POA: Insufficient documentation

## 2020-04-11 DIAGNOSIS — Z87891 Personal history of nicotine dependence: Secondary | ICD-10-CM | POA: Diagnosis not present

## 2020-04-11 DIAGNOSIS — I480 Paroxysmal atrial fibrillation: Secondary | ICD-10-CM | POA: Diagnosis present

## 2020-04-11 DIAGNOSIS — G4730 Sleep apnea, unspecified: Secondary | ICD-10-CM | POA: Insufficient documentation

## 2020-04-11 DIAGNOSIS — Z950 Presence of cardiac pacemaker: Secondary | ICD-10-CM | POA: Insufficient documentation

## 2020-04-11 DIAGNOSIS — I495 Sick sinus syndrome: Secondary | ICD-10-CM | POA: Insufficient documentation

## 2020-04-11 DIAGNOSIS — Z79899 Other long term (current) drug therapy: Secondary | ICD-10-CM | POA: Insufficient documentation

## 2020-04-11 HISTORY — PX: CARDIOVERSION: SHX1299

## 2020-04-11 SURGERY — CARDIOVERSION
Anesthesia: General

## 2020-04-11 MED ORDER — LIDOCAINE 2% (20 MG/ML) 5 ML SYRINGE
INTRAMUSCULAR | Status: DC | PRN
  Administered 2020-04-11: 60 mg via INTRAVENOUS

## 2020-04-11 MED ORDER — SODIUM CHLORIDE 0.9 % IV SOLN
INTRAVENOUS | Status: DC
  Administered 2020-04-11: 500 mL via INTRAVENOUS

## 2020-04-11 MED ORDER — PROPOFOL 10 MG/ML IV BOLUS
INTRAVENOUS | Status: DC | PRN
  Administered 2020-04-11: 20 mg via INTRAVENOUS
  Administered 2020-04-11: 60 mg via INTRAVENOUS
  Administered 2020-04-11: 20 mg via INTRAVENOUS

## 2020-04-11 NOTE — Discharge Instructions (Signed)
You are OK to travel starting tomorrow.  Take it easy for the rest of the day.  Limit caffeine and alcohol intake.      Electrical Cardioversion Electrical cardioversion is the delivery of a jolt of electricity to restore a normal rhythm to the heart. A rhythm that is too fast or is not regular keeps the heart from pumping well. In this procedure, sticky patches or metal paddles are placed on the chest to deliver electricity to the heart from a device. Follow these instructions at home:  Do not drive for 24 hours if you were given a sedative during your procedure.  Take over-the-counter and prescription medicines only as told by your health care provider.  Ask your health care provider how to check your pulse. Check it often.  Rest for 48 hours after the procedure or as told by your health care provider.  Avoid or limit your caffeine use as told by your health care provider.  Keep all follow-up visits as told by your health care provider. This is important. Contact a health care provider if:  You feel like your heart is beating too quickly or your pulse is not regular.  You have a serious muscle cramp that does not go away. Get help right away if:  You have discomfort in your chest.  You are dizzy or you feel faint.  You have trouble breathing or you are short of breath.  Your speech is slurred.  You have trouble moving an arm or leg on one side of your body.  Your fingers or toes turn cold or blue. Summary  Electrical cardioversion is the delivery of a jolt of electricity to restore a normal rhythm to the heart.  This procedure may be done right away in an emergency or may be a scheduled procedure if the condition is not an emergency.  Generally, this is a safe procedure.  After the procedure, check your pulse often as told by your health care provider. This information is not intended to replace advice given to you by your health care provider. Make sure you discuss  any questions you have with your health care provider. Document Revised: 03/21/2019 Document Reviewed: 03/21/2019 Elsevier Patient Education  [place].

## 2020-04-11 NOTE — Interval H&P Note (Signed)
History and Physical Interval Note:  04/11/2020 11:22 AM  Shane [name]  has presented today for surgery, with the diagnosis of A-FIB.  The various methods of treatment have been discussed with the patient and family. After consideration of risks, benefits and other options for treatment, the patient has consented to  Procedure(s): CARDIOVERSION (N/A) as a surgical intervention.  The patient's history has been reviewed, patient examined, no change in status, stable for surgery.  I have reviewed the patient's chart and labs.  Questions were answered to the patient's satisfaction.     Skeet Latch, MD

## 2020-04-11 NOTE — Anesthesia Postprocedure Evaluation (Signed)
Anesthesia Post Note  Patient: Shane [name]  Procedure(s) Performed: CARDIOVERSION (N/A )     Patient location during evaluation: PACU Anesthesia Type: General Level of consciousness: awake and alert Pain management: pain level controlled Vital Signs Assessment: post-procedure vital signs reviewed and stable Respiratory status: spontaneous breathing, nonlabored ventilation, respiratory function stable and patient connected to nasal cannula oxygen Cardiovascular status: blood pressure returned to baseline and stable Postop Assessment: no apparent nausea or vomiting Anesthetic complications: no   No complications documented.  Last Vitals:  Vitals:   04/11/20 1150 04/11/20 1200  BP: 104/62 113/63  Pulse: (!) 59 60  Resp: 15 15  SpO2: 95% 97%    Last Pain:  Vitals:   04/11/20 1200  TempSrc:   PainSc: 0-No pain                 Catalina Gravel

## 2020-04-11 NOTE — CV Procedure (Signed)
Electrical Cardioversion Procedure Note EDMAR BLANKENBURG [id number] 09-01-1946  Procedure: Electrical Cardioversion Indications:  Atrial Fibrillation  Procedure Details Consent: Risks of procedure as well as the alternatives and risks of each were explained to the (patient/caregiver).  Consent for procedure obtained. Time Out: Verified patient identification, verified procedure, site/side was marked, verified correct patient position, special equipment/implants available, medications/allergies/relevent history reviewed, required imaging and test results available.  Performed  Patient placed on cardiac monitor, pulse oximetry, supplemental oxygen as necessary.  Sedation given: propofol Pacer pads placed anterior and posterior chest.  Cardioverted 1 time(s).  Cardioverted at [place].  Evaluation Findings: Post procedure EKG shows: NSR Complications: None Patient did tolerate procedure well.   Skeet Latch, MD 04/11/2020, 11:38 AM

## 2020-04-11 NOTE — Anesthesia Preprocedure Evaluation (Signed)
Anesthesia Evaluation  Patient identified by MRN, date of birth, ID band Patient awake    Reviewed: Allergy & Precautions, NPO status , Patient's Chart, lab work & pertinent test results  Airway Mallampati: II  TM Distance: >3 FB Neck ROM: Full    Dental  (+) Teeth Intact, Dental Advisory Given   Pulmonary sleep apnea , former smoker,    Pulmonary exam normal breath sounds clear to auscultation       Cardiovascular +CHF  + dysrhythmias Atrial Fibrillation + pacemaker  Rhythm:Irregular Rate:Abnormal     Neuro/Psych negative neurological ROS     GI/Hepatic negative GI ROS, Neg liver ROS,   Endo/Other  Obesity   Renal/GU negative Renal ROS     Musculoskeletal  (+) Arthritis ,   Abdominal   Peds  Hematology negative hematology ROS (+)   Anesthesia Other Findings Day of surgery medications reviewed with the patient.  Reproductive/Obstetrics                             Anesthesia Physical Anesthesia Plan  ASA: III  Anesthesia Plan: General   Post-op Pain Management:    Induction: Intravenous  PONV Risk Score and Plan: 2 and Propofol infusion and Treatment may vary due to age or medical condition  Airway Management Planned: Mask  Additional Equipment:   Intra-op Plan:   Post-operative Plan:   Informed Consent: I have reviewed the patients History and Physical, chart, labs and discussed the procedure including the risks, benefits and alternatives for the proposed anesthesia with the patient or authorized representative who has indicated his/her understanding and acceptance.     Dental advisory given  Plan Discussed with: CRNA  Anesthesia Plan Comments:         Anesthesia Quick Evaluation

## 2020-04-11 NOTE — Transfer of Care (Signed)
Immediate Anesthesia Transfer of Care Note  Patient: Shane [name]  Procedure(s) Performed: CARDIOVERSION (N/A )  Patient Location: Endoscopy Unit  Anesthesia Type:General  Level of Consciousness: drowsy  Airway & Oxygen Therapy: Patient Spontanous Breathing  Post-op Assessment: Report given to RN and Post -op Vital signs reviewed and stable  Post vital signs: Reviewed and stable  Last Vitals:  Vitals Value Taken Time  BP    Temp    Pulse    Resp    SpO2      Last Pain:  Vitals:   04/11/20 1111  TempSrc: Oral  PainSc: 0-No pain         Complications: No complications documented.

## 2020-04-12 ENCOUNTER — Encounter (HOSPITAL_COMMUNITY): Payer: Self-pay | Admitting: Cardiovascular Disease

## 2020-04-17 ENCOUNTER — Encounter: Payer: BC Managed Care – PPO | Admitting: Cardiology

## 2020-04-23 ENCOUNTER — Ambulatory Visit (INDEPENDENT_AMBULATORY_CARE_PROVIDER_SITE_OTHER): Payer: BC Managed Care – PPO | Admitting: Cardiovascular Disease

## 2020-04-23 ENCOUNTER — Other Ambulatory Visit: Payer: Self-pay

## 2020-04-23 ENCOUNTER — Encounter: Payer: Self-pay | Admitting: Cardiovascular Disease

## 2020-04-23 VITALS — BP 94/58 | HR 66 | Ht 71.0 in | Wt 261.8 lb

## 2020-04-23 DIAGNOSIS — G4733 Obstructive sleep apnea (adult) (pediatric): Secondary | ICD-10-CM

## 2020-04-23 DIAGNOSIS — I4819 Other persistent atrial fibrillation: Secondary | ICD-10-CM | POA: Diagnosis not present

## 2020-04-23 DIAGNOSIS — I10 Essential (primary) hypertension: Secondary | ICD-10-CM | POA: Diagnosis not present

## 2020-04-23 DIAGNOSIS — I495 Sick sinus syndrome: Secondary | ICD-10-CM

## 2020-04-23 DIAGNOSIS — I5032 Chronic diastolic (congestive) heart failure: Secondary | ICD-10-CM

## 2020-04-23 DIAGNOSIS — Z950 Presence of cardiac pacemaker: Secondary | ICD-10-CM

## 2020-04-23 MED ORDER — TAMSULOSIN HCL 0.4 MG PO CAPS: 0.4000 mg | ORAL_CAPSULE | Freq: Every day | ORAL | 4 refills | Status: DC

## 2020-04-23 MED ORDER — FUROSEMIDE 40 MG PO TABS: 60.0000 mg | ORAL_TABLET | Freq: Every day | ORAL | 4 refills | Status: DC

## 2020-04-23 MED ORDER — DILTIAZEM HCL ER COATED BEADS 120 MG PO CP24: 120.0000 mg | ORAL_CAPSULE | Freq: Every day | ORAL | 1 refills | Status: DC

## 2020-04-23 MED ORDER — AMIODARONE HCL 200 MG PO TABS: 200.0000 mg | ORAL_TABLET | Freq: Every day | ORAL | 4 refills | Status: DC

## 2020-04-23 MED ORDER — APIXABAN 5 MG PO TABS: 5.0000 mg | ORAL_TABLET | Freq: Two times a day (BID) | ORAL | 4 refills | Status: DC

## 2020-04-23 NOTE — Progress Notes (Signed)
Patient ID: TAITUM ALMS, male   DOB: Feb 13, 1946, 74 y.o.   MRN: [id number]     Cardiology Office Note    Date:  04/24/2020   ID:  Tsugio, Elison Jan 07, 1946, MRN [id number]  PCP:  Albina Billet, MD  Cardiologist:   Sanda Klein, MD   Chief Complaint  Patient presents with  . Atrial Fibrillation    History of Present Illness:  ADISA LITT is a 74 y.o. male who presents for recurrent persistent atrial fibrillation and pacemaker check.  He has a history of tachycardia-bradycardia syndrome, chronic diastolic heart failure (last echo April 2021 shows mildly depressed LVEF 45-50%), morbid obesity, obstructive sleep apnea on CPAP.  He underwent A. fib radiofrequency ablation on April 03, 2017.  He developed recurrent atrial fibrillation in February 2021 with a successful cardioversion on February 19.  He presented again in June with atrial flutter with rapid ventricular response.  Overdrive pacing was unsuccessful.  He underwent another cardioversion on June 25 but had early recurrence of atrial fibrillation.  Amiodarone was initiated on July 6 when he presented with atrial flutter with 2: 1 AV conduction and a ventricular rate of 140 bpm.  Overdrive pacing was again unsuccessful, but converted the arrhythmia to atrial fibrillation with a lower ventricular rate at about 100 bpm.  Then he had another cardioversion performed on August 11.  He returns today in normal rhythm.  He has completed the "loading dose" of amiodarone and is taking 200 mg daily.  He has not had any evident side effects from that medication to date.  He is also still taking diltiazem.  His pacemaker, which is not capable of remote downloads, has been reporting anticipated longevity under 6 months for more than 6 months now.  Interrogation of device otherwise shows normal function.  Since his last cardioversion on August 11 he has had 99% atrial pacing and 100% ventricular pacing with good heart rate histogram  distribution.  He has had problems with his CPAP.  He is trying to get a new mask since the old one does not fit well anymore.  Past Medical History:  Diagnosis Date  . Atrioventricular block   . Degenerative joint disease   . Gout   . Paroxysmal atrial fibrillation (HCC)   . Presence of permanent cardiac pacemaker 03/12/10   guidant  ([place])  . Sinus node dysfunction (HCC)   . Sleep apnea    CPAP  . Wears hearing aid in both ears     Past Surgical History:  Procedure Laterality Date  . ATRIAL FIBRILLATION ABLATION N/A 04/03/2017   Procedure: Atrial Fibrillation Ablation;  Surgeon: Constance Haw, MD;  Location: [place] [facility];  Service: Cardiovascular;  Laterality: N/A;  . CARDIAC CATHETERIZATION  10/13/1994   No CAD  . CARDIAC CATHETERIZATION  05/10/2003   No CAD  . CARDIOVERSION N/A 10/21/2016   Procedure: CARDIOVERSION;  Surgeon: Sanda Klein, MD;  Location: [facility] ENDOSCOPY;  Service: Cardiovascular;  Laterality: N/A;  . CARDIOVERSION N/A 05/15/2017   Procedure: CARDIOVERSION;  Surgeon: Fay Records, MD;  Location: [facility] ENDOSCOPY;  Service: Cardiovascular;  Laterality: N/A;  . CARDIOVERSION N/A 10/21/2019   Procedure: CARDIOVERSION;  Surgeon: Sanda Klein, MD;  Location: [facility] ENDOSCOPY;  Service: Cardiovascular;  Laterality: N/A;  . CARDIOVERSION N/A 02/24/2020   Procedure: CARDIOVERSION;  Surgeon: Lelon Perla, MD;  Location: [facility] ENDOSCOPY;  Service: Cardiovascular;  Laterality: N/A;  . CARDIOVERSION N/A 04/11/2020   Procedure: CARDIOVERSION;  Surgeon: Skeet Latch, MD;  Location: [facility] ENDOSCOPY;  Service: Cardiovascular;  Laterality: N/A;  . CATARACT EXTRACTION W/PHACO Left 05/18/2019   Procedure: CATARACT EXTRACTION PHACO AND INTRAOCULAR LENS PLACEMENT (IOC) LEFT  1:21 11.0% 9.01;  Surgeon: Leandrew Koyanagi, MD;  Location: [place];  Service: Ophthalmology;  Laterality: Left;  sleep apnea  . CATARACT EXTRACTION W/PHACO Right 06/29/2019     Procedure: CATARACT EXTRACTION PHACO AND INTRAOCULAR LENS PLACEMENT (IOC) RIGHT  01:14.2  14.5%  10.92;  Surgeon: Leandrew Koyanagi, MD;  Location: [place];  Service: Ophthalmology;  Laterality: Right;  sleep apnea  . CYST EXCISION  03/2019   on back   . NM MYOCAR PERF WALL MOTION  05/07/2011   Lexiscan: No ishcemia  . PERMANENT PACEMAKER INSERTION  03/12/2010   guidant  . ROTATOR CUFF REPAIR  1996  . SHOULDER ARTHROSCOPY    . US ECHOCARDIOGRAPHY  05/07/2011   mod. LVH,LA mod. dilated,borderline aortic root dilatation    Outpatient Medications Prior to Visit  Medication Sig Dispense Refill  . acetaminophen (TYLENOL) 650 MG CR tablet Take 1,300 mg by mouth every 8 (eight) hours as needed for pain.    Marland Kitchen allopurinol (ZYLOPRIM) 300 MG tablet Take 1 tablet (300 mg total) by mouth daily. 30 tablet 2  . anastrozole (ARIMIDEX) 1 MG tablet Take 1 mg by mouth daily.     . ARTIFICIAL TEAR SOLUTION OP Place 1 drop into both eyes daily as needed (dry eyes).    . Ascorbic Acid (VITAMIN C) 1000 MG tablet Take 1,000 mg by mouth daily.    . Cholecalciferol (VITAMIN D-3) 1000 UNITS CAPS Take 1,000 Units by mouth daily.     . citalopram (CELEXA) 40 MG tablet Take 1 tablet (40 mg total) by mouth daily. MUST RECEIVE FUTURE REFILLS FROM PCP. (Patient taking differently: Take 40 mg by mouth daily. ) 30 tablet 0  . co-enzyme Q-10 50 MG capsule Take 50 mg by mouth daily.     . colchicine 0.6 MG tablet Take 0.5 tablets (0.3 mg total) by mouth daily as needed. (Patient taking differently: Take 0.3 mg by mouth daily as needed (gout). ) 30 tablet 2  . Multiple Vitamin (MULTIVITAMIN WITH MINERALS) TABS tablet Take 1 tablet by mouth daily.    . Omega-3 Fatty Acids (FISH OIL) 1200 MG CAPS Take 1,200 mg by mouth 2 (two) times daily.     Marland Kitchen OVER THE COUNTER MEDICATION Take 100 mg by mouth daily. CBD oil    . Phenylephrine-APAP-guaiFENesin (MUCINEX SINUS-MAX CONG & PAIN PO) Take 2 tablets by mouth daily as  needed (sinus headaches).     . Phenylephrine-APAP-guaiFENesin (MUCINEX SINUS-MAX PO) Take 2 tablets by mouth at bedtime as needed (congestion).    . potassium chloride SA (KLOR-CON) 20 MEQ tablet Take 1 tablet (20 mEq total) by mouth daily. 90 tablet 3  . psyllium (HYDROCIL/METAMUCIL) 95 % PACK Take 1 packet by mouth at bedtime.    . pyridOXINE (VITAMIN B-6) 100 MG tablet Take 100 mg by mouth daily.    . sodium chloride (OCEAN) 0.65 % SOLN nasal spray Place 1 spray into both nostrils as needed for congestion.    . Testosterone 1.62 % GEL Apply 3 Pump topically daily. Shoulders  1  . vitamin B-12 (CYANOCOBALAMIN) 1000 MCG tablet Take 1,000 mcg by mouth daily.    Marland Kitchen zinc gluconate 50 MG tablet Take 50 mg by mouth daily.    Marland Kitchen amiodarone (PACERONE) 200 MG tablet Take 1 tablet by  mouth twice a day for 1 month then reduce to 1 tablet a day (Patient taking differently: Take 200 mg by mouth 2 (two) times daily. Take 1 tablet by mouth twice a day for 1 month then reduce to 1 tablet a day) 60 tablet 0  . apixaban (ELIQUIS) 5 MG TABS tablet Take 1 tablet (5 mg total) by mouth 2 (two) times daily. 60 tablet 11  . diltiazem (CARDIZEM CD) 120 MG 24 hr capsule Take 1 capsule (120 mg total) by mouth daily. 90 capsule 1  . furosemide (LASIX) 40 MG tablet Take 1.5 tablets (60 mg total) by mouth daily. 45 tablet 3  . tamsulosin (FLOMAX) 0.4 MG CAPS capsule Take 1 capsule (0.4 mg total) by mouth daily. 30 capsule 2   No facility-administered medications prior to visit.     Allergies:   Patient has no known allergies.   Social History   Socioeconomic History  . Marital status: Married    Spouse name: Not on file  . Number of children: Not on file  . Years of education: Not on file  . Highest education level: Not on file  Occupational History  . Not on file  Tobacco Use  . Smoking status: Former Smoker    Types: Cigars    Quit date: 05/11/1993    Years since quitting: 26.9  . Smokeless tobacco: Former  Systems developer    Types: Chew    Quit date: 05/11/1993  Vaping Use  . Vaping Use: Never used  Substance and Sexual Activity  . Alcohol use: Not Currently    Comment: social  . Drug use: No  . Sexual activity: Not on file  Other Topics Concern  . Not on file  Social History Narrative  . Not on file   Social Determinants of Health   Financial Resource Strain:   . Difficulty of Paying Living Expenses: Not on file  Food Insecurity:   . Worried About Charity fundraiser in the Last Year: Not on file  . Ran Out of Food in the Last Year: Not on file  Transportation Needs:   . Lack of Transportation (Medical): Not on file  . Lack of Transportation (Non-Medical): Not on file  Physical Activity:   . Days of Exercise per Week: Not on file  . Minutes of Exercise per Session: Not on file  Stress:   . Feeling of Stress : Not on file  Social Connections:   . Frequency of Communication with Friends and Family: Not on file  . Frequency of Social Gatherings with Friends and Family: Not on file  . Attends Religious Services: Not on file  . Active Member of Clubs or Organizations: Not on file  . Attends Archivist Meetings: Not on file  . Marital Status: Not on file     Family History:  The patient's family history includes Alzheimer's disease in his father; Dementia in his sister; Heart disease in his sister; Heart failure in his mother; Stroke in his mother.   ROS:   Please see the history of present illness.    ROS All other systems are reviewed and are negative.   PHYSICAL EXAM:   VS:  BP (!) 94/58   Pulse 66   Ht 5\' 11"  (1.803 m)   Wt 261 lb 12.8 oz (118.8 kg)   SpO2 94%   BMI 36.51 kg/m     General: Alert, oriented x3, no distress, Severely obese.  Healthy pacemaker site. Head: no evidence of  trauma, PERRL, EOMI, no exophtalmos or lid lag, no myxedema, no xanthelasma; normal ears, nose and oropharynx Neck: normal jugular venous pulsations and no hepatojugular reflux; brisk  carotid pulses without delay and no carotid bruits Chest: clear to auscultation, no signs of consolidation by percussion or palpation, normal fremitus, symmetrical and full respiratory excursions Cardiovascular: normal position and quality of the apical impulse, regular rhythm, normal first and paradoxically split second heart sounds, no murmurs, rubs or gallops Abdomen: no tenderness or distention, no masses by palpation, no abnormal pulsatility or arterial bruits, normal bowel sounds, no hepatosplenomegaly Extremities: no clubbing, cyanosis or edema; 2+ radial, ulnar and brachial pulses bilaterally; 2+ right femoral, posterior tibial and dorsalis pedis pulses; 2+ left femoral, posterior tibial and dorsalis pedis pulses; no subclavian or femoral bruits Neurological: grossly nonfocal Psych: Normal mood and affect   Wt Readings from Last 3 Encounters:  04/23/20 261 lb 12.8 oz (118.8 kg)  03/28/20 (!) 265 lb 6.4 oz (120.4 kg)  03/06/20 265 lb 12.8 oz (120.6 kg)      Studies/Labs Reviewed:   EKG:  EKG is ordered today and shows AV sequential pacing at a rate of 66 bpm.  ASSESSMENT:    1. Persistent atrial fibrillation ([place])   2. SSS (sick sinus syndrome) ([place])   3. Essential hypertension   4. Morbid obesity ([place])   5. Cardiac pacemaker in situ   6. Chronic diastolic heart failure ([place])   7. OSA (obstructive sleep apnea)      PLAN:  In order of problems listed above:  1.  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  Now on amiodarone and has been maintaining atrial fibrillation for the last couple of weeks since his last cardioversion.  Might need a "touchup" ablation.  Compliant with anticoagulation with Eliquis.  CHA2DS2-VASc 4 (age 18, CHF, hypertension).    2. SSS: Virtually 100% atrial paced when not in atrial  fibrillation, appropriate heart rate histogram distribution with the current sensor settings. 3. HTN: His blood pressure is actually fairly low, thankfully not symptomatic.  Once he is fully "loaded" on amiodarone with plan to stop his diltiazem 4. Obesity: We have discussed the relationship between weight and atrial fibrillation burden several times. 5. PM: His device, which is not capable of remote downloads, is very close to ERI and will be checking him every 3 months.  We will bring him back to the clinic for an appointment in 3 months.. 6. CHF: Currently with good functional status and no clinical evidence of hypervolemia.  His echocardiogram shows slight reduction in LVEF to 45-50%, and it would be highly desirable to avoid right ventricular pacing.  However, now that we have started amiodarone its unlikely we will be able to avoid ventricular pacing. 7. OSA: He has had recent problems with his CPAP mask and needs a new model, but otherwise reports 100% compliance with CPAP and he denies daytime hypersomnolence.  We were unable to estimate PA pressure on his last echo.  We will try to get him back into the sleep clinic with Dr. Claiborne Billings.     Medication Adjustments/Labs and Tests Ordered: Current medicines are reviewed at length with the patient today.  Concerns regarding medicines are outlined above.  Medication changes, Labs and Tests ordered today are listed in the Patient Instructions below. Patient Instructions  Medication  Instructions:  Amiodarone: Take one tablet, 200 mg once daily.   *If you need a refill on your cardiac medications before your next appointment, please call your pharmacy*   Lab Work: None ordered If you have labs (blood work) drawn today and your tests are completely normal, you will receive your results only by: Marland Kitchen MyChart Message (if you have MyChart) OR . A paper copy in the mail If you have any lab test that is abnormal or we need to change your treatment, we  will call you to review the results.   Testing/Procedures: None ordered   Follow-Up: At [facility], you and your health needs are our priority.  As part of our continuing mission to provide you with exceptional heart care, we have created designated Provider Care Teams.  These Care Teams include your primary Cardiologist (physician) and Advanced Practice Providers (APPs -  Physician Assistants and Nurse Practitioners) who all work together to provide you with the care you need, when you need it.  We recommend signing up for the patient portal called "MyChart".  Sign up information is provided on this After Visit Summary.  MyChart is used to connect with patients for Virtual Visits (Telemedicine).  Patients are able to view lab/test results, encounter notes, upcoming appointments, etc.  Non-urgent messages can be sent to your provider as well.   To learn more about what you can do with MyChart, go to [email or web address].    Your next appointment:   3 month(s) on a pacer day  The format for your next appointment:   In Person  Provider:   Sanda Klein, MD        Signed, Sanda Klein, MD  04/24/2020 5:38 PM    [address], [place], [place]  [id number] Phone: [phone]; Fax: [phone]

## 2020-04-23 NOTE — Patient Instructions (Signed)
Medication Instructions:  Amiodarone: Take one tablet, 200 mg once daily.   *If you need a refill on your cardiac medications before your next appointment, please call your pharmacy*   Lab Work: None ordered If you have labs (blood work) drawn today and your tests are completely normal, you will receive your results only by: Marland Kitchen MyChart Message (if you have MyChart) OR . A paper copy in the mail If you have any lab test that is abnormal or we need to change your treatment, we will call you to review the results.   Testing/Procedures: None ordered   Follow-Up: At [facility], you and your health needs are our priority.  As part of our continuing mission to provide you with exceptional heart care, we have created designated Provider Care Teams.  These Care Teams include your primary Cardiologist (physician) and Advanced Practice Providers (APPs -  Physician Assistants and Nurse Practitioners) who all work together to provide you with the care you need, when you need it.  We recommend signing up for the patient portal called "MyChart".  Sign up information is provided on this After Visit Summary.  MyChart is used to connect with patients for Virtual Visits (Telemedicine).  Patients are able to view lab/test results, encounter notes, upcoming appointments, etc.  Non-urgent messages can be sent to your provider as well.   To learn more about what you can do with MyChart, go to [email or web address].    Your next appointment:   3 month(s) on a pacer day  The format for your next appointment:   In Person  Provider:   Sanda Klein, MD

## 2020-04-24 ENCOUNTER — Encounter: Payer: Self-pay | Admitting: Cardiovascular Disease

## 2020-05-04 ENCOUNTER — Other Ambulatory Visit: Payer: Self-pay | Admitting: *Deleted

## 2020-05-04 DIAGNOSIS — G4733 Obstructive sleep apnea (adult) (pediatric): Secondary | ICD-10-CM

## 2020-07-16 ENCOUNTER — Telehealth: Payer: Self-pay | Admitting: Cardiovascular Disease

## 2020-07-16 NOTE — Telephone Encounter (Signed)
Radonna Ricker with [facility] is requesting to speak with Dr. Victorino December nurse to discuss the plan of care for the patient. She states if we are unable to call, we can just fax the patient's most recent office notes.  Phone#: [phone] Fax#: [phone]

## 2020-07-16 NOTE — Telephone Encounter (Signed)
Returned call to [place], Medical sales representative with [employer]. She states that she is going to be "checking in with pt from time to time" to make sure pt is compliant with meds, etc. She states that she is doing this to keep pt out of the hospital recently he was admitted for CHF flare and she thinks that she could help with this and other issues. She would like pt have most recent office note to review and see where to start. OV note faxed via Epic.

## 2020-07-23 ENCOUNTER — Other Ambulatory Visit: Payer: Self-pay

## 2020-07-23 ENCOUNTER — Ambulatory Visit (INDEPENDENT_AMBULATORY_CARE_PROVIDER_SITE_OTHER): Payer: BC Managed Care – PPO | Admitting: Cardiovascular Disease

## 2020-07-23 ENCOUNTER — Encounter: Payer: Self-pay | Admitting: Cardiovascular Disease

## 2020-07-23 VITALS — BP 136/64 | HR 60 | Ht 71.0 in | Wt 258.4 lb

## 2020-07-23 DIAGNOSIS — I495 Sick sinus syndrome: Secondary | ICD-10-CM

## 2020-07-23 DIAGNOSIS — G4733 Obstructive sleep apnea (adult) (pediatric): Secondary | ICD-10-CM

## 2020-07-23 DIAGNOSIS — I10 Essential (primary) hypertension: Secondary | ICD-10-CM

## 2020-07-23 DIAGNOSIS — I4819 Other persistent atrial fibrillation: Secondary | ICD-10-CM

## 2020-07-23 DIAGNOSIS — I5032 Chronic diastolic (congestive) heart failure: Secondary | ICD-10-CM

## 2020-07-23 DIAGNOSIS — Z01818 Encounter for other preprocedural examination: Secondary | ICD-10-CM | POA: Diagnosis not present

## 2020-07-23 DIAGNOSIS — Z45010 Encounter for checking and testing of cardiac pacemaker pulse generator [battery]: Secondary | ICD-10-CM

## 2020-07-23 DIAGNOSIS — Z6836 Body mass index (BMI) 36.0-36.9, adult: Secondary | ICD-10-CM

## 2020-07-23 LAB — BASIC METABOLIC PANEL
BUN/Creatinine Ratio: 14 (ref 10–24)
BUN: 14 mg/dL (ref 8–27)
CO2: 27 mmol/L (ref 20–29)
Calcium: 9.7 mg/dL (ref 8.6–10.2)
Chloride: 101 mmol/L (ref 96–106)
Creatinine, Ser: 1.03 mg/dL (ref 0.76–1.27)
GFR calc Af Amer: 82 mL/min/{1.73_m2} (ref >59)
GFR calc non Af Amer: 71 mL/min/{1.73_m2} (ref >59)
Glucose: 68 mg/dL (ref 65–99)
Potassium: 4.9 mmol/L (ref 3.5–5.2)
Sodium: 138 mmol/L (ref 134–144)

## 2020-07-23 LAB — CBC
Hematocrit: 45.3 % (ref 37.5–51.0)
Hemoglobin: 15.7 g/dL (ref 13.0–17.7)
MCH: 32.4 pg (ref 26.6–33.0)
MCHC: 34.7 g/dL (ref 31.5–35.7)
MCV: 93 fL (ref 79–97)
Platelets: 259 10*3/uL (ref 150–450)
RBC: 4.85 x10E6/uL (ref 4.14–5.80)
RDW: 13.4 % (ref 11.6–15.4)
WBC: 6.7 10*3/uL (ref 3.4–10.8)

## 2020-07-23 NOTE — H&P (View-Only) (Signed)
Patient ID: Shane [name], male   DOB: [birth year], 74 y.o.   MRN: [id number]     Cardiology Office Note    Date:  07/24/2020   [name], Shane [name] November 12, 1945, MRN [id number]  PCP:  Albina Billet, MD  Cardiologist:   Sanda Klein, MD   Chief Complaint  Patient presents with  . Pacemaker Problem    History of Present Illness:  Shane [name] is a 74 y.o. male who presents for recurrent persistent atrial fibrillation and pacemaker check.  He has a history of tachycardia-bradycardia syndrome, chronic diastolic heart failure (last echo April 2021 shows mildly depressed LVEF 45-50%), morbid obesity, obstructive sleep apnea on CPAP.  He underwent radiofrequency ablation for atrial fibrillation in August 2018 with a recurrent atrial fibrillation requiring cardioversion in February 2021 and June 2021, then started on amiodarone and underwent successful cardioversion with lasting benefit on April 11, 2020.  He has a Actor pacemaker, not capable of remote downloads, which is interrogated today and shows that it reached elective replacement indicator on November 8.  He has not had any recurrent atrial fibrillation since his cardioversion in August.  Lead parameters are good.  He reports improved compliance with CPAP.  He denies daytime hypersomnolence.  The patient specifically denies any chest pain at rest or with exertion, dyspnea at rest or with usual exertion, orthopnea, paroxysmal nocturnal dyspnea, syncope, palpitations, focal neurological deficits, intermittent claudication, lower extremity edema, unexplained weight gain, cough, hemoptysis or wheezing.  He has not had any falls or bleeding problems.   Past Medical History:  Diagnosis Date  . Atrioventricular block   . Degenerative joint disease   . Gout   . Paroxysmal atrial fibrillation (HCC)   . Presence of permanent cardiac pacemaker 03/12/10   guidant  ([place])  . Sinus node dysfunction  (HCC)   . Sleep apnea    CPAP  . Wears hearing aid in both ears     Past Surgical History:  Procedure Laterality Date  . ATRIAL FIBRILLATION ABLATION N/A 04/03/2017   Procedure: Atrial Fibrillation Ablation;  Surgeon: Constance Haw, MD;  Location: [facility];  Service: Cardiovascular;  Laterality: N/A;  . CARDIAC CATHETERIZATION  10/13/1994   No CAD  . CARDIAC CATHETERIZATION  05/10/2003   No CAD  . CARDIOVERSION N/A 10/21/2016   Procedure: CARDIOVERSION;  Surgeon: Sanda Klein, MD;  Location: [facility];  Service: Cardiovascular;  Laterality: N/A;  . CARDIOVERSION N/A 05/15/2017   Procedure: CARDIOVERSION;  Surgeon: Fay Records, MD;  Location: [place];  Service: Cardiovascular;  Laterality: N/A;  . CARDIOVERSION N/A 10/21/2019   Procedure: CARDIOVERSION;  Surgeon: Sanda Klein, MD;  [facility]: [facility];  Service: Cardiovascular;  Laterality: N/A;  . CARDIOVERSION N/A 02/24/2020   Procedure: CARDIOVERSION;  Surgeon: Lelon Perla, MD;  Location: [facility];  Service: Cardiovascular;  Laterality: N/A;  . CARDIOVERSION N/A 04/11/2020   Procedure: CARDIOVERSION;  Surgeon: Skeet Latch, MD;  Location: [facility];  Service: Cardiovascular;  Laterality: N/A;  . CATARACT EXTRACTION W/PHACO Left 05/18/2019   Procedure: CATARACT EXTRACTION PHACO AND INTRAOCULAR LENS PLACEMENT ([place]) LEFT  1:21 11.0% 9.01;  Surgeon: Leandrew Koyanagi, MD;  Location: [place];  Service: Ophthalmology;  Laterality: Left;  sleep apnea  . CATARACT EXTRACTION W/PHACO Right 06/29/2019   Procedure: CATARACT EXTRACTION PHACO AND INTRAOCULAR LENS PLACEMENT ([facility]) RIGHT  01:14.2  14.5%  10.92;  Surgeon: Leandrew Koyanagi, MD;  Location: [place];  Service: Ophthalmology;  Laterality: Right;  sleep apnea  . CYST EXCISION  03/2019   on back   . NM MYOCAR PERF WALL MOTION  05/07/2011   Lexiscan: No ishcemia  . PERMANENT PACEMAKER INSERTION  03/12/2010   guidant  .  ROTATOR CUFF REPAIR  1996  . SHOULDER ARTHROSCOPY    . US ECHOCARDIOGRAPHY  05/07/2011   mod. LVH,LA mod. dilated,borderline aortic root dilatation    Outpatient Medications Prior to Visit  Medication Sig Dispense Refill  . acetaminophen (TYLENOL) 650 MG CR tablet Take 1,300 mg by mouth every 8 (eight) hours as needed for pain.    Marland Kitchen allopurinol (ZYLOPRIM) 300 MG tablet Take 1 tablet (300 mg total) by mouth daily. 30 tablet 2  . amiodarone (PACERONE) 200 MG tablet Take 1 tablet (200 mg total) by mouth daily. 30 tablet 4  . anastrozole (ARIMIDEX) 1 MG tablet Take 1 mg by mouth daily.     Marland Kitchen apixaban (ELIQUIS) 5 MG TABS tablet Take 1 tablet (5 mg total) by mouth 2 (two) times daily. 60 tablet 4  . ARTIFICIAL TEAR SOLUTION OP Place 1 drop into both eyes daily as needed (dry eyes).    . Ascorbic Acid (VITAMIN C) 1000 MG tablet Take 1,000 mg by mouth daily.    . Cholecalciferol (VITAMIN D-3) 1000 UNITS CAPS Take 1,000 Units by mouth daily.     . citalopram (CELEXA) 40 MG tablet Take 1 tablet (40 mg total) by mouth daily. MUST RECEIVE FUTURE REFILLS FROM PCP. (Patient taking differently: Take 40 mg by mouth daily. ) 30 tablet 0  . co-enzyme Q-10 50 MG capsule Take 50 mg by mouth daily.     . colchicine 0.6 MG tablet Take 0.5 tablets (0.3 mg total) by mouth daily as needed. (Patient taking differently: Take 0.3 mg by mouth daily as needed (gout). ) 30 tablet 2  . diltiazem (CARDIZEM CD) 120 MG 24 hr capsule Take 1 capsule (120 mg total) by mouth daily. 90 capsule 1  . furosemide (LASIX) 40 MG tablet Take 1.5 tablets (60 mg total) by mouth daily. 45 tablet 4  . Multiple Vitamin (MULTIVITAMIN WITH MINERALS) TABS tablet Take 1 tablet by mouth daily.    . Omega-3 Fatty Acids (FISH OIL) 1200 MG CAPS Take 1,200 mg by mouth 2 (two) times daily.     Marland Kitchen OVER THE COUNTER MEDICATION Take 100 mg by mouth daily. CBD oil    . Phenylephrine-APAP-guaiFENesin (MUCINEX SINUS-MAX CONG & PAIN PO) Take 2 tablets by mouth  daily as needed (sinus headaches).     . Phenylephrine-APAP-guaiFENesin (MUCINEX SINUS-MAX PO) Take 2 tablets by mouth at bedtime as needed (congestion).    . potassium chloride SA (KLOR-CON) 20 MEQ tablet Take 1 tablet (20 mEq total) by mouth daily. 90 tablet 3  . psyllium (HYDROCIL/METAMUCIL) 95 % PACK Take 1 packet by mouth at bedtime.    . pyridOXINE (VITAMIN B-6) 100 MG tablet Take 100 mg by mouth daily.    . rosuvastatin (CRESTOR) 5 MG tablet Take 5 mg by mouth daily.    . sodium chloride (OCEAN) 0.65 % SOLN nasal spray Place 1 spray into both nostrils as needed for congestion.    . tamsulosin (FLOMAX) 0.4 MG CAPS capsule Take 1 capsule (0.4 mg total) by mouth daily. 30 capsule 4  . Testosterone 1.62 % GEL Apply 3 Pump topically daily. Shoulders  1  . vitamin B-12 (CYANOCOBALAMIN) 1000 MCG tablet Take 1,000 mcg by mouth daily.    Marland Kitchen  zinc gluconate 50 MG tablet Take 50 mg by mouth daily.     No facility-administered medications prior to visit.     Allergies:   Patient has no known allergies.   Social History   Socioeconomic History  . Marital status: Married    Spouse name: Not on file  . Number of children: Not on file  . Years of education: Not on file  . Highest education level: Not on file  Occupational History  . Not on file  Tobacco Use  . Smoking status: Former Smoker    Types: Cigars    Quit date: 05/11/1993    Years since quitting: 27.2  . Smokeless tobacco: Former Systems developer    Types: Chew    Quit date: 05/11/1993  Vaping Use  . Vaping Use: Never used  Substance and Sexual Activity  . Alcohol use: Not Currently    Comment: social  . Drug use: No  . Sexual activity: Not on file  Other Topics Concern  . Not on file  Social History Narrative  . Not on file   Social Determinants of Health   Financial Resource Strain:   . Difficulty of Paying Living Expenses: Not on file  Food Insecurity:   . Worried About Charity fundraiser in the Last Year: Not on file  . Ran  Out of Food in the Last Year: Not on file  Transportation Needs:   . Lack of Transportation (Medical): Not on file  . Lack of Transportation (Non-Medical): Not on file  Physical Activity:   . Days of Exercise per Week: Not on file  . Minutes of Exercise per Session: Not on file  Stress:   . Feeling of Stress : Not on file  Social Connections:   . Frequency of Communication with Friends and Family: Not on file  . Frequency of Social Gatherings with Friends and Family: Not on file  . Attends Religious Services: Not on file  . Active Member of Clubs or Organizations: Not on file  . Attends Archivist Meetings: Not on file  . Marital Status: Not on file     Family History:  The patient's family history includes Alzheimer's disease in his father; Dementia in his sister; Heart disease in his sister; Heart failure in his mother; Stroke in his mother.   ROS:   Please see the history of present illness.    All other systems are reviewed and are negative.   PHYSICAL EXAM:   VS:  BP 136/64   Pulse 60   Ht 5\' 11"  (1.803 m)   Wt 258 lb 6.4 oz (117.2 kg)   SpO2 97%   BMI 36.04 kg/m      General: Alert, oriented x3, no distress, severely obese.  Healthy left subclavian pacemaker site. Head: no evidence of trauma, PERRL, EOMI, no exophtalmos or lid lag, no myxedema, no xanthelasma; normal ears, nose and oropharynx Neck: normal jugular venous pulsations and no hepatojugular reflux; brisk carotid pulses without delay and no carotid bruits Chest: clear to auscultation, no signs of consolidation by percussion or palpation, normal fremitus, symmetrical and full respiratory excursions Cardiovascular: normal position and quality of the apical impulse, regular rhythm, normal first and paradoxically split second heart sounds, no murmurs, rubs or gallops Abdomen: no tenderness or distention, no masses by palpation, no abnormal pulsatility or arterial bruits, normal bowel sounds, no  hepatosplenomegaly Extremities: no clubbing, cyanosis or edema; 2+ radial, ulnar and brachial pulses bilaterally; 2+ right femoral, posterior tibial  and dorsalis pedis pulses; 2+ left femoral, posterior tibial and dorsalis pedis pulses; no subclavian or femoral bruits Neurological: grossly nonfocal Psych: Normal mood and affect    Wt Readings from Last 3 Encounters:  07/23/20 258 lb 6.4 oz (117.2 kg)  04/23/20 261 lb 12.8 oz (118.8 kg)  03/28/20 (!) 265 lb 6.4 oz (120.4 kg)      Studies/Labs Reviewed:   EKG:  EKG is ordered today and shows atrial paced, ventricular paced rhythm with a very broad QRS of 220 ms, QTC 540 ms  ASSESSMENT:    1. SSS (sick sinus syndrome) (HCC)   2. Persistent atrial fibrillation ([place])   3. Essential hypertension   4. Class 2 severe obesity due to excess calories with serious comorbidity and body mass index (BMI) of 36.0 to 36.9 in adult ([place])   5. Pacemaker battery depletion   6. Chronic diastolic heart failure ([place])   7. OSA (obstructive sleep apnea)   8. Pre-op testing      PLAN:  In order of problems listed above:  1.  AFib s/p RFA 04/03/2017: Now requiring amiodarone for maintenance of sinus rhythm, so far with good results for the last 3 months and without any evidence of toxicity.  Compliant with anticoagulation with Eliquis.  CHA2DS2-VASc 4 (age 65, CHF, hypertension).   No history of embolic events. 2. SSS: Virtually 100% atrial paced when not in atrial fibrillation.  Heart rate histogram distribution has appeared appropriate with current sensor settings. 3. HTN: Blood pressure control is fair, systolic slightly higher than desirable today.  Had planned to stop his diltiazem, but will continue for the time being until we determined that he does not need it for his hypertension. 4. Obesity: We have discussed the relationship between weight and atrial fibrillation burden several times.  He has lost about 7 pounds since July. 5. PM: Device reached  ERI a couple of weeks ago.  Currently has 100% atrial paced, ventricular paced rhythm, but when he was in atrial flutter few months ago he had AV conduction at about 120 bpm.  The degree of AV delay has increased since starting amiodarone, but is not pacemaker dependent.  When not in atrial arrhythmia has virtually 100% atrial paced rhythm.  Have scheduled for generator change out on December 6.  Discussed the proedure and possible complications in detail. This procedure has been fully reviewed with the patient and written informed consent has been obtained. 6. CHF: Good functional status, no hypervolemia on physical exam.  His echocardiogram shows slight reduction in LVEF to 45-50%, and it would be highly desirable to avoid right ventricular pacing.  With the current device settings we have been unable to avoid ventricular pacing, especially after starting amiodarone.  Hopefully with the expanded AV delay settings on the new device we may be able to reduce the frequency of ventricular pacing. 7. OSA: He reports 100% compliance with CPAP and he denies daytime hypersomnolence.  We were unable to estimate PA pressure on his last echo.  Have not yet been able to get him back in the sleep clinic with Dr. Claiborne Billings.     Medication Adjustments/Labs and Tests Ordered: Current medicines are reviewed at length with the patient today.  Concerns regarding medicines are outlined above.  Medication changes, Labs and Tests ordered today are listed in the Patient Instructions below. Patient Instructions  Medication Instructions: Your physician recommends that you continue on your current medications as directed. Please refer to the Current Medication list given to  you today.  * If you need a refill on your cardiac medications before your next appointment, please call your pharmacy. *  Labwork: Pre procedure lab work today: BMET & CBC  * Will notify you of abnormal results, otherwise continue current treatment  plan.*  Testing/Procedures: Your physician has recommended that you have a pacemaker/defibrillator generator change (battery change). Please follow the instructions below, located under the special instructions section.  Follow-Up: Your physician recommends that you schedule a wound check appointment 10-14 days, after your procedure on 08/06/20, with the device clinic.  Your physician recommends that you schedule a follow up appointment in 91 days, after your procedure on 08/06/20, with Dr. Sallyanne Kuster.  Thank you for choosing [facility]!!      Any Other Special Instructions Will Be Listed Below (If Applicable).    [facility] [place], [place]  [place], [place] [address]  Phone: [phone] Fax: [phone]    Generator Change Procedure Instructions  You are scheduled for a Generator Change (battery change) on  08/06/20  with Dr. Sallyanne Kuster.  1. Please arrive at the [facility], Entrance "A"  at [facility] at  11:30 am on the day of your procedure. (The address is [address])  2. DIET: You may have a light, early breakfast the morning of your procedure. NOTHING TO EAT AFTER 8:00 AM.  3. LABS: You will need to have the coronavirus test completed prior to your procedure. An appointment has been made [facility] on 08/02/20. Please go between the hours of 8am -1 pm.   4. MEDICATIONS: Hold the Eliquis the night before and the morning of the procedure.  5.  Plan for an overnight stay.  Bring your insurance cards and a list of you medications.  6.  Wash your chest and neck with surgical scrub the evening before and the morning of your procedure.  Rinse well. Please review the surgical scrub instruction sheet given to you.   7. Your chest will need to be shaved prior to this procedure (if needed). We ask that you do this yourself at home 1 to 2 days before or if uncomfortable/unable to do yourself, then it will be performed  by the hospital staff the day of.  * Special note:  Every effort is made to have your procedure done on time.  Occasionally there are emergencies that present themselves at the hospital that may cause delays.  Please be patient if a delay does occur.                                                                                                           * If you have any questions after you get home, please call Lattie Haw, RN at [phone].    [facility] - Preparing For Surgery  Before surgery, you can play an important role. Because skin is not sterile, your skin needs to be as free of germs as possible. You can reduce the number of germs on your skin by washing  with CHG (chlorahexidine gluconate) Soap before surgery.  CHG is an antiseptic cleaner which kills germs and bonds with the skin to continue killing germs even after washing.   Please do not use if you have an allergy to CHG or antibacterial soaps.  If your skin becomes reddened/irritated stop using the CHG.   Do not shave (including legs and underarms) for at least 48 hours prior to first CHG shower.  It is OK to shave your face.  Please follow these instructions carefully:  1.  Shower the night before surgery and the morning of surgery with CHG.  2.  If you choose to wash your hair, wash your hair first as usual with your normal shampoo.  3.  After you shampoo, rinse your hair and body thoroughly to remove the shampoo.  4.  Use CHG as you would any other liquid soap.  You can apply CHG directly to the skin and wash gently with a clean washcloth. 5.  Apply the CHG Soap to your body ONLY FROM THE NECK DOWN.  Do not use on open wounds or open sores.  Avoid contact with your eyes, ears, mouth and genitals (private parts).    6.  Wash thoroughly, paying special attention to the area where your surgery will be performed.  7.  Thoroughly rinse your body with warm water from the neck down.   8.  DO NOT shower/wash with your normal soap  after using and rinsing off the CHG soap.  9.  Pat yourself dry with a clean towel.   10.  Wear clean pajamas.   11.  Place clean sheets on your bed the night of your first shower and do not sleep with pets.  Day of Surgery: Do not apply any deodorants/lotions.  Please wear clean clothes to the hospital/surgery center.          Signed, Sanda Klein, MD  07/24/2020 8:41 AM    [facility] [place], [place], [place]  [address] Phone: [phone]; Fax: [phone]

## 2020-07-23 NOTE — Patient Instructions (Addendum)
Medication Instructions: Your physician recommends that you continue on your current medications as directed. Please refer to the Current Medication list given to you today.  * If you need a refill on your cardiac medications before your next appointment, please call your pharmacy. *  Labwork: Pre procedure lab work today: BMET & CBC  * Will notify you of abnormal results, otherwise continue current treatment plan.*  Testing/Procedures: Your physician has recommended that you have a pacemaker/defibrillator generator change (battery change). Please follow the instructions below, located under the special instructions section.  Follow-Up: Your physician recommends that you schedule a wound check appointment 10-14 days, after your procedure on 08/06/20, with the device clinic.  Your physician recommends that you schedule a follow up appointment in 91 days, after your procedure on 08/06/20, with Dr. Sallyanne Kuster.  Thank you for choosing [facility]!!      Any Other Special Instructions Will Be Listed Below (If Applicable).    [facility] [place], [place]  [place], [place] [address]  Phone: [phone] Fax: [phone]    Generator Change Procedure Instructions  You are scheduled for a Generator Change (battery change) on  08/06/20  with Dr. Sallyanne Kuster.  1. Please arrive at the [facility], Entrance "A"  at [facility] at  11:30 am on the day of your procedure. (The address is [address]  2. DIET: You may have a light, early breakfast the morning of your procedure. NOTHING TO EAT AFTER 8:00 AM.  3. LABS: You will need to have the coronavirus test completed prior to your procedure. An appointment has been made [facility] on 08/02/20. Please go between the hours of 8am -1 pm.   4. MEDICATIONS: Hold the Eliquis the night before and the morning of the procedure.  5.  Plan for an overnight stay.  Bring your insurance cards  and a list of you medications.  6.  Wash your chest and neck with surgical scrub the evening before and the morning of your procedure.  Rinse well. Please review the surgical scrub instruction sheet given to you.   7. Your chest will need to be shaved prior to this procedure (if needed). We ask that you do this yourself at home 1 to 2 days before or if uncomfortable/unable to do yourself, then it will be performed by the hospital staff the day of.  * Special note:  Every effort is made to have your procedure done on time.  Occasionally there are emergencies that present themselves at the hospital that may cause delays.  Please be patient if a delay does occur.                                                                                                           * If you have any questions after you get home, please call Lattie Haw, RN at [phone].    [place] - Preparing For Surgery  Before surgery, you can play an important role. Because skin is not sterile, your  skin needs to be as free of germs as possible. You can reduce the number of germs on your skin by washing with CHG (chlorahexidine gluconate) Soap before surgery.  CHG is an antiseptic cleaner which kills germs and bonds with the skin to continue killing germs even after washing.   Please do not use if you have an allergy to CHG or antibacterial soaps.  If your skin becomes reddened/irritated stop using the CHG.   Do not shave (including legs and underarms) for at least 48 hours prior to first CHG shower.  It is OK to shave your face.  Please follow these instructions carefully:  1.  Shower the night before surgery and the morning of surgery with CHG.  2.  If you choose to wash your hair, wash your hair first as usual with your normal shampoo.  3.  After you shampoo, rinse your hair and body thoroughly to remove the shampoo.  4.  Use CHG as you would any other liquid soap.  You can apply CHG directly to the skin and wash gently  with a clean washcloth. 5.  Apply the CHG Soap to your body ONLY FROM THE NECK DOWN.  Do not use on open wounds or open sores.  Avoid contact with your eyes, ears, mouth and genitals (private parts).    6.  Wash thoroughly, paying special attention to the area where your surgery will be performed.  7.  Thoroughly rinse your body with warm water from the neck down.   8.  DO NOT shower/wash with your normal soap after using and rinsing off the CHG soap.  9.  Pat yourself dry with a clean towel.   10.  Wear clean pajamas.   11.  Place clean sheets on your bed the night of your first shower and do not sleep with pets.  Day of Surgery: Do not apply any deodorants/lotions.  Please wear clean clothes to the hospital/surgery center.

## 2020-07-23 NOTE — Progress Notes (Signed)
Patient ID: Shane [name], male   DOB: 08-Sep-1945, 74 y.o.   MRN: [id number]     Cardiology Office Note    Date:  07/24/2020   ID:  Linc, Renne [birth year], MRN [id number]  PCP:  Albina Billet, MD  Cardiologist:   Sanda Klein, MD   Chief Complaint  Patient presents with   Pacemaker Problem    History of Present Illness:  Shane [name] is a 74 y.o. male who presents for recurrent persistent atrial fibrillation and pacemaker check.  He has a history of tachycardia-bradycardia syndrome, chronic diastolic heart failure (last echo April 2021 shows mildly depressed LVEF 45-50%), morbid obesity, obstructive sleep apnea on CPAP.  He underwent radiofrequency ablation for atrial fibrillation in August 2018 with a recurrent atrial fibrillation requiring cardioversion in February 2021 and June 2021, then started on amiodarone and underwent successful cardioversion with lasting benefit on April 11, 2020.  He has a Actor pacemaker, not capable of remote downloads, which is interrogated today and shows that it reached elective replacement indicator on November 8.  He has not had any recurrent atrial fibrillation since his cardioversion in August.  Lead parameters are good.  He reports improved compliance with CPAP.  He denies daytime hypersomnolence.  The patient specifically denies any chest pain at rest or with exertion, dyspnea at rest or with usual exertion, orthopnea, paroxysmal nocturnal dyspnea, syncope, palpitations, focal neurological deficits, intermittent claudication, lower extremity edema, unexplained weight gain, cough, hemoptysis or wheezing.  He has not had any falls or bleeding problems.   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   guidant  [facility] [id number])   Sinus node dysfunction  (HCC)    Sleep apnea    CPAP   Wears hearing aid in both ears     Past Surgical History:  Procedure Laterality Date   ATRIAL FIBRILLATION ABLATION N/A 04/03/2017   Procedure: Atrial Fibrillation Ablation;  Surgeon: Constance Haw, MD;  Location: [place] [facility];  Service: Cardiovascular;  Laterality: N/A;   CARDIAC CATHETERIZATION  10/13/1994   No CAD   CARDIAC CATHETERIZATION  05/10/2003   No CAD   CARDIOVERSION N/A 10/21/2016   Procedure: CARDIOVERSION;  Surgeon: Sanda Klein, MD;  Location: [place];  Service: Cardiovascular;  Laterality: N/A;   CARDIOVERSION N/A 05/15/2017   Procedure: CARDIOVERSION;  Surgeon: Fay Records, MD;  Location: [place];  Service: Cardiovascular;  Laterality: N/A;   CARDIOVERSION N/A 10/21/2019   Procedure: CARDIOVERSION;  Surgeon: Sanda Klein, MD;  Location: [place];  Service: Cardiovascular;  Laterality: N/A;   CARDIOVERSION N/A 02/24/2020   Procedure: CARDIOVERSION;  Surgeon: Lelon Perla, MD;  Location: [facility] ENDOS[facility];  Service: Cardiovascular;  Laterality: N/A;   CARDIOVERSION N/A 04/11/2020   Procedure: CARDIOVERSION;  Surgeon: Skeet Latch, MD;  Location: [place];  Service: Cardiovascular;  Laterality: N/A;   CATARACT EXTRACTION W/PHACO Left 05/18/2019   Procedure: CATARACT EXTRACTION PHACO AND INTRAOCULAR LENS PLACEMENT ([place]) LEFT  1:21 11.0% 9.01;  Surgeon: Leandrew Koyanagi, MD;  Location: [place];  Service: Ophthalmology;  Laterality: Left;  sleep apnea   CATARACT EXTRACTION W/PHACO Right 06/29/2019   Procedure: CATARACT EXTRACTION PHACO AND INTRAOCULAR LENS PLACEMENT ([facility]) RIGHT  01:14.2  14.5%  10.92;  Surgeon: Leandrew Koyanagi, MD;  Location: [place];  Service: Ophthalmology;  Laterality: Right;  sleep apnea   CYST EXCISION  03/2019   on back    NM MYOCAR PERF WALL MOTION  05/07/2011   Lexiscan: No ishcemia   PERMANENT PACEMAKER INSERTION  03/12/2010   guidant    ROTATOR CUFF REPAIR  1996   SHOULDER ARTHROSCOPY     US ECHOCARDIOGRAPHY  05/07/2011   mod. LVH,LA mod. dilated,borderline aortic root dilatation    Outpatient Medications Prior to Visit  Medication Sig Dispense Refill   acetaminophen (TYLENOL) 650 MG CR tablet Take 1,300 mg by mouth every 8 (eight) hours as needed for pain.     allopurinol (ZYLOPRIM) 300 MG tablet Take 1 tablet (300 mg total) by mouth daily. 30 tablet 2   amiodarone (PACERONE) 200 MG tablet Take 1 tablet (200 mg total) by mouth daily. 30 tablet 4   anastrozole (ARIMIDEX) 1 MG tablet Take 1 mg by mouth daily.      apixaban (ELIQUIS) 5 MG TABS tablet Take 1 tablet (5 mg total) by mouth 2 (two) times daily. 60 tablet 4   ARTIFICIAL TEAR SOLUTION OP Place 1 drop into both eyes daily as needed (dry eyes).     Ascorbic Acid (VITAMIN C) 1000 MG tablet Take 1,000 mg by mouth daily.     Cholecalciferol (VITAMIN D-3) 1000 UNITS CAPS Take 1,000 Units by mouth daily.      citalopram (CELEXA) 40 MG tablet Take 1 tablet (40 mg total) by mouth daily. MUST RECEIVE FUTURE REFILLS FROM PCP. (Patient taking differently: Take 40 mg by mouth daily. ) 30 tablet 0   co-enzyme Q-10 50 MG capsule Take 50 mg by mouth daily.      colchicine 0.6 MG tablet Take 0.5 tablets (0.3 mg total) by mouth daily as needed. (Patient taking differently: Take 0.3 mg by mouth daily as needed (gout). ) 30 tablet 2   diltiazem (CARDIZEM CD) 120 MG 24 hr capsule Take 1 capsule (120 mg total) by mouth daily. 90 capsule 1   furosemide (LASIX) 40 MG tablet Take 1.5 tablets (60 mg total) by mouth daily. 45 tablet 4   Multiple Vitamin (MULTIVITAMIN WITH MINERALS) TABS tablet Take 1 tablet by mouth daily.     Omega-3 Fatty Acids (FISH OIL) 1200 MG CAPS Take 1,200 mg by mouth 2 (two) times daily.      OVER THE COUNTER MEDICATION Take 100 mg by mouth daily. CBD oil     Phenylephrine-APAP-guaiFENesin (MUCINEX SINUS-MAX CONG & PAIN PO) Take 2 tablets by mouth  daily as needed (sinus headaches).      Phenylephrine-APAP-guaiFENesin (MUCINEX SINUS-MAX PO) Take 2 tablets by mouth at bedtime as needed (congestion).     potassium chloride SA (KLOR-CON) 20 MEQ tablet Take 1 tablet (20 mEq total) by mouth daily. 90 tablet 3   psyllium (HYDROCIL/METAMUCIL) 95 % PACK Take 1 packet by mouth at bedtime.     pyridOXINE (VITAMIN B-6) 100 MG tablet Take 100 mg by mouth daily.     rosuvastatin (CRESTOR) 5 MG tablet Take 5 mg by mouth daily.     sodium chloride (OCEAN) 0.65 % SOLN nasal spray Place 1 spray into both nostrils as needed for congestion.     tamsulosin (FLOMAX) 0.4 MG CAPS capsule Take 1 capsule (0.4 mg total) by mouth daily. 30 capsule 4   Testosterone 1.62 % GEL Apply 3 Pump topically daily. Shoulders  1   vitamin B-12 (CYANOCOBALAMIN) 1000 MCG tablet Take 1,000 mcg by mouth daily.  zinc gluconate 50 MG tablet Take 50 mg by mouth daily.     No facility-administered medications prior to visit.     Allergies:   Patient has no known allergies.   Social History   Socioeconomic History   Marital status: Married    Spouse name: Not on file   Number of children: Not on file   Years of education: Not on file   Highest education level: Not on file  Occupational History   Not on file  Tobacco Use   Smoking status: Former Smoker    Types: Cigars    Quit date: 05/11/1993    Years since quitting: 27.2   Smokeless tobacco: Former Systems developer    Types: [place] date: 05/11/1993  Vaping Use   Vaping Use: Never used  Substance and Sexual Activity   Alcohol use: Not Currently    Comment: social   Drug use: No   Sexual activity: Not on file  Other Topics Concern   Not on file  Social History Narrative   Not on file   Social Determinants of Health   Financial Resource Strain:    Difficulty of Paying Living Expenses: Not on file  Food Insecurity:    Worried About Charity fundraiser in the Last Year: Not on file   [place] in the Last Year: Not on file  Transportation Needs:    Lack of Transportation (Medical): Not on file   Lack of Transportation (Non-Medical): Not on file  Physical Activity:    Days of Exercise per Week: Not on file   Minutes of Exercise per Session: Not on file  Stress:    Feeling of Stress : Not on file  Social Connections:    Frequency of Communication with Friends and Family: Not on file   Frequency of Social Gatherings with Friends and Family: Not on file   Attends Religious Services: Not on file   Active Member of Clubs or Organizations: Not on file   Attends Archivist Meetings: Not on file   Marital Status: Not on file     Family History:  The patient's family history includes Alzheimer's disease in his father; Dementia in his sister; Heart disease in his sister; Heart failure in his mother; Stroke in his mother.   ROS:   Please see the history of present illness.    All other systems are reviewed and are negative.   PHYSICAL EXAM:   VS:  BP 136/64    Pulse 60    Ht 5\' 11"  (1.803 m)    Wt 258 lb 6.4 oz (117.2 kg)    SpO2 97%    BMI 36.04 kg/m      General: Alert, oriented x3, no distress, severely obese.  Healthy left subclavian pacemaker site. Head: no evidence of trauma, PERRL, EOMI, no exophtalmos or lid lag, no myxedema, no xanthelasma; normal ears, nose and oropharynx Neck: normal jugular venous pulsations and no hepatojugular reflux; brisk carotid pulses without delay and no carotid bruits Chest: clear to auscultation, no signs of consolidation by percussion or palpation, normal fremitus, symmetrical and full respiratory excursions Cardiovascular: normal position and quality of the apical impulse, regular rhythm, normal first and paradoxically split second heart sounds, no murmurs, rubs or gallops Abdomen: no tenderness or distention, no masses by palpation, no abnormal pulsatility or arterial bruits, normal bowel sounds, no  hepatosplenomegaly Extremities: no clubbing, cyanosis or edema; 2+ radial, ulnar and brachial pulses bilaterally;  2+ right femoral, posterior tibial and dorsalis pedis pulses; 2+ left femoral, posterior tibial and dorsalis pedis pulses; no subclavian or femoral bruits Neurological: grossly nonfocal Psych: Normal mood and affect    Wt Readings from Last 3 Encounters:  07/23/20 258 lb 6.4 oz (117.2 kg)  04/23/20 261 lb 12.8 oz (118.8 kg)  03/28/20 (!) 265 lb 6.4 oz (120.4 kg)      Studies/Labs Reviewed:   EKG:  EKG is ordered today and shows atrial paced, ventricular paced rhythm with a very broad QRS of 220 ms, QTC 540 ms  ASSESSMENT:    1. SSS (sick sinus syndrome) (HCC)   2. Persistent atrial fibrillation ([place])   3. Essential hypertension   4. Class 2 severe obesity due to excess calories with serious comorbidity and body mass index (BMI) of 36.0 to 36.9 in adult ([place])   5. Pacemaker battery depletion   6. Chronic diastolic heart failure ([place])   7. OSA (obstructive sleep apnea)   8. Pre-op testing      PLAN:  In order of problems listed above:  1.  AFib s/p RFA 04/03/2017: Now requiring amiodarone for maintenance of sinus rhythm, so far with good results for the last 3 months and without any evidence of toxicity.  Compliant with anticoagulation with Eliquis.  CHA2DS2-VASc 4 (age 37, CHF, hypertension).   No history of embolic events. 2. SSS: Virtually 100% atrial paced when not in atrial fibrillation.  Heart rate histogram distribution has appeared appropriate with current sensor settings. 3. HTN: Blood pressure control is fair, systolic slightly higher than desirable today.  Had planned to stop his diltiazem, but will continue for the time being until we determined that he does not need it for his hypertension. 4. Obesity: We have discussed the relationship between weight and atrial fibrillation burden several times.  He has lost about 7 pounds since July. 5. PM: Device reached  ERI a couple of weeks ago.  Currently has 100% atrial paced, ventricular paced rhythm, but when he was in atrial flutter few months ago he had AV conduction at about 120 bpm.  The degree of AV delay has increased since starting amiodarone, but is not pacemaker dependent.  When not in atrial arrhythmia has virtually 100% atrial paced rhythm.  Have scheduled for generator change out on December 6.  Discussed the proedure and possible complications in detail. This procedure has been fully reviewed with the patient and written informed consent has been obtained. 6. CHF: Good functional status, no hypervolemia on physical exam.  His echocardiogram shows slight reduction in LVEF to 45-50%, and it would be highly desirable to avoid right ventricular pacing.  With the current device settings we have been unable to avoid ventricular pacing, especially after starting amiodarone.  Hopefully with the expanded AV delay settings on the new device we may be able to reduce the frequency of ventricular pacing. 7. OSA: He reports 100% compliance with CPAP and he denies daytime hypersomnolence.  We were unable to estimate PA pressure on his last echo.  Have not yet been able to get him back in the sleep clinic with Dr. Claiborne Billings.     Medication Adjustments/Labs and Tests Ordered: Current medicines are reviewed at length with the patient today.  Concerns regarding medicines are outlined above.  Medication changes, Labs and Tests ordered today are listed in the Patient Instructions below. Patient Instructions  Medication Instructions: Your physician recommends that you continue on your current medications as directed. Please refer to the  Current Medication list given to you today.  * If you need a refill on your cardiac medications before your next appointment, please call your pharmacy. *  Labwork: Pre procedure lab work today: BMET & CBC  * Will notify you of abnormal results, otherwise continue current treatment  plan.*  Testing/Procedures: Your physician has recommended that you have a pacemaker/defibrillator generator change (battery change). Please follow the instructions below, located under the special instructions section.  Follow-Up: Your physician recommends that you schedule a wound check appointment 10-14 days, after your procedure on 08/06/20, with the device clinic.  Your physician recommends that you schedule a follow up appointment in 91 days, after your procedure on 08/06/20, with Dr. Sallyanne Kuster.  Thank you for choosing [facility]!!      Any Other Special Instructions Will Be Listed Below (If Applicable).    [facility] [place] [place], [address]  [place], [place] [address]  Phone: [phone] Fax: [phone]    Generator Change Procedure Instructions  You are scheduled for a Generator Change (battery change) on  08/06/20  with Dr. Sallyanne Kuster.  1. Please arrive at the [facility], Entrance "A"  at [facility] at  11:30 am on the day of your procedure. (The address is [address]  2. DIET: You may have a light, early breakfast the morning of your procedure. NOTHING TO EAT AFTER 8:00 AM.  3. LABS: You will need to have the coronavirus test completed prior to your procedure. An appointment has been made [facility] on 08/02/20. Please go between the hours of 8am -1 pm.   4. MEDICATIONS: Hold the Eliquis the night before and the morning of the procedure.  5.  Plan for an overnight stay.  Bring your insurance cards and a list of you medications.  6.  Wash your chest and neck with surgical scrub the evening before and the morning of your procedure.  Rinse well. Please review the surgical scrub instruction sheet given to you.   7. Your chest will need to be shaved prior to this procedure (if needed). We ask that you do this yourself at home 1 to 2 days before or if uncomfortable/unable to do yourself, then it will be performed  by the hospital staff the day of.  * Special note:  Every effort is made to have your procedure done on time.  Occasionally there are emergencies that present themselves at the hospital that may cause delays.  Please be patient if a delay does occur.                                                                                                           * If you have any questions after you get home, please call Lattie Haw, RN at [phone].    [place] - Preparing For Surgery  Before surgery, you can play an important role. Because skin is not sterile, your skin needs to be as free of germs as possible. You can reduce the number of germs  on your skin by washing with CHG (chlorahexidine gluconate) Soap before surgery.  CHG is an antiseptic cleaner which kills germs and bonds with the skin to continue killing germs even after washing.   Please do not use if you have an allergy to CHG or antibacterial soaps.  If your skin becomes reddened/irritated stop using the CHG.   Do not shave (including legs and underarms) for at least 48 hours prior to first CHG shower.  It is OK to shave your face.  Please follow these instructions carefully:  1.  Shower the night before surgery and the morning of surgery with CHG.  2.  If you choose to wash your hair, wash your hair first as usual with your normal shampoo.  3.  After you shampoo, rinse your hair and body thoroughly to remove the shampoo.  4.  Use CHG as you would any other liquid soap.  You can apply CHG directly to the skin and wash gently with a clean washcloth. 5.  Apply the CHG Soap to your body ONLY FROM THE NECK DOWN.  Do not use on open wounds or open sores.  Avoid contact with your eyes, ears, mouth and genitals (private parts).    6.  Wash thoroughly, paying special attention to the area where your surgery will be performed.  7.  Thoroughly rinse your body with warm water from the neck down.   8.  DO NOT shower/wash with your normal soap  after using and rinsing off the CHG soap.  9.  Pat yourself dry with a clean towel.   10.  Wear clean pajamas.   11.  Place clean sheets on your bed the night of your first shower and do not sleep with pets.  Day of Surgery: Do not apply any deodorants/lotions.  Please wear clean clothes to the hospital/surgery center.          Signed, Sanda Klein, MD  07/24/2020 8:41 AM    [facility] [place], [place], [place]  [address] Phone: [phone]; Fax: [phone]

## 2020-07-24 ENCOUNTER — Encounter: Payer: Self-pay | Admitting: Cardiovascular Disease

## 2020-08-02 ENCOUNTER — Other Ambulatory Visit
Admission: RE | Admit: 2020-08-02 | Discharge: 2020-08-02 | Disposition: A | Discharge status: Home / Self Care | Payer: BC Managed Care – PPO | Source: Ambulatory Visit | Attending: Cardiovascular Disease | Admitting: Cardiovascular Disease

## 2020-08-02 ENCOUNTER — Other Ambulatory Visit: Payer: Self-pay

## 2020-08-02 ENCOUNTER — Other Ambulatory Visit (HOSPITAL_COMMUNITY): Payer: BC Managed Care – PPO

## 2020-08-02 DIAGNOSIS — Z01812 Encounter for preprocedural laboratory examination: Secondary | ICD-10-CM | POA: Insufficient documentation

## 2020-08-02 DIAGNOSIS — Z20822 Contact with and (suspected) exposure to covid-19: Secondary | ICD-10-CM | POA: Insufficient documentation

## 2020-08-02 LAB — SARS CORONAVIRUS 2 (TAT 6-24 HRS): SARS Coronavirus 2: NEGATIVE

## 2020-08-03 ENCOUNTER — Telehealth: Payer: Self-pay | Admitting: Cardiovascular Disease

## 2020-08-03 NOTE — Telephone Encounter (Signed)
That's fine. One (or both) of them may have to wait in the car, not sure what the rules are right now.

## 2020-08-03 NOTE — Telephone Encounter (Signed)
Spoke with patient and informed patient of Dr. Victorino December response.

## 2020-08-03 NOTE — Telephone Encounter (Signed)
Patient states his wife and their daughter will be coming with him to his procedure Monday. He states his wife does not drive so they need their daughter to come as well.

## 2020-08-04 ENCOUNTER — Other Ambulatory Visit: Payer: Self-pay | Admitting: *Deleted

## 2020-08-04 DIAGNOSIS — I495 Sick sinus syndrome: Secondary | ICD-10-CM

## 2020-08-05 LAB — SARS CORONAVIRUS 2 (TAT 6-24 HRS): SARS Coronavirus 2: NEGATIVE

## 2020-08-06 ENCOUNTER — Other Ambulatory Visit: Payer: Self-pay

## 2020-08-06 ENCOUNTER — Ambulatory Visit (HOSPITAL_COMMUNITY)
Admission: RE | Admit: 2020-08-06 | Discharge: 2020-08-06 | Disposition: A | Discharge status: Home / Self Care | Payer: BC Managed Care – PPO | Attending: Cardiovascular Disease | Admitting: Cardiovascular Disease

## 2020-08-06 ENCOUNTER — Encounter (HOSPITAL_COMMUNITY)
Admission: RE | Disposition: A | Discharge status: Home / Self Care | Payer: Self-pay | Source: Home / Self Care | Attending: Cardiovascular Disease

## 2020-08-06 DIAGNOSIS — I5032 Chronic diastolic (congestive) heart failure: Secondary | ICD-10-CM | POA: Insufficient documentation

## 2020-08-06 DIAGNOSIS — Z87891 Personal history of nicotine dependence: Secondary | ICD-10-CM | POA: Diagnosis not present

## 2020-08-06 DIAGNOSIS — Z8249 Family history of ischemic heart disease and other diseases of the circulatory system: Secondary | ICD-10-CM | POA: Diagnosis not present

## 2020-08-06 DIAGNOSIS — G4733 Obstructive sleep apnea (adult) (pediatric): Secondary | ICD-10-CM | POA: Insufficient documentation

## 2020-08-06 DIAGNOSIS — Z45010 Encounter for checking and testing of cardiac pacemaker pulse generator [battery]: Secondary | ICD-10-CM | POA: Insufficient documentation

## 2020-08-06 DIAGNOSIS — Z79899 Other long term (current) drug therapy: Secondary | ICD-10-CM | POA: Diagnosis not present

## 2020-08-06 DIAGNOSIS — I110 Hypertensive heart disease with heart failure: Secondary | ICD-10-CM | POA: Diagnosis not present

## 2020-08-06 DIAGNOSIS — Z6836 Body mass index (BMI) 36.0-36.9, adult: Secondary | ICD-10-CM | POA: Insufficient documentation

## 2020-08-06 DIAGNOSIS — I4819 Other persistent atrial fibrillation: Secondary | ICD-10-CM | POA: Diagnosis not present

## 2020-08-06 DIAGNOSIS — Z7901 Long term (current) use of anticoagulants: Secondary | ICD-10-CM | POA: Insufficient documentation

## 2020-08-06 DIAGNOSIS — I495 Sick sinus syndrome: Secondary | ICD-10-CM | POA: Diagnosis not present

## 2020-08-06 HISTORY — PX: PPM GENERATOR CHANGEOUT: EP1233

## 2020-08-06 SURGERY — PPM GENERATOR CHANGEOUT

## 2020-08-06 MED ORDER — CEFAZOLIN SODIUM-DEXTROSE 2-4 GM/100ML-% IV SOLN
2.0000 g | INTRAVENOUS | Status: AC
  Administered 2020-08-06: 2 g via INTRAVENOUS

## 2020-08-06 MED ORDER — SODIUM CHLORIDE 0.9 % IV SOLN: INTRAVENOUS | Status: DC

## 2020-08-06 MED ORDER — LIDOCAINE HCL (PF) 1 % IJ SOLN
INTRAMUSCULAR | Status: AC
  Filled 2020-08-06: qty 60

## 2020-08-06 MED ORDER — CHLORHEXIDINE GLUCONATE 4 % EX LIQD: 4.0000 "application " | Freq: Once | CUTANEOUS | Status: DC

## 2020-08-06 MED ORDER — LIDOCAINE HCL (PF) 1 % IJ SOLN
INTRAMUSCULAR | Status: DC | PRN
  Administered 2020-08-06: 50 mL

## 2020-08-06 MED ORDER — CEFAZOLIN SODIUM-DEXTROSE 2-4 GM/100ML-% IV SOLN
INTRAVENOUS | Status: AC
  Filled 2020-08-06: qty 100

## 2020-08-06 MED ORDER — SODIUM CHLORIDE 0.9 % IV SOLN
INTRAVENOUS | Status: AC
  Filled 2020-08-06: qty 2

## 2020-08-06 MED ORDER — SODIUM CHLORIDE 0.9 % IV SOLN
80.0000 mg | INTRAVENOUS | Status: AC
  Administered 2020-08-06: 80 mg

## 2020-08-06 SURGICAL SUPPLY — 4 items
CABLE SURGICAL S-101-97-12 (CABLE) ×3 IMPLANT
PACEMAKER ACCOLADE DR-EL (Pacemaker) ×3 IMPLANT
PAD PRO RADIOLUCENT 2001M-C (PAD) ×3 IMPLANT
TRAY PACEMAKER INSERTION (PACKS) ×3 IMPLANT

## 2020-08-06 NOTE — Op Note (Signed)
Procedure report  Procedure performed:  1. Dual chamber pacemaker generator changeout   Reason for procedure:  1. Device generator at elective replacement interval  2. Tachycardia-bradycardia sd. Procedure performed by:  Sanda Klein, MD  Complications:  None  Estimated blood loss:  <5 mL  Medications administered during procedure:  Ancef 2 g intravenously,  lidocaine 1% 30 mL locally Device details:   New Generator Guidant Accolade MRI EL DR model number [id number], serial number [id number] Right atrial lead (chronic) Guidant , model number X621266, serial [id number] (implanted 06/26/2003) Right ventricular lead (chronic)  Guidant, model number 0865, serial number [id number] (implanted 06/26/2003)  Explanted generator [employer],  model number S404, serial number  [id number] (implanted 03/12/2010)  Procedure details:  After the risks and benefits of the procedure were discussed the patient provided informed consent. She was brought to the cardiac catheter lab in the fasting state. The patient was prepped and draped in usual sterile fashion. Local anesthesia with 1% lidocaine was administered to to the left infraclavicular area. A 5-6cm horizontal incision was made parallel with and 2-3 cm caudal to the left clavicle, in the area of an old scar.  Using minimal electrocautery and mostly sharp and blunt dissection the prepectoral pocket was opened carefully to avoid injury to the loops of chronic leads. Extensive dissection was not necessary. The device was explanted. The pocket was carefully inspected for hemostasis and flushed with copious amounts of antibiotic solution.  The leads were disconnected from the old generator and the new generator was connected to the chronic leads, with appropriate pacing noted. Testing of the lead parameters via telemetry showed excellent values.   The entire system was then carefully inserted in the pocket with care been taking that the leads and device assumed a  comfortable position without pressure on the incision. Great care was taken that the leads be located deep to the generator. The pocket was then closed in layers using 2 layers of 2-0 Vicryl, one layer of 3-0 Vicryl and cutaneous steristrips after which a sterile dressing was applied.   At the end of the procedure the following lead parameters were encountered:   Right atrial lead sensed P waves none detected,  impedance 423 ohms, threshold 0.9 at 0.4 ms pulse width.  Right ventricular lead sensed R waves  8.1 mV, impedance 467 ohms, threshold 0.6 at 0.4 ms pulse width.   Sanda Klein, MD, [facility] [phone] office [phone] pager

## 2020-08-06 NOTE — Discharge Instructions (Signed)

## 2020-08-06 NOTE — Interval H&P Note (Signed)
History and Physical Interval Note:  08/06/2020 1:38 PM  Shane [name]  has presented today for surgery, with the diagnosis of eri.  The various methods of treatment have been discussed with the patient and family. After consideration of risks, benefits and other options for treatment, the patient has consented to  Procedure(s): PPM GENERATOR CHANGEOUT (N/A) as a surgical intervention.  The patient's history has been reviewed, patient examined, no change in status, stable for surgery.  I have reviewed the patient's chart and labs.  Questions were answered to the patient's satisfaction.     [name]

## 2020-08-07 ENCOUNTER — Encounter (HOSPITAL_COMMUNITY): Payer: Self-pay | Admitting: Cardiovascular Disease

## 2020-08-08 ENCOUNTER — Encounter (HOSPITAL_COMMUNITY): Payer: Self-pay | Admitting: Cardiovascular Disease

## 2020-08-16 ENCOUNTER — Ambulatory Visit (INDEPENDENT_AMBULATORY_CARE_PROVIDER_SITE_OTHER): Payer: BC Managed Care – PPO | Admitting: Emergency Medicine

## 2020-08-16 ENCOUNTER — Other Ambulatory Visit: Payer: Self-pay

## 2020-08-16 DIAGNOSIS — Z950 Presence of cardiac pacemaker: Secondary | ICD-10-CM

## 2020-08-16 DIAGNOSIS — I495 Sick sinus syndrome: Secondary | ICD-10-CM

## 2020-08-16 LAB — CUP PACEART INCLINIC DEVICE CHECK
Brady Statistic RA Percent Paced: 100 %
Brady Statistic RV Percent Paced: 7 %
Date Time Interrogation Session: 20211216000000
Implantable Lead Implant Date: 20041025
Implantable Lead Implant Date: 20041025
Implantable Lead Location: 753859
Implantable Lead Location: 753860
Implantable Lead Model: 4457
Implantable Lead Model: 4480
Implantable Lead Serial Number: 338209
Implantable Lead Serial Number: 424134
Implantable Pulse Generator Implant Date: 20211206
Lead Channel Impedance Value: 501 Ohm
Lead Channel Impedance Value: 523 Ohm
Lead Channel Pacing Threshold Amplitude: 0.6 V
Lead Channel Pacing Threshold Amplitude: 0.9 V
Lead Channel Pacing Threshold Pulse Width: 0.4 ms
Lead Channel Pacing Threshold Pulse Width: 0.4 ms
Lead Channel Setting Pacing Amplitude: 2.5 V
Lead Channel Setting Pacing Amplitude: 2.5 V
Lead Channel Setting Pacing Pulse Width: 0.4 ms
Lead Channel Setting Sensing Sensitivity: 3 mV
Pulse Gen Serial Number: 955727

## 2020-08-16 NOTE — Progress Notes (Signed)
Wound check appointment. Steri-strips removed. Wound without redness or edema. Incision edges approximated, wound well healed. Normal device function. Thresholds, sensing, and impedances consistent with implant measurements. Device programmed at chronic settings due to mature leads. Histogram distribution appropriate for patient and level of activity.4 AMS episodes that appear to AT related to farfield r-waves. No high ventricular rates noted. Patient educated about wound care, arm mobility. Follow-up with Dr Carson Myrtle on 11/13/20. Patient will be enrolled in remote follow-up and next remote is scheduled for 11/05/20.

## 2020-09-03 ENCOUNTER — Other Ambulatory Visit: Payer: Self-pay

## 2020-09-03 ENCOUNTER — Encounter: Payer: Self-pay | Admitting: Podiatry

## 2020-09-03 ENCOUNTER — Ambulatory Visit: Payer: BC Managed Care – PPO | Admitting: Podiatry

## 2020-09-03 DIAGNOSIS — Q828 Other specified congenital malformations of skin: Secondary | ICD-10-CM | POA: Diagnosis not present

## 2020-09-03 NOTE — Progress Notes (Signed)
Subjective:  Patient ID: Shane [name], male    DOB: [birth year],  MRN: [id number] HPI Chief Complaint  Patient presents with  . Foot Pain    Lateral foot right - callused area x 2-3 months, had pedicure and they trimmed it some last week.  . New Patient (Initial Visit)    75 y.o. male presents with the above complaint.   ROS: Denies fever chills nausea vomiting muscle aches pains calf pain back pain chest pain shortness of breath.  Past Medical History:  Diagnosis Date  . Atrioventricular block   . Degenerative joint disease   . Gout   . Paroxysmal atrial fibrillation (HCC)   . Presence of permanent cardiac pacemaker 03/12/10   guidant  ([place])  . Sinus node dysfunction (HCC)   . Sleep apnea    CPAP  . 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 ATRIAL FIBRILLATION ABLATION N/A 04/03/2017   Procedure: Atrial Fibrillation Ablation;  Surgeon: Constance Haw, MD;  Location: [place] [facility];  Service: Cardiovascular;  Laterality: N/A;  . CARDIAC CATHETERIZATION  10/13/1994   No CAD  . CARDIAC CATHETERIZATION  05/10/2003   No CAD  . CARDIOVERSION N/A 10/21/2016   Procedure: CARDIOVERSION;  Surgeon: Sanda Klein, MD;  Location: [place];  Service: Cardiovascular;  Laterality: N/A;  . CARDIOVERSION N/A 05/15/2017   Procedure: CARDIOVERSION;  Surgeon: Fay Records, MD;  Location: [place];  Service: Cardiovascular;  Laterality: N/A;  . CARDIOVERSION N/A 10/21/2019   Procedure: CARDIOVERSION;  Surgeon: Sanda Klein, MD;  Location: [facility];  Service: Cardiovascular;  Laterality: N/A;  . CARDIOVERSION N/A 02/24/2020   Procedure: CARDIOVERSION;  Surgeon: Lelon Perla, MD;  Location: [facility];  Service: Cardiovascular;  Laterality: N/A;  . CARDIOVERSION N/A 04/11/2020   Procedure: CARDIOVERSION;  Surgeon: Skeet Latch, MD;  Location: [place];  Service: Cardiovascular;  Laterality: N/A;   . CATARACT EXTRACTION W/PHACO Left 05/18/2019   Procedure: CATARACT EXTRACTION PHACO AND INTRAOCULAR LENS PLACEMENT ([place]) LEFT  1:21 11.0% 9.01;  Surgeon: Leandrew Koyanagi, MD;  Location: [place];  Service: Ophthalmology;  Laterality: Left;  sleep apnea  . CATARACT EXTRACTION W/PHACO Right 06/29/2019   Procedure: CATARACT EXTRACTION PHACO AND INTRAOCULAR LENS PLACEMENT ([facility]) RIGHT  01:14.2  14.5%  10.92;  Surgeon: Leandrew Koyanagi, MD;  Location: [place];  Service: Ophthalmology;  Laterality: Right;  sleep apnea  . CYST EXCISION  03/2019   on back   . NM MYOCAR PERF WALL MOTION  05/07/2011   Lexiscan: No ishcemia  . PERMANENT PACEMAKER INSERTION  03/12/2010   guidant  . PPM GENERATOR CHANGEOUT N/A 08/06/2020   Procedure: PPM GENERATOR CHANGEOUT;  Surgeon: Sanda Klein, MD;  Location: [place] [facility];  Service: Cardiovascular;  Laterality: N/A;  . ROTATOR CUFF REPAIR  1996  . SHOULDER ARTHROSCOPY    . US ECHOCARDIOGRAPHY  05/07/2011   mod. LVH,LA mod. dilated,borderline aortic root dilatation    Current Outpatient Medications:  .  acetaminophen (TYLENOL) 650 MG CR tablet, Take 1,300 mg by mouth every 8 (eight) hours as needed for pain., Disp: , Rfl:  .  allopurinol (ZYLOPRIM) 300 MG tablet, Take 1 tablet (300 mg total) by mouth daily., Disp: 30 tablet, Rfl: 2 .  amiodarone (PACERONE) 200 MG tablet, Take 1 tablet (200 mg total) by mouth daily., Disp: 30 tablet, Rfl: 4 .  anastrozole (ARIMIDEX) 1 MG tablet, Take 1 mg by  mouth at bedtime. , Disp: , Rfl:  .  apixaban (ELIQUIS) 5 MG TABS tablet, Take 1 tablet (5 mg total) by mouth 2 (two) times daily., Disp: 60 tablet, Rfl: 4 .  ARTIFICIAL TEAR SOLUTION OP, Place 1 drop into both eyes daily as needed (dry eyes)., Disp: , Rfl:  .  Ascorbic Acid (VITAMIN C) 1000 MG tablet, Take 1,000 mg by mouth daily., Disp: , Rfl:  .  Cholecalciferol (VITAMIN D-3) 1000 UNITS CAPS, Take 1,000 Units by mouth at bedtime. , Disp: ,  Rfl:  .  citalopram (CELEXA) 40 MG tablet, Take 1 tablet (40 mg total) by mouth daily. MUST RECEIVE FUTURE REFILLS FROM PCP. (Patient taking differently: Take 40 mg by mouth at bedtime.), Disp: 30 tablet, Rfl: 0 .  Co-Enzyme Q-10 100 MG CAPS, Take 400 mg by mouth daily. , Disp: , Rfl:  .  colchicine 0.6 MG tablet, Take 0.5 tablets (0.3 mg total) by mouth daily as needed. (Patient taking differently: Take 0.3 mg by mouth daily as needed (gout).), Disp: 30 tablet, Rfl: 2 .  diltiazem (CARDIZEM CD) 120 MG 24 hr capsule, Take 1 capsule (120 mg total) by mouth daily., Disp: 90 capsule, Rfl: 1 .  furosemide (LASIX) 40 MG tablet, Take 1.5 tablets (60 mg total) by mouth daily. (Patient taking differently: Take 40 mg by mouth daily.), Disp: 45 tablet, Rfl: 4 .  Multiple Vitamin (MULTIVITAMIN WITH MINERALS) TABS tablet, Take 1 tablet by mouth daily., Disp: , Rfl:  .  Omega-3 Fatty Acids (FISH OIL) 1200 MG CAPS, Take 1,200 mg by mouth 2 (two) times daily. , Disp: , Rfl:  .  OVER THE COUNTER MEDICATION, Take 100 mg by mouth daily. CBD oil, Disp: , Rfl:  .  Phenylephrine-APAP-guaiFENesin (MUCINEX SINUS-Nivin Braniff CONG & PAIN PO), Take 2 tablets by mouth daily as needed (sinus headaches). , Disp: , Rfl:  .  potassium chloride SA (KLOR-CON) 20 MEQ tablet, Take 1 tablet (20 mEq total) by mouth daily., Disp: 90 tablet, Rfl: 3 .  psyllium (HYDROCIL/METAMUCIL) 95 % PACK, Take 1 packet by mouth at bedtime., Disp: , Rfl:  .  pyridOXINE (VITAMIN B-6) 100 MG tablet, Take 100 mg by mouth daily., Disp: , Rfl:  .  rosuvastatin (CRESTOR) 5 MG tablet, Take 5 mg by mouth daily., Disp: , Rfl:  .  sodium chloride (OCEAN) 0.65 % SOLN nasal spray, Place 1 spray into both nostrils as needed for congestion., Disp: , Rfl:  .  tamsulosin (FLOMAX) 0.4 MG CAPS capsule, Take 1 capsule (0.4 mg total) by mouth daily. (Patient taking differently: Take 0.4 mg by mouth at bedtime.), Disp: 30 capsule, Rfl: 4 .  Testosterone 1.62 % GEL, Apply 3 Pump  topically daily. Shoulders, Disp: , Rfl: 1 .  vitamin B-12 (CYANOCOBALAMIN) 1000 MCG tablet, Take 1,000 mcg by mouth daily., Disp: , Rfl:  .  zinc gluconate 50 MG tablet, Take 50 mg by mouth daily., Disp: , Rfl:   No Known Allergies Review of Systems Objective:  There were no vitals filed for this visit.  General: Well developed, nourished, in no acute distress, alert and oriented x3   Dermatological: Skin is warm, dry and supple bilateral. Nails x 10 are well maintained; remaining integument appears unremarkable at this time. There are no open sores, no preulcerative lesions, no rash or signs of infection present.  Benign skin lesion porokeratosis fifth met base right noncomplicated.  Vascular: Dorsalis Pedis artery and Posterior Tibial artery pedal pulses are 2/4 bilateral with immedate  capillary fill time. Pedal hair growth present. No varicosities and no lower extremity edema present bilateral.   Neruologic: Grossly intact via light touch bilateral. Vibratory intact via tuning fork bilateral. Protective threshold with Semmes Wienstein monofilament intact to all pedal sites bilateral. Patellar and Achilles deep tendon reflexes 2+ bilateral. No Babinski or clonus noted bilateral.   Musculoskeletal: No gross boney pedal deformities bilateral. No pain, crepitus, or limitation noted with foot and ankle range of motion bilateral. Muscular strength 5/5 in all groups tested bilateral.  Gait: Unassisted, Nonantalgic.    Radiographs:  None taken  Assessment & Plan:   Assessment: Poor keratoma plantar lateral aspect left fifth met base right.  Noncomplicated.  Benign skin lesion.  Plan: Debridement of reactive hyperkeratotic lesion benign skin lesion.     Shane [name], [place]

## 2020-09-20 ENCOUNTER — Telehealth: Payer: Self-pay | Admitting: Cardiovascular Disease

## 2020-09-20 NOTE — Telephone Encounter (Signed)
  1. Has your device fired? no  2. Is you device beeping? no  3. Are you experiencing draining or swelling at device site? Leaking at the sight.  4. Are you calling to see if we received your device transmission? no  5. Have you passed out? No   Patient states he has been having leaking at his incision site.    Please route to [place]

## 2020-09-20 NOTE — Telephone Encounter (Signed)
Returning patients phone call.   Patient reports of a hole in his incision where his device was placed (08/06/20) has been leaking a red fluid x3 days. Reports of redness/warmth around site. Denies any fever or chills. Apt. Made 09/21/20 @ 11:00 AM at [address] [employer]. Patient given date, time and location. Advised to call with further questions or concerns.

## 2020-09-21 ENCOUNTER — Other Ambulatory Visit: Payer: Self-pay

## 2020-09-21 ENCOUNTER — Ambulatory Visit (INDEPENDENT_AMBULATORY_CARE_PROVIDER_SITE_OTHER): Payer: BC Managed Care – PPO | Admitting: Emergency Medicine

## 2020-09-21 DIAGNOSIS — I495 Sick sinus syndrome: Secondary | ICD-10-CM

## 2020-09-21 MED ORDER — CEPHALEXIN 500 MG PO CAPS: 500.0000 mg | ORAL_CAPSULE | Freq: Two times a day (BID) | ORAL | 0 refills | Status: AC

## 2020-09-21 NOTE — Progress Notes (Signed)
Patient reports he started to have red drainage from incision site x 4 days ago that left a quarter size stain on his t-shirt. No drainage since yesterday. Incision site pink , normothermic, small area with scab present, no edema, no drainage from site and unable to express any drainage with palpation . Dr Rayann Heman in to assess incision site, forceps used by Dr Rayann Heman to assess are with scab . Stitch present under crust and removed by Dr Rayann Heman. Patient placed on Keflex 500 mg BID x 7 days and to follow-up with Dr Sallyanne Kuster next week.

## 2020-09-21 NOTE — Patient Instructions (Signed)
Start Keflex 500 mg twice a day for the next 7 days. Dr Croitoru's office will contact you for a follow-up appointment next week. Call the office if you develop any swelling, drainage, the wound becomes hot to touch, or a fever or chills. If you develop a fever or chills  after office hours be seen in an urgent care center or emergency room.

## 2020-10-01 ENCOUNTER — Other Ambulatory Visit: Payer: Self-pay

## 2020-10-01 ENCOUNTER — Encounter: Payer: Self-pay | Admitting: Cardiovascular Disease

## 2020-10-01 ENCOUNTER — Ambulatory Visit (INDEPENDENT_AMBULATORY_CARE_PROVIDER_SITE_OTHER): Payer: BC Managed Care – PPO | Admitting: Cardiovascular Disease

## 2020-10-01 VITALS — BP 88/49 | HR 60 | Ht 71.0 in | Wt 261.4 lb

## 2020-10-01 DIAGNOSIS — I4819 Other persistent atrial fibrillation: Secondary | ICD-10-CM

## 2020-10-01 DIAGNOSIS — Z6836 Body mass index (BMI) 36.0-36.9, adult: Secondary | ICD-10-CM

## 2020-10-01 DIAGNOSIS — Z950 Presence of cardiac pacemaker: Secondary | ICD-10-CM | POA: Diagnosis not present

## 2020-10-01 DIAGNOSIS — I495 Sick sinus syndrome: Secondary | ICD-10-CM | POA: Diagnosis not present

## 2020-10-01 DIAGNOSIS — I10 Essential (primary) hypertension: Secondary | ICD-10-CM | POA: Diagnosis not present

## 2020-10-01 DIAGNOSIS — I5032 Chronic diastolic (congestive) heart failure: Secondary | ICD-10-CM

## 2020-10-01 DIAGNOSIS — G4733 Obstructive sleep apnea (adult) (pediatric): Secondary | ICD-10-CM

## 2020-10-01 NOTE — Progress Notes (Addendum)
Patient ID: Shane [name], male   DOB: July 10, 1946, 75 y.o.   MRN: [id number]     Cardiology Office Note    Date:  10/02/2020   ID:  Shane, [name] 1946/03/18, MRN [id number]  PCP:  Albina Billet, MD  Cardiologist:   Sanda Klein, MD   Chief Complaint  Patient presents with  . Pacemaker Check  . Atrial Fibrillation    History of Present Illness:  Shane [name] is a 75 y.o. male who presents for recurrent persistent atrial fibrillation and pacemaker check.  He has a history of tachycardia-bradycardia syndrome, chronic diastolic heart failure (last echo April 2021 shows mildly depressed LVEF 45-50%), morbid obesity, obstructive sleep apnea on CPAP.  He underwent radiofrequency ablation for atrial fibrillation in August 2018 with a recurrent atrial fibrillation requiring cardioversion in February 2021 and June 2021, then started on amiodarone and underwent successful cardioversion with lasting benefit on April 11, 2020.  He underwent pacemaker generator change out last month [facility], August 06, 2020).  A couple of weeks later he had a small red the draining spot at the medial corner of the surgical site and Dr. Already removed a stitch from that area and gave him a short course of antibiotics.  Subsequently the wound has healed very nicely.  There is no evidence of redness, tenderness or any additional drainage.  The scar has sealed over.  He has not had any fever or chills.  Interrogation of his pacemaker today shows that he has not had any atrial fibrillation since device change out.  He has 100% atrial pacing and does not have any underlying atrial activity, although he has normal AV conduction.  He only has 12% ventricular pacing.  His heart rate histogram is extremely blunted and I increased the contribution of his minute ventilation sensor today.  He has had some fatigue problems.  He reports compliance with CPAP.  He denies daytime hypersomnolence.  He has not  had problems with shortness of breath or chest discomfort.  His borderline low blood pressure today concerns him, but he has not had dizziness lightheadedness or syncope.  He denies any focal neurological events.  He has not had any bleeding problems on Eliquis.  Past Medical History:  Diagnosis Date  . Atrioventricular block   . Degenerative joint disease   . Gout   . Paroxysmal atrial fibrillation (HCC)   . Presence of permanent cardiac pacemaker 03/12/10   guidant  ([place])  . Sinus node dysfunction (HCC)   . Sleep apnea    CPAP  . Wears hearing aid in both ears     Past Surgical History:  Procedure Laterality Date  . ATRIAL FIBRILLATION ABLATION N/A 04/03/2017   Procedure: Atrial Fibrillation Ablation;  Surgeon: Constance Haw, MD;  Location: [place] [facility];  Service: Cardiovascular;  Laterality: N/A;  . CARDIAC CATHETERIZATION  10/13/1994   No CAD  . CARDIAC CATHETERIZATION  05/10/2003   No CAD  . CARDIOVERSION N/A 10/21/2016   Procedure: CARDIOVERSION;  Surgeon: Sanda Klein, MD;  Location: [facility] ENDOSCOPY;  Service: Cardiovascular;  Laterality: N/A;  . CARDIOVERSION N/A 05/15/2017   Procedure: CARDIOVERSION;  Surgeon: Fay Records, MD;  Location: [facility] ENDOSCOPY;  Service: Cardiovascular;  Laterality: N/A;  . CARDIOVERSION N/A 10/21/2019   Procedure: CARDIOVERSION;  Surgeon: Sanda Klein, MD;  Location: [place] [facility];  Service: Cardiovascular;  Laterality: N/A;  . CARDIOVERSION N/A 02/24/2020   Procedure: CARDIOVERSION;  Surgeon: Lelon Perla,  MD;  Location: [place];  Service: Cardiovascular;  Laterality: N/A;  . CARDIOVERSION N/A 04/11/2020   Procedure: CARDIOVERSION;  Surgeon: Skeet Latch, MD;  Location: [place];  Service: Cardiovascular;  Laterality: N/A;  . CATARACT EXTRACTION W/PHACO Left 05/18/2019   Procedure: CATARACT EXTRACTION PHACO AND INTRAOCULAR LENS PLACEMENT ([facility]) LEFT  1:21 11.0% 9.01;  Surgeon: Leandrew Koyanagi, MD;   Location: [place];  Service: Ophthalmology;  Laterality: Left;  sleep apnea  . CATARACT EXTRACTION W/PHACO Right 06/29/2019   Procedure: CATARACT EXTRACTION PHACO AND INTRAOCULAR LENS PLACEMENT ([facility]) RIGHT  01:14.2  14.5%  10.92;  Surgeon: Leandrew Koyanagi, MD;  Location: [place];  Service: Ophthalmology;  Laterality: Right;  sleep apnea  . CYST EXCISION  03/2019   on back   . NM MYOCAR PERF WALL MOTION  05/07/2011   Lexiscan: No ishcemia  . PERMANENT PACEMAKER INSERTION  03/12/2010   guidant  . PPM GENERATOR CHANGEOUT N/A 08/06/2020   Procedure: PPM GENERATOR CHANGEOUT;  Surgeon: Sanda Klein, MD;  Location: [place] [facility];  Service: Cardiovascular;  Laterality: N/A;  . ROTATOR CUFF REPAIR  1996  . SHOULDER ARTHROSCOPY    . US ECHOCARDIOGRAPHY  05/07/2011   mod. LVH,LA mod. dilated,borderline aortic root dilatation    Outpatient Medications Prior to Visit  Medication Sig Dispense Refill  . acetaminophen (TYLENOL) 650 MG CR tablet Take 1,300 mg by mouth every 8 (eight) hours as needed for pain.    Marland Kitchen allopurinol (ZYLOPRIM) 300 MG tablet Take 1 tablet (300 mg total) by mouth daily. 30 tablet 2  . amiodarone (PACERONE) 200 MG tablet Take 1 tablet (200 mg total) by mouth daily. 30 tablet 4  . anastrozole (ARIMIDEX) 1 MG tablet Take 1 mg by mouth at bedtime.     Marland Kitchen apixaban (ELIQUIS) 5 MG TABS tablet Take 1 tablet (5 mg total) by mouth 2 (two) times daily. 60 tablet 4  . ARTIFICIAL TEAR SOLUTION OP Place 1 drop into both eyes daily as needed (dry eyes).    . Ascorbic Acid (VITAMIN C) 1000 MG tablet Take 1,000 mg by mouth daily.    . Cholecalciferol (VITAMIN D-3) 1000 UNITS CAPS Take 1,000 Units by mouth at bedtime.     . citalopram (CELEXA) 40 MG tablet Take 1 tablet (40 mg total) by mouth daily. MUST RECEIVE FUTURE REFILLS FROM PCP. (Patient taking differently: Take 40 mg by mouth at bedtime.) 30 tablet 0  . Co-Enzyme Q-10 100 MG CAPS Take 400 mg by mouth daily.      . colchicine 0.6 MG tablet Take 0.5 tablets (0.3 mg total) by mouth daily as needed. (Patient taking differently: Take 0.3 mg by mouth daily as needed (gout).) 30 tablet 2  . diltiazem (CARDIZEM CD) 120 MG 24 hr capsule Take 1 capsule (120 mg total) by mouth daily. 90 capsule 1  . furosemide (LASIX) 40 MG tablet Take 1.5 tablets (60 mg total) by mouth daily. (Patient taking differently: Take 40 mg by mouth daily.) 45 tablet 4  . Multiple Vitamin (MULTIVITAMIN WITH MINERALS) TABS tablet Take 1 tablet by mouth daily.    . Omega-3 Fatty Acids (FISH OIL) 1200 MG CAPS Take 1,200 mg by mouth 2 (two) times daily.     Marland Kitchen OVER THE COUNTER MEDICATION Take 100 mg by mouth daily. CBD oil    . Phenylephrine-APAP-guaiFENesin (MUCINEX SINUS-MAX CONG & PAIN PO) Take 2 tablets by mouth daily as needed (sinus headaches).     . potassium chloride SA (KLOR-CON)  20 MEQ tablet Take 1 tablet (20 mEq total) by mouth daily. 90 tablet 3  . psyllium (HYDROCIL/METAMUCIL) 95 % PACK Take 1 packet by mouth at bedtime.    . pyridOXINE (VITAMIN B-6) 100 MG tablet Take 100 mg by mouth daily.    . rosuvastatin (CRESTOR) 5 MG tablet Take 5 mg by mouth daily.    . sodium chloride (OCEAN) 0.65 % SOLN nasal spray Place 1 spray into both nostrils as needed for congestion.    . tamsulosin (FLOMAX) 0.4 MG CAPS capsule Take 1 capsule (0.4 mg total) by mouth daily. (Patient taking differently: Take 0.4 mg by mouth at bedtime.) 30 capsule 4  . Testosterone 1.62 % GEL Apply 3 Pump topically daily. Shoulders  1  . vitamin B-12 (CYANOCOBALAMIN) 1000 MCG tablet Take 1,000 mcg by mouth daily.    Marland Kitchen zinc gluconate 50 MG tablet Take 50 mg by mouth daily.     No facility-administered medications prior to visit.     Allergies:   Patient has no known allergies.   Social History   Socioeconomic History  . Marital status: Married    Spouse name: Not on file  . Number of children: Not on file  . Years of education: Not on file  . Highest  education level: Not on file  Occupational History  . Not on file  Tobacco Use  . Smoking status: Former Smoker    Types: Cigars    Quit date: 05/11/1993    Years since quitting: 27.4  . Smokeless tobacco: Former Systems developer    Types: Chew    Quit date: 05/11/1993  Vaping Use  . Vaping Use: Never used  Substance and Sexual Activity  . Alcohol use: Not Currently    Comment: social  . Drug use: No  . Sexual activity: Not on file  Other Topics Concern  . Not on file  Social History Narrative  . Not on file   Social Determinants of Health   Financial Resource Strain: Not on file  Food Insecurity: Not on file  Transportation Needs: Not on file  Physical Activity: Not on file  Stress: Not on file  Social Connections: Not on file     Family History:  The patient's family history includes Alzheimer's disease in his father; Dementia in his sister; Heart disease in his sister; Heart failure in his mother; Stroke in his mother.   ROS:   Please see the history of present illness.   All other systems are reviewed and are negative.   PHYSICAL EXAM:   VS:  BP (!) 88/49   Pulse 60   Ht 5\' 11"  (1.803 m)   Wt 261 lb 6.4 oz (118.6 kg)   SpO2 98%   BMI 36.46 kg/m      General: Alert, oriented x3, no distress, severely obese, well-healed left subclavian pacemaker scar Head: no evidence of trauma, PERRL, EOMI, no exophtalmos or lid lag, no myxedema, no xanthelasma; normal ears, nose and oropharynx Neck: normal jugular venous pulsations and no hepatojugular reflux; brisk carotid pulses without delay and no carotid bruits Chest: clear to auscultation, no signs of consolidation by percussion or palpation, normal fremitus, symmetrical and full respiratory excursions Cardiovascular: normal position and quality of the apical impulse, regular rhythm, normal first and second heart sounds, no murmurs, rubs or gallops Abdomen: no tenderness or distention, no masses by palpation, no abnormal pulsatility  or arterial bruits, normal bowel sounds, no hepatosplenomegaly Extremities: no clubbing, cyanosis or edema; 2+ radial, ulnar  and brachial pulses bilaterally; 2+ right femoral, posterior tibial and dorsalis pedis pulses; 2+ left femoral, posterior tibial and dorsalis pedis pulses; no subclavian or femoral bruits Neurological: grossly nonfocal Psych: Normal mood and affect     Wt Readings from Last 3 Encounters:  10/01/20 261 lb 6.4 oz (118.6 kg)  08/06/20 258 lb 6.4 oz (117.2 kg)  07/23/20 258 lb 6.4 oz (117.2 kg)      Studies/Labs Reviewed:   EKG:  EKG is not ordered today.  The intra-cardiac electrogram shows atrial paced, ventricular sensed rhythm  ASSESSMENT:    1. Persistent atrial fibrillation ([place])   2. SSS (sick sinus syndrome) ([place])   3. Essential hypertension   4. Class 2 severe obesity due to excess calories with serious comorbidity and body mass index (BMI) of 36.0 to 36.9 in adult ([place])   5. Presence of cardiac pacemaker   6. Chronic diastolic CHF (congestive heart failure) ([place])   7. OSA (obstructive sleep apnea)      PLAN:  In order of problems listed above:  1.  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  Compliant with anticoagulation with Eliquis.  CHA2DS2-VASc 4 (age 48, CHF, hypertension).   No history of embolic events. 2. SSS: Heart rate histogram appears quite blunted.  Minute ventilation sensor increased today.  Reevaluate in a couple of months. 3. HTN: Blood pressure fairly low today.  We will stop his diltiazem permanently. 4. Obesity: Strongly encouraged renewed efforts at weight loss, since the behavior he is the more likely is to have recurrent atrial fibrillation 5. PM: Generator change roughly 1 month ago, had some problems with a stitch at the medial corner of the wound, but this has now healed very nicely.  Remote downloads every 3 months.  His current pacing  system is MRI conditional. 6. CHF: He does not have signs or symptoms of hypervolemia and does not have a lot of issues with exertional dyspnea.  His echocardiogram shows slight reduction in LVEF to 45-50%, and with his new device we have been able to avoid the high burden of ventricular pacing that he had with the old system.  We will recheck an echocardiogram after several months to see if reduction in V pacing is accompanied by improved LV ejection fraction. 7. OSA: He reports 100% compliance with CPAP and he denies daytime hypersomnolence.  We were unable to estimate PA pressure on his last echo.  Have not yet been able to get him back in the sleep clinic with Dr. Claiborne Billings.     Medication Adjustments/Labs and Tests Ordered: Current medicines are reviewed at length with the patient today.  Concerns regarding medicines are outlined above.  Medication changes, Labs and Tests ordered today are listed in the Patient Instructions below. Patient Instructions  Medication Instructions:  No changes *If you need a refill on your cardiac medications before your next appointment, please call your pharmacy*   Lab Work: None ordered If you have labs (blood work) drawn today and your tests are completely normal, you will receive your results only by: Marland Kitchen MyChart Message (if you have MyChart) OR . A paper copy in the mail If you have any lab test that is abnormal or we need to change your treatment, we will call you to review the results.   Testing/Procedures: None ordered   Follow-Up: At [facility], you and your health needs are our priority.  As part of  our continuing mission to provide you with exceptional heart care, we have created designated Provider Care Teams.  These Care Teams include your primary Cardiologist (physician) and Advanced Practice Providers (APPs -  Physician Assistants and Nurse Practitioners) who all work together to provide you with the care you need, when you need it.  We  recommend signing up for the patient portal called "MyChart".  Sign up information is provided on this After Visit Summary.  MyChart is used to connect with patients for Virtual Visits (Telemedicine).  Patients are able to view lab/test results, encounter notes, upcoming appointments, etc.  Non-urgent messages can be sent to your provider as well.   To learn more about what you can do with MyChart, go to [email or web address].    Your next appointment:   Keep your appointment in March      Signed, Kearia Yin, MD  10/02/2020 7:08 PM    [address], [place], [place]  [address] Phone: [phone]; Fax: [phone]

## 2020-10-01 NOTE — Patient Instructions (Signed)
Medication Instructions:  No changes *If you need a refill on your cardiac medications before your next appointment, please call your pharmacy*   Lab Work: None ordered If you have labs (blood work) drawn today and your tests are completely normal, you will receive your results only by: Marland Kitchen MyChart Message (if you have MyChart) OR . A paper copy in the mail If you have any lab test that is abnormal or we need to change your treatment, we will call you to review the results.   Testing/Procedures: None ordered   Follow-Up: At [facility], you and your health needs are our priority.  As part of our continuing mission to provide you with exceptional heart care, we have created designated Provider Care Teams.  These Care Teams include your primary Cardiologist (physician) and Advanced Practice Providers (APPs -  Physician Assistants and Nurse Practitioners) who all work together to provide you with the care you need, when you need it.  We recommend signing up for the patient portal called "MyChart".  Sign up information is provided on this After Visit Summary.  MyChart is used to connect with patients for Virtual Visits (Telemedicine).  Patients are able to view lab/test results, encounter notes, upcoming appointments, etc.  Non-urgent messages can be sent to your provider as well.   To learn more about what you can do with MyChart, go to [email or web address].    Your next appointment:   Keep your appointment in March

## 2020-10-27 ENCOUNTER — Other Ambulatory Visit: Payer: Self-pay | Admitting: Cardiovascular Disease

## 2020-10-31 ENCOUNTER — Other Ambulatory Visit: Payer: Self-pay

## 2020-10-31 MED ORDER — AMIODARONE HCL 200 MG PO TABS
200.0000 mg | ORAL_TABLET | Freq: Every day | ORAL | 6 refills | Status: DC
Start: 2020-10-31 — End: 2021-04-18

## 2020-11-26 ENCOUNTER — Other Ambulatory Visit: Payer: Self-pay

## 2020-11-26 ENCOUNTER — Encounter: Payer: Self-pay | Admitting: Cardiovascular Disease

## 2020-11-26 ENCOUNTER — Ambulatory Visit (INDEPENDENT_AMBULATORY_CARE_PROVIDER_SITE_OTHER): Payer: BC Managed Care – PPO | Admitting: Cardiovascular Disease

## 2020-11-26 VITALS — BP 118/68 | HR 78 | Ht 71.0 in | Wt 262.0 lb

## 2020-11-26 DIAGNOSIS — I484 Atypical atrial flutter: Secondary | ICD-10-CM | POA: Diagnosis not present

## 2020-11-26 DIAGNOSIS — G4733 Obstructive sleep apnea (adult) (pediatric): Secondary | ICD-10-CM

## 2020-11-26 DIAGNOSIS — Z950 Presence of cardiac pacemaker: Secondary | ICD-10-CM

## 2020-11-26 DIAGNOSIS — I495 Sick sinus syndrome: Secondary | ICD-10-CM

## 2020-11-26 DIAGNOSIS — I4819 Other persistent atrial fibrillation: Secondary | ICD-10-CM | POA: Diagnosis not present

## 2020-11-26 DIAGNOSIS — I5032 Chronic diastolic (congestive) heart failure: Secondary | ICD-10-CM

## 2020-11-26 DIAGNOSIS — I10 Essential (primary) hypertension: Secondary | ICD-10-CM

## 2020-11-26 DIAGNOSIS — Z6836 Body mass index (BMI) 36.0-36.9, adult: Secondary | ICD-10-CM

## 2020-11-26 NOTE — Progress Notes (Signed)
Patient ID: Shane [name], male   DOB: [birth year], 75 y.o.   MRN: [id number]     Cardiology Office Note    Date:  11/26/2020   [name] 1946-05-20, MRN [id number]  PCP:  Albina Billet, MD  Cardiologist:   Sanda Klein, MD   Chief Complaint  Patient presents with  . Atrial Fibrillation    History of Present Illness:  Shane [name] is a 75 y.o. male who presents for recurrent persistent atrial fibrillation and pacemaker check.  He has a history of tachycardia-bradycardia syndrome, chronic diastolic heart failure (last echo April 2021 shows mildly depressed LVEF 45-50%), morbid obesity, obstructive sleep apnea on CPAP.  He underwent radiofrequency ablation for atrial fibrillation in August 2018 with a recurrent atrial fibrillation requiring cardioversion in February 2021 and June 2021, then started on amiodarone and underwent successful cardioversion with lasting benefit on April 11, 2020.  He underwent pacemaker generator change out last December ([place], August 06, 2020).  The site has healed well.  He was generally feeling fine until last Wednesday 03 23 when he developed "chest congestion".  He started taking some type of medication for this and it seems to be getting better.  Interestingly, his pacemaker showed that he went back into atrial fibrillation on Tuesday 03/20 2 in the afternoon.  He is not aware of any palpitations.  Rate control has been good.  He has not had any focal neurological events.  He is compliant with his Eliquis and has not had falls or bleeding problems.  His pacemaker shows 25% atrial pacing roughly 61% ventricular pacing.  He has been in uninterrupted atrial fibrillation since 03/22 at roughly 3 PM.  He is on amiodarone.  The atrial tachyarrhythmia seems to be fairly regular, probably atrial flutter with a cycle length of right around 300 ms.  I attempted to perform overdrive pacing with gradually decreasing cycle lengths  from 280 all the way down to 200 ms.  The arrhythmia would seem to very briefly interrupt for less than a second, after which would restart.  At the faster cycle lengths, overdrive arrhythmia caused the arrhythmia to accelerate, but eventually it settled back into a similar pattern as on arrival.  He thought that his statin was causing worsening muscle cramps and decided to discontinue it.  Taking co-Q10 did not seem to make much of a difference.  He denies edema, orthopnea, PND, dizziness, syncope, focal neurological complaints, claudication.  He reports compliance with CPAP and denies daytime hypersomnolence.  He does not have angina pectoris.  Past Medical History:  Diagnosis Date  . Atrioventricular block   . Degenerative joint disease   . Gout   . Paroxysmal atrial fibrillation (HCC)   . Presence of permanent cardiac pacemaker 03/12/10   guidant  ([place])  . Sinus node dysfunction (HCC)   . Sleep apnea    CPAP  . Wears hearing aid in both ears     Past Surgical History:  Procedure Laterality Date  . ATRIAL FIBRILLATION ABLATION N/A 04/03/2017   Procedure: Atrial Fibrillation Ablation;  Surgeon: Constance Haw, MD;  Location: [place] [facility];  Service: Cardiovascular;  Laterality: N/A;  . CARDIAC CATHETERIZATION  10/13/1994   No CAD  . CARDIAC CATHETERIZATION  05/10/2003   No CAD  . CARDIOVERSION N/A 10/21/2016   Procedure: CARDIOVERSION;  Surgeon: Sanda Klein, MD;  [facility];  Service: Cardiovascular;  Laterality: N/A;  . CARDIOVERSION N/A  05/15/2017   Procedure: CARDIOVERSION;  Surgeon: Fay Records, MD;  Location: [place];  Service: Cardiovascular;  Laterality: N/A;  . CARDIOVERSION N/A 10/21/2019   Procedure: CARDIOVERSION;  Surgeon: Sanda Klein, MD;  [facility];  Service: Cardiovascular;  Laterality: N/A;  . CARDIOVERSION N/A 02/24/2020   Procedure: CARDIOVERSION;  Surgeon: Lelon Perla, MD;  Location: [facility];   Service: Cardiovascular;  Laterality: N/A;  . CARDIOVERSION N/A 04/11/2020   Procedure: CARDIOVERSION;  Surgeon: Skeet Latch, MD;  Location: [place];  Service: Cardiovascular;  Laterality: N/A;  . CATARACT EXTRACTION W/PHACO Left 05/18/2019   Procedure: CATARACT EXTRACTION PHACO AND INTRAOCULAR LENS PLACEMENT ([place]) LEFT  1:21 11.0% 9.01;  Surgeon: Leandrew Koyanagi, MD;  Location: [place];  Service: Ophthalmology;  Laterality: Left;  sleep apnea  . CATARACT EXTRACTION W/PHACO Right 06/29/2019   Procedure: CATARACT EXTRACTION PHACO AND INTRAOCULAR LENS PLACEMENT (IOC) RIGHT  01:14.2  14.5%  10.92;  Surgeon: Leandrew Koyanagi, MD;  Location: [place];  Service: Ophthalmology;  Laterality: Right;  sleep apnea  . CYST EXCISION  03/2019   on back   . NM MYOCAR PERF WALL MOTION  05/07/2011   Lexiscan: No ishcemia  . PERMANENT PACEMAKER INSERTION  03/12/2010   guidant  . PPM GENERATOR CHANGEOUT N/A 08/06/2020   Procedure: PPM GENERATOR CHANGEOUT;  Surgeon: Sanda Klein, MD;  Location: [place] [facility];  Service: Cardiovascular;  Laterality: N/A;  . ROTATOR CUFF REPAIR  1996  . SHOULDER ARTHROSCOPY    . US ECHOCARDIOGRAPHY  05/07/2011   mod. LVH,LA mod. dilated,borderline aortic root dilatation    Outpatient Medications Prior to Visit  Medication Sig Dispense Refill  . acetaminophen (TYLENOL) 650 MG CR tablet Take 1,300 mg by mouth every 8 (eight) hours as needed for pain.    Marland Kitchen allopurinol (ZYLOPRIM) 300 MG tablet Take 1 tablet (300 mg total) by mouth daily. 30 tablet 2  . amiodarone (PACERONE) 200 MG tablet Take 1 tablet (200 mg total) by mouth daily. 30 tablet 6  . anastrozole (ARIMIDEX) 1 MG tablet Take 1 mg by mouth at bedtime.     Marland Kitchen apixaban (ELIQUIS) 5 MG TABS tablet Take 1 tablet (5 mg total) by mouth 2 (two) times daily. 60 tablet 4  . ARTIFICIAL TEAR SOLUTION OP Place 1 drop into both eyes daily as needed (dry eyes).    . Ascorbic Acid (VITAMIN C)  1000 MG tablet Take 1,000 mg by mouth daily.    . Cholecalciferol (VITAMIN D-3) 1000 UNITS CAPS Take 1,000 Units by mouth at bedtime.     . citalopram (CELEXA) 40 MG tablet Take 1 tablet (40 mg total) by mouth daily. MUST RECEIVE FUTURE REFILLS FROM PCP. (Patient taking differently: Take 40 mg by mouth at bedtime.) 30 tablet 0  . Co-Enzyme Q-10 100 MG CAPS Take 400 mg by mouth daily.     . colchicine 0.6 MG tablet Take 0.5 tablets (0.3 mg total) by mouth daily as needed. (Patient taking differently: Take 0.3 mg by mouth daily as needed (gout).) 30 tablet 2  . diltiazem (CARDIZEM CD) 120 MG 24 hr capsule Take 1 capsule (120 mg total) by mouth daily. 90 capsule 1  . furosemide (LASIX) 40 MG tablet Take 1.5 tablets (60 mg total) by mouth daily. (Patient taking differently: Take 40 mg by mouth daily.) 45 tablet 4  . montelukast (SINGULAIR) 10 MG tablet Take 10 mg by mouth daily.    . Multiple Vitamin (MULTIVITAMIN WITH MINERALS) TABS tablet  Take 1 tablet by mouth daily.    . Omega-3 Fatty Acids (FISH OIL) 1200 MG CAPS Take 1,200 mg by mouth 2 (two) times daily.     Marland Kitchen OVER THE COUNTER MEDICATION Take 100 mg by mouth daily. CBD oil    . Phenylephrine-APAP-guaiFENesin (MUCINEX SINUS-MAX CONG & PAIN PO) Take 2 tablets by mouth daily as needed (sinus headaches).     . potassium chloride SA (KLOR-CON) 20 MEQ tablet Take 1 tablet (20 mEq total) by mouth daily. 90 tablet 3  . psyllium (HYDROCIL/METAMUCIL) 95 % PACK Take 1 packet by mouth at bedtime.    . pyridOXINE (VITAMIN B-6) 100 MG tablet Take 100 mg by mouth daily.    . sodium chloride (OCEAN) 0.65 % SOLN nasal spray Place 1 spray into both nostrils as needed for congestion.    . tamsulosin (FLOMAX) 0.4 MG CAPS capsule Take 1 capsule (0.4 mg total) by mouth daily. (Patient taking differently: Take 0.4 mg by mouth at bedtime.) 30 capsule 4  . Testosterone 1.62 % GEL Apply 3 Pump topically daily. Shoulders  1  . vitamin B-12 (CYANOCOBALAMIN) 1000 MCG tablet  Take 1,000 mcg by mouth daily.    Marland Kitchen zinc gluconate 50 MG tablet Take 50 mg by mouth daily.    . rosuvastatin (CRESTOR) 5 MG tablet Take 5 mg by mouth daily.     No facility-administered medications prior to visit.     Allergies:   Patient has no known allergies.   Social History   Socioeconomic History  . Marital status: Married    Spouse name: Not on file  . Number of children: Not on file  . Years of education: Not on file  . Highest education level: Not on file  Occupational History  . Not on file  Tobacco Use  . Smoking status: Former Smoker    Types: Cigars    Quit date: 05/11/1993    Years since quitting: 27.5  . Smokeless tobacco: Former Systems developer    Types: Chew    Quit date: 05/11/1993  Vaping Use  . Vaping Use: Never used  Substance and Sexual Activity  . Alcohol use: Not Currently    Comment: social  . Drug use: No  . Sexual activity: Not on file  Other Topics Concern  . Not on file  Social History Narrative  . Not on file   Social Determinants of Health   Financial Resource Strain: Not on file  Food Insecurity: Not on file  Transportation Needs: Not on file  Physical Activity: Not on file  Stress: Not on file  Social Connections: Not on file     Family History:  The patient's family history includes Alzheimer's disease in his father; Dementia in his sister; Heart disease in his sister; Heart failure in his mother; Stroke in his mother.   ROS:   Please see the history of present illness.   All other systems are reviewed and are negative.   PHYSICAL EXAM:   VS:  BP 118/68   Pulse 78   Ht 5\' 11"  (1.803 m)   Wt 262 lb (118.8 kg)   SpO2 98%   BMI 36.54 kg/m       General: Alert, oriented x3, no distress, well-healed pacemaker site.  Obese Head: no evidence of trauma, PERRL, EOMI, no exophtalmos or lid lag, no myxedema, no xanthelasma; normal ears, nose and oropharynx Neck: normal jugular venous pulsations and no hepatojugular reflux; brisk carotid  pulses without delay and no carotid bruits  Chest: clear to auscultation, no signs of consolidation by percussion or palpation, normal fremitus, symmetrical and full respiratory excursions Cardiovascular: normal position and quality of the apical impulse, irregular rhythm, normal first and second heart sounds, no murmurs, rubs or gallops Abdomen: no tenderness or distention, no masses by palpation, no abnormal pulsatility or arterial bruits, normal bowel sounds, no hepatosplenomegaly Extremities: no clubbing, cyanosis or edema; 2+ radial, ulnar and brachial pulses bilaterally; 2+ right femoral, posterior tibial and dorsalis pedis pulses; 2+ left femoral, posterior tibial and dorsalis pedis pulses; no subclavian or femoral bruits Neurological: grossly nonfocal Psych: Normal mood and affect     Wt Readings from Last 3 Encounters:  11/26/20 262 lb (118.8 kg)  10/01/20 261 lb 6.4 oz (118.6 kg)  08/06/20 258 lb 6.4 oz (117.2 kg)      Studies/Labs Reviewed:   EKG:  EKG is performed today and shows atrial fibrillation with mostly ventricular sinus rhythm, occasional ventricular pacing.  ASSESSMENT:    1. SSS (sick sinus syndrome) ([place])   2. Essential hypertension      PLAN:  In order of problems listed above:  1.  AFib s/p RFA 2018/atypical atrial flutter: Successfully maintained normal rhythm following cardioversion in August, while amiodarone, until the current episode that began on March 22.  Seems to be an atypical atrial flutter, but overdrive pacing was not successful today (the same thing happened last year).  Compliant with anticoagulation with Eliquis.  CHA2DS2-VASc 4 (age 21, CHF, hypertension).   No history of embolic events.  It is remarkable that his "chest congestion" began the day after the onset of the arrhythmia, suggesting that it may not be quite as asymptomatic as initially appeared.  On the other hand he is not very symptomatic and seems to be improving spontaneously.  He  is well rate controlled.  He has no overt findings of heart failure today.  He has a busy schedule coming up and is not ready to consider cardioversion at this time.  We will plan to bring him back to the office on April 25, with a plan for cardioversion on April 27 if the arrhythmia persists. 2. SSS: Heart rate histogram appears quite blunted.  Minute ventilation sensor increased today.  Reevaluate in a couple of months. 3. HTN: Blood pressure is well controlled with minimal medications. 4. Obesity: We have discussed the relationship between weight and the burden of atrial fibrillation. 5. PM: With the ability to program longer AV delays, the burden of ventricular pacing is lower, but he still has 61% ventricular pacing.  He has not yet received his home monitor due to the backlog.  I contacted the [employer] today and reminded him that Mr. Olafson needs to receive his monitor soon as possible.  This will make it easier to make decisions regarding the management of his atrial fibrillation. 6. CHF: He is not overtly hypervolemic and not particularly symptomatic today.Marland Kitchen  His echocardiogram shows slight reduction in LVEF to 45-50%.  We will plan to recheck this once we get him back in normal rhythm. 7. OSA: He reports 100% compliance with CPAP and he denies daytime hypersomnolence.  We were unable to estimate PA pressure on his last echo.  I reinforced the need for compliance with CPAP.     Medication Adjustments/Labs and Tests Ordered: Current medicines are reviewed at length with the patient today.  Concerns regarding medicines are outlined above.  Medication changes, Labs and Tests ordered today are listed in the Patient Instructions  below. Patient Instructions  Medication Instructions:  No changes *If you need a refill on your cardiac medications before your next appointment, please call your pharmacy*   Follow-Up: At [facility], you and your health needs are our  priority.  As part of our continuing mission to provide you with exceptional heart care, we have created designated Provider Care Teams.  These Care Teams include your primary Cardiologist (physician) and Advanced Practice Providers (APPs -  Physician Assistants and Nurse Practitioners) who all work together to provide you with the care you need, when you need it.  We recommend signing up for the patient portal called "MyChart".  Sign up information is provided on this After Visit Summary.  MyChart is used to connect with patients for Virtual Visits (Telemedicine).  Patients are able to view lab/test results, encounter notes, upcoming appointments, etc.  Non-urgent messages can be sent to your provider as well.   To learn more about what you can do with MyChart, go to [email or web address].    Your next appointment:   Follow up on 4/25 at 8 am with Dr. Sallyanne Kuster  Other Instructions  You are scheduled for a Cardioversion on 12/26/20 with Dr. Sallyanne Kuster.  Please arrive at the [facility] (Main Entrance A) at [facility]: [address] [place], [place] [address] at 9 am. (1 hour prior to procedure)  DIET: Nothing to eat or drink after midnight except a sip of water with medications (see medication instructions below)  Medication Instructions: Hold furosemide the morning of the procedure.  Continue your anticoagulant: Eliquis You will need to continue your anticoagulant after your procedure until you  are told by your provider that it is safe to stop   Labs:  Your provider would like for you to return on 12/24/20 to have the following labs drawn: CBC and BMET. You do not need an appointment for the lab. Once in our office lobby there is a podium where you can sign in and ring the doorbell to alert [place] that you are here. The lab is open from 8:00 am to 4:30 pm; closed for lunch from 12:45pm-1:45pm.  You will need to have the coronavirus test completed prior to your procedure. An appointment  has been made at 8:50 am on 12/24/20. This is a Drive Up Visit at [address], [place], [place] [address]. Please tell them that you are there for procedure testing. Stay in your car and someone will be with you shortly. Please make sure to have all other labs completed before this test because you will need to stay quarantined until your procedure.  You must have a responsible person to drive you home and stay in the waiting area during your procedure. Failure to do so could result in cancellation.  Bring your insurance cards.  *Special Note: Every effort is made to have your procedure done on time. Occasionally there are emergencies that occur at the hospital that may cause delays. Please be patient if a delay does occur.         Signed, Sanda Klein, MD  11/26/2020 2:29 PM    [address], [place], [place]  [address] Phone: [phone]; Fax: [phone]    Patient ID: Shane [name], male   DOB: 16-Oct-1945, 75 y.o.   MRN: [id number]     Cardiology Office Note    Date:  11/26/2020   ID:  Raynald, Rouillard 1945-10-10, MRN [id number]  PCP:  Hall Busing,  Leona Carry, MD  Cardiologist:   Sanda Klein, MD   Chief Complaint  Patient presents with  . Atrial Fibrillation    History of Present Illness:  Shane [name] is a 75 y.o. male who presents for recurrent persistent atrial fibrillation and pacemaker check.  He has a history of tachycardia-bradycardia syndrome, chronic diastolic heart failure (last echo April 2021 shows mildly depressed LVEF 45-50%), morbid obesity, obstructive sleep apnea on CPAP.  He underwent radiofrequency ablation for atrial fibrillation in August 2018 with a recurrent atrial fibrillation requiring cardioversion in February 2021 and June 2021, then started on amiodarone and underwent successful cardioversion with lasting benefit on April 11, 2020.  He underwent pacemaker generator change out last month [facility], August 06, 2020).  A couple of weeks later he had a small red the draining spot at the medial corner of the surgical site and Dr. Already removed a stitch from that area and gave him a short course of antibiotics.  Subsequently the wound has healed very nicely.  There is no evidence of redness, tenderness or any additional drainage.  The scar has sealed over.  He has not had any fever or chills.  Interrogation of his pacemaker today shows that he has not had any atrial fibrillation since device change out.  He has 100% atrial pacing and does not have any underlying atrial activity, although he has normal AV conduction.  He only has 12% ventricular pacing.  His heart rate histogram is extremely blunted and I increased the contribution of his minute ventilation sensor today.  He has had some fatigue problems.  He reports compliance with CPAP.  He denies daytime hypersomnolence.  He has not had problems with shortness of breath or chest discomfort.  His borderline low blood pressure today concerns him, but he has not had dizziness lightheadedness or syncope.  He denies any focal neurological events.  He has not had any bleeding problems on Eliquis.  Past Medical History:  Diagnosis Date  . Atrioventricular block   . Degenerative joint disease   . Gout   . Paroxysmal atrial fibrillation (HCC)   . Presence of permanent cardiac pacemaker 03/12/10   guidant  ([place])  . Sinus node dysfunction (HCC)   . Sleep apnea    CPAP  . Wears hearing aid in both ears     Past Surgical History:  Procedure Laterality Date  . ATRIAL FIBRILLATION ABLATION N/A 04/03/2017   Procedure: Atrial Fibrillation Ablation;  Surgeon: Constance Haw, MD;  Location: [place] [facility];  Service: Cardiovascular;  Laterality: N/A;  . CARDIAC CATHETERIZATION  10/13/1994   No CAD  . CARDIAC CATHETERIZATION  05/10/2003   No CAD  . CARDIOVERSION N/A 10/21/2016   Procedure: CARDIOVERSION;   Surgeon: Sanda Klein, MD;  Location: [place];  Service: Cardiovascular;  Laterality: N/A;  . CARDIOVERSION N/A 05/15/2017   Procedure: CARDIOVERSION;  Surgeon: Fay Records, MD;  Location: [facility] ENDOSCOPY;  Service: Cardiovascular;  Laterality: N/A;  . CARDIOVERSION N/A 10/21/2019   Procedure: CARDIOVERSION;  Surgeon: Sanda Klein, MD;  [facility];  Service: Cardiovascular;  Laterality: N/A;  . CARDIOVERSION N/A 02/24/2020   Procedure: CARDIOVERSION;  Surgeon: Lelon Perla, MD;  Location: [facility] ENDOSCOPY;  Service: Cardiovascular;  Laterality: N/A;  . CARDIOVERSION N/A 04/11/2020   Procedure: CARDIOVERSION;  Surgeon: Skeet Latch, MD;  Location: [place];  Service: Cardiovascular;  Laterality: N/A;  . CATARACT EXTRACTION W/PHACO Left 05/18/2019   Procedure: CATARACT EXTRACTION  PHACO AND INTRAOCULAR LENS PLACEMENT ([place]) LEFT  1:21 11.0% 9.01;  Surgeon: Leandrew Koyanagi, MD;  Location: [place];  Service: Ophthalmology;  Laterality: Left;  sleep apnea  . CATARACT EXTRACTION W/PHACO Right 06/29/2019   Procedure: CATARACT EXTRACTION PHACO AND INTRAOCULAR LENS PLACEMENT (IOC) RIGHT  01:14.2  14.5%  10.92;  Surgeon: Leandrew Koyanagi, MD;  Location: [place];  Service: Ophthalmology;  Laterality: Right;  sleep apnea  . CYST EXCISION  03/2019   on back   . NM MYOCAR PERF WALL MOTION  05/07/2011   Lexiscan: No ishcemia  . PERMANENT PACEMAKER INSERTION  03/12/2010   guidant  . PPM GENERATOR CHANGEOUT N/A 08/06/2020   Procedure: PPM GENERATOR CHANGEOUT;  Surgeon: Sanda Klein, MD;  Location: [place] [facility];  Service: Cardiovascular;  Laterality: N/A;  . ROTATOR CUFF REPAIR  1996  . SHOULDER ARTHROSCOPY    . US ECHOCARDIOGRAPHY  05/07/2011   mod. LVH,LA mod. dilated,borderline aortic root dilatation    Outpatient Medications Prior to Visit  Medication Sig Dispense Refill  . acetaminophen (TYLENOL) 650 MG CR tablet Take 1,300 mg by mouth every  8 (eight) hours as needed for pain.    Marland Kitchen allopurinol (ZYLOPRIM) 300 MG tablet Take 1 tablet (300 mg total) by mouth daily. 30 tablet 2  . amiodarone (PACERONE) 200 MG tablet Take 1 tablet (200 mg total) by mouth daily. 30 tablet 6  . anastrozole (ARIMIDEX) 1 MG tablet Take 1 mg by mouth at bedtime.     Marland Kitchen apixaban (ELIQUIS) 5 MG TABS tablet Take 1 tablet (5 mg total) by mouth 2 (two) times daily. 60 tablet 4  . ARTIFICIAL TEAR SOLUTION OP Place 1 drop into both eyes daily as needed (dry eyes).    . Ascorbic Acid (VITAMIN C) 1000 MG tablet Take 1,000 mg by mouth daily.    . Cholecalciferol (VITAMIN D-3) 1000 UNITS CAPS Take 1,000 Units by mouth at bedtime.     . citalopram (CELEXA) 40 MG tablet Take 1 tablet (40 mg total) by mouth daily. MUST RECEIVE FUTURE REFILLS FROM PCP. (Patient taking differently: Take 40 mg by mouth at bedtime.) 30 tablet 0  . Co-Enzyme Q-10 100 MG CAPS Take 400 mg by mouth daily.     . colchicine 0.6 MG tablet Take 0.5 tablets (0.3 mg total) by mouth daily as needed. (Patient taking differently: Take 0.3 mg by mouth daily as needed (gout).) 30 tablet 2  . diltiazem (CARDIZEM CD) 120 MG 24 hr capsule Take 1 capsule (120 mg total) by mouth daily. 90 capsule 1  . furosemide (LASIX) 40 MG tablet Take 1.5 tablets (60 mg total) by mouth daily. (Patient taking differently: Take 40 mg by mouth daily.) 45 tablet 4  . montelukast (SINGULAIR) 10 MG tablet Take 10 mg by mouth daily.    . Multiple Vitamin (MULTIVITAMIN WITH MINERALS) TABS tablet Take 1 tablet by mouth daily.    . Omega-3 Fatty Acids (FISH OIL) 1200 MG CAPS Take 1,200 mg by mouth 2 (two) times daily.     Marland Kitchen OVER THE COUNTER MEDICATION Take 100 mg by mouth daily. CBD oil    . Phenylephrine-APAP-guaiFENesin (MUCINEX SINUS-MAX CONG & PAIN PO) Take 2 tablets by mouth daily as needed (sinus headaches).     . potassium chloride SA (KLOR-CON) 20 MEQ tablet Take 1 tablet (20 mEq total) by mouth daily. 90 tablet 3  . psyllium  (HYDROCIL/METAMUCIL) 95 % PACK Take 1 packet by mouth at bedtime.    Marland Kitchen  pyridOXINE (VITAMIN B-6) 100 MG tablet Take 100 mg by mouth daily.    . sodium chloride (OCEAN) 0.65 % SOLN nasal spray Place 1 spray into both nostrils as needed for congestion.    . tamsulosin (FLOMAX) 0.4 MG CAPS capsule Take 1 capsule (0.4 mg total) by mouth daily. (Patient taking differently: Take 0.4 mg by mouth at bedtime.) 30 capsule 4  . Testosterone 1.62 % GEL Apply 3 Pump topically daily. Shoulders  1  . vitamin B-12 (CYANOCOBALAMIN) 1000 MCG tablet Take 1,000 mcg by mouth daily.    Marland Kitchen zinc gluconate 50 MG tablet Take 50 mg by mouth daily.    . rosuvastatin (CRESTOR) 5 MG tablet Take 5 mg by mouth daily.     No facility-administered medications prior to visit.     Allergies:   Patient has no known allergies.   Social History   Socioeconomic History  . Marital status: Married    Spouse name: Not on file  . Number of children: Not on file  . Years of education: Not on file  . Highest education level: Not on file  Occupational History  . Not on file  Tobacco Use  . Smoking status: Former Smoker    Types: Cigars    Quit date: 05/11/1993    Years since quitting: 27.5  . Smokeless tobacco: Former Systems developer    Types: Chew    Quit date: 05/11/1993  Vaping Use  . Vaping Use: Never used  Substance and Sexual Activity  . Alcohol use: Not Currently    Comment: social  . Drug use: No  . Sexual activity: Not on file  Other Topics Concern  . Not on file  Social History Narrative  . Not on file   Social Determinants of Health   Financial Resource Strain: Not on file  Food Insecurity: Not on file  Transportation Needs: Not on file  Physical Activity: Not on file  Stress: Not on file  Social Connections: Not on file     Family History:  The patient's family history includes Alzheimer's disease in his father; Dementia in his sister; Heart disease in his sister; Heart failure in his mother; Stroke in his  mother.   ROS:   Please see the history of present illness.   All other systems are reviewed and are negative.   PHYSICAL EXAM:   VS:  BP 118/68   Pulse 78   Ht 5\' 11"  (1.803 m)   Wt 262 lb (118.8 kg)   SpO2 98%   BMI 36.54 kg/m      General: Alert, oriented x3, no distress, severely obese, well-healed left subclavian pacemaker scar Head: no evidence of trauma, PERRL, EOMI, no exophtalmos or lid lag, no myxedema, no xanthelasma; normal ears, nose and oropharynx Neck: normal jugular venous pulsations and no hepatojugular reflux; brisk carotid pulses without delay and no carotid bruits Chest: clear to auscultation, no signs of consolidation by percussion or palpation, normal fremitus, symmetrical and full respiratory excursions Cardiovascular: normal position and quality of the apical impulse, regular rhythm, normal first and second heart sounds, no murmurs, rubs or gallops Abdomen: no tenderness or distention, no masses by palpation, no abnormal pulsatility or arterial bruits, normal bowel sounds, no hepatosplenomegaly Extremities: no clubbing, cyanosis or edema; 2+ radial, ulnar and brachial pulses bilaterally; 2+ right femoral, posterior tibial and dorsalis pedis pulses; 2+ left femoral, posterior tibial and dorsalis pedis pulses; no subclavian or femoral bruits Neurological: grossly nonfocal Psych: Normal mood and affect  Wt Readings from Last 3 Encounters:  11/26/20 262 lb (118.8 kg)  10/01/20 261 lb 6.4 oz (118.6 kg)  08/06/20 258 lb 6.4 oz (117.2 kg)      Studies/Labs Reviewed:   EKG:  EKG is not ordered today.  The intra-cardiac electrogram shows atrial paced, ventricular sensed rhythm  ASSESSMENT:    1. SSS (sick sinus syndrome) ([place])   2. Essential hypertension      PLAN:  In order of problems listed above:  8.  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  Compliant  with anticoagulation with Eliquis.  CHA2DS2-VASc 4 (age 28, CHF, hypertension).   No history of embolic events. 9. SSS: Heart rate histogram appears quite blunted.  Minute ventilation sensor increased today.  Reevaluate in a couple of months. 10. HTN: Blood pressure fairly low today.  We will stop his diltiazem permanently. 11. Obesity: Strongly encouraged renewed efforts at weight loss, since the behavior he is the more likely is to have recurrent atrial fibrillation 12. PM: Generator change roughly 1 month ago, had some problems with a stitch at the medial corner of the wound, but this has now healed very nicely.  Remote downloads every 3 months.  His current pacing system is MRI conditional. 13. CHF: He does not have signs or symptoms of hypervolemia and does not have a lot of issues with exertional dyspnea.  His echocardiogram shows slight reduction in LVEF to 45-50%, and with his new device we have been able to avoid the high burden of ventricular pacing that he had with the old system.  We will recheck an echocardiogram after several months to see if reduction in V pacing is accompanied by improved LV ejection fraction. 14. OSA: He reports 100% compliance with CPAP and he denies daytime hypersomnolence.  We were unable to estimate PA pressure on his last echo.  Have not yet been able to get him back in the sleep clinic with Dr. Claiborne Billings.     Medication Adjustments/Labs and Tests Ordered: Current medicines are reviewed at length with the patient today.  Concerns regarding medicines are outlined above.  Medication changes, Labs and Tests ordered today are listed in the Patient Instructions below. Patient Instructions  Medication Instructions:  No changes *If you need a refill on your cardiac medications before your next appointment, please call your pharmacy*   Follow-Up: At [facility], you and your health needs are our priority.  As part of our continuing mission to provide you with  exceptional heart care, we have created designated Provider Care Teams.  These Care Teams include your primary Cardiologist (physician) and Advanced Practice Providers (APPs -  Physician Assistants and Nurse Practitioners) who all work together to provide you with the care you need, when you need it.  We recommend signing up for the patient portal called "MyChart".  Sign up information is provided on this After Visit Summary.  MyChart is used to connect with patients for Virtual Visits (Telemedicine).  Patients are able to view lab/test results, encounter notes, upcoming appointments, etc.  Non-urgent messages can be sent to your provider as well.   To learn more about what you can do with MyChart, go to [email or web address].    Your next appointment:   Follow up on 4/25 at 8 am with Dr. Sallyanne Kuster  Other Instructions  You are scheduled for a Cardioversion on 12/26/20 with Dr. Sallyanne Kuster.  Please arrive at the [facility] (Main  Entrance A) at [facility]: [place], [place] [address] at 9 am. (1 hour prior to procedure)  DIET: Nothing to eat or drink after midnight except a sip of water with medications (see medication instructions below)  Medication Instructions: Hold furosemide the morning of the procedure.  Continue your anticoagulant: Eliquis You will need to continue your anticoagulant after your procedure until you  are told by your provider that it is safe to stop   Labs:  Your provider would like for you to return on 12/24/20 to have the following labs drawn: CBC and BMET. You do not need an appointment for the lab. Once in our office lobby there is a podium where you can sign in and ring the doorbell to alert [place] that you are here. The lab is open from 8:00 am to 4:30 pm; closed for lunch from 12:45pm-1:45pm.  You will need to have the coronavirus test completed prior to your procedure. An appointment has been made at 8:50 am on 12/24/20. This is a Drive Up Visit at  [address], [place], [place] [address]. Please tell them that you are there for procedure testing. Stay in your car and someone will be with you shortly. Please make sure to have all other labs completed before this test because you will need to stay quarantined until your procedure.  You must have a responsible person to drive you home and stay in the waiting area during your procedure. Failure to do so could result in cancellation.  Bring your insurance cards.  *Special Note: Every effort is made to have your procedure done on time. Occasionally there are emergencies that occur at the hospital that may cause delays. Please be patient if a delay does occur.         Signed, Sanda Klein, MD  11/26/2020 2:29 PM    [address], [place], [place]  [address] Phone: [phone]; Fax: [phone]

## 2020-11-26 NOTE — Patient Instructions (Signed)
Medication Instructions:  No changes *If you need a refill on your cardiac medications before your next appointment, please call your pharmacy*   Follow-Up: At [facility], you and your health needs are our priority.  As part of our continuing mission to provide you with exceptional heart care, we have created designated Provider Care Teams.  These Care Teams include your primary Cardiologist (physician) and Advanced Practice Providers (APPs -  Physician Assistants and Nurse Practitioners) who all work together to provide you with the care you need, when you need it.  We recommend signing up for the patient portal called "MyChart".  Sign up information is provided on this After Visit Summary.  MyChart is used to connect with patients for Virtual Visits (Telemedicine).  Patients are able to view lab/test results, encounter notes, upcoming appointments, etc.  Non-urgent messages can be sent to your provider as well.   To learn more about what you can do with MyChart, go to [email or web address].    Your next appointment:   Follow up on 4/25 at 8 am with Dr. Sallyanne Kuster  Other Instructions  You are scheduled for a Cardioversion on 12/26/20 with Dr. Sallyanne Kuster.  Please arrive at the [facility] (Main Entrance A) at [facility]: [address] [place], [place] [address] at 9 am. (1 hour prior to procedure)  DIET: Nothing to eat or drink after midnight except a sip of water with medications (see medication instructions below)  Medication Instructions: Hold furosemide the morning of the procedure.  Continue your anticoagulant: Eliquis You will need to continue your anticoagulant after your procedure until you  are told by your provider that it is safe to stop   Labs:  Your provider would like for you to return on 12/24/20 to have the following labs drawn: CBC and BMET. You do not need an appointment for the lab. Once in our office lobby there is a podium where you can sign in and ring the  doorbell to alert [place] that you are here. The lab is open from 8:00 am to 4:30 pm; closed for lunch from 12:45pm-1:45pm.  You will need to have the coronavirus test completed prior to your procedure. An appointment has been made at 8:50 am on 12/24/20. This is a Drive Up Visit at [address], [place], [place] [address]. Please tell them that you are there for procedure testing. Stay in your car and someone will be with you shortly. Please make sure to have all other labs completed before this test because you will need to stay quarantined until your procedure.  You must have a responsible person to drive you home and stay in the waiting area during your procedure. Failure to do so could result in cancellation.  Bring your insurance cards.  *Special Note: Every effort is made to have your procedure done on time. Occasionally there are emergencies that occur at the hospital that may cause delays. Please be patient if a delay does occur.

## 2020-11-27 ENCOUNTER — Other Ambulatory Visit: Payer: Self-pay | Admitting: Cardiovascular Disease

## 2020-11-28 ENCOUNTER — Other Ambulatory Visit: Payer: Self-pay

## 2020-12-06 ENCOUNTER — Ambulatory Visit (INDEPENDENT_AMBULATORY_CARE_PROVIDER_SITE_OTHER): Payer: BC Managed Care – PPO

## 2020-12-06 DIAGNOSIS — I495 Sick sinus syndrome: Secondary | ICD-10-CM | POA: Diagnosis not present

## 2020-12-07 ENCOUNTER — Other Ambulatory Visit: Payer: Self-pay | Admitting: Physician Assistant

## 2020-12-07 LAB — CUP PACEART REMOTE DEVICE CHECK
Date Time Interrogation Session: 20220407170321
Implantable Lead Implant Date: 20041025
Implantable Lead Implant Date: 20041025
Implantable Lead Location: 753859
Implantable Lead Location: 753860
Implantable Lead Model: 4457
Implantable Lead Model: 4480
Implantable Lead Serial Number: 338209
Implantable Lead Serial Number: 424134
Implantable Pulse Generator Implant Date: 20211206
Pulse Gen Serial Number: 955727

## 2020-12-19 NOTE — Progress Notes (Signed)
Remote pacemaker transmission.   

## 2020-12-24 ENCOUNTER — Ambulatory Visit (INDEPENDENT_AMBULATORY_CARE_PROVIDER_SITE_OTHER): Payer: BC Managed Care – PPO | Admitting: Cardiovascular Disease

## 2020-12-24 ENCOUNTER — Encounter: Payer: Self-pay | Admitting: Cardiovascular Disease

## 2020-12-24 ENCOUNTER — Other Ambulatory Visit: Payer: Self-pay

## 2020-12-24 ENCOUNTER — Other Ambulatory Visit (HOSPITAL_COMMUNITY)
Admission: RE | Admit: 2020-12-24 | Discharge: 2020-12-24 | Disposition: A | Discharge status: Home / Self Care | Payer: BC Managed Care – PPO | Source: Ambulatory Visit | Attending: Cardiovascular Disease | Admitting: Cardiovascular Disease

## 2020-12-24 ENCOUNTER — Other Ambulatory Visit (HOSPITAL_COMMUNITY): Payer: BC Managed Care – PPO

## 2020-12-24 VITALS — BP 106/60 | HR 70 | Ht 71.0 in | Wt 262.2 lb

## 2020-12-24 DIAGNOSIS — I110 Hypertensive heart disease with heart failure: Secondary | ICD-10-CM | POA: Diagnosis not present

## 2020-12-24 DIAGNOSIS — Z8249 Family history of ischemic heart disease and other diseases of the circulatory system: Secondary | ICD-10-CM | POA: Diagnosis not present

## 2020-12-24 DIAGNOSIS — Z20822 Contact with and (suspected) exposure to covid-19: Secondary | ICD-10-CM | POA: Insufficient documentation

## 2020-12-24 DIAGNOSIS — G4733 Obstructive sleep apnea (adult) (pediatric): Secondary | ICD-10-CM

## 2020-12-24 DIAGNOSIS — Z87891 Personal history of nicotine dependence: Secondary | ICD-10-CM | POA: Diagnosis not present

## 2020-12-24 DIAGNOSIS — Z79811 Long term (current) use of aromatase inhibitors: Secondary | ICD-10-CM | POA: Diagnosis not present

## 2020-12-24 DIAGNOSIS — Z6836 Body mass index (BMI) 36.0-36.9, adult: Secondary | ICD-10-CM | POA: Diagnosis not present

## 2020-12-24 DIAGNOSIS — Z01812 Encounter for preprocedural laboratory examination: Secondary | ICD-10-CM | POA: Insufficient documentation

## 2020-12-24 DIAGNOSIS — Z79899 Other long term (current) drug therapy: Secondary | ICD-10-CM | POA: Diagnosis not present

## 2020-12-24 DIAGNOSIS — Z7901 Long term (current) use of anticoagulants: Secondary | ICD-10-CM | POA: Diagnosis not present

## 2020-12-24 DIAGNOSIS — I10 Essential (primary) hypertension: Secondary | ICD-10-CM

## 2020-12-24 DIAGNOSIS — I495 Sick sinus syndrome: Secondary | ICD-10-CM | POA: Diagnosis not present

## 2020-12-24 DIAGNOSIS — I484 Atypical atrial flutter: Secondary | ICD-10-CM | POA: Diagnosis not present

## 2020-12-24 DIAGNOSIS — I4819 Other persistent atrial fibrillation: Secondary | ICD-10-CM | POA: Diagnosis present

## 2020-12-24 DIAGNOSIS — Z6837 Body mass index (BMI) 37.0-37.9, adult: Secondary | ICD-10-CM

## 2020-12-24 DIAGNOSIS — Z950 Presence of cardiac pacemaker: Secondary | ICD-10-CM | POA: Diagnosis not present

## 2020-12-24 DIAGNOSIS — I5032 Chronic diastolic (congestive) heart failure: Secondary | ICD-10-CM

## 2020-12-24 LAB — BASIC METABOLIC PANEL
BUN/Creatinine Ratio: 17 (ref 10–24)
BUN: 17 mg/dL (ref 8–27)
CO2: 26 mmol/L (ref 20–29)
Calcium: 9.3 mg/dL (ref 8.6–10.2)
Chloride: 101 mmol/L (ref 96–106)
Creatinine, Ser: 1.01 mg/dL (ref 0.76–1.27)
Glucose: 91 mg/dL (ref 65–99)
Potassium: 4.2 mmol/L (ref 3.5–5.2)
Sodium: 141 mmol/L (ref 134–144)
eGFR: 77 mL/min/{1.73_m2} (ref >59)

## 2020-12-24 LAB — CBC
Hematocrit: 41.4 % (ref 37.5–51.0)
Hemoglobin: 14.2 g/dL (ref 13.0–17.7)
MCH: 32.3 pg (ref 26.6–33.0)
MCHC: 34.3 g/dL (ref 31.5–35.7)
MCV: 94 fL (ref 79–97)
Platelets: 221 10*3/uL (ref 150–450)
RBC: 4.4 x10E6/uL (ref 4.14–5.80)
RDW: 12.5 % (ref 11.6–15.4)
WBC: 4.8 10*3/uL (ref 3.4–10.8)

## 2020-12-24 NOTE — Patient Instructions (Signed)
Medication Instructions:  No changes *If you need a refill on your cardiac medications before your next appointment, please call your pharmacy*   Follow-Up: At [facility], you and your health needs are our priority.  As part of our continuing mission to provide you with exceptional heart care, we have created designated Provider Care Teams.  These Care Teams include your primary Cardiologist (physician) and Advanced Practice Providers (APPs -  Physician Assistants and Nurse Practitioners) who all work together to provide you with the care you need, when you need it.  We recommend signing up for the patient portal called "MyChart".  Sign up information is provided on this After Visit Summary.  MyChart is used to connect with patients for Virtual Visits (Telemedicine).  Patients are able to view lab/test results, encounter notes, upcoming appointments, etc.  Non-urgent messages can be sent to your provider as well.   To learn more about what you can do with MyChart, go to [email or web address].    Your next appointment:   Follow up in 2-3 weeks with Dr. Sallyanne Kuster    You are scheduled for a  Cardioversion on 12/26/20 with Dr. Sallyanne Kuster.  Please arrive at the [facility] (Main Entrance A) at [facility]: [address] [place], [place] [address] at 9 am. (1 hour prior to procedure)  DIET: Nothing to eat or drink after midnight except a sip of water with medications (see medication instructions below)  Medication Instructions: Hold the furosemide  Continue your anticoagulant: Eliquis You will need to continue your anticoagulant after your procedure until you  are told by your provider that it is safe to stop   Labs:   Labs today  You will need to have the coronavirus test completed prior to your procedure. An appointment has been made at 9:40 on 12/24/20. This is a Drive Up Visit at [address], [place],  [address]. Please tell them that you are there for procedure  testing. Stay in your car and someone will be with you shortly. Please make sure to have all other labs completed before this test because you will need to stay quarantined until your procedure.  You must have a responsible person to drive you home and stay in the waiting area during your procedure. Failure to do so could result in cancellation.  Bring your insurance cards.  *Special Note: Every effort is made to have your procedure done on time. Occasionally there are emergencies that occur at the hospital that may cause delays. Please be patient if a delay does occur.

## 2020-12-24 NOTE — H&P (View-Only) (Signed)
Patient ID: Shane [name], male   DOB: 18-Jun-1946, 75 y.o.   MRN: [id number]     Cardiology Office Note    Date:  12/26/2020   ID:  Het, Helferich 03/06/1946, MRN [id number]  PCP:  Albina Billet, MD  Cardiologist:   Sanda Klein, MD   No chief complaint on file.   History of Present Illness:  Shane [name] is a 75 y.o. male who presents for recurrent persistent atrial fibrillation.  He has a history of tachycardia-bradycardia syndrome, chronic diastolic heart failure (last echo April 2021 shows mildly depressed LVEF 45-50%), morbid obesity, obstructive sleep apnea on CPAP.  He underwent radiofrequency ablation for atrial fibrillation in August 2018 with a recurrent atrial fibrillation requiring cardioversion in February 2021 and June 2021, then started on amiodarone and underwent successful cardioversion with lasting benefit on April 11, 2020.  He underwent pacemaker generator change out last December ([place], August 06, 2020).  The site has healed well.  He felt quite well until November 20, 2020 when he began developing chest congestion and he has reduced exercise capacity since.  Interrogation of his pacemaker showed that his symptom onset coincided with onset of persistent atrial fibrillation.  He has been in uninterrupted atrial fibrillation for the last month, with good rate control.  He is compliant with Eliquis and has not had falls, injuries or bleeding problems.  He complains of orthostatic dizziness if he bends over doing yard work, but otherwise denies dizziness, palpitations, syncope, exertional dyspnea (he complains more fatigued than shortness of breath), angina, focal neurological complaints or lower extremity edema.  The atrial arrhythmia seems to be organized with a cycle length that today was around 320 ms.  When he first presented a month ago with these complaints, we attempted overdrive pacing unsuccessfully.  We tried it again today using  decreasing cycle lengths from 290 ms, all the way down to 190 ms.  Just like a month ago, atrial pacing did engage the arrhythmia cycle, but this would stop for a beat or 2 before restarting, at higher cycle length deteriorating to atrial fibrillation, but then reorganizing to about 300 ms after a few minutes.  He reports compliance with CPAP.  He denies daytime hypersomnolence.  Past Medical History:  Diagnosis Date  . Atrioventricular block   . Degenerative joint disease   . Gout   . Paroxysmal atrial fibrillation (HCC)   . Presence of permanent cardiac pacemaker 03/12/10   guidant  ([place])  . Sinus node dysfunction (HCC)   . Sleep apnea    CPAP  . Wears hearing aid in both ears     Past Surgical History:  Procedure Laterality Date  . ATRIAL FIBRILLATION ABLATION N/A 04/03/2017   Procedure: Atrial Fibrillation Ablation;  Surgeon: Constance Haw, MD;  Location: [place] [facility];  Service: Cardiovascular;  Laterality: N/A;  . CARDIAC CATHETERIZATION  10/13/1994   No CAD  . CARDIAC CATHETERIZATION  05/10/2003   No CAD  . CARDIOVERSION N/A 10/21/2016   Procedure: CARDIOVERSION;  Surgeon: Sanda Klein, MD;  Location: [facility] ENDOSCOPY;  Service: Cardiovascular;  Laterality: N/A;  . CARDIOVERSION N/A 05/15/2017   Procedure: CARDIOVERSION;  Surgeon: Fay Records, MD;  Location: [facility] END[facility]COPY;  Service: Cardiovascular;  Laterality: N/A;  . CARDIOVERSION N/A 10/21/2019   Procedure: CARDIOVERSION;  Surgeon: Sanda Klein, MD;  Location: [place] ENDOSCOPY;  Service: Cardiovascular;  Laterality: N/A;  . CARDIOVERSION N/A 02/24/2020   Procedure: CARDIOVERSION;  Surgeon:  Lelon Perla, MD;  Location: [facility] ENDOSCOPY;  Service: Cardiovascular;  Laterality: N/A;  . CARDIOVERSION N/A 04/11/2020   Procedure: CARDIOVERSION;  Surgeon: Skeet Latch, MD;  Location: [place];  Service: Cardiovascular;  Laterality: N/A;  . CATARACT EXTRACTION W/PHACO Left 05/18/2019   Procedure:  CATARACT EXTRACTION PHACO AND INTRAOCULAR LENS PLACEMENT (IOC) LEFT  1:21 11.0% 9.01;  Surgeon: Leandrew Koyanagi, MD;  Location: [place];  Service: Ophthalmology;  Laterality: Left;  sleep apnea  . CATARACT EXTRACTION W/PHACO Right 06/29/2019   Procedure: CATARACT EXTRACTION PHACO AND INTRAOCULAR LENS PLACEMENT (IOC) RIGHT  01:14.2  14.5%  10.92;  Surgeon: Leandrew Koyanagi, MD;  Location: [place];  Service: Ophthalmology;  Laterality: Right;  sleep apnea  . CYST EXCISION  03/2019   on back   . NM MYOCAR PERF WALL MOTION  05/07/2011   Lexiscan: No ishcemia  . PERMANENT PACEMAKER INSERTION  03/12/2010   guidant  . PPM GENERATOR CHANGEOUT N/A 08/06/2020   Procedure: PPM GENERATOR CHANGEOUT;  Surgeon: Sanda Klein, MD;  Location: [place] [facility];  Service: Cardiovascular;  Laterality: N/A;  . ROTATOR CUFF REPAIR  1996  . SHOULDER ARTHROSCOPY    . US ECHOCARDIOGRAPHY  05/07/2011   mod. LVH,LA mod. dilated,borderline aortic root dilatation    Outpatient Medications Prior to Visit  Medication Sig Dispense Refill  . acetaminophen (TYLENOL) 650 MG CR tablet Take 1,300 mg by mouth every 8 (eight) hours as needed for pain.    Marland Kitchen allopurinol (ZYLOPRIM) 300 MG tablet Take 1 tablet (300 mg total) by mouth daily. 30 tablet 2  . amiodarone (PACERONE) 200 MG tablet Take 1 tablet (200 mg total) by mouth daily. 30 tablet 6  . anastrozole (ARIMIDEX) 1 MG tablet Take 1 mg by mouth at bedtime.     . ARTIFICIAL TEAR SOLUTION OP Place 1 drop into both eyes daily as needed (dry eyes).    . Ascorbic Acid (VITAMIN C) 1000 MG tablet Take 1,000 mg by mouth daily.    . Cholecalciferol (VITAMIN D-3) 1000 UNITS CAPS Take 1,000 Units by mouth at bedtime.     . citalopram (CELEXA) 40 MG tablet Take 1 tablet (40 mg total) by mouth daily. MUST RECEIVE FUTURE REFILLS FROM PCP. (Patient taking differently: Take 40 mg by mouth at bedtime.) 30 tablet 0  . Co-Enzyme Q-10 100 MG CAPS Take 400 mg by  mouth daily.     . colchicine 0.6 MG tablet Take 0.5 tablets (0.3 mg total) by mouth daily as needed. (Patient taking differently: Take 0.3 mg by mouth daily as needed (gout).) 30 tablet 2  . diltiazem (CARDIZEM CD) 120 MG 24 hr capsule Take 1 capsule (120 mg total) by mouth daily. 90 capsule 1  . doxycycline (VIBRAMYCIN) 100 MG capsule Take 100 mg by mouth 2 (two) times daily.    . fluticasone (FLONASE) 50 MCG/ACT nasal spray Place into both nostrils.    . furosemide (LASIX) 40 MG tablet Take 1.5 tablets (60 mg total) by mouth daily. 45 tablet 11  . montelukast (SINGULAIR) 10 MG tablet Take 10 mg by mouth daily.    . Multiple Vitamin (MULTIVITAMIN WITH MINERALS) TABS tablet Take 1 tablet by mouth daily.    . Omega-3 Fatty Acids (FISH OIL) 1200 MG CAPS Take 1,200 mg by mouth 2 (two) times daily.     Marland Kitchen OVER THE COUNTER MEDICATION Take 100 mg by mouth daily. CBD oil    . Phenylephrine-APAP-guaiFENesin (MUCINEX SINUS-MAX CONG & PAIN PO) Take  2 tablets by mouth daily as needed (sinus headaches).     . potassium chloride SA (KLOR-CON) 20 MEQ tablet Take 1 tablet (20 mEq total) by mouth daily. 90 tablet 3  . psyllium (HYDROCIL/METAMUCIL) 95 % PACK Take 1 packet by mouth at bedtime.    . pyridOXINE (VITAMIN B-6) 100 MG tablet Take 100 mg by mouth daily.    . sodium chloride (OCEAN) 0.65 % SOLN nasal spray Place 1 spray into both nostrils as needed for congestion.    . tamsulosin (FLOMAX) 0.4 MG CAPS capsule Take 1 capsule (0.4 mg total) by mouth daily. 30 capsule 11  . Testosterone 1.62 % GEL Apply 3 Pump topically daily. Shoulders  1  . vitamin B-12 (CYANOCOBALAMIN) 1000 MCG tablet Take 1,000 mcg by mouth daily.    Marland Kitchen zinc gluconate 50 MG tablet Take 50 mg by mouth daily.    Marland Kitchen apixaban (ELIQUIS) 5 MG TABS tablet Take 1 tablet (5 mg total) by mouth 2 (two) times daily. 60 tablet 4   No facility-administered medications prior to visit.     Allergies:   Patient has no known allergies.   Social  History   Socioeconomic History  . Marital status: Married    Spouse name: Not on file  . Number of children: Not on file  . Years of education: Not on file  . Highest education level: Not on file  Occupational History  . Not on file  Tobacco Use  . Smoking status: Former Smoker    Types: Cigars    Quit date: 05/11/1993    Years since quitting: 27.6  . Smokeless tobacco: Former Systems developer    Types: Chew    Quit date: 05/11/1993  Vaping Use  . Vaping Use: Never used  Substance and Sexual Activity  . Alcohol use: Not Currently    Comment: social  . Drug use: No  . Sexual activity: Not on file  Other Topics Concern  . Not on file  Social History Narrative  . Not on file   Social Determinants of Health   Financial Resource Strain: Not on file  Food Insecurity: Not on file  Transportation Needs: Not on file  Physical Activity: Not on file  Stress: Not on file  Social Connections: Not on file     Family History:  The patient's family history includes Alzheimer's disease in his father; Dementia in his sister; Heart disease in his sister; Heart failure in his mother; Stroke in his mother.   ROS:   Please see the history of present illness.   All other systems are reviewed and are negative.   PHYSICAL EXAM:   VS:  BP 106/60   Pulse 70   Ht 5\' 11"  (1.803 m)   Wt 262 lb 3.2 oz (118.9 kg)   SpO2 96%   BMI 36.57 kg/m      General: Alert, oriented x3, no distress, severely obese. Head: no evidence of trauma, PERRL, EOMI, no exophtalmos or lid lag, no myxedema, no xanthelasma; normal ears, nose and oropharynx Neck: normal jugular venous pulsations and no hepatojugular reflux; brisk carotid pulses without delay and no carotid bruits Chest: clear to auscultation, no signs of consolidation by percussion or palpation, normal fremitus, symmetrical and full respiratory excursions Cardiovascular: normal position and quality of the apical impulse, regular rhythm, normal first and second  heart sounds, no murmurs, rubs or gallops Abdomen: no tenderness or distention, no masses by palpation, no abnormal pulsatility or arterial bruits, normal bowel sounds, no  hepatosplenomegaly Extremities: no clubbing, cyanosis or edema; 2+ radial, ulnar and brachial pulses bilaterally; 2+ right femoral, posterior tibial and dorsalis pedis pulses; 2+ left femoral, posterior tibial and dorsalis pedis pulses; no subclavian or femoral bruits Neurological: grossly nonfocal Psych: Normal mood and affect  Wt Readings from Last 3 Encounters:  12/24/20 262 lb 3.2 oz (118.9 kg)  11/26/20 262 lb (118.8 kg)  10/01/20 261 lb 6.4 oz (118.6 kg)      Studies/Labs Reviewed:   EKG:  EKG is performed today shows atrial flutter with 100% ventricular paced rhythm  ASSESSMENT:    1. Atypical atrial flutter ([place])   2. SSS (sick sinus syndrome) ([place])   3. Essential hypertension   4. Class 2 severe obesity due to excess calories with serious comorbidity and body mass index (BMI) of 37.0 to 37.9 in adult ([place])   5. Presence of cardiac pacemaker   6. Chronic diastolic CHF (congestive heart failure) ([place])   7. OSA (obstructive sleep apnea)      PLAN:  In order of problems listed above:  1. AFib s/p RFA 2018/atypical atrial flutter: Successfully maintained normal rhythm following cardioversion in August, while amiodarone, until the current episode that began on March 22.  Seems to be an atypical atrial flutter, overdrive pacing only briefly interrupts the arrhythmia suggesting it might be a left atrial reentry circuit.  Compliant with anticoagulation with Eliquis.  CHA2DS2-VASc 4 (age 65, CHF, hypertension).   No history of embolic events.  He appears to be reasonably well compensated, without overt hypervolemia/heart failure or weight change.  His overall exercise capacity has diminished however..  Scheduled for cardioversion this Wednesday, April 27 with option for referral for "touchup" radiofrequency ablation  with Dr. Curt Bears if he again has recurrent arrhythmia. 2. SSS: Heart rate histogram appears quite blunted.  Increase the contribution of the minute ventilation sensor a month ago, will defer further adjustments for another couple of months. 3. HTN: Well-controlled, has some symptoms of orthostatic hypotension likely due to the concomitant use of diltiazem, furosemide as well as tamsulosin and citalopram. 4. Obesity: We have discussed the relationship between weight and the burden of atrial fibrillation. 5. PM: With the ability to program longer AV delays, the burden of ventricular pacing is lower, but he still has 61% ventricular pacing.  He has received his home monitor and we will start doing remote downloads. 6. CHF: Overt evidence of hypervolemia by physical exam.  His echocardiogram shows slight reduction in LVEF to 45-50%.  We will plan to recheck this once we get him back in normal rhythm. 7. OSA: He reports 100% compliance with CPAP and he denies daytime hypersomnolence.  We were unable to estimate PA pressure on his last echo.  I reinforced the need for compliance with CPAP.     Medication Adjustments/Labs and Tests Ordered: Current medicines are reviewed at length with the patient today.  Concerns regarding medicines are outlined above.  Medication changes, Labs and Tests ordered today are listed in the Patient Instructions below. Patient Instructions  Medication Instructions:  No changes *If you need a refill on your cardiac medications before your next appointment, please call your pharmacy*   Follow-Up: At [facility], you and your health needs are our priority.  As part of our continuing mission to provide you with exceptional heart care, we have created designated Provider Care Teams.  These Care Teams include your primary Cardiologist (physician) and Advanced Practice Providers (APPs -  Physician Assistants and Nurse Practitioners) who  all work together to provide you with the  care you need, when you need it.  We recommend signing up for the patient portal called "MyChart".  Sign up information is provided on this After Visit Summary.  MyChart is used to connect with patients for Virtual Visits (Telemedicine).  Patients are able to view lab/test results, encounter notes, upcoming appointments, etc.  Non-urgent messages can be sent to your provider as well.   To learn more about what you can do with MyChart, go to [email or web address].    Your next appointment:   Follow up in 2-3 weeks with Dr. Sallyanne Kuster    You are scheduled for a  Cardioversion on 12/26/20 with Dr. Sallyanne Kuster.  Please arrive at the [facility] (Main Entrance A) at [facility]: [address] [place], [place] [address] at 9 am. (1 hour prior to procedure)  DIET: Nothing to eat or drink after midnight except a sip of water with medications (see medication instructions below)  Medication Instructions: Hold the furosemide  Continue your anticoagulant: Eliquis You will need to continue your anticoagulant after your procedure until you  are told by your provider that it is safe to stop   Labs:   Labs today  You will need to have the coronavirus test completed prior to your procedure. An appointment has been made at 9:40 on 12/24/20. This is a Drive Up Visit at [id number], [place], [place] [address]. Please tell them that you are there for procedure testing. Stay in your car and someone will be with you shortly. Please make sure to have all other labs completed before this test because you will need to stay quarantined until your procedure.  You must have a responsible person to drive you home and stay in the waiting area during your procedure. Failure to do so could result in cancellation.  Bring your insurance cards.  *Special Note: Every effort is made to have your procedure done on time. Occasionally there are emergencies that occur at the hospital that may cause delays. Please be  patient if a delay does occur.          Signed, Sanda Klein, MD  12/26/2020 8:43 AM    [facility] [place], [place], [place]  [address] Phone: [phone]; Fax: [phone]    Patient ID: JATNIEL KOKER, male   DOB: 1946/01/01, 75 y.o.   MRN: [id number]     Cardiology Office Note    Date:  12/26/2020   ID:  Nikolos, Corkill 20-Apr-1946, MRN [id number]  PCP:  Albina Billet, MD  Cardiologist:   Sanda Klein, MD   No chief complaint on file.   History of Present Illness:  Shane [name] is a 75 y.o. male who presents for recurrent persistent atrial fibrillation and pacemaker check.  He has a history of tachycardia-bradycardia syndrome, chronic diastolic heart failure (last echo April 2021 shows mildly depressed LVEF 45-50%), morbid obesity, obstructive sleep apnea on CPAP.  He underwent radiofrequency ablation for atrial fibrillation in August 2018 with a recurrent atrial fibrillation requiring cardioversion in February 2021 and June 2021, then started on amiodarone and underwent successful cardioversion with lasting benefit on April 11, 2020.  He underwent pacemaker generator change out last month [facility], August 06, 2020).  A couple of weeks later he had a small red the draining spot at the medial corner of the surgical site and Dr. Already removed a stitch from that  area and gave him a short course of antibiotics.  Subsequently the wound has healed very nicely.  There is no evidence of redness, tenderness or any additional drainage.  The scar has sealed over.  He has not had any fever or chills.  Interrogation of his pacemaker today shows that he has not had any atrial fibrillation since device change out.  He has 100% atrial pacing and does not have any underlying atrial activity, although he has normal AV conduction.  He only has 12% ventricular pacing.  His heart rate histogram is extremely blunted and I increased the  contribution of his minute ventilation sensor today.  He has had some fatigue problems.  He reports compliance with CPAP.  He denies daytime hypersomnolence.  He has not had problems with shortness of breath or chest discomfort.  His borderline low blood pressure today concerns him, but he has not had dizziness lightheadedness or syncope.  He denies any focal neurological events.  He has not had any bleeding problems on Eliquis.  Past Medical History:  Diagnosis Date  . Atrioventricular block   . Degenerative joint disease   . Gout   . Paroxysmal atrial fibrillation (HCC)   . Presence of permanent cardiac pacemaker 03/12/10   guidant  ([place])  . Sinus node dysfunction (HCC)   . Sleep apnea    CPAP  . Wears hearing aid in both ears     Past Surgical History:  Procedure Laterality Date  . ATRIAL FIBRILLATION ABLATION N/A 04/03/2017   Procedure: Atrial Fibrillation Ablation;  Surgeon: Constance Haw, MD;  Location: [place] CV LAB;  Service: Cardiovascular;  Laterality: N/A;  . CARDIAC CATHETERIZATION  10/13/1994   No CAD  . CARDIAC CATHETERIZATION  05/10/2003   No CAD  . CARDIOVERSION N/A 10/21/2016   Procedure: CARDIOVERSION;  Surgeon: Sanda Klein, MD;  Location: [place];  Service: Cardiovascular;  Laterality: N/A;  . CARDIOVERSION N/A 05/15/2017   Procedure: CARDIOVERSION;  Surgeon: Fay Records, MD;  Location: [place];  Service: Cardiovascular;  Laterality: N/A;  . CARDIOVERSION N/A 10/21/2019   Procedure: CARDIOVERSION;  Surgeon: Sanda Klein, MD;  Location: [facility] ENDOSCOPY;  Service: Cardiovascular;  Laterality: N/A;  . CARDIOVERSION N/A 02/24/2020   Procedure: CARDIOVERSION;  Surgeon: Lelon Perla, MD;  Location: [facility] ENDOSCOPY;  Service: Cardiovascular;  Laterality: N/A;  . CARDIOVERSION N/A 04/11/2020   Procedure: CARDIOVERSION;  Surgeon: Skeet Latch, MD;  Location: [place];  Service: Cardiovascular;  Laterality: N/A;  . CATARACT  EXTRACTION W/PHACO Left 05/18/2019   Procedure: CATARACT EXTRACTION PHACO AND INTRAOCULAR LENS PLACEMENT ([place]) LEFT  1:21 11.0% 9.01;  Surgeon: Leandrew Koyanagi, MD;  Location: [place];  Service: Ophthalmology;  Laterality: Left;  sleep apnea  . CATARACT EXTRACTION W/PHACO Right 06/29/2019   Procedure: CATARACT EXTRACTION PHACO AND INTRAOCULAR LENS PLACEMENT (I[place]) RIGHT  01:14.2  14.5%  10.92;  Surgeon: Leandrew Koyanagi, MD;  Location: [place];  Service: Ophthalmology;  Laterality: Right;  sleep apnea  . CYST EXCISION  03/2019   on back   . NM MYOCAR PERF WALL MOTION  05/07/2011   Lexiscan: No ishcemia  . PERMANENT PACEMAKER INSERTION  03/12/2010   guidant  . PPM GENERATOR CHANGEOUT N/A 08/06/2020   Procedure: PPM GENERATOR CHANGEOUT;  Surgeon: Sanda Klein, MD;  Location: [place] CV [facility];  Service: Cardiovascular;  Laterality: N/A;  . ROTATOR CUFF REPAIR  1996  . SHOULDER ARTHROSCOPY    . US ECHOCARDIOGRAPHY  05/07/2011  mod. LVH,LA mod. dilated,borderline aortic root dilatation    Outpatient Medications Prior to Visit  Medication Sig Dispense Refill  . acetaminophen (TYLENOL) 650 MG CR tablet Take 1,300 mg by mouth every 8 (eight) hours as needed for pain.    Marland Kitchen allopurinol (ZYLOPRIM) 300 MG tablet Take 1 tablet (300 mg total) by mouth daily. 30 tablet 2  . amiodarone (PACERONE) 200 MG tablet Take 1 tablet (200 mg total) by mouth daily. 30 tablet 6  . anastrozole (ARIMIDEX) 1 MG tablet Take 1 mg by mouth at bedtime.     . ARTIFICIAL TEAR SOLUTION OP Place 1 drop into both eyes daily as needed (dry eyes).    . Ascorbic Acid (VITAMIN C) 1000 MG tablet Take 1,000 mg by mouth daily.    . Cholecalciferol (VITAMIN D-3) 1000 UNITS CAPS Take 1,000 Units by mouth at bedtime.     . citalopram (CELEXA) 40 MG tablet Take 1 tablet (40 mg total) by mouth daily. MUST RECEIVE FUTURE REFILLS FROM PCP. (Patient taking differently: Take 40 mg by mouth at bedtime.) 30 tablet 0   . Co-Enzyme Q-10 100 MG CAPS Take 400 mg by mouth daily.     . colchicine 0.6 MG tablet Take 0.5 tablets (0.3 mg total) by mouth daily as needed. (Patient taking differently: Take 0.3 mg by mouth daily as needed (gout).) 30 tablet 2  . diltiazem (CARDIZEM CD) 120 MG 24 hr capsule Take 1 capsule (120 mg total) by mouth daily. 90 capsule 1  . doxycycline (VIBRAMYCIN) 100 MG capsule Take 100 mg by mouth 2 (two) times daily.    . fluticasone (FLONASE) 50 MCG/ACT nasal spray Place into both nostrils.    . furosemide (LASIX) 40 MG tablet Take 1.5 tablets (60 mg total) by mouth daily. 45 tablet 11  . montelukast (SINGULAIR) 10 MG tablet Take 10 mg by mouth daily.    . Multiple Vitamin (MULTIVITAMIN WITH MINERALS) TABS tablet Take 1 tablet by mouth daily.    . Omega-3 Fatty Acids (FISH OIL) 1200 MG CAPS Take 1,200 mg by mouth 2 (two) times daily.     Marland Kitchen OVER THE COUNTER MEDICATION Take 100 mg by mouth daily. CBD oil    . Phenylephrine-APAP-guaiFENesin (MUCINEX SINUS-MAX CONG & PAIN PO) Take 2 tablets by mouth daily as needed (sinus headaches).     . potassium chloride SA (KLOR-CON) 20 MEQ tablet Take 1 tablet (20 mEq total) by mouth daily. 90 tablet 3  . psyllium (HYDROCIL/METAMUCIL) 95 % PACK Take 1 packet by mouth at bedtime.    . pyridOXINE (VITAMIN B-6) 100 MG tablet Take 100 mg by mouth daily.    . sodium chloride (OCEAN) 0.65 % SOLN nasal spray Place 1 spray into both nostrils as needed for congestion.    . tamsulosin (FLOMAX) 0.4 MG CAPS capsule Take 1 capsule (0.4 mg total) by mouth daily. 30 capsule 11  . Testosterone 1.62 % GEL Apply 3 Pump topically daily. Shoulders  1  . vitamin B-12 (CYANOCOBALAMIN) 1000 MCG tablet Take 1,000 mcg by mouth daily.    Marland Kitchen zinc gluconate 50 MG tablet Take 50 mg by mouth daily.    Marland Kitchen apixaban (ELIQUIS) 5 MG TABS tablet Take 1 tablet (5 mg total) by mouth 2 (two) times daily. 60 tablet 4   No facility-administered medications prior to visit.     Allergies:    Patient has no known allergies.   Social History   Socioeconomic History  . Marital status: Married  Spouse name: Not on file  . Number of children: Not on file  . Years of education: Not on file  . Highest education level: Not on file  Occupational History  . Not on file  Tobacco Use  . Smoking status: Former Smoker    Types: Cigars    Quit date: 05/11/1993    Years since quitting: 27.6  . Smokeless tobacco: Former Systems developer    Types: Chew    Quit date: 05/11/1993  Vaping Use  . Vaping Use: Never used  Substance and Sexual Activity  . Alcohol use: Not Currently    Comment: social  . Drug use: No  . Sexual activity: Not on file  Other Topics Concern  . Not on file  Social History Narrative  . Not on file   Social Determinants of Health   Financial Resource Strain: Not on file  Food Insecurity: Not on file  Transportation Needs: Not on file  Physical Activity: Not on file  Stress: Not on file  Social Connections: Not on file     Family History:  The patient's family history includes Alzheimer's disease in his father; Dementia in his sister; Heart disease in his sister; Heart failure in his mother; Stroke in his mother.   ROS:   Please see the history of present illness.   All other systems are reviewed and are negative.   PHYSICAL EXAM:   VS:  BP 106/60   Pulse 70   Ht 5\' 11"  (1.803 m)   Wt 262 lb 3.2 oz (118.9 kg)   SpO2 96%   BMI 36.57 kg/m      General: Alert, oriented x3, no distress, severely obese, well-healed left subclavian pacemaker scar Head: no evidence of trauma, PERRL, EOMI, no exophtalmos or lid lag, no myxedema, no xanthelasma; normal ears, nose and oropharynx Neck: normal jugular venous pulsations and no hepatojugular reflux; brisk carotid pulses without delay and no carotid bruits Chest: clear to auscultation, no signs of consolidation by percussion or palpation, normal fremitus, symmetrical and full respiratory excursions Cardiovascular:  normal position and quality of the apical impulse, regular rhythm, normal first and second heart sounds, no murmurs, rubs or gallops Abdomen: no tenderness or distention, no masses by palpation, no abnormal pulsatility or arterial bruits, normal bowel sounds, no hepatosplenomegaly Extremities: no clubbing, cyanosis or edema; 2+ radial, ulnar and brachial pulses bilaterally; 2+ right femoral, posterior tibial and dorsalis pedis pulses; 2+ left femoral, posterior tibial and dorsalis pedis pulses; no subclavian or femoral bruits Neurological: grossly nonfocal Psych: Normal mood and affect     Wt Readings from Last 3 Encounters:  12/24/20 262 lb 3.2 oz (118.9 kg)  11/26/20 262 lb (118.8 kg)  10/01/20 261 lb 6.4 oz (118.6 kg)      Studies/Labs Reviewed:   EKG:  EKG is not ordered today.  The intra-cardiac electrogram shows atrial paced, ventricular sensed rhythm  ASSESSMENT:    1. Atypical atrial flutter ([place])   2. SSS (sick sinus syndrome) ([place])   3. Essential hypertension   4. Class 2 severe obesity due to excess calories with serious comorbidity and body mass index (BMI) of 37.0 to 37.9 in adult ([place])   5. Presence of cardiac pacemaker   6. Chronic diastolic CHF (congestive heart failure) ([place])   7. OSA (obstructive sleep apnea)      PLAN:  In order of problems listed above:  8.  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  Compliant with anticoagulation with Eliquis.  CHA2DS2-VASc 4 (age 40, CHF, hypertension).   No history of embolic events. 9. SSS: Heart rate histogram appears quite blunted.  Minute ventilation sensor increased today.  Reevaluate in a couple of months. 10. HTN: Blood pressure fairly low today.  We will stop his diltiazem permanently. 11. Obesity: Strongly encouraged renewed efforts at weight loss, since the behavior he is the more likely is to have recurrent atrial  fibrillation 12. PM: Generator change roughly 1 month ago, had some problems with a stitch at the medial corner of the wound, but this has now healed very nicely.  Remote downloads every 3 months.  His current pacing system is MRI conditional. 13. CHF: He does not have signs or symptoms of hypervolemia and does not have a lot of issues with exertional dyspnea.  His echocardiogram shows slight reduction in LVEF to 45-50%, and with his new device we have been able to avoid the high burden of ventricular pacing that he had with the old system.  We will recheck an echocardiogram after several months to see if reduction in V pacing is accompanied by improved LV ejection fraction. 14. OSA: He reports 100% compliance with CPAP and he denies daytime hypersomnolence.  We were unable to estimate PA pressure on his last echo.  Have not yet been able to get him back in the sleep clinic with Dr. Claiborne Billings.     Medication Adjustments/Labs and Tests Ordered: Current medicines are reviewed at length with the patient today.  Concerns regarding medicines are outlined above.  Medication changes, Labs and Tests ordered today are listed in the Patient Instructions below. Patient Instructions  Medication Instructions:  No changes *If you need a refill on your cardiac medications before your next appointment, please call your pharmacy*   Follow-Up: At [facility], you and your health needs are our priority.  As part of our continuing mission to provide you with exceptional heart care, we have created designated Provider Care Teams.  These Care Teams include your primary Cardiologist (physician) and Advanced Practice Providers (APPs -  Physician Assistants and Nurse Practitioners) who all work together to provide you with the care you need, when you need it.  We recommend signing up for the patient portal called "MyChart".  Sign up information is provided on this After Visit Summary.  MyChart is used to connect with  patients for Virtual Visits (Telemedicine).  Patients are able to view lab/test results, encounter notes, upcoming appointments, etc.  Non-urgent messages can be sent to your provider as well.   To learn more about what you can do with MyChart, go to [email or web address].    Your next appointment:   Follow up in 2-3 weeks with Dr. Sallyanne Kuster    You are scheduled for a  Cardioversion on 12/26/20 with Dr. Sallyanne Kuster.  Please arrive at the [facility] (Main Entrance A) at [facility]: [address], [place] [address] at 9 am. (1 hour prior to procedure)  DIET: Nothing to eat or drink after midnight except a sip of water with medications (see medication instructions below)  Medication Instructions: Hold the furosemide  Continue your anticoagulant: Eliquis You will need to continue your anticoagulant after your procedure until you  are told by your provider that it is safe to stop   Labs:   Labs today  You will need to have the coronavirus test completed prior to your procedure. An appointment has been made at  9:40 on 12/24/20. This is a Drive Up Visit at [id number], [place], [place] [address]. Please tell them that you are there for procedure testing. Stay in your car and someone will be with you shortly. Please make sure to have all other labs completed before this test because you will need to stay quarantined until your procedure.  You must have a responsible person to drive you home and stay in the waiting area during your procedure. Failure to do so could result in cancellation.  Bring your insurance cards.  *Special Note: Every effort is made to have your procedure done on time. Occasionally there are emergencies that occur at the hospital that may cause delays. Please be patient if a delay does occur.          Signed, Sanda Klein, MD  12/26/2020 8:43 AM    [facility] [place], [place],   [address] Phone: [phone]; Fax: [phone]

## 2020-12-24 NOTE — Progress Notes (Signed)
Patient ID: Shane [name], male   DOB: 17-Sep-1945, 75 y.o.   MRN: [id number]     Cardiology Office Note    Date:  12/26/2020   [name], Shane [name] 06-24-1946, MRN [id number]  PCP:  Albina Billet, MD  Cardiologist:   Sanda Klein, MD   No chief complaint on file.   History of Present Illness:  Shane [name] is a 75 y.o. male who presents for recurrent persistent atrial fibrillation.  He has a history of tachycardia-bradycardia syndrome, chronic diastolic heart failure (last echo April 2021 shows mildly depressed LVEF 45-50%), morbid obesity, obstructive sleep apnea on CPAP.  He underwent radiofrequency ablation for atrial fibrillation in August 2018 with a recurrent atrial fibrillation requiring cardioversion in February 2021 and June 2021, then started on amiodarone and underwent successful cardioversion with lasting benefit on April 11, 2020.  He underwent pacemaker generator change out last December ([place], August 06, 2020).  The site has healed well.  He felt quite well until November 20, 2020 when he began developing chest congestion and he has reduced exercise capacity since.  Interrogation of his pacemaker showed that his symptom onset coincided with onset of persistent atrial fibrillation.  He has been in uninterrupted atrial fibrillation for the last month, with good rate control.  He is compliant with Eliquis and has not had falls, injuries or bleeding problems.  He complains of orthostatic dizziness if he bends over doing yard work, but otherwise denies dizziness, palpitations, syncope, exertional dyspnea (he complains more fatigued than shortness of breath), angina, focal neurological complaints or lower extremity edema.  The atrial arrhythmia seems to be organized with a cycle length that today was around 320 ms.  When he first presented a month ago with these complaints, we attempted overdrive pacing unsuccessfully.  We tried it again today using  decreasing cycle lengths from 290 ms, all the way down to 190 ms.  Just like a month ago, atrial pacing did engage the arrhythmia cycle, but this would stop for a beat or 2 before restarting, at higher cycle length deteriorating to atrial fibrillation, but then reorganizing to about 300 ms after a few minutes.  He reports compliance with CPAP.  He denies daytime hypersomnolence.  Past Medical History:  Diagnosis Date  . Atrioventricular block   . Degenerative joint disease   . Gout   . Paroxysmal atrial fibrillation (HCC)   . Presence of permanent cardiac pacemaker 03/12/10   guidant  ([place])  . Sinus node dysfunction (HCC)   . Sleep apnea    CPAP  . Wears hearing aid in both ears     Past Surgical History:  Procedure Laterality Date  . ATRIAL FIBRILLATION ABLATION N/A 04/03/2017   Procedure: Atrial Fibrillation Ablation;  Surgeon: Constance Haw, MD;  Location: [place] [facility];  Service: Cardiovascular;  Laterality: N/A;  . CARDIAC CATHETERIZATION  10/13/1994   No CAD  . CARDIAC CATHETERIZATION  05/10/2003   No CAD  . CARDIOVERSION N/A 10/21/2016   Procedure: CARDIOVERSION;  Surgeon: Sanda Klein, MD;  Location: [facility] ENDOSCOPY;  Service: Cardiovascular;  Laterality: N/A;  . CARDIOVERSION N/A 05/15/2017   Procedure: CARDIOVERSION;  Surgeon: Fay Records, MD;  Location: [facility] ENDOSCOPY;  Service: Cardiovascular;  Laterality: N/A;  . CARDIOVERSION N/A 10/21/2019   Procedure: CARDIOVERSION;  Surgeon: Sanda Klein, MD;  Location: [place] ENDOSCOPY;  Service: Cardiovascular;  Laterality: N/A;  . CARDIOVERSION N/A 02/24/2020   Procedure: CARDIOVERSION;  Surgeon:  Lelon Perla, MD;  Location: [facility] ENDOSCOPY;  Service: Cardiovascular;  Laterality: N/A;  . CARDIOVERSION N/A 04/11/2020   Procedure: CARDIOVERSION;  Surgeon: Skeet Latch, MD;  Location: [place];  Service: Cardiovascular;  Laterality: N/A;  . CATARACT EXTRACTION W/PHACO Left 05/18/2019   Procedure:  CATARACT EXTRACTION PHACO AND INTRAOCULAR LENS PLACEMENT (IOC) LEFT  1:21 11.0% 9.01;  Surgeon: Leandrew Koyanagi, MD;  Location: [place];  Service: Ophthalmology;  Laterality: Left;  sleep apnea  . CATARACT EXTRACTION W/PHACO Right 06/29/2019   Procedure: CATARACT EXTRACTION PHACO AND INTRAOCULAR LENS PLACEMENT (IOC) RIGHT  01:14.2  14.5%  10.92;  Surgeon: Leandrew Koyanagi, MD;  Location: [place];  Service: Ophthalmology;  Laterality: Right;  sleep apnea  . CYST EXCISION  03/2019   on back   . NM MYOCAR PERF WALL MOTION  05/07/2011   Lexiscan: No ishcemia  . PERMANENT PACEMAKER INSERTION  03/12/2010   guidant  . PPM GENERATOR CHANGEOUT N/A 08/06/2020   Procedure: PPM GENERATOR CHANGEOUT;  Surgeon: Sanda Klein, MD;  Location: [place] [facility];  Service: Cardiovascular;  Laterality: N/A;  . ROTATOR CUFF REPAIR  1996  . SHOULDER ARTHROSCOPY    . US ECHOCARDIOGRAPHY  05/07/2011   mod. LVH,LA mod. dilated,borderline aortic root dilatation    Outpatient Medications Prior to Visit  Medication Sig Dispense Refill  . acetaminophen (TYLENOL) 650 MG CR tablet Take 1,300 mg by mouth every 8 (eight) hours as needed for pain.    Marland Kitchen allopurinol (ZYLOPRIM) 300 MG tablet Take 1 tablet (300 mg total) by mouth daily. 30 tablet 2  . amiodarone (PACERONE) 200 MG tablet Take 1 tablet (200 mg total) by mouth daily. 30 tablet 6  . anastrozole (ARIMIDEX) 1 MG tablet Take 1 mg by mouth at bedtime.     . ARTIFICIAL TEAR SOLUTION OP Place 1 drop into both eyes daily as needed (dry eyes).    . Ascorbic Acid (VITAMIN C) 1000 MG tablet Take 1,000 mg by mouth daily.    . Cholecalciferol (VITAMIN D-3) 1000 UNITS CAPS Take 1,000 Units by mouth at bedtime.     . citalopram (CELEXA) 40 MG tablet Take 1 tablet (40 mg total) by mouth daily. MUST RECEIVE FUTURE REFILLS FROM PCP. (Patient taking differently: Take 40 mg by mouth at bedtime.) 30 tablet 0  . Co-Enzyme Q-10 100 MG CAPS Take 400 mg by  mouth daily.     . colchicine 0.6 MG tablet Take 0.5 tablets (0.3 mg total) by mouth daily as needed. (Patient taking differently: Take 0.3 mg by mouth daily as needed (gout).) 30 tablet 2  . diltiazem (CARDIZEM CD) 120 MG 24 hr capsule Take 1 capsule (120 mg total) by mouth daily. 90 capsule 1  . doxycycline (VIBRAMYCIN) 100 MG capsule Take 100 mg by mouth 2 (two) times daily.    . fluticasone (FLONASE) 50 MCG/ACT nasal spray Place into both nostrils.    . furosemide (LASIX) 40 MG tablet Take 1.5 tablets (60 mg total) by mouth daily. 45 tablet 11  . montelukast (SINGULAIR) 10 MG tablet Take 10 mg by mouth daily.    . Multiple Vitamin (MULTIVITAMIN WITH MINERALS) TABS tablet Take 1 tablet by mouth daily.    . Omega-3 Fatty Acids (FISH OIL) 1200 MG CAPS Take 1,200 mg by mouth 2 (two) times daily.     Marland Kitchen OVER THE COUNTER MEDICATION Take 100 mg by mouth daily. CBD oil    . Phenylephrine-APAP-guaiFENesin (MUCINEX SINUS-MAX CONG & PAIN PO) Take  2 tablets by mouth daily as needed (sinus headaches).     . potassium chloride SA (KLOR-CON) 20 MEQ tablet Take 1 tablet (20 mEq total) by mouth daily. 90 tablet 3  . psyllium (HYDROCIL/METAMUCIL) 95 % PACK Take 1 packet by mouth at bedtime.    . pyridOXINE (VITAMIN B-6) 100 MG tablet Take 100 mg by mouth daily.    . sodium chloride (OCEAN) 0.65 % SOLN nasal spray Place 1 spray into both nostrils as needed for congestion.    . tamsulosin (FLOMAX) 0.4 MG CAPS capsule Take 1 capsule (0.4 mg total) by mouth daily. 30 capsule 11  . Testosterone 1.62 % GEL Apply 3 Pump topically daily. Shoulders  1  . vitamin B-12 (CYANOCOBALAMIN) 1000 MCG tablet Take 1,000 mcg by mouth daily.    Marland Kitchen zinc gluconate 50 MG tablet Take 50 mg by mouth daily.    Marland Kitchen apixaban (ELIQUIS) 5 MG TABS tablet Take 1 tablet (5 mg total) by mouth 2 (two) times daily. 60 tablet 4   No facility-administered medications prior to visit.     Allergies:   Patient has no known allergies.   Social  History   Socioeconomic History  . Marital status: Married    Spouse name: Not on file  . Number of children: Not on file  . Years of education: Not on file  . Highest education level: Not on file  Occupational History  . Not on file  Tobacco Use  . Smoking status: Former Smoker    Types: Cigars    Quit date: 05/11/1993    Years since quitting: 27.6  . Smokeless tobacco: Former Systems developer    Types: Chew    Quit date: 05/11/1993  Vaping Use  . Vaping Use: Never used  Substance and Sexual Activity  . Alcohol use: Not Currently    Comment: social  . Drug use: No  . Sexual activity: Not on file  Other Topics Concern  . Not on file  Social History Narrative  . Not on file   Social Determinants of Health   Financial Resource Strain: Not on file  Food Insecurity: Not on file  Transportation Needs: Not on file  Physical Activity: Not on file  Stress: Not on file  Social Connections: Not on file     Family History:  The patient's family history includes Alzheimer's disease in his father; Dementia in his sister; Heart disease in his sister; Heart failure in his mother; Stroke in his mother.   ROS:   Please see the history of present illness.   All other systems are reviewed and are negative.   PHYSICAL EXAM:   VS:  BP 106/60   Pulse 70   Ht 5\' 11"  (1.803 m)   Wt 262 lb 3.2 oz (118.9 kg)   SpO2 96%   BMI 36.57 kg/m      General: Alert, oriented x3, no distress, severely obese. Head: no evidence of trauma, PERRL, EOMI, no exophtalmos or lid lag, no myxedema, no xanthelasma; normal ears, nose and oropharynx Neck: normal jugular venous pulsations and no hepatojugular reflux; brisk carotid pulses without delay and no carotid bruits Chest: clear to auscultation, no signs of consolidation by percussion or palpation, normal fremitus, symmetrical and full respiratory excursions Cardiovascular: normal position and quality of the apical impulse, regular rhythm, normal first and second  heart sounds, no murmurs, rubs or gallops Abdomen: no tenderness or distention, no masses by palpation, no abnormal pulsatility or arterial bruits, normal bowel sounds, no  hepatosplenomegaly Extremities: no clubbing, cyanosis or edema; 2+ radial, ulnar and brachial pulses bilaterally; 2+ right femoral, posterior tibial and dorsalis pedis pulses; 2+ left femoral, posterior tibial and dorsalis pedis pulses; no subclavian or femoral bruits Neurological: grossly nonfocal Psych: Normal mood and affect  Wt Readings from Last 3 Encounters:  12/24/20 262 lb 3.2 oz (118.9 kg)  11/26/20 262 lb (118.8 kg)  10/01/20 261 lb 6.4 oz (118.6 kg)      Studies/Labs Reviewed:   EKG:  EKG is performed today shows atrial flutter with 100% ventricular paced rhythm  ASSESSMENT:    1. Atypical atrial flutter ([place])   2. SSS (sick sinus syndrome) ([place])   3. Essential hypertension   4. Class 2 severe obesity due to excess calories with serious comorbidity and body mass index (BMI) of 37.0 to 37.9 in adult ([place])   5. Presence of cardiac pacemaker   6. Chronic diastolic CHF (congestive heart failure) ([place])   7. OSA (obstructive sleep apnea)      PLAN:  In order of problems listed above:  1. AFib s/p RFA 2018/atypical atrial flutter: Successfully maintained normal rhythm following cardioversion in August, while amiodarone, until the current episode that began on March 22.  Seems to be an atypical atrial flutter, overdrive pacing only briefly interrupts the arrhythmia suggesting it might be a left atrial reentry circuit.  Compliant with anticoagulation with Eliquis.  CHA2DS2-VASc 4 (age 65, CHF, hypertension).   No history of embolic events.  He appears to be reasonably well compensated, without overt hypervolemia/heart failure or weight change.  His overall exercise capacity has diminished however..  Scheduled for cardioversion this Wednesday, April 27 with option for referral for "touchup" radiofrequency ablation  with Dr. Curt Bears if he again has recurrent arrhythmia. 2. SSS: Heart rate histogram appears quite blunted.  Increase the contribution of the minute ventilation sensor a month ago, will defer further adjustments for another couple of months. 3. HTN: Well-controlled, has some symptoms of orthostatic hypotension likely due to the concomitant use of diltiazem, furosemide as well as tamsulosin and citalopram. 4. Obesity: We have discussed the relationship between weight and the burden of atrial fibrillation. 5. PM: With the ability to program longer AV delays, the burden of ventricular pacing is lower, but he still has 61% ventricular pacing.  He has received his home monitor and we will start doing remote downloads. 6. CHF: Overt evidence of hypervolemia by physical exam.  His echocardiogram shows slight reduction in LVEF to 45-50%.  We will plan to recheck this once we get him back in normal rhythm. 7. OSA: He reports 100% compliance with CPAP and he denies daytime hypersomnolence.  We were unable to estimate PA pressure on his last echo.  I reinforced the need for compliance with CPAP.     Medication Adjustments/Labs and Tests Ordered: Current medicines are reviewed at length with the patient today.  Concerns regarding medicines are outlined above.  Medication changes, Labs and Tests ordered today are listed in the Patient Instructions below. Patient Instructions  Medication Instructions:  No changes *If you need a refill on your cardiac medications before your next appointment, please call your pharmacy*   Follow-Up: At [facility], you and your health needs are our priority.  As part of our continuing mission to provide you with exceptional heart care, we have created designated Provider Care Teams.  These Care Teams include your primary Cardiologist (physician) and Advanced Practice Providers (APPs -  Physician Assistants and Nurse Practitioners) who  all work together to provide you with the  care you need, when you need it.  We recommend signing up for the patient portal called "MyChart".  Sign up information is provided on this After Visit Summary.  MyChart is used to connect with patients for Virtual Visits (Telemedicine).  Patients are able to view lab/test results, encounter notes, upcoming appointments, etc.  Non-urgent messages can be sent to your provider as well.   To learn more about what you can do with MyChart, go to [email or web address].    Your next appointment:   Follow up in 2-3 weeks with Dr. Sallyanne Kuster    You are scheduled for a  Cardioversion on 12/26/20 with Dr. Sallyanne Kuster.  Please arrive at the [facility] (Main Entrance A) at [facility]: [address] [place], [place] [address] at 9 am. (1 hour prior to procedure)  DIET: Nothing to eat or drink after midnight except a sip of water with medications (see medication instructions below)  Medication Instructions: Hold the furosemide  Continue your anticoagulant: Eliquis You will need to continue your anticoagulant after your procedure until you  are told by your provider that it is safe to stop   Labs:   Labs today  You will need to have the coronavirus test completed prior to your procedure. An appointment has been made at 9:40 on 12/24/20. This is a Drive Up Visit at [id number], [place], [place] [address]. Please tell them that you are there for procedure testing. Stay in your car and someone will be with you shortly. Please make sure to have all other labs completed before this test because you will need to stay quarantined until your procedure.  You must have a responsible person to drive you home and stay in the waiting area during your procedure. Failure to do so could result in cancellation.  Bring your insurance cards.  *Special Note: Every effort is made to have your procedure done on time. Occasionally there are emergencies that occur at the hospital that may cause delays. Please be  patient if a delay does occur.          Signed, Sanda Klein, MD  12/26/2020 8:43 AM    [facility] [place], [place], [place]  [address] Phone: [phone]; Fax: [phone]    Patient ID: Shane [name], male   DOB: 13-Feb-1946, 75 y.o.   MRN: [id number]     Cardiology Office Note    Date:  12/26/2020   ID:  Rafa, Casten 08/28/1946, MRN [id number]  PCP:  Albina Billet, MD  Cardiologist:   Sanda Klein, MD   No chief complaint on file.   History of Present Illness:  Shane [name] is a 75 y.o. male who presents for recurrent persistent atrial fibrillation and pacemaker check.  He has a history of tachycardia-bradycardia syndrome, chronic diastolic heart failure (last echo April 2021 shows mildly depressed LVEF 45-50%), morbid obesity, obstructive sleep apnea on CPAP.  He underwent radiofrequency ablation for atrial fibrillation in August 2018 with a recurrent atrial fibrillation requiring cardioversion in February 2021 and June 2021, then started on amiodarone and underwent successful cardioversion with lasting benefit on April 11, 2020.  He underwent pacemaker generator change out last month [facility], August 06, 2020).  A couple of weeks later he had a small red the draining spot at the medial corner of the surgical site and Dr. Already removed a stitch from that  area and gave him a short course of antibiotics.  Subsequently the wound has healed very nicely.  There is no evidence of redness, tenderness or any additional drainage.  The scar has sealed over.  He has not had any fever or chills.  Interrogation of his pacemaker today shows that he has not had any atrial fibrillation since device change out.  He has 100% atrial pacing and does not have any underlying atrial activity, although he has normal AV conduction.  He only has 12% ventricular pacing.  His heart rate histogram is extremely blunted and I increased the  contribution of his minute ventilation sensor today.  He has had some fatigue problems.  He reports compliance with CPAP.  He denies daytime hypersomnolence.  He has not had problems with shortness of breath or chest discomfort.  His borderline low blood pressure today concerns him, but he has not had dizziness lightheadedness or syncope.  He denies any focal neurological events.  He has not had any bleeding problems on Eliquis.  Past Medical History:  Diagnosis Date  . Atrioventricular block   . Degenerative joint disease   . Gout   . Paroxysmal atrial fibrillation (HCC)   . Presence of permanent cardiac pacemaker 03/12/10   guidant  ([place])  . Sinus node dysfunction (HCC)   . Sleep apnea    CPAP  . Wears hearing aid in both ears     Past Surgical History:  Procedure Laterality Date  . ATRIAL FIBRILLATION ABLATION N/A 04/03/2017   Procedure: Atrial Fibrillation Ablation;  Surgeon: Constance Haw, MD;  Location: [place] CV LAB;  Service: Cardiovascular;  Laterality: N/A;  . CARDIAC CATHETERIZATION  10/13/1994   No CAD  . CARDIAC CATHETERIZATION  05/10/2003   No CAD  . CARDIOVERSION N/A 10/21/2016   Procedure: CARDIOVERSION;  Surgeon: Sanda Klein, MD;  Location: [place];  Service: Cardiovascular;  Laterality: N/A;  . CARDIOVERSION N/A 05/15/2017   Procedure: CARDIOVERSION;  Surgeon: Fay Records, MD;  Location: [place];  Service: Cardiovascular;  Laterality: N/A;  . CARDIOVERSION N/A 10/21/2019   Procedure: CARDIOVERSION;  Surgeon: Sanda Klein, MD;  Location: [facility] ENDOSCOPY;  Service: Cardiovascular;  Laterality: N/A;  . CARDIOVERSION N/A 02/24/2020   Procedure: CARDIOVERSION;  Surgeon: Lelon Perla, MD;  Location: [facility] ENDOSCOPY;  Service: Cardiovascular;  Laterality: N/A;  . CARDIOVERSION N/A 04/11/2020   Procedure: CARDIOVERSION;  Surgeon: Skeet Latch, MD;  Location: [place];  Service: Cardiovascular;  Laterality: N/A;  . CATARACT  EXTRACTION W/PHACO Left 05/18/2019   Procedure: CATARACT EXTRACTION PHACO AND INTRAOCULAR LENS PLACEMENT ([place]) LEFT  1:21 11.0% 9.01;  Surgeon: Leandrew Koyanagi, MD;  Location: [place];  Service: Ophthalmology;  Laterality: Left;  sleep apnea  . CATARACT EXTRACTION W/PHACO Right 06/29/2019   Procedure: CATARACT EXTRACTION PHACO AND INTRAOCULAR LENS PLACEMENT (IOC) RIGHT  01:14.2  14.5%  10.92;  Surgeon: Leandrew Koyanagi, MD;  Location: [place];  Service: Ophthalmology;  Laterality: Right;  sleep apnea  . CYST EXCISION  03/2019   on back   . NM MYOCAR PERF WALL MOTION  05/07/2011   Lexiscan: No ishcemia  . PERMANENT PACEMAKER INSERTION  03/12/2010   guidant  . PPM GENERATOR CHANGEOUT N/A 08/06/2020   Procedure: PPM GENERATOR CHANGEOUT;  Surgeon: Sanda Klein, MD;  Location: [place] [facility];  Service: Cardiovascular;  Laterality: N/A;  . ROTATOR CUFF REPAIR  1996  . SHOULDER ARTHROSCOPY    . US ECHOCARDIOGRAPHY  05/07/2011  mod. LVH,LA mod. dilated,borderline aortic root dilatation    Outpatient Medications Prior to Visit  Medication Sig Dispense Refill  . acetaminophen (TYLENOL) 650 MG CR tablet Take 1,300 mg by mouth every 8 (eight) hours as needed for pain.    Marland Kitchen allopurinol (ZYLOPRIM) 300 MG tablet Take 1 tablet (300 mg total) by mouth daily. 30 tablet 2  . amiodarone (PACERONE) 200 MG tablet Take 1 tablet (200 mg total) by mouth daily. 30 tablet 6  . anastrozole (ARIMIDEX) 1 MG tablet Take 1 mg by mouth at bedtime.     . ARTIFICIAL TEAR SOLUTION OP Place 1 drop into both eyes daily as needed (dry eyes).    . Ascorbic Acid (VITAMIN C) 1000 MG tablet Take 1,000 mg by mouth daily.    . Cholecalciferol (VITAMIN D-3) 1000 UNITS CAPS Take 1,000 Units by mouth at bedtime.     . citalopram (CELEXA) 40 MG tablet Take 1 tablet (40 mg total) by mouth daily. MUST RECEIVE FUTURE REFILLS FROM PCP. (Patient taking differently: Take 40 mg by mouth at bedtime.) 30 tablet 0   . Co-Enzyme Q-10 100 MG CAPS Take 400 mg by mouth daily.     . colchicine 0.6 MG tablet Take 0.5 tablets (0.3 mg total) by mouth daily as needed. (Patient taking differently: Take 0.3 mg by mouth daily as needed (gout).) 30 tablet 2  . diltiazem (CARDIZEM CD) 120 MG 24 hr capsule Take 1 capsule (120 mg total) by mouth daily. 90 capsule 1  . doxycycline (VIBRAMYCIN) 100 MG capsule Take 100 mg by mouth 2 (two) times daily.    . fluticasone (FLONASE) 50 MCG/ACT nasal spray Place into both nostrils.    . furosemide (LASIX) 40 MG tablet Take 1.5 tablets (60 mg total) by mouth daily. 45 tablet 11  . montelukast (SINGULAIR) 10 MG tablet Take 10 mg by mouth daily.    . Multiple Vitamin (MULTIVITAMIN WITH MINERALS) TABS tablet Take 1 tablet by mouth daily.    . Omega-3 Fatty Acids (FISH OIL) 1200 MG CAPS Take 1,200 mg by mouth 2 (two) times daily.     Marland Kitchen OVER THE COUNTER MEDICATION Take 100 mg by mouth daily. CBD oil    . Phenylephrine-APAP-guaiFENesin (MUCINEX SINUS-MAX CONG & PAIN PO) Take 2 tablets by mouth daily as needed (sinus headaches).     . potassium chloride SA (KLOR-CON) 20 MEQ tablet Take 1 tablet (20 mEq total) by mouth daily. 90 tablet 3  . psyllium (HYDROCIL/METAMUCIL) 95 % PACK Take 1 packet by mouth at bedtime.    . pyridOXINE (VITAMIN B-6) 100 MG tablet Take 100 mg by mouth daily.    . sodium chloride (OCEAN) 0.65 % SOLN nasal spray Place 1 spray into both nostrils as needed for congestion.    . tamsulosin (FLOMAX) 0.4 MG CAPS capsule Take 1 capsule (0.4 mg total) by mouth daily. 30 capsule 11  . Testosterone 1.62 % GEL Apply 3 Pump topically daily. Shoulders  1  . vitamin B-12 (CYANOCOBALAMIN) 1000 MCG tablet Take 1,000 mcg by mouth daily.    Marland Kitchen zinc gluconate 50 MG tablet Take 50 mg by mouth daily.    Marland Kitchen apixaban (ELIQUIS) 5 MG TABS tablet Take 1 tablet (5 mg total) by mouth 2 (two) times daily. 60 tablet 4   No facility-administered medications prior to visit.     Allergies:    Patient has no known allergies.   Social History   Socioeconomic History  . Marital status: Married  Spouse name: Not on file  . Number of children: Not on file  . Years of education: Not on file  . Highest education level: Not on file  Occupational History  . Not on file  Tobacco Use  . Smoking status: Former Smoker    Types: Cigars    Quit date: 05/11/1993    Years since quitting: 27.6  . Smokeless tobacco: Former Systems developer    Types: Chew    Quit date: 05/11/1993  Vaping Use  . Vaping Use: Never used  Substance and Sexual Activity  . Alcohol use: Not Currently    Comment: social  . Drug use: No  . Sexual activity: Not on file  Other Topics Concern  . Not on file  Social History Narrative  . Not on file   Social Determinants of Health   Financial Resource Strain: Not on file  Food Insecurity: Not on file  Transportation Needs: Not on file  Physical Activity: Not on file  Stress: Not on file  Social Connections: Not on file     Family History:  The patient's family history includes Alzheimer's disease in his father; Dementia in his sister; Heart disease in his sister; Heart failure in his mother; Stroke in his mother.   ROS:   Please see the history of present illness.   All other systems are reviewed and are negative.   PHYSICAL EXAM:   VS:  BP 106/60   Pulse 70   Ht 5\' 11"  (1.803 m)   Wt 262 lb 3.2 oz (118.9 kg)   SpO2 96%   BMI 36.57 kg/m      General: Alert, oriented x3, no distress, severely obese, well-healed left subclavian pacemaker scar Head: no evidence of trauma, PERRL, EOMI, no exophtalmos or lid lag, no myxedema, no xanthelasma; normal ears, nose and oropharynx Neck: normal jugular venous pulsations and no hepatojugular reflux; brisk carotid pulses without delay and no carotid bruits Chest: clear to auscultation, no signs of consolidation by percussion or palpation, normal fremitus, symmetrical and full respiratory excursions Cardiovascular:  normal position and quality of the apical impulse, regular rhythm, normal first and second heart sounds, no murmurs, rubs or gallops Abdomen: no tenderness or distention, no masses by palpation, no abnormal pulsatility or arterial bruits, normal bowel sounds, no hepatosplenomegaly Extremities: no clubbing, cyanosis or edema; 2+ radial, ulnar and brachial pulses bilaterally; 2+ right femoral, posterior tibial and dorsalis pedis pulses; 2+ left femoral, posterior tibial and dorsalis pedis pulses; no subclavian or femoral bruits Neurological: grossly nonfocal Psych: Normal mood and affect     Wt Readings from Last 3 Encounters:  12/24/20 262 lb 3.2 oz (118.9 kg)  11/26/20 262 lb (118.8 kg)  10/01/20 261 lb 6.4 oz (118.6 kg)      Studies/Labs Reviewed:   EKG:  EKG is not ordered today.  The intra-cardiac electrogram shows atrial paced, ventricular sensed rhythm  ASSESSMENT:    1. Atypical atrial flutter ([place])   2. SSS (sick sinus syndrome) ([place])   3. Essential hypertension   4. Class 2 severe obesity due to excess calories with serious comorbidity and body mass index (BMI) of 37.0 to 37.9 in adult ([place])   5. Presence of cardiac pacemaker   6. Chronic diastolic CHF (congestive heart failure) ([place])   7. OSA (obstructive sleep apnea)      PLAN:  In order of problems listed above:  8.  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  Compliant with anticoagulation with Eliquis.  CHA2DS2-VASc 4 (age 61, CHF, hypertension).   No history of embolic events. 9. SSS: Heart rate histogram appears quite blunted.  Minute ventilation sensor increased today.  Reevaluate in a couple of months. 10. HTN: Blood pressure fairly low today.  We will stop his diltiazem permanently. 11. Obesity: Strongly encouraged renewed efforts at weight loss, since the behavior he is the more likely is to have recurrent atrial  fibrillation 12. PM: Generator change roughly 1 month ago, had some problems with a stitch at the medial corner of the wound, but this has now healed very nicely.  Remote downloads every 3 months.  His current pacing system is MRI conditional. 13. CHF: He does not have signs or symptoms of hypervolemia and does not have a lot of issues with exertional dyspnea.  His echocardiogram shows slight reduction in LVEF to 45-50%, and with his new device we have been able to avoid the high burden of ventricular pacing that he had with the old system.  We will recheck an echocardiogram after several months to see if reduction in V pacing is accompanied by improved LV ejection fraction. 14. OSA: He reports 100% compliance with CPAP and he denies daytime hypersomnolence.  We were unable to estimate PA pressure on his last echo.  Have not yet been able to get him back in the sleep clinic with Dr. Claiborne Billings.     Medication Adjustments/Labs and Tests Ordered: Current medicines are reviewed at length with the patient today.  Concerns regarding medicines are outlined above.  Medication changes, Labs and Tests ordered today are listed in the Patient Instructions below. Patient Instructions  Medication Instructions:  No changes *If you need a refill on your cardiac medications before your next appointment, please call your pharmacy*   Follow-Up: At [facility], you and your health needs are our priority.  As part of our continuing mission to provide you with exceptional heart care, we have created designated Provider Care Teams.  These Care Teams include your primary Cardiologist (physician) and Advanced Practice Providers (APPs -  Physician Assistants and Nurse Practitioners) who all work together to provide you with the care you need, when you need it.  We recommend signing up for the patient portal called "MyChart".  Sign up information is provided on this After Visit Summary.  MyChart is used to connect with  patients for Virtual Visits (Telemedicine).  Patients are able to view lab/test results, encounter notes, upcoming appointments, etc.  Non-urgent messages can be sent to your provider as well.   To learn more about what you can do with MyChart, go to [email or web address].    Your next appointment:   Follow up in 2-3 weeks with Dr. Sallyanne Kuster    You are scheduled for a  Cardioversion on 12/26/20 with Dr. Sallyanne Kuster.  Please arrive at the [facility] (Main Entrance A) at [facility]: [address] [place], [place] [address] at 9 am. (1 hour prior to procedure)  DIET: Nothing to eat or drink after midnight except a sip of water with medications (see medication instructions below)  Medication Instructions: Hold the furosemide  Continue your anticoagulant: Eliquis You will need to continue your anticoagulant after your procedure until you  are told by your provider that it is safe to stop   Labs:   Labs today  You will need to have the coronavirus test completed prior to your procedure. An appointment has been made at  9:40 on 12/24/20. This is a Drive Up Visit at [id number], [place], [place] [address]. Please tell them that you are there for procedure testing. Stay in your car and someone will be with you shortly. Please make sure to have all other labs completed before this test because you will need to stay quarantined until your procedure.  You must have a responsible person to drive you home and stay in the waiting area during your procedure. Failure to do so could result in cancellation.  Bring your insurance cards.  *Special Note: Every effort is made to have your procedure done on time. Occasionally there are emergencies that occur at the hospital that may cause delays. Please be patient if a delay does occur.          Signed, Sanda Klein, MD  12/26/2020 8:43 AM    [facility] [place], [place], [place]  [address] Phone: [phone]; Fax: [phone]

## 2020-12-25 ENCOUNTER — Other Ambulatory Visit: Payer: Self-pay | Admitting: *Deleted

## 2020-12-25 ENCOUNTER — Other Ambulatory Visit: Payer: Self-pay | Admitting: Cardiovascular Disease

## 2020-12-25 LAB — SARS CORONAVIRUS 2 (TAT 6-24 HRS): SARS Coronavirus 2: NEGATIVE

## 2020-12-25 NOTE — Telephone Encounter (Signed)
31m, 118.9kg, scr 1.01 12/24/20, lovw/croitoru 10/26/20

## 2020-12-26 ENCOUNTER — Encounter (HOSPITAL_COMMUNITY): Payer: Self-pay | Admitting: Cardiovascular Disease

## 2020-12-26 ENCOUNTER — Ambulatory Visit (HOSPITAL_COMMUNITY): Payer: BC Managed Care – PPO | Admitting: Certified Registered Nurse Anesthetist

## 2020-12-26 ENCOUNTER — Encounter (HOSPITAL_COMMUNITY)
Admission: RE | Disposition: A | Discharge status: Home / Self Care | Payer: Self-pay | Source: Ambulatory Visit | Attending: Cardiovascular Disease

## 2020-12-26 ENCOUNTER — Ambulatory Visit (HOSPITAL_COMMUNITY)
Admission: RE | Admit: 2020-12-26 | Discharge: 2020-12-26 | Disposition: A | Discharge status: Home / Self Care | Payer: BC Managed Care – PPO | Source: Ambulatory Visit | Attending: Cardiovascular Disease | Admitting: Cardiovascular Disease

## 2020-12-26 DIAGNOSIS — Z20822 Contact with and (suspected) exposure to covid-19: Secondary | ICD-10-CM | POA: Insufficient documentation

## 2020-12-26 DIAGNOSIS — Z79899 Other long term (current) drug therapy: Secondary | ICD-10-CM | POA: Insufficient documentation

## 2020-12-26 DIAGNOSIS — G4733 Obstructive sleep apnea (adult) (pediatric): Secondary | ICD-10-CM | POA: Insufficient documentation

## 2020-12-26 DIAGNOSIS — Z7901 Long term (current) use of anticoagulants: Secondary | ICD-10-CM | POA: Insufficient documentation

## 2020-12-26 DIAGNOSIS — I484 Atypical atrial flutter: Secondary | ICD-10-CM | POA: Insufficient documentation

## 2020-12-26 DIAGNOSIS — I4819 Other persistent atrial fibrillation: Secondary | ICD-10-CM | POA: Insufficient documentation

## 2020-12-26 DIAGNOSIS — I4891 Unspecified atrial fibrillation: Secondary | ICD-10-CM | POA: Diagnosis not present

## 2020-12-26 DIAGNOSIS — I495 Sick sinus syndrome: Secondary | ICD-10-CM | POA: Diagnosis not present

## 2020-12-26 DIAGNOSIS — Z79811 Long term (current) use of aromatase inhibitors: Secondary | ICD-10-CM | POA: Insufficient documentation

## 2020-12-26 DIAGNOSIS — Z87891 Personal history of nicotine dependence: Secondary | ICD-10-CM | POA: Insufficient documentation

## 2020-12-26 DIAGNOSIS — I110 Hypertensive heart disease with heart failure: Secondary | ICD-10-CM | POA: Insufficient documentation

## 2020-12-26 DIAGNOSIS — Z950 Presence of cardiac pacemaker: Secondary | ICD-10-CM | POA: Diagnosis not present

## 2020-12-26 DIAGNOSIS — Z6836 Body mass index (BMI) 36.0-36.9, adult: Secondary | ICD-10-CM | POA: Insufficient documentation

## 2020-12-26 DIAGNOSIS — Z8249 Family history of ischemic heart disease and other diseases of the circulatory system: Secondary | ICD-10-CM | POA: Insufficient documentation

## 2020-12-26 HISTORY — PX: CARDIOVERSION: SHX1299

## 2020-12-26 SURGERY — CARDIOVERSION
Anesthesia: General

## 2020-12-26 MED ORDER — SODIUM CHLORIDE 0.9 % IV SOLN: INTRAVENOUS | Status: DC | PRN

## 2020-12-26 MED ORDER — PROPOFOL 10 MG/ML IV BOLUS
INTRAVENOUS | Status: DC | PRN
  Administered 2020-12-26: 40 mg via INTRAVENOUS
  Administered 2020-12-26: 20 mg via INTRAVENOUS
  Administered 2020-12-26: 50 mg via INTRAVENOUS

## 2020-12-26 NOTE — Interval H&P Note (Signed)
History and Physical Interval Note:  12/26/2020 9:16 AM  Shane [name]  has presented today for surgery, with the diagnosis of AFIB.  The various methods of treatment have been discussed with the patient and family. After consideration of risks, benefits and other options for treatment, the patient has consented to  Procedure(s): CARDIOVERSION (N/A) as a surgical intervention.  The patient's history has been reviewed, patient examined, no change in status, stable for surgery.  I have reviewed the patient's chart and labs.  Questions were answered to the patient's satisfaction.     [name]

## 2020-12-26 NOTE — Transfer of Care (Signed)
Immediate Anesthesia Transfer of Care Note  Patient: Shane [name]  Procedure(s) Performed: CARDIOVERSION (N/A )  Patient Location: Endoscopy Unit  Anesthesia Type:General  Level of Consciousness: drowsy and patient cooperative  Airway & Oxygen Therapy: Patient Spontanous Breathing  Post-op Assessment: Report given to RN and Post -op Vital signs reviewed and stable  Post vital signs: Reviewed and stable  Last Vitals:  Vitals Value Taken Time  BP 106/62   Temp    Pulse 61   Resp 19   SpO2 97     Last Pain:  Vitals:   12/26/20 0900  TempSrc: Temporal  PainSc: 0-No pain         Complications: No complications documented.

## 2020-12-26 NOTE — Op Note (Signed)
Procedure: Electrical Cardioversion Indications:  Atrial Fibrillation  Procedure Details:  Consent: Risks of procedure as well as the alternatives and risks of each were explained to the (patient/caregiver).  Consent for procedure obtained.  Time Out: Verified patient identification, verified procedure, site/side was marked, verified correct patient position, special equipment/implants available, medications/allergies/relevent history reviewed, required imaging and test results available.  Performed  Patient placed on cardiac monitor, pulse oximetry, supplemental oxygen as necessary.  Sedation given: Propofol 110 mg IV, Dr. Ermalene Postin Pacer pads placed anterior and posterior chest.  Cardioverted 1 time(s).  Cardioversion with synchronized biphasic 200J shock.  Evaluation: Findings: Post procedure EKG shows: A paced, V paced rhythm Complications: None Patient did tolerate procedure well.  Pacemaker check following the procedure shows normal device/leads function.  Time Spent Directly with the Patient:  30 minutes   Shane [name] 12/26/2020, 9:59 AM

## 2020-12-26 NOTE — Interval H&P Note (Signed)
History and Physical Interval Note:  12/26/2020 9:17 AM  Shane [name]  has presented today for surgery, with the diagnosis of AFIB.  The various methods of treatment have been discussed with the patient and family. After consideration of risks, benefits and other options for treatment, the patient has consented to  Procedure(s): CARDIOVERSION (N/A) as a surgical intervention.  The patient's history has been reviewed, patient examined, no change in status, stable for surgery.  I have reviewed the patient's chart and labs.  Questions were answered to the patient's satisfaction.     [name]

## 2020-12-26 NOTE — Discharge Instructions (Signed)
Electrical Cardioversion Electrical cardioversion is the delivery of a jolt of electricity to restore a normal rhythm to the heart. A rhythm that is too fast or is not regular keeps the heart from pumping well. In this procedure, sticky patches or metal paddles are placed on the chest to deliver electricity to the heart from a device. This procedure may be done in an emergency if:  There is low or no blood pressure as a result of the heart rhythm.  Normal rhythm must be restored as fast as possible to protect the brain and heart from further damage.  It may save a life. Follow these instructions at home:  Do not drive for 24 hours if you were given a sedative during your procedure.  Take over-the-counter and prescription medicines only as told by your health care provider.  Ask your health care provider how to check your pulse. Check it often.  Rest for 48 hours after the procedure or as told by your health care provider.  Avoid or limit your caffeine use as told by your health care provider.  Keep all follow-up visits as told by your health care provider. This is important. Contact a health care provider if:  You feel like your heart is beating too quickly or your pulse is not regular.  You have a serious muscle cramp that does not go away. Get help right away if:  You have discomfort in your chest.  You are dizzy or you feel faint.  You have trouble breathing or you are short of breath.  Your speech is slurred.  You have trouble moving an arm or leg on one side of your body.  Your fingers or toes turn cold or blue. Summary  Electrical cardioversion is the delivery of a jolt of electricity to restore a normal rhythm to the heart.  This procedure may be done right away in an emergency or may be a scheduled procedure if the condition is not an emergency.  Generally, this is a safe procedure.  After the procedure, check your pulse often as told by your health care  provider. c.

## 2020-12-26 NOTE — Anesthesia Procedure Notes (Signed)
Procedure Name: General with mask airway Date/Time: 12/26/2020 9:33 AM Performed by: Kathryne Hitch, CRNA Pre-anesthesia Checklist: Patient identified, Emergency Drugs available, Suction available and Patient being monitored Patient Re-evaluated:Patient Re-evaluated prior to induction Oxygen Delivery Method: Ambu bag Preoxygenation: Pre-oxygenation with 100% oxygen Induction Type: IV induction Ventilation: Mask ventilation without difficulty Placement Confirmation: positive ETCO2 Dental Injury: Teeth and Oropharynx as per pre-operative assessment

## 2020-12-27 ENCOUNTER — Encounter (HOSPITAL_COMMUNITY): Payer: Self-pay | Admitting: Cardiovascular Disease

## 2020-12-28 NOTE — Anesthesia Preprocedure Evaluation (Signed)
Anesthesia Evaluation  Patient identified by MRN, date of birth, ID band Patient awake    Reviewed: Allergy & Precautions, NPO status , Patient's Chart, lab work & pertinent test results  Airway Mallampati: II  TM Distance: >3 FB Neck ROM: Full    Dental  (+) Teeth Intact, Dental Advisory Given   Pulmonary sleep apnea , former smoker,    breath sounds clear to auscultation       Cardiovascular +CHF  + dysrhythmias Atrial Fibrillation + pacemaker  Rhythm:Irregular Rate:Abnormal     Neuro/Psych negative neurological ROS     GI/Hepatic negative GI ROS, Neg liver ROS,   Endo/Other  Obesity   Renal/GU negative Renal ROS     Musculoskeletal  (+) Arthritis ,   Abdominal   Peds  Hematology negative hematology ROS (+)   Anesthesia Other Findings Day of surgery medications reviewed with the patient.  Reproductive/Obstetrics                             Anesthesia Physical Anesthesia Plan  ASA: III  Anesthesia Plan: General   Post-op Pain Management:    Induction: Intravenous  PONV Risk Score and Plan: 2 and Treatment may vary due to age or medical condition  Airway Management Planned: Mask  Additional Equipment: None  Intra-op Plan:   Post-operative Plan:   Informed Consent: I have reviewed the patients History and Physical, chart, labs and discussed the procedure including the risks, benefits and alternatives for the proposed anesthesia with the patient or authorized representative who has indicated his/her understanding and acceptance.     Dental advisory given  Plan Discussed with: CRNA  Anesthesia Plan Comments:         Anesthesia Quick Evaluation

## 2020-12-28 NOTE — Anesthesia Postprocedure Evaluation (Signed)
Anesthesia Post Note  Patient: Shane [name]  Procedure(s) Performed: CARDIOVERSION (N/A )     Patient location during evaluation: Endoscopy Anesthesia Type: General Level of consciousness: awake and alert Pain management: pain level controlled Vital Signs Assessment: post-procedure vital signs reviewed and stable Respiratory status: spontaneous breathing, nonlabored ventilation, respiratory function stable and patient connected to nasal cannula oxygen Cardiovascular status: stable Postop Assessment: no apparent nausea or vomiting Anesthetic complications: no   No complications documented.  Last Vitals:  Vitals:   12/26/20 1010 12/26/20 1020  BP: 104/62 112/73  Pulse: (!) 59 (!) 59  Resp: 14 16  Temp:    SpO2: 97% 98%    Last Pain:  Vitals:   12/28/20 1106  TempSrc:   PainSc: 0-No pain                 Rane Dumm

## 2020-12-31 ENCOUNTER — Telehealth: Payer: Self-pay

## 2020-12-31 NOTE — Telephone Encounter (Signed)
.    Alert remote transmission reviewed.  Atrial Arrhythmia Burden of at least 6.0 hours in a 24 hour period.  Captured rhythm AT/AFL.  A burden 18%.  Per epic note recent successful cardioversion.  Known PAF.  Meds: Diltiazem, Amiodarone, Eliquis.   LOV with Dr. Sallyanne Kuster, 4/25 notes indicate: Scheduled for cardioversion this Wednesday, April 27 with option for referral for "touchup" radiofrequency ablation with Dr. Curt Bears if he again has recurrent arrhythmia.  Spoke with pt.  He states he is currently at the [facility] until next week, planning to return on 5/10 .  Over the weekend he was engaged in physical activity including replanting and noted to have felt fatigued, but denies any other symptoms.    Current reoccurrence of Af/Afl appears to have started 4/30 briefly, with reentry 5/1 and ongoing since.

## 2020-12-31 NOTE — Telephone Encounter (Signed)
We' ll set up an appointment with Dr. Curt Bears to discuss repeat RF ablation, please. Can cancel the 05/25 f/up.

## 2020-12-31 NOTE — Telephone Encounter (Signed)
Patient has agreed to see Dr. Curt Bears. Message sent to scheduling.   [place] beach and [place] on 01[place]09/21.   He stated that he has a convention in mid June in [place] and he is leery about going when he is in atrial fibrillation. He would like to have Dr. Victorino December advice on this. He has been advised that he can discuss this with Dr. Curt Bears as well if we can get him in before he leaves.

## 2021-01-01 NOTE — Telephone Encounter (Signed)
I don't think that AFib is a reason not to travel, as long as the rate remains well controlled (which it is) and he takes his anticoagulant.

## 2021-01-15 ENCOUNTER — Other Ambulatory Visit: Payer: Self-pay

## 2021-01-15 ENCOUNTER — Encounter: Payer: Self-pay | Admitting: Cardiology

## 2021-01-15 ENCOUNTER — Ambulatory Visit (INDEPENDENT_AMBULATORY_CARE_PROVIDER_SITE_OTHER): Payer: BC Managed Care – PPO | Admitting: Cardiology

## 2021-01-15 VITALS — BP 138/68 | HR 83 | Ht 71.0 in | Wt 254.0 lb

## 2021-01-15 DIAGNOSIS — I4819 Other persistent atrial fibrillation: Secondary | ICD-10-CM | POA: Diagnosis not present

## 2021-01-15 DIAGNOSIS — Z01818 Encounter for other preprocedural examination: Secondary | ICD-10-CM

## 2021-01-15 DIAGNOSIS — I484 Atypical atrial flutter: Secondary | ICD-10-CM

## 2021-01-15 DIAGNOSIS — Z01812 Encounter for preprocedural laboratory examination: Secondary | ICD-10-CM

## 2021-01-15 NOTE — Progress Notes (Signed)
Electrophysiology Office Note   Date:  01/15/2021   [name], Shane [birth year], MRN [id number]  PCP:  Albina Billet, MD  Cardiologist:  [place] Primary Electrophysiologist:  Belynda Pagaduan Meredith Leeds, MD    No chief complaint on file.    History of Present Illness: Shane [name] is a 75 y.o. male who is being seen today for the evaluation of atrial fibrillation at the request of Albina Billet, MD. Presenting today for electrophysiology evaluation.   He has a history of atrial fibrillation, sick sinus syndrome status post [employer] pacemaker, sleep apnea.  He is status post A. fib ablation 05/15/2017.  He had more episodes of atrial fibrillation, February 2021, June 2021, and August 2021.  He was started on amiodarone.  He developed increased chest congestion.  Review of his pacemaker indicated atrial fibrillation started around the time of his symptoms.  He is now status post repeat cardioversion 12/26/2020.  Today, denies symptoms of palpitations, chest pain, shortness of breath, orthopnea, PND, lower extremity edema, claudication, dizziness, presyncope, syncope, bleeding, or neurologic sequela. The patient is tolerating medications without difficulties.  Unfortunately he is back in atrial fibrillation today.  He does not have chest pain, but does endorse dyspnea on exertion and fatigue.  He feels that he is sleeping a little bit more than when he was in normal rhythm.  He can still do his daily activities, but has noticed that he is doing the much more slowly.  He is interested in getting back into normal rhythm.   Past Medical History:  Diagnosis Date  . Atrioventricular block   . Degenerative joint disease   . Gout   . Paroxysmal atrial fibrillation (HCC)   . 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 Sinus node dysfunction (HCC)   . Sleep apnea    CPAP  . Wears hearing aid in both ears    Past Surgical History:   Procedure Laterality Date  . ATRIAL FIBRILLATION ABLATION N/A 04/03/2017   Procedure: Atrial Fibrillation Ablation;  Surgeon: Constance Haw, MD;  Location: [facility];  Service: Cardiovascular;  Laterality: N/A;  . CARDIAC CATHETERIZATION  10/13/1994   No CAD  . CARDIAC CATHETERIZATION  05/10/2003   No CAD  . CARDIOVERSION N/A 10/21/2016   Procedure: CARDIOVERSION;  Surgeon: Sanda Klein, MD;  Location: [place];  Service: Cardiovascular;  Laterality: N/A;  . CARDIOVERSION N/A 05/15/2017   Procedure: CARDIOVERSION;  Surgeon: Fay Records, MD;  Location: [place];  Service: Cardiovascular;  Laterality: N/A;  . CARDIOVERSION N/A 10/21/2019   Procedure: CARDIOVERSION;  Surgeon: Sanda Klein, MD;  Location: [facility];  Service: Cardiovascular;  Laterality: N/A;  . CARDIOVERSION N/A 02/24/2020   Procedure: CARDIOVERSION;  Surgeon: Lelon Perla, MD;  Location: [facility];  Service: Cardiovascular;  Laterality: N/A;  . CARDIOVERSION N/A 04/11/2020   Procedure: CARDIOVERSION;  Surgeon: Skeet Latch, MD;  Location: [place];  Service: Cardiovascular;  Laterality: N/A;  . CARDIOVERSION N/A 12/26/2020   Procedure: CARDIOVERSION;  Surgeon: Sanda Klein, MD;  Location: [place];  Service: Cardiovascular;  Laterality: N/A;  . CATARACT EXTRACTION W/PHACO Left 05/18/2019   Procedure: CATARACT EXTRACTION PHACO AND INTRAOCULAR LENS PLACEMENT ([place]) LEFT  1:21 11.0% 9.01;  Surgeon: Leandrew Koyanagi, MD;  Location: [place];  Service: Ophthalmology;  Laterality: Left;  sleep apnea  . CATARACT EXTRACTION W/PHACO Right 06/29/2019   Procedure: CATARACT EXTRACTION PHACO AND INTRAOCULAR LENS  PLACEMENT ([facility]) RIGHT  01:14.2  14.5%  10.92;  Surgeon: Leandrew Koyanagi, MD;  Location: [place];  Service: Ophthalmology;  Laterality: Right;  sleep apnea  . CYST EXCISION  03/2019   on back   . NM MYOCAR PERF WALL MOTION  05/07/2011   Lexiscan: No  ishcemia  . PERMANENT PACEMAKER INSERTION  03/12/2010   guidant  . PPM GENERATOR CHANGEOUT N/A 08/06/2020   Procedure: PPM GENERATOR CHANGEOUT;  Surgeon: Sanda Klein, MD;  Location: [place] [facility];  Service: Cardiovascular;  Laterality: N/A;  . ROTATOR CUFF REPAIR  1996  . SHOULDER ARTHROSCOPY    . US ECHOCARDIOGRAPHY  05/07/2011   mod. LVH,LA mod. dilated,borderline aortic root dilatation     Current Outpatient Medications  Medication Sig Dispense Refill  . acetaminophen (TYLENOL) 650 MG CR tablet Take 1,300 mg by mouth every 8 (eight) hours as needed for pain.    Marland Kitchen allopurinol (ZYLOPRIM) 300 MG tablet Take 1 tablet (300 mg total) by mouth daily. 30 tablet 2  . amiodarone (PACERONE) 200 MG tablet Take 1 tablet (200 mg total) by mouth daily. 30 tablet 6  . anastrozole (ARIMIDEX) 1 MG tablet Take 1 mg by mouth at bedtime.     . ARTIFICIAL TEAR SOLUTION OP Place 1 drop into both eyes daily as needed (dry eyes).    . Ascorbic Acid (VITAMIN C) 1000 MG tablet Take 1,000 mg by mouth daily.    . Cholecalciferol (VITAMIN D-3) 1000 UNITS CAPS Take 1,000 Units by mouth at bedtime.     . citalopram (CELEXA) 40 MG tablet Take 1 tablet (40 mg total) by mouth daily. MUST RECEIVE FUTURE REFILLS FROM PCP. (Patient taking differently: Take 40 mg by mouth at bedtime.) 30 tablet 0  . Co-Enzyme Q-10 100 MG CAPS Take 400 mg by mouth daily.     . colchicine 0.6 MG tablet Take 0.5 tablets (0.3 mg total) by mouth daily as needed. (Patient taking differently: Take 0.3 mg by mouth daily as needed (gout).) 30 tablet 2  . diltiazem (CARDIZEM CD) 120 MG 24 hr capsule Take 1 capsule (120 mg total) by mouth daily. 90 capsule 1  . doxycycline (VIBRAMYCIN) 100 MG capsule Take 100 mg by mouth 2 (two) times daily.    Marland Kitchen ELIQUIS 5 MG TABS tablet Take 1 tablet (5 mg total) by mouth 2 (two) times daily. 180 tablet 1  . fluticasone (FLONASE) 50 MCG/ACT nasal spray Place into both nostrils.    . furosemide (LASIX) 40 MG  tablet Take 1.5 tablets (60 mg total) by mouth daily. 45 tablet 11  . montelukast (SINGULAIR) 10 MG tablet Take 10 mg by mouth daily.    . Multiple Vitamin (MULTIVITAMIN WITH MINERALS) TABS tablet Take 1 tablet by mouth daily.    . Omega-3 Fatty Acids (FISH OIL) 1200 MG CAPS Take 1,200 mg by mouth 2 (two) times daily.     Marland Kitchen OVER THE COUNTER MEDICATION Take 100 mg by mouth daily. CBD oil    . Phenylephrine-APAP-guaiFENesin (MUCINEX SINUS-MAX CONG & PAIN PO) Take 2 tablets by mouth daily as needed (sinus headaches).     . potassium chloride SA (KLOR-CON) 20 MEQ tablet Take 1 tablet (20 mEq total) by mouth daily. 90 tablet 3  . psyllium (HYDROCIL/METAMUCIL) 95 % PACK Take 1 packet by mouth at bedtime.    . pyridOXINE (VITAMIN B-6) 100 MG tablet Take 100 mg by mouth daily.    . sodium chloride (OCEAN) 0.65 % SOLN nasal spray  Place 1 spray into both nostrils as needed for congestion.    . tamsulosin (FLOMAX) 0.4 MG CAPS capsule Take 1 capsule (0.4 mg total) by mouth daily. 30 capsule 11  . Testosterone 1.62 % GEL Apply 3 Pump topically daily. Shoulders  1  . vitamin B-12 (CYANOCOBALAMIN) 1000 MCG tablet Take 1,000 mcg by mouth daily.    Marland Kitchen zinc gluconate 50 MG tablet Take 50 mg by mouth daily.     No current facility-administered medications for this visit.    Allergies:   Patient has no known allergies.   Social History:  The patient  reports that he quit smoking about 27 years ago. His smoking use included cigars. He quit smokeless tobacco use about 27 years ago.  His smokeless tobacco use included chew. He reports previous alcohol use. He reports that he does not use drugs.   Family History:  The patient's family history includes Alzheimer's disease in his father; Dementia in his sister; Heart disease in his sister; Heart failure in his mother; Stroke in his mother.   ROS:  Please see the history of present illness.   Otherwise, review of systems is positive for none.   All other systems are  reviewed and negative.   PHYSICAL EXAM: VS:  BP 138/68   Pulse 83   Ht 5\' 11"  (1.803 m)   Wt 254 lb (115.2 kg)   BMI 35.43 kg/m  , BMI Body mass index is 35.43 kg/m. GEN: Well nourished, well developed, in no acute distress  HEENT: normal  Neck: no JVD, carotid bruits, or masses Cardiac: Irregular; no murmurs, rubs, or gallops,no edema  Respiratory:  clear to auscultation bilaterally, normal work of breathing GI: soft, nontender, nondistended, + BS MS: no deformity or atrophy  Skin: warm and dry, device site well healed Neuro:  Strength and sensation are intact Psych: euthymic mood, full affect  EKG:  EKG is ordered today. Personal review of the ekg ordered shows atrial flutter, rate 83  Personal review of the device interrogation today. Results in [place]: 03/28/2020: ALT 22; TSH 4.669 12/24/2020: BUN 17; Creatinine, Ser 1.01; Hemoglobin 14.2; Platelets 221; Potassium 4.2; Sodium 141    Lipid Panel  No results found for: CHOL, TRIG, HDL, CHOLHDL, VLDL, LDLCALC, LDLDIRECT   Wt Readings from Last 3 Encounters:  01/15/21 254 lb (115.2 kg)  12/26/20 260 lb (117.9 kg)  12/24/20 262 lb 3.2 oz (118.9 kg)      Other studies Reviewed: Additional studies/ records that were reviewed today include: TTE 11/06/16  Review of the above records today demonstrates:  - Left ventricle: Septal hypokinesis. The cavity size was   moderately dilated. Wall thickness was increased in a pattern of   mild LVH. Systolic function was normal. The estimated ejection   fraction was in the range of 50% to 55%. - Aortic valve: There was trivial regurgitation. - Left atrium: The atrium was mildly dilated.   ASSESSMENT AND PLAN:  1.  Persistent atrial fibrillation/atypical atrial flutter: Currently on Eliquis and amiodarone.  High risk medication monitoring.  Status post ablation 04/30/2017.  CHA2DS2-VASc of 2.  Unfortunately he is continued to have episodes of left atrial flutter.  He  has had cardioversions and is currently on amiodarone without control.  I do think he would benefit from repeat ablation.  He understands the risks and has agreed to the procedure.  Risk, benefits, and alternatives to EP study and radiofrequency ablation for afib were also discussed in  detail today. These risks include but are not limited to stroke, bleeding, vascular damage, tamponade, perforation, damage to the esophagus, lungs, and other structures, pulmonary vein stenosis, worsening renal function, and death. The patient understands these risk and wishes to proceed.  We Dasani Crear therefore proceed with catheter ablation at the next available time.  Carto, ICE, anesthesia are requested for the procedure.  Almee Pelphrey also obtain CT PV protocol prior to the procedure to exclude LAA thrombus and further evaluate atrial anatomy.   2.  Tachybradycardia syndrome: Status post Catering manager.  Device.  No changes at this time.    3.  Chronic diastolic heart failure: No obvious volume overload  4.  Obstructive sleep apnea: CPAP compliance encouraged  5.  Morbid obesity: Diet and exercise encouraged  Case discussed with primary cardiology  Current medicines are reviewed at length with the patient today.   The patient does not have concerns regarding his medicines.  The following changes were made today: None  Labs/ tests ordered today include:  Orders Placed This Encounter  Procedures  . CT CARDIAC MORPH/PULM VEIN W/CM&W/O CA SCORE  . Basic metabolic panel  . CBC  . EKG 12-Lead    Disposition:   FU with Avaeh Ewer 3 months  Signed, Shatasia Cutshaw Meredith Leeds, MD  01/15/2021 2:57 PM     [address] [place] [address] [phone] (office) [phone] (fax)

## 2021-01-15 NOTE — Patient Instructions (Signed)
Medication Instructions:  Your physician recommends that you continue on your current medications as directed. Please refer to the Current Medication list given to you today.  *If you need a refill on your cardiac medications before your next appointment, please call your pharmacy*   Lab Work: Pre procedure labs 02/18/2021:  BMP & CBC If you have labs (blood work) drawn today and your tests are completely normal, you will receive your results only by: Marland Kitchen MyChart Message (if you have MyChart) OR . A paper copy in the mail If you have any lab test that is abnormal or we need to change your treatment, we will call you to review the results.   Testing/Procedures: Your physician has requested that you have cardiac CT within 7 days PRIOR to your ablation. Cardiac computed tomography (CT) is a painless test that uses an x-ray machine to take clear, detailed pictures of your heart.  Please follow instruction below located under "other instructions". You will get a call from our office to schedule the date for this test.  Your physician has recommended that you have an ablation. Catheter ablation is a medical procedure used to treat some cardiac arrhythmias (irregular heartbeats). During catheter ablation, a long, thin, flexible tube is put into a blood vessel in your groin (upper thigh), or neck. This tube is called an ablation catheter. It is then guided to your heart through the blood vessel. Radio frequency waves destroy small areas of heart tissue where abnormal heartbeats may cause an arrhythmia to start. Please follow instruction below located under "other instructions".   Follow-Up: At [facility], you and your health needs are our priority.  As part of our continuing mission to provide you with exceptional heart care, we have created designated Provider Care Teams.  These Care Teams include your primary Cardiologist (physician) and Advanced Practice Providers (APPs -  Physician Assistants and  Nurse Practitioners) who all work together to provide you with the care you need, when you need it.  We recommend signing up for the patient portal called "MyChart".  Sign up information is provided on this After Visit Summary.  MyChart is used to connect with patients for Virtual Visits (Telemedicine).  Patients are able to view lab/test results, encounter notes, upcoming appointments, etc.  Non-urgent messages can be sent to your provider as well.   To learn more about what you can do with MyChart, go to [email or web address].    Your next appointment:   1 month(s) after your ablation  The format for your next appointment:   In Person  Provider:   AFib clinic   Thank you for choosing [facility]!!   Trinidad Curet, RN [phone]    Other Instructions  Your cardiac CT will be scheduled at:  [facility] [address] [place], [place] [address] [phone]   Please arrive at the [facility] main entrance (entrance A) of [facility] 30 minutes prior to test start time. Proceed to the [facility] Radiology Department (first floor) to check-in and test prep.  Please follow these instructions carefully (unless otherwise directed):  Hold all erectile dysfunction medications at least 3 days (72 hrs) prior to test.  On the Night Before the Test: . Be sure to Drink plenty of water. . Do not consume any caffeinated/decaffeinated beverages or chocolate 12 hours prior to your test. . Do not take any antihistamines 12 hours prior to your test.  On the Day of the Test: . Drink plenty of  water until 1 hour prior to the test. . Do not eat any food 4 hours prior to the test. . You may take your regular medications prior to the test.  . Take metoprolol (Lopressor) 100 mg two hours prior to test. . HOLD Furosemide/Hydrochlorothiazide morning of the test.  After the Test: . Drink plenty of water. . After receiving IV contrast, you may experience a mild  flushed feeling. This is normal. . On occasion, you may experience a mild rash up to 24 hours after the test. This is not dangerous. If this occurs, you can take Benadryl 25 mg and increase your fluid intake. . If you experience trouble breathing, this can be serious. If it is severe call 911 IMMEDIATELY. If it is mild, please call our office. . If you take any of these medications: Glipizide/Metformin, Avandament, Glucavance, please do not take 48 hours after completing test unless otherwise instructed.   Once we have confirmed authorization from your insurance company, we will call you to set up a date and time for your test. Based on how quickly your insurance processes prior authorizations requests, please allow up to 4 weeks to be contacted for scheduling your Cardiac CT appointment. Be advised that routine Cardiac CT appointments could be scheduled as many as 8 weeks after your provider has ordered it.  For non-scheduling related questions, please contact the cardiac imaging nurse navigator should you have any questions/concerns: Marchia Bond, Cardiac Imaging Nurse Navigator Gordy Clement, Cardiac Imaging Nurse Navigator [facility] Direct Office Dial: [phone]   For scheduling needs, including cancellations and rescheduling, please call [place], [phone].       Electrophysiology/Ablation Procedure Instructions   You are scheduled for a(n)  ablation on 03/01/2021 with Dr. Allegra Lai.   1.   Pre procedure testing-             A.  LAB WORK --- On 02/18/2021  for your pre procedure blood work.  You do NOT need to be fasting.  You can stop by the [employer] office anytime between 7:30 am - 4:30 pm   2. On the day of your procedure 03/01/2021 you will go to [facility] [phone]) at 5:30 am.  Dennis Bast will go to the main entrance A [employer]) and enter where the [employer] are.  Your driver will drop you off and you will head  down the hallway to ADMITTING.  You may have one support person come in to the hospital with you.  They will be asked to wait in the waiting room. It is OK to have someone drop you off and come back when you are ready to be discharged.   3.   Do not eat or drink after midnight prior to your procedure.   4.   On the morning of your procedure do NOT take any medication. Do not miss any doses of your blood thinner prior to the morning of your procedure or your procedure will need to be rescheduled.   5.  Plan for an overnight stay but you may be discharged after your procedure, if you use your phone frequently bring your phone charger. If you are discharged after your procedure you will need someone to drive you home and be with you for 24 hours after your procedure.   6. You will follow up with the AFIB clinic 4 weeks after your procedure.  You will follow up with Dr. Curt Bears  3  months after your procedure.  These appointments will be made for you.   * If you have ANY questions please call the office [phone] and ask for Ercie Eliasen RN or send me a MyChart message   * Occasionally, EP Studies and ablations can become lengthy.  Please make your family aware of this before your procedure starts.  Average time ranges from 2-8 hours for EP studies/ablations.  Your physician will call your family after the procedure with the results.

## 2021-01-18 ENCOUNTER — Other Ambulatory Visit: Payer: Self-pay

## 2021-01-23 ENCOUNTER — Ambulatory Visit: Payer: BC Managed Care – PPO | Admitting: Family

## 2021-01-23 ENCOUNTER — Other Ambulatory Visit: Payer: Self-pay | Admitting: Cardiovascular Disease

## 2021-02-18 ENCOUNTER — Other Ambulatory Visit: Payer: BC Managed Care – PPO

## 2021-02-20 ENCOUNTER — Telehealth (HOSPITAL_COMMUNITY): Payer: Self-pay | Admitting: Emergency Medicine

## 2021-02-20 DIAGNOSIS — I10 Essential (primary) hypertension: Secondary | ICD-10-CM

## 2021-02-20 MED ORDER — METOPROLOL TARTRATE 100 MG PO TABS: 100.0000 mg | ORAL_TABLET | Freq: Once | ORAL | 0 refills | Status: DC

## 2021-02-20 NOTE — Telephone Encounter (Signed)
Reaching out to patient to offer assistance regarding upcoming cardiac imaging study; pt verbalizes understanding of appt date/time, parking situation and where to check in, pre-test NPO status and medications ordered, and verified current allergies; name and call back number provided for further questions should they arise Marchia Bond RN Navigator Cardiac Imaging [facility] [phone] office [phone] cell  100mg  metoprolol tartrate 2 hr prior

## 2021-02-21 ENCOUNTER — Other Ambulatory Visit: Payer: BC Managed Care – PPO

## 2021-02-21 ENCOUNTER — Other Ambulatory Visit: Payer: Self-pay

## 2021-02-21 DIAGNOSIS — I4819 Other persistent atrial fibrillation: Secondary | ICD-10-CM

## 2021-02-21 DIAGNOSIS — Z01812 Encounter for preprocedural laboratory examination: Secondary | ICD-10-CM

## 2021-02-21 LAB — BASIC METABOLIC PANEL WITH GFR
BUN/Creatinine Ratio: 17 (ref 10–24)
BUN: 17 mg/dL (ref 8–27)
CO2: 31 mmol/L — ABNORMAL HIGH (ref 20–29)
Calcium: 9 mg/dL (ref 8.6–10.2)
Chloride: 101 mmol/L (ref 96–106)
Creatinine, Ser: 0.99 mg/dL (ref 0.76–1.27)
Glucose: 88 mg/dL (ref 65–99)
Potassium: 4.2 mmol/L (ref 3.5–5.2)
Sodium: 138 mmol/L (ref 134–144)
eGFR: 79 mL/min/1.73

## 2021-02-21 LAB — CBC
Hematocrit: 43.7 % (ref 37.5–51.0)
Hemoglobin: 15.1 g/dL (ref 13.0–17.7)
MCH: 33.2 pg — ABNORMAL HIGH (ref 26.6–33.0)
MCHC: 34.6 g/dL (ref 31.5–35.7)
MCV: 96 fL (ref 79–97)
Platelets: 227 x10E3/uL (ref 150–450)
RBC: 4.55 x10E6/uL (ref 4.14–5.80)
RDW: 14.2 % (ref 11.6–15.4)
WBC: 5 x10E3/uL (ref 3.4–10.8)

## 2021-02-22 ENCOUNTER — Ambulatory Visit (HOSPITAL_COMMUNITY)
Admission: RE | Admit: 2021-02-22 | Discharge: 2021-02-22 | Disposition: A | Discharge status: Home / Self Care | Payer: BC Managed Care – PPO | Source: Ambulatory Visit | Attending: Cardiology | Admitting: Cardiology

## 2021-02-22 DIAGNOSIS — I4819 Other persistent atrial fibrillation: Secondary | ICD-10-CM

## 2021-02-22 MED ORDER — IOHEXOL 350 MG/ML SOLN
95.0000 mL | Freq: Once | INTRAVENOUS | Status: AC | PRN
  Administered 2021-02-22: 95 mL via INTRAVENOUS

## 2021-02-28 NOTE — Pre-Procedure Instructions (Signed)
Instructed patient on the following items: Arrival time 0530 Nothing to eat or drink after midnight No meds AM of procedure Responsible person to drive you home and stay with you for 24 hrs  Have you missed any doses of anti-coagulant Eliquis- hasn't missed any doses    

## 2021-03-01 ENCOUNTER — Ambulatory Visit (HOSPITAL_COMMUNITY): Payer: BC Managed Care – PPO

## 2021-03-01 ENCOUNTER — Ambulatory Visit (HOSPITAL_COMMUNITY): Payer: BC Managed Care – PPO | Admitting: Certified Registered Nurse Anesthetist

## 2021-03-01 ENCOUNTER — Ambulatory Visit (HOSPITAL_COMMUNITY)
Admission: RE | Admit: 2021-03-01 | Discharge: 2021-03-01 | Disposition: A | Discharge status: Home / Self Care | Payer: BC Managed Care – PPO | Source: Ambulatory Visit | Attending: Cardiology | Admitting: Cardiology

## 2021-03-01 ENCOUNTER — Other Ambulatory Visit: Payer: Self-pay

## 2021-03-01 ENCOUNTER — Encounter (HOSPITAL_COMMUNITY)
Admission: RE | Disposition: A | Discharge status: Home / Self Care | Payer: Self-pay | Source: Ambulatory Visit | Attending: Cardiology

## 2021-03-01 ENCOUNTER — Encounter (HOSPITAL_COMMUNITY): Payer: Self-pay | Admitting: Cardiology

## 2021-03-01 DIAGNOSIS — G4730 Sleep apnea, unspecified: Secondary | ICD-10-CM | POA: Insufficient documentation

## 2021-03-01 DIAGNOSIS — I484 Atypical atrial flutter: Secondary | ICD-10-CM | POA: Diagnosis not present

## 2021-03-01 DIAGNOSIS — I4819 Other persistent atrial fibrillation: Secondary | ICD-10-CM | POA: Insufficient documentation

## 2021-03-01 DIAGNOSIS — Z950 Presence of cardiac pacemaker: Secondary | ICD-10-CM | POA: Insufficient documentation

## 2021-03-01 DIAGNOSIS — I495 Sick sinus syndrome: Secondary | ICD-10-CM | POA: Diagnosis not present

## 2021-03-01 DIAGNOSIS — Z87891 Personal history of nicotine dependence: Secondary | ICD-10-CM | POA: Diagnosis not present

## 2021-03-01 HISTORY — PX: ATRIAL FIBRILLATION ABLATION: EP1191

## 2021-03-01 HISTORY — PX: TEE WITHOUT CARDIOVERSION: SHX5443

## 2021-03-01 LAB — POCT ACTIVATED CLOTTING TIME
Activated Clotting Time: 254 seconds
Activated Clotting Time: 277 seconds
Activated Clotting Time: 306 seconds

## 2021-03-01 SURGERY — ATRIAL FIBRILLATION ABLATION
Anesthesia: General

## 2021-03-01 MED ORDER — SODIUM CHLORIDE 0.9% FLUSH: 3.0000 mL | INTRAVENOUS | Status: DC | PRN

## 2021-03-01 MED ORDER — ACETAMINOPHEN 325 MG PO TABS
650.0000 mg | ORAL_TABLET | ORAL | Status: DC | PRN
  Filled 2021-03-01: qty 2

## 2021-03-01 MED ORDER — SODIUM CHLORIDE 0.9 % IV SOLN: INTRAVENOUS | Status: DC

## 2021-03-01 MED ORDER — HEPARIN SODIUM (PORCINE) 1000 UNIT/ML IJ SOLN
INTRAMUSCULAR | Status: DC | PRN
  Administered 2021-03-01 (×2): 6000 [IU] via INTRAVENOUS
  Administered 2021-03-01: 15000 [IU] via INTRAVENOUS

## 2021-03-01 MED ORDER — HEPARIN SODIUM (PORCINE) 1000 UNIT/ML IJ SOLN
INTRAMUSCULAR | Status: DC | PRN
  Administered 2021-03-01: 1000 [IU] via INTRAVENOUS

## 2021-03-01 MED ORDER — APIXABAN 5 MG PO TABS
5.0000 mg | ORAL_TABLET | Freq: Once | ORAL | Status: AC
  Administered 2021-03-01: 5 mg via ORAL
  Filled 2021-03-01 (×2): qty 1

## 2021-03-01 MED ORDER — ONDANSETRON HCL 4 MG/2ML IJ SOLN: 4.0000 mg | Freq: Four times a day (QID) | INTRAMUSCULAR | Status: DC | PRN

## 2021-03-01 MED ORDER — PROTAMINE SULFATE 10 MG/ML IV SOLN
INTRAVENOUS | Status: DC | PRN
  Administered 2021-03-01: 40 mg via INTRAVENOUS

## 2021-03-01 MED ORDER — SODIUM CHLORIDE 0.9% FLUSH: 3.0000 mL | Freq: Two times a day (BID) | INTRAVENOUS | Status: DC

## 2021-03-01 MED ORDER — HEPARIN SODIUM (PORCINE) 1000 UNIT/ML IJ SOLN
INTRAMUSCULAR | Status: AC
  Filled 2021-03-01: qty 1

## 2021-03-01 MED ORDER — SODIUM CHLORIDE 0.9 % IV SOLN: 250.0000 mL | INTRAVENOUS | Status: DC | PRN

## 2021-03-01 MED ORDER — PHENYLEPHRINE HCL-NACL 10-0.9 MG/250ML-% IV SOLN
INTRAVENOUS | Status: DC | PRN
  Administered 2021-03-01: 50 ug/min via INTRAVENOUS

## 2021-03-01 MED ORDER — LIDOCAINE 2% (20 MG/ML) 5 ML SYRINGE
INTRAMUSCULAR | Status: DC | PRN
  Administered 2021-03-01: 60 mg via INTRAVENOUS

## 2021-03-01 MED ORDER — SUGAMMADEX SODIUM 200 MG/2ML IV SOLN
INTRAVENOUS | Status: DC | PRN
  Administered 2021-03-01: 200 mg via INTRAVENOUS

## 2021-03-01 MED ORDER — ROCURONIUM BROMIDE 10 MG/ML (PF) SYRINGE
PREFILLED_SYRINGE | INTRAVENOUS | Status: DC | PRN
  Administered 2021-03-01: 50 mg via INTRAVENOUS
  Administered 2021-03-01: 40 mg via INTRAVENOUS

## 2021-03-01 MED ORDER — PHENYLEPHRINE 40 MCG/ML (10ML) SYRINGE FOR IV PUSH (FOR BLOOD PRESSURE SUPPORT)
PREFILLED_SYRINGE | INTRAVENOUS | Status: DC | PRN
  Administered 2021-03-01: 120 ug via INTRAVENOUS
  Administered 2021-03-01: 80 ug via INTRAVENOUS

## 2021-03-01 MED ORDER — HEPARIN (PORCINE) IN NACL 1000-0.9 UT/500ML-% IV SOLN
INTRAVENOUS | Status: DC | PRN
  Administered 2021-03-01 (×5): 500 mL

## 2021-03-01 MED ORDER — ONDANSETRON HCL 4 MG/2ML IJ SOLN
INTRAMUSCULAR | Status: DC | PRN
  Administered 2021-03-01: 4 mg via INTRAVENOUS

## 2021-03-01 MED ORDER — PROPOFOL 10 MG/ML IV BOLUS
INTRAVENOUS | Status: DC | PRN
  Administered 2021-03-01: 50 mg via INTRAVENOUS
  Administered 2021-03-01: 150 mg via INTRAVENOUS

## 2021-03-01 MED ORDER — FENTANYL CITRATE (PF) 100 MCG/2ML IJ SOLN
INTRAMUSCULAR | Status: DC | PRN
  Administered 2021-03-01: 100 ug via INTRAVENOUS

## 2021-03-01 MED ORDER — DEXAMETHASONE SODIUM PHOSPHATE 10 MG/ML IJ SOLN
INTRAMUSCULAR | Status: DC | PRN
  Administered 2021-03-01: 10 mg via INTRAVENOUS

## 2021-03-01 SURGICAL SUPPLY — 22 items
BAG SNAP BAND KOVER 36X36 (MISCELLANEOUS) ×3 IMPLANT
BLANKET WARM UNDERBOD FULL ACC (MISCELLANEOUS) ×3 IMPLANT
CATH MAPPNG PENTARAY F 2-6-2MM (CATHETERS) ×1 IMPLANT
CATH S CIRCA THERM PROBE 10F (CATHETERS) ×3 IMPLANT
CATH SMTCH THERMOCOOL SF DF (CATHETERS) ×3 IMPLANT
CATH SOUNDSTAR ECO 8FR (CATHETERS) ×3 IMPLANT
CATH WEB BI DIR CSDF CRV REPRO (CATHETERS) ×3 IMPLANT
CLOSURE PERCLOSE PROSTYLE (VASCULAR PRODUCTS) ×12 IMPLANT
COVER SWIFTLINK CONNECTOR (BAG) ×3 IMPLANT
KIT VERSACROSS STEERABLE D1 (CATHETERS) ×3 IMPLANT
MAT PREVALON FULL STRYKER (MISCELLANEOUS) ×3 IMPLANT
PACK EP LATEX FREE (CUSTOM PROCEDURE TRAY) ×3
PACK EP LF (CUSTOM PROCEDURE TRAY) ×1 IMPLANT
PAD PRO RADIOLUCENT 2001M-C (PAD) ×3 IMPLANT
PATCH CARTO3 (PAD) ×3 IMPLANT
PENTARAY F 2-6-2MM (CATHETERS) ×3
SHEATH CARTO VIZIGO SM CVD (SHEATH) ×3 IMPLANT
SHEATH PINNACLE 7F 10CM (SHEATH) ×3 IMPLANT
SHEATH PINNACLE 8F 10CM (SHEATH) ×6 IMPLANT
SHEATH PINNACLE 9F 10CM (SHEATH) ×3 IMPLANT
SHEATH PROBE COVER 6X72 (BAG) ×3 IMPLANT
TUBING SMART ABLATE COOLFLOW (TUBING) ×3 IMPLANT

## 2021-03-01 NOTE — Discharge Instructions (Signed)

## 2021-03-01 NOTE — Anesthesia Postprocedure Evaluation (Signed)
Anesthesia Post Note  Patient: Shane [name]  Procedure(s) Performed: ATRIAL FIBRILLATION ABLATION TRANSESOPHAGEAL ECHOCARDIOGRAM (TEE)     Patient location during evaluation: Cath Lab Anesthesia Type: General Level of consciousness: patient cooperative and awake Pain management: pain level controlled Vital Signs Assessment: post-procedure vital signs reviewed and stable Respiratory status: spontaneous breathing, nonlabored ventilation, respiratory function stable and patient connected to nasal cannula oxygen Cardiovascular status: blood pressure returned to baseline and stable Postop Assessment: no apparent nausea or vomiting Anesthetic complications: no   No notable events documented.  Last Vitals:  Vitals:   03/01/21 1300 03/01/21 1416  BP: (!) 102/45 104/65  Pulse: (!) 59 (!) 59  Resp: 15 15  Temp:    SpO2: 97% 97%    Last Pain:  Vitals:   03/01/21 1135  TempSrc:   PainSc: 0-No pain                 Rini Moffit

## 2021-03-01 NOTE — H&P (Signed)
Electrophysiology Office Note   Date:  03/01/2021   ID:  Shane, [name] 1945/10/28, MRN [id number]  PCP:  Albina Billet, MD  Cardiologist:  [place] Primary Electrophysiologist:  Kynisha Memon Meredith Leeds, MD    No chief complaint on file.    History of Present Illness: Shane [name] is a 75 y.o. male who is being seen today for the evaluation of atrial fibrillation at the request of No ref. provider found. Presenting today for electrophysiology evaluation.   He has a history of atrial fibrillation, sick sinus syndrome status post [employer] pacemaker, sleep apnea.  He is status post A. fib ablation 05/15/2017.  He had more episodes of atrial fibrillation, February 2021, June 2021, and August 2021.  He was started on amiodarone.  He developed increased chest congestion.  Review of his pacemaker indicated atrial fibrillation started around the time of his symptoms.  He is now status post repeat cardioversion 12/26/2020.  Today, denies symptoms of palpitations, chest pain, shortness of breath, orthopnea, PND, lower extremity edema, claudication, dizziness, presyncope, syncope, bleeding, or neurologic sequela. The patient is tolerating medications without difficulties.  Unfortunately he is back in atrial fibrillation today.  He does not have chest pain, but does endorse dyspnea on exertion and fatigue.  He feels that he is sleeping a little bit more than when he was in normal rhythm.  He can still do his daily activities, but has noticed that he is doing the much more slowly.  He is interested in getting back into normal rhythm.   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   guidant  [facility] [phone])   Sinus node dysfunction (HCC)    Sleep apnea    CPAP   Wears hearing aid in both ears    Past Surgical History:  Procedure  Laterality Date   ATRIAL FIBRILLATION ABLATION N/A 04/03/2017   Procedure: Atrial Fibrillation Ablation;  Surgeon: Constance Haw, MD;  Location: [place] [facility];  Service: Cardiovascular;  Laterality: N/A;   CARDIAC CATHETERIZATION  10/13/1994   No CAD   CARDIAC CATHETERIZATION  05/10/2003   No CAD   CARDIOVERSION N/A 10/21/2016   Procedure: CARDIOVERSION;  Surgeon: Sanda Klein, MD;  Location: [place];  Service: Cardiovascular;  Laterality: N/A;   CARDIOVERSION N/A 05/15/2017   Procedure: CARDIOVERSION;  Surgeon: Fay Records, MD;  Location: [place];  Service: Cardiovascular;  Laterality: N/A;   CARDIOVERSION N/A 10/21/2019   Procedure: CARDIOVERSION;  Surgeon: Sanda Klein, MD;  Location: [place];  Service: Cardiovascular;  Laterality: N/A;   CARDIOVERSION N/A 02/24/2020   Procedure: CARDIOVERSION;  Surgeon: Lelon Perla, MD;  Location: [facility];  Service: Cardiovascular;  Laterality: N/A;   CARDIOVERSION N/A 04/11/2020   Procedure: CARDIOVERSION;  Surgeon: Skeet Latch, MD;  Location: [place];  Service: Cardiovascular;  Laterality: N/A;   CARDIOVERSION N/A 12/26/2020   Procedure: CARDIOVERSION;  Surgeon: Sanda Klein, MD;  Location: [place];  Service: Cardiovascular;  Laterality: N/A;   CATARACT EXTRACTION W/PHACO Left 05/18/2019   Procedure: CATARACT EXTRACTION PHACO AND INTRAOCULAR LENS PLACEMENT ([place]) LEFT  1:21 11.0% 9.01;  Surgeon: Leandrew Koyanagi, MD;  Location: [place];  Service: Ophthalmology;  Laterality: Left;  sleep apnea   CATARACT EXTRACTION W/PHACO Right 06/29/2019   Procedure: CATARACT EXTRACTION PHACO AND INTRAOCULAR LENS  PLACEMENT ([facility]) RIGHT  01:14.2  14.5%  10.92;  Surgeon: Leandrew Koyanagi, MD;  Location: [place];  Service: Ophthalmology;  Laterality: Right;  sleep apnea   CYST EXCISION  03/2019   on back    NM MYOCAR PERF WALL MOTION  05/07/2011   Lexiscan: No ishcemia   PERMANENT PACEMAKER  INSERTION  03/12/2010   guidant   PPM GENERATOR CHANGEOUT N/A 08/06/2020   Procedure: PPM GENERATOR CHANGEOUT;  Surgeon: Sanda Klein, MD;  Location: [place] [facility];  Service: Cardiovascular;  Laterality: N/A;   ROTATOR CUFF REPAIR  1996   SHOULDER ARTHROSCOPY     US ECHOCARDIOGRAPHY  05/07/2011   mod. LVH,LA mod. dilated,borderline aortic root dilatation     Current Facility-Administered Medications  Medication Dose Route Frequency Provider Last Rate Last Admin   0.9 %  sodium chloride infusion   Intravenous Continuous Shaelin Lalley, Ocie Doyne, MD        Allergies:   Patient has no known allergies.   Social History:  The patient  reports that he quit smoking about 27 years ago. His smoking use included cigars. He quit smokeless tobacco use about 27 years ago.  His smokeless tobacco use included chew. He reports previous alcohol use. He reports that he does not use drugs.   Family History:  The patient's family history includes Alzheimer's disease in his father; Dementia in his sister; Heart disease in his sister; Heart failure in his mother; Stroke in his mother.   ROS:  Please see the history of present illness.   Otherwise, review of systems is positive for none.   All other systems are reviewed and negative.   PHYSICAL EXAM: VS:  BP 132/86   Pulse 68   Temp 98.2 F (36.8 C) (Oral)   Ht 5\' 11"  (1.803 m)   Wt 109.8 kg   SpO2 100%   BMI 33.75 kg/m  , BMI Body mass index is 33.75 kg/m. GEN: Well nourished, well developed, in no acute distress  HEENT: normal  Neck: no JVD, carotid bruits, or masses Cardiac: Irregular; no murmurs, rubs, or gallops,no edema  Respiratory:  clear to auscultation bilaterally, normal work of breathing GI: soft, nontender, nondistended, + BS MS: no deformity or atrophy  Skin: warm and dry, device site well healed Neuro:  Strength and sensation are intact Psych: euthymic mood, full affect  EKG:  EKG is ordered today. Personal review of the ekg  ordered shows atrial flutter, rate 83  Personal review of the device interrogation today. Results in [place]: 03/28/2020: ALT 22; TSH 4.669 02/21/2021: BUN 17; Creatinine, Ser 0.99; Hemoglobin 15.1; Platelets 227; Potassium 4.2; Sodium 138    Lipid Panel  No results found for: CHOL, TRIG, HDL, CHOLHDL, VLDL, LDLCALC, LDLDIRECT   Wt Readings from Last 3 Encounters:  03/01/21 109.8 kg  01/15/21 115.2 kg  12/26/20 117.9 kg      Other studies Reviewed: Additional studies/ records that were reviewed today include: TTE 11/06/16  Review of the above records today demonstrates:  - Left ventricle: Septal hypokinesis. The cavity size was   moderately dilated. Wall thickness was increased in a pattern of   mild LVH. Systolic function was normal. The estimated ejection   fraction was in the range of 50% to 55%. - Aortic valve: There was trivial regurgitation. - Left atrium: The atrium was mildly dilated.   ASSESSMENT AND PLAN:  1.  Persistent atrial fibrillation/atypical atrial flutter: Shane [name] has presented today for  surgery, with the diagnosis of AF/flutter.  The various methods of treatment have been discussed with the patient and family. After consideration of risks, benefits and other options for treatment, the patient has consented to  Procedure(s): Catheter ablation as a surgical intervention .  Risks include but not limited to complete heart block, stroke, esophageal damage, nerve damage, bleeding, vascular damage, tamponade, perforation, MI, and death. The patient's history has been reviewed, patient examined, no change in status, stable for surgery.  I have reviewed the patient's chart and labs.  Questions were answered to the patient's satisfaction.    Kasai Beltran Curt Bears, MD 03/01/2021 7:08 AM

## 2021-03-01 NOTE — Anesthesia Preprocedure Evaluation (Signed)
Anesthesia Evaluation  Patient identified by MRN, date of birth, ID band Patient awake    Reviewed: Allergy & Precautions, NPO status , Patient's Chart, lab work & pertinent test results  History of Anesthesia Complications Negative for: history of anesthetic complications  Airway Mallampati: III  TM Distance: >3 FB Neck ROM: Full    Dental  (+) Teeth Intact, Dental Advisory Given   Pulmonary neg shortness of breath, sleep apnea and Continuous Positive Airway Pressure Ventilation , neg COPD, neg recent URI, former smoker,  Covid-19 Nucleic Acid Test Results Lab Results      Component                Value               Date                      SARSCOV2NAA              NEGATIVE            12/24/2020                [place]              NEGATIVE            08/02/2020                [place]              NEGATIVE            08/02/2020                [place]              NEGATIVE            04/09/2020                [place]              NEGATIVE            02/22/2020              breath sounds clear to auscultation       Cardiovascular (-) hypertension(-) angina+CHF  (-) Past MI + dysrhythmias Atrial Fibrillation + pacemaker  Rhythm:Irregular Rate:Abnormal  . Abnormal GLS -9.2 septal and inferior wall hypokinesis . Left  ventricular ejection fraction, by estimation, is 45 to 50%. The left  ventricle has mildly decreased function. The left ventricle demonstrates  regional wall motion abnormalities (see  scoring diagram/findings for description). The left ventricular internal  cavity size was mildly dilated. There is severe left ventricular  hypertrophy. Left ventricular diastolic parameters were normal.  2. Right ventricular systolic function is normal. The right ventricular  size is normal.  3. Left atrial size was severely dilated.  4. The mitral valve is normal in structure. Mild mitral valve  regurgitation. No  evidence of mitral stenosis.  5. The aortic valve is tricuspid. Aortic valve regurgitation is trivial.  Mild aortic valve sclerosis is present, with no evidence of aortic valve  stenosis.  6. Aortic dilatation noted. There is mild dilatation of the aortic root  measuring 38 mm.  7. The inferior vena cava is normal in size with greater than 50%  respiratory variability, suggesting right atrial pressure of 3 mmHg.    Neuro/Psych negative neurological ROS  negative psych ROS   GI/Hepatic negative GI ROS, Neg liver ROS,   Endo/Other  negative endocrine ROSObesity   Renal/GU  negative Renal ROS     Musculoskeletal  (+) Arthritis ,   Abdominal   Peds  Hematology negative hematology ROS (+) Blood dyscrasia, , eliquis for afib   Anesthesia Other Findings Day of surgery medications reviewed with the patient.  Reproductive/Obstetrics                             Anesthesia Physical Anesthesia Plan  ASA: 3  Anesthesia Plan: General   Post-op Pain Management:    Induction: Intravenous  PONV Risk Score and Plan: 2 and Ondansetron and Dexamethasone  Airway Management Planned: Oral ETT  Additional Equipment: None  Intra-op Plan:   Post-operative Plan: Extubation in OR  Informed Consent: I have reviewed the patients History and Physical, chart, labs and discussed the procedure including the risks, benefits and alternatives for the proposed anesthesia with the patient or authorized representative who has indicated his/her understanding and acceptance.     Dental advisory given  Plan Discussed with: CRNA and Surgeon  Anesthesia Plan Comments:         Anesthesia Quick Evaluation

## 2021-03-01 NOTE — Transfer of Care (Signed)
Immediate Anesthesia Transfer of Care Note  Patient: Shane [name]  Procedure(s) Performed: ATRIAL FIBRILLATION ABLATION TRANSESOPHAGEAL ECHOCARDIOGRAM (TEE)  Patient Location: PACU  Anesthesia Type:General  Level of Consciousness: awake, alert , oriented and patient cooperative  Airway & Oxygen Therapy: Patient Spontanous Breathing and Patient connected to face mask oxygen  Post-op Assessment: Report given to RN, Post -op Vital signs reviewed and stable and Patient moving all extremities  Post vital signs: Reviewed and stable  Last Vitals:  Vitals Value Taken Time  BP 128/63 03/01/21 1049  Temp    Pulse 64 03/01/21 1051  Resp 15 03/01/21 1051  SpO2 98 % 03/01/21 1051  Vitals shown include unvalidated device data.  Last Pain:  Vitals:   03/01/21 0702  TempSrc:   PainSc: 0-No pain         Complications: No notable events documented.

## 2021-03-01 NOTE — Anesthesia Procedure Notes (Signed)
Procedure Name: Intubation Date/Time: 03/01/2021 7:55 AM Performed by: Myna Bright, CRNA Pre-anesthesia Checklist: Patient identified, Emergency Drugs available, Suction available and Patient being monitored Patient Re-evaluated:Patient Re-evaluated prior to induction Oxygen Delivery Method: Circle system utilized Preoxygenation: Pre-oxygenation with 100% oxygen Induction Type: IV induction Ventilation: Mask ventilation without difficulty and Oral airway inserted - appropriate to patient size Laryngoscope Size: Glidescope and 4 Tube type: Oral Tube size: 7.5 mm Number of attempts: 1 Airway Equipment and Method: Stylet and Video-laryngoscopy Placement Confirmation: ETT inserted through vocal cords under direct vision, positive ETCO2 and breath sounds checked- equal and bilateral Secured at: 22 cm Tube secured with: Tape Dental Injury: Teeth and Oropharynx as per pre-operative assessment  Difficulty Due To: Difficulty was anticipated, Difficult Airway- due to large tongue and Difficult Airway- due to limited oral opening Future Recommendations: Recommend- induction with short-acting agent, and alternative techniques readily available Comments: Easy mask with oral airway in place. Glidescope used d/t history of difficult intubation. Good view with glide and atraumatic oral intubation.

## 2021-03-01 NOTE — Interval H&P Note (Signed)
History and Physical Interval Note:  03/01/2021 7:51 AM  Shane [name]  has presented today for surgery, with the diagnosis of afib.  The various methods of treatment have been discussed with the patient and family. After consideration of risks, benefits and other options for treatment, the patient has consented to  Procedure(s): ATRIAL FIBRILLATION ABLATION (N/A) TRANSESOPHAGEAL ECHOCARDIOGRAM (TEE) (N/A) as a surgical intervention.  The patient's history has been reviewed, patient examined, no change in status, stable for surgery.  I have reviewed the patient's chart and labs.  Questions were answered to the patient's satisfaction.     Skeet Latch, MD

## 2021-03-01 NOTE — Progress Notes (Signed)
  Echocardiogram Echocardiogram Transesophageal has been performed.  Shane [name] 03/01/2021, 8:33 AM

## 2021-03-01 NOTE — CV Procedure (Signed)
Brief TEE Note  LVEF 50%.  Mild global hypokinesis PPM lead noted in the RV, RA Mild AR, TR.   Trivial MR/PR. No LA/LAA thrombus or masses +PFO by color flow Doppler 2PV on R and 2 PV on L  For additional details see full report.   Shane [name] C. Shane Linsey, MD, [facility] 03/01/2021 8:21 AM

## 2021-03-06 LAB — CUP PACEART REMOTE DEVICE CHECK
Battery Remaining Longevity: 108 mo
Battery Remaining Percentage: 100 %
Brady Statistic RA Percent Paced: 99 %
Brady Statistic RV Percent Paced: 20 %
Date Time Interrogation Session: 20220706000100
Implantable Lead Implant Date: 20041025
Implantable Lead Implant Date: 20041025
Implantable Lead Location: 753859
Implantable Lead Location: 753860
Implantable Lead Model: 4457
Implantable Lead Model: 4480
Implantable Lead Serial Number: 338209
Implantable Lead Serial Number: 424134
Implantable Pulse Generator Implant Date: 20211206
Lead Channel Impedance Value: 497 Ohm
Lead Channel Impedance Value: 528 Ohm
Lead Channel Setting Pacing Amplitude: 2.5 V
Lead Channel Setting Pacing Amplitude: 2.5 V
Lead Channel Setting Pacing Pulse Width: 0.4 ms
Lead Channel Setting Sensing Sensitivity: 3 mV
Pulse Gen Serial Number: 955727

## 2021-03-07 ENCOUNTER — Ambulatory Visit (INDEPENDENT_AMBULATORY_CARE_PROVIDER_SITE_OTHER): Payer: BC Managed Care – PPO

## 2021-03-07 DIAGNOSIS — I495 Sick sinus syndrome: Secondary | ICD-10-CM

## 2021-03-08 ENCOUNTER — Telehealth: Payer: Self-pay

## 2021-03-08 NOTE — Telephone Encounter (Signed)
Latitiude alert received- Pt is s/p AF ablation on 7/1.  Since ablation 10 breakthrough AT/AF events, longest episode lasting 1 hour 16min.  At time of transmission pt in At/ Afl, duration is unclear.    Spoke with pt, he denies any symptoms.  Advised it is not uncommon for breakthrough episodes during the healing period after ablation, the concern would be if he were symptomatic or the episode last a long time.  Since he is asymptomatic, we will continue to monitor.  [place] beach at time [place], he does have his monitor with him so he will send a manual transmission when he gets there for [place] confirm end of episode.  Advised pt, I would check for transmission on 7/11

## 2021-03-11 NOTE — Telephone Encounter (Signed)
F/U transmission received.  Pt does not appear to still be in AF/ Afl, however ? A-V disynchrony noted.  Please advise if any changes needed.

## 2021-03-11 NOTE — Telephone Encounter (Signed)
Spoke with pt.  He will send manual transmission next.  Scheduled transmission in [place] for 03/19/21.    Pt continues to feel good.  [place] at the beach.

## 2021-03-28 NOTE — Progress Notes (Signed)
Remote pacemaker transmission.   

## 2021-04-18 ENCOUNTER — Other Ambulatory Visit (HOSPITAL_COMMUNITY): Payer: Self-pay

## 2021-04-18 ENCOUNTER — Telehealth: Payer: Self-pay

## 2021-04-18 ENCOUNTER — Other Ambulatory Visit: Payer: Self-pay

## 2021-04-18 ENCOUNTER — Ambulatory Visit (HOSPITAL_COMMUNITY)
Admission: RE | Admit: 2021-04-18 | Discharge: 2021-04-18 | Disposition: A | Discharge status: Home / Self Care | Payer: BC Managed Care – PPO | Source: Ambulatory Visit | Attending: Nurse Practitioner | Admitting: Nurse Practitioner

## 2021-04-18 ENCOUNTER — Encounter (HOSPITAL_COMMUNITY): Payer: Self-pay | Admitting: Nurse Practitioner

## 2021-04-18 VITALS — BP 102/68 | HR 77 | Ht 71.0 in | Wt 243.6 lb

## 2021-04-18 DIAGNOSIS — D6869 Other thrombophilia: Secondary | ICD-10-CM | POA: Diagnosis not present

## 2021-04-18 DIAGNOSIS — Z7901 Long term (current) use of anticoagulants: Secondary | ICD-10-CM | POA: Insufficient documentation

## 2021-04-18 DIAGNOSIS — Z8249 Family history of ischemic heart disease and other diseases of the circulatory system: Secondary | ICD-10-CM | POA: Diagnosis not present

## 2021-04-18 DIAGNOSIS — I480 Paroxysmal atrial fibrillation: Secondary | ICD-10-CM | POA: Insufficient documentation

## 2021-04-18 DIAGNOSIS — Z79899 Other long term (current) drug therapy: Secondary | ICD-10-CM | POA: Insufficient documentation

## 2021-04-18 DIAGNOSIS — Z87891 Personal history of nicotine dependence: Secondary | ICD-10-CM | POA: Insufficient documentation

## 2021-04-18 DIAGNOSIS — I484 Atypical atrial flutter: Secondary | ICD-10-CM | POA: Diagnosis not present

## 2021-04-18 MED ORDER — AMIODARONE HCL 200 MG PO TABS: 200.0000 mg | ORAL_TABLET | Freq: Two times a day (BID) | ORAL | Status: DC

## 2021-04-18 MED ORDER — AMIODARONE HCL 200 MG PO TABS: 200.0000 mg | ORAL_TABLET | Freq: Two times a day (BID) | ORAL | 1 refills | Status: DC

## 2021-04-18 NOTE — Telephone Encounter (Signed)
Mandy from the [facility] let me know that the patient will be sending a manual transmission with his home monitor when he get home. She asked that we forward the transmission to Roderic Palau once we receive it. I told her I will keep an eye out for it and as soon as I see it I will let the nurse know.

## 2021-04-18 NOTE — Patient Instructions (Addendum)
Increase Amiodarone '200mg'$ - Take one tablet by mouth twice daily

## 2021-04-18 NOTE — Progress Notes (Signed)
Primary Care Physician: Albina Billet, MD Referring Physician: Dr. 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 male with a h/o PPM,  paroxysmal afib, s/p ablation in 2018 and being seen in f/u in afib clinic for return to atrial flutter. He was seen in the office by Dr. Loletha Grayer and he attempted to pace him out which was unsuccessful. On f/u here if he was still in out of rhythm, cardioversion was to be scheduled. Ekg shows atrial flutter at 118 bpm. .  He continues on eliquis 5 mg bid for CHA2DS2VASc score of 4. He is mildly symptomatic.  F/u in afib clinic, 7/6, one week s/p successful  cardioversion.  Unfortunately, he is in atrial flutter at a rate around 150 bpm and mildly  hypotensive. Pt feels slightly lightheaded and mild fatgiue. He has felt ok for the last several days and has not checked his HR. Dr. Recardo [name] in and attempted overdrive pacing, see his note above. Unfortunately, he did not return  to SR but to a slower afib at a rate of 90 bpm.  Bp came up into the [id number] systolic with pt feeling better. Denies being lightheaded now. He had a prior afib  ablation 04/2017. After discussion with Dr. Loletha Grayer. He will be started on amiodarone since BP is a concern.   F/u 7/8. He remains in a flutter by EKG at 102 bpm, but by pulse ox HR is in the 80's to low 90's. He had an episode of sweating after his shower this am but sat under the fan to cool off. He is fatigued. He continues on amiodarone 200 mg bid. He wanted to go to the beach today but I would feel better if he would  wait a week.   F/u 03/28/20. He continues in rate controlled afib. Still loading on amiodarone. He will be on amiodarone x 4 weeks on 8/2 and will eligible for cardioversion after that date. He wishes to  go back to the beach next  week and wants to set this up for the week of August 9 th.   F/u in afib clinic 04/18/21. Since I last saw pt a year ago, he has had more issues with afib and one month ago had a repeat  ablation. He is here for one month f/u. Unfortunately, he is a rate controlled atrial flutter. He has not seen any improvement from this ablation although he is able to do his activities without any fatigue or unusual fatigue. I discussed with Dr. Curt Bears and he would like him to increase amiodarone and see back in the next 2 weeks to see if this can convert pt. I will also ask the pt to send a device report to see if he is persistent or paroxysmal.   Today, he denies symptoms of palpitations, chest pain lightheadedness, no   shortness of breath, orthopnea, PND, lower extremity edema, dizziness, presyncope, syncope, or neurologic sequela.+ fatigue. The patient is tolerating medications without difficulties and is otherwise without complaint today.   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   guidant  [facility] [employer] [phone])   Sinus node dysfunction (HCC)    Sleep apnea    CPAP   Wears hearing aid in both ears    Past Surgical History:  Procedure Laterality Date  ATRIAL FIBRILLATION ABLATION N/A 04/03/2017   Procedure: Atrial Fibrillation Ablation;  Surgeon: Constance Haw, MD;  Location: [place] [facility];  Service: Cardiovascular;  Laterality: N/A;   ATRIAL FIBRILLATION ABLATION N/A 03/01/2021   Procedure: ATRIAL FIBRILLATION ABLATION;  Surgeon: Constance Haw, MD;  Location: [place] [facility];  Service: Cardiovascular;  Laterality: N/A;   CARDIAC CATHETERIZATION  10/13/1994   No CAD   CARDIAC CATHETERIZATION  05/10/2003   No CAD   CARDIOVERSION N/A 10/21/2016   Procedure: CARDIOVERSION;  Surgeon: Sanda Klein, MD;  Location: [place];  Service: Cardiovascular;  Laterality: N/A;   CARDIOVERSION N/A 05/15/2017   Procedure: CARDIOVERSION;  Surgeon: Fay Records, MD;  Location: [place];  Service: Cardiovascular;   Laterality: N/A;   CARDIOVERSION N/A 10/21/2019   Procedure: CARDIOVERSION;  Surgeon: Sanda Klein, MD;  Location: [place];  Service: Cardiovascular;  Laterality: N/A;   CARDIOVERSION N/A 02/24/2020   Procedure: CARDIOVERSION;  Surgeon: Lelon Perla, MD;  Location: [facility] ENDOSCOPY;  Service: Cardiovascular;  Laterality: N/A;   CARDIOVERSION N/A 04/11/2020   Procedure: CARDIOVERSION;  Surgeon: Skeet Latch, MD;  Location: [place];  Service: Cardiovascular;  Laterality: N/A;   CARDIOVERSION N/A 12/26/2020   Procedure: CARDIOVERSION;  Surgeon: Sanda Klein, MD;  Location: [place];  Service: Cardiovascular;  Laterality: N/A;   CATARACT EXTRACTION W/PHACO Left 05/18/2019   Procedure: CATARACT EXTRACTION PHACO AND INTRAOCULAR LENS PLACEMENT ([place]) LEFT  1:21 11.0% 9.01;  Surgeon: Leandrew Koyanagi, MD;  Location: [place];  Service: Ophthalmology;  Laterality: Left;  sleep apnea   CATARACT EXTRACTION W/PHACO Right 06/29/2019   Procedure: CATARACT EXTRACTION PHACO AND INTRAOCULAR LENS PLACEMENT ([facility]) RIGHT  01:14.2  14.5%  10.92;  Surgeon: Leandrew Koyanagi, MD;  Location: [place];  Service: Ophthalmology;  Laterality: Right;  sleep apnea   CYST EXCISION  03/2019   on back    NM MYOCAR PERF WALL MOTION  05/07/2011   Lexiscan: No ishcemia   PERMANENT PACEMAKER INSERTION  03/12/2010   guidant   PPM GENERATOR CHANGEOUT N/A 08/06/2020   Procedure: PPM GENERATOR CHANGEOUT;  Surgeon: Sanda Klein, MD;  Location: [place] CV LAB;  Service: Cardiovascular;  Laterality: N/A;   ROTATOR CUFF REPAIR  1996   SHOULDER ARTHROSCOPY     TEE WITHOUT CARDIOVERSION N/A 03/01/2021   Procedure: TRANSESOPHAGEAL ECHOCARDIOGRAM (TEE);  Surgeon: Constance Haw, MD;  Location: [place] CV LAB;  Service: Cardiovascular;  Laterality: N/A;   US ECHOCARDIOGRAPHY  05/07/2011   mod. LVH,LA mod. dilated,borderline aortic root dilatation    Current Outpatient Medications   Medication Sig Dispense Refill   acetaminophen (TYLENOL) 650 MG CR tablet Take 1,300 mg by mouth every 8 (eight) hours as needed for pain.     allopurinol (ZYLOPRIM) 300 MG tablet Take 1 tablet (300 mg total) by mouth daily. 30 tablet 2   Apoaequorin (PREVAGEN PO) Take 1 tablet by mouth daily.     ARTIFICIAL TEAR SOLUTION OP Place 1 drop into both eyes daily as needed (dry eyes).     Ascorbic Acid (VITAMIN C) 1000 MG tablet Take 1,000 mg by mouth daily.     Cholecalciferol (VITAMIN D-3 PO) Take 2,500 Units by mouth at bedtime.     citalopram (CELEXA) 40 MG tablet Take 1 tablet (40 mg total) by mouth daily. MUST RECEIVE FUTURE REFILLS FROM PCP. 30 tablet 0   colchicine 0.6 MG tablet Take 0.5 tablets (0.3 mg total) by mouth daily as needed. 30 tablet 2  diltiazem (CARDIZEM CD) 120 MG 24 hr capsule Take 1 capsule (120 mg total) by mouth daily. 90 capsule 1   ELIQUIS 5 MG TABS tablet Take 1 tablet (5 mg total) by mouth 2 (two) times daily. 180 tablet 1   fluticasone (FLONASE) 50 MCG/ACT nasal spray Place 2 sprays into both nostrils daily as needed for rhinitis.     furosemide (LASIX) 40 MG tablet Take 1.5 tablets (60 mg total) by mouth daily. 45 tablet 11   methylcellulose oral powder Take 15 mLs by mouth daily with supper.     montelukast (SINGULAIR) 10 MG tablet Take 10 mg by mouth at bedtime.     Multiple Vitamin (MULTIVITAMIN WITH MINERALS) TABS tablet Take 1 tablet by mouth daily. Senior     Omega-3 Fatty Acids (FISH OIL) 1200 MG CAPS Take 1,200 mg by mouth 2 (two) times daily.      Phenylephrine-APAP-guaiFENesin (MUCINEX SINUS-MAX CONG & PAIN PO) Take 2 tablets by mouth daily as needed (sinus headaches).      potassium chloride SA (KLOR-CON) 20 MEQ tablet Take 1 tablet (20 mEq total) by mouth daily. 90 tablet 3   pyridOXINE (VITAMIN B-6) 100 MG tablet Take 100 mg by mouth daily.     sodium chloride (OCEAN) 0.65 % SOLN nasal spray Place 1 spray into both nostrils daily as needed for  congestion.     tamsulosin (FLOMAX) 0.4 MG CAPS capsule Take 1 capsule (0.4 mg total) by mouth daily. 30 capsule 11   Testosterone 1.62 % GEL Apply 3 Pump topically daily. Shoulders  1   vitamin B-12 (CYANOCOBALAMIN) 1000 MCG tablet Take 1,000 mcg by mouth daily.     zinc gluconate 50 MG tablet Take 50 mg by mouth daily.     amiodarone (PACERONE) 200 MG tablet Take 1 tablet (200 mg total) by mouth in the morning and at bedtime. 60 tablet 1   metoprolol tartrate (LOPRESSOR) 100 MG tablet Take 1 tablet (100 mg total) by mouth once for 1 dose. Please take one time dose '100mg'$  metoprolol tartrate 2 hr prior to cardiac CT for HR control IF HR >55bpm. 1 tablet 0   No current facility-administered medications for this encounter.    No Known Allergies  Social History   Socioeconomic History   Marital status: Married    Spouse name: Not on file   Number of children: Not on file   Years of education: Not on file   Highest education level: Not on file  Occupational History   Not on file  Tobacco Use   Smoking status: Former    Types: Cigars    Quit date: 05/11/1993    Years since quitting: 27.9   Smokeless tobacco: Former    Types: Chew    Quit date: 05/11/1993  Vaping Use   Vaping Use: Never used  Substance and Sexual Activity   Alcohol use: Not Currently    Comment: social   Drug use: No   Sexual activity: Not on file  Other Topics Concern   Not on file  Social History Narrative   Not on file   Social Determinants of Health   Financial Resource Strain: Not on file  Food Insecurity: Not on file  Transportation Needs: Not on file  Physical Activity: Not on file  Stress: Not on file  Social Connections: Not on file  Intimate Partner Violence: Not on file    Family History  Problem Relation Age of Onset   Stroke Mother  Heart failure Mother    Alzheimer's disease Father    Heart disease Sister    Dementia Sister     ROS- All systems are reviewed and negative except as  per the HPI above  Physical Exam: Vitals:   04/18/21 0912  BP: 102/68  Pulse: 77  Weight: 110.5 kg  Height: '5\' 11"'$  (1.803 m)   Wt Readings from Last 3 Encounters:  04/18/21 110.5 kg  03/01/21 109.8 kg  01/15/21 115.2 kg    Labs: Lab Results  Component Value Date   NA 138 02/21/2021   K 4.2 02/21/2021   CL 101 02/21/2021   CO2 31 (H) 02/21/2021   GLUCOSE 88 02/21/2021   BUN 17 02/21/2021   CREATININE 0.99 02/21/2021   CALCIUM 9.0 02/21/2021   MG 2.0 04/22/2017   Lab Results  Component Value Date   INR 1.1 10/14/2016   No results found for: CHOL, HDL, LDLCALC, TRIG   GEN- The patient is well appearing, alert and oriented x 3 today.   Head- normocephalic, atraumatic Eyes-  Sclera clear, conjunctiva pink Ears- hearing intact Oropharynx- clear Neck- supple, no JVP Lymph- no cervical lymphadenopathy Lungs- Clear to ausculation bilaterally, normal work of breathing Heart-  irregular rate and rhythm, no murmurs, rubs or gallops, PMI not laterally displaced GI- soft, NT, ND, + BS Extremities- no clubbing, cyanosis, or edema MS- no significant deformity or atrophy Skin- no rash or lesion Psych- euthymic mood, full affect Neuro- strength and sensation are intact  EKG- atrial flutter at 77 bpm, qrs int 126 ms, qtc 482  ms   Assessment and Plan: 1. Afib  S/p ablation in 2018 and repeat ablation again one month ago Appears to be in atrial flutter rate controlled  Discussed with Dr. Curt Bears He would like to increase amiodarone to 200 mg bid  I will ask pt to send a device report to see if persistent  or paroxysmal  Continue  eliquis 5 mg bid for CHA2DS2VASc score of 4 Continue diltiazem 120 mg qd   2. CHA2DS2VASc score of 4 Continue  eiquis 5 mg bid, no missed doses  F/u in afib clinic 8/29 with Adline Peals, PA  If remains persistent, consider cardioversion    Shane [name], [facility] [address] [place], [place]  [address] [phone]

## 2021-04-18 NOTE — Telephone Encounter (Signed)
Transmission received.

## 2021-04-18 NOTE — Telephone Encounter (Signed)
Requested transmission sending to AF clinic as requested. Presenting appears AT with peak AR 157 bpm.

## 2021-04-29 ENCOUNTER — Encounter (HOSPITAL_COMMUNITY): Payer: Self-pay | Admitting: Physician Assistant

## 2021-04-29 ENCOUNTER — Ambulatory Visit (HOSPITAL_COMMUNITY)
Admission: RE | Admit: 2021-04-29 | Discharge: 2021-04-29 | Disposition: A | Discharge status: Home / Self Care | Payer: BC Managed Care – PPO | Source: Ambulatory Visit | Attending: Physician Assistant | Admitting: Physician Assistant

## 2021-04-29 ENCOUNTER — Other Ambulatory Visit: Payer: Self-pay

## 2021-04-29 VITALS — BP 102/68 | HR 77 | Ht 71.0 in | Wt 241.6 lb

## 2021-04-29 DIAGNOSIS — I495 Sick sinus syndrome: Secondary | ICD-10-CM | POA: Insufficient documentation

## 2021-04-29 DIAGNOSIS — I4819 Other persistent atrial fibrillation: Secondary | ICD-10-CM | POA: Diagnosis present

## 2021-04-29 DIAGNOSIS — G4733 Obstructive sleep apnea (adult) (pediatric): Secondary | ICD-10-CM | POA: Insufficient documentation

## 2021-04-29 DIAGNOSIS — I4892 Unspecified atrial flutter: Secondary | ICD-10-CM | POA: Diagnosis not present

## 2021-04-29 DIAGNOSIS — Z7901 Long term (current) use of anticoagulants: Secondary | ICD-10-CM | POA: Diagnosis not present

## 2021-04-29 DIAGNOSIS — Z87891 Personal history of nicotine dependence: Secondary | ICD-10-CM | POA: Diagnosis not present

## 2021-04-29 DIAGNOSIS — D6869 Other thrombophilia: Secondary | ICD-10-CM | POA: Insufficient documentation

## 2021-04-29 DIAGNOSIS — Z950 Presence of cardiac pacemaker: Secondary | ICD-10-CM | POA: Diagnosis not present

## 2021-04-29 DIAGNOSIS — Z79899 Other long term (current) drug therapy: Secondary | ICD-10-CM | POA: Insufficient documentation

## 2021-04-29 LAB — COMPREHENSIVE METABOLIC PANEL
ALT: 27 U/L (ref 0–44)
AST: 27 U/L (ref 15–41)
Albumin: 3.7 g/dL (ref 3.5–5.0)
Alkaline Phosphatase: 54 U/L (ref 38–126)
Anion gap: 7 (ref 5–15)
BUN: 19 mg/dL (ref 8–23)
CO2: 29 mmol/L (ref 22–32)
Calcium: 9 mg/dL (ref 8.9–10.3)
Chloride: 101 mmol/L (ref 98–111)
Creatinine, Ser: 1.11 mg/dL (ref 0.61–1.24)
GFR, Estimated: >60 mL/min (ref >60)
Glucose, Bld: 80 mg/dL (ref 70–99)
Potassium: 4.7 mmol/L (ref 3.5–5.1)
Sodium: 137 mmol/L (ref 135–145)
Total Bilirubin: 0.9 mg/dL (ref 0.3–1.2)
Total Protein: 6.8 g/dL (ref 6.5–8.1)

## 2021-04-29 LAB — TSH: TSH: 3.633 u[IU]/mL (ref 0.350–4.500)

## 2021-04-29 NOTE — Progress Notes (Signed)
Primary Care Physician: Albina Billet, MD Referring Physician: Dr. 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 He was seen in the office by Dr. Loletha Grayer and he attempted to pace him out which was unsuccessful. On f/u here if he was still in out of rhythm, cardioversion was to be scheduled. Ekg shows atrial flutter at 118 bpm. .  He continues on eliquis 5 mg bid for CHA2DS2VASc score of 4. He is mildly symptomatic.  F/u in afib clinic, 7/6, one week s/p successful  cardioversion.  Unfortunately, he is in atrial flutter at a rate around 150 bpm and mildly  hypotensive. Pt feels slightly lightheaded and mild fatgiue. He has felt ok for the last several days and has not checked his HR. Dr. Recardo Evangelist in and attempted overdrive pacing, see his note above. Unfortunately, he did not return  to SR but to a slower afib at a rate of 90 bpm.  Bp came up into the [id number] systolic with pt feeling better. Denies being lightheaded now. He had a prior afib  ablation 04/2017. After discussion with Dr. Loletha Grayer. He will be started on amiodarone since BP is a concern.   F/u 7/8. He remains in a flutter by EKG at 102 bpm, but by pulse ox HR is in the 80's to low 90's. He had an episode of sweating after his shower this am but sat under the fan to cool off. He is fatigued. He continues on amiodarone 200 mg bid. He wanted to go to the beach today but I would feel better if he would  wait a week.   F/u 03/28/20. He continues in rate controlled afib. Still loading on amiodarone. He will be on amiodarone x 4 weeks on 8/2 and will eligible for cardioversion after that date. He wishes to  go back to the beach next  week and wants to set this up for the week of August 9 th.   F/u in afib clinic 04/18/21. Since I last saw pt a year ago, he has had more issues with afib and one month ago had a repeat  ablation. He is here for one month f/u. Unfortunately, he is a rate controlled atrial flutter. He has not seen any improvement from this ablation although he is able to do his activities without any fatigue or unusual fatigue. I discussed with Dr. Curt Bears and he would like him to increase amiodarone and see back in the next 2 weeks to see if this can convert pt. I will also ask the pt to send a device report to see if he is persistent or paroxysmal.   Follow up in the AF clinic 04/29/21. Patient remains in rate controlled atrial flutter. Patient states that he feels about the same with no change in his energy level. He denies any bleeding issues on anticoagulation.   Today, he denies symptoms of palpitations, chest pain lightheadedness, no   shortness of breath, orthopnea, PND, lower extremity edema, dizziness, presyncope, syncope, or neurologic sequela.+ fatigue. The patient is tolerating medications without difficulties and is otherwise without complaint today.   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   guidant  (  [employer] [phone])   Sinus node dysfunction (HCC)    Sleep apnea    CPAP   Wears hearing aid in both ears    Past Surgical History:  Procedure Laterality Date   ATRIAL FIBRILLATION ABLATION N/A 04/03/2017   Procedure: Atrial Fibrillation Ablation;  Surgeon: Constance Haw, MD;  Location: [place] [facility] LAB;  Service: Cardiovascular;  Laterality: N/A;   ATRIAL FIBRILLATION ABLATION N/A 03/01/2021   Procedure: ATRIAL FIBRILLATION ABLATION;  Surgeon: Constance Haw, MD;  Location: [place] [facility] LAB;  Service: Cardiovascular;  Laterality: N/A;   CARDIAC CATHETERIZATION  10/13/1994   No CAD   CARDIAC CATHETERIZATION  05/10/2003   No CAD   CARDIOVERSION N/A 10/21/2016   Procedure: CARDIOVERSION;  Surgeon: Sanda Klein,  MD;  Location: [place];  Service: Cardiovascular;  Laterality: N/A;   CARDIOVERSION N/A 05/15/2017   Procedure: CARDIOVERSION;  Surgeon: Fay Records, MD;  Location: [place];  Service: Cardiovascular;  Laterality: N/A;   CARDIOVERSION N/A 10/21/2019   Procedure: CARDIOVERSION;  Surgeon: Sanda Klein, MD;  Location: [place];  Service: Cardiovascular;  Laterality: N/A;   CARDIOVERSION N/A 02/24/2020   Procedure: CARDIOVERSION;  Surgeon: Lelon Perla, MD;  Location: [facility] ENDOSCOPY;  Service: Cardiovascular;  Laterality: N/A;   CARDIOVERSION N/A 04/11/2020   Procedure: CARDIOVERSION;  Surgeon: Skeet Latch, MD;  Location: [place];  Service: Cardiovascular;  Laterality: N/A;   CARDIOVERSION N/A 12/26/2020   Procedure: CARDIOVERSION;  Surgeon: Sanda Klein, MD;  Location: [place];  Service: Cardiovascular;  Laterality: N/A;   CATARACT EXTRACTION W/PHACO Left 05/18/2019   Procedure: CATARACT EXTRACTION PHACO AND INTRAOCULAR LENS PLACEMENT ([place]) LEFT  1:21 11.0% 9.01;  Surgeon: Leandrew Koyanagi, MD;  Location: [place];  Service: Ophthalmology;  Laterality: Left;  sleep apnea   CATARACT EXTRACTION W/PHACO Right 06/29/2019   Procedure: CATARACT EXTRACTION PHACO AND INTRAOCULAR LENS PLACEMENT ([facility]) RIGHT  01:14.2  14.5%  10.92;  Surgeon: Leandrew Koyanagi, MD;  Location: [place];  Service: Ophthalmology;  Laterality: Right;  sleep apnea   CYST EXCISION  03/2019   on back    NM MYOCAR PERF WALL MOTION  05/07/2011   Lexiscan: No ishcemia   PERMANENT PACEMAKER INSERTION  03/12/2010   guidant   PPM GENERATOR CHANGEOUT N/A 08/06/2020   Procedure: PPM GENERATOR CHANGEOUT;  Surgeon: Sanda Klein, MD;  Location: [place] CV LAB;  Service: Cardiovascular;  Laterality: N/A;   ROTATOR CUFF REPAIR  1996   SHOULDER ARTHROSCOPY     TEE WITHOUT CARDIOVERSION N/A 03/01/2021   Procedure: TRANSESOPHAGEAL ECHOCARDIOGRAM (TEE);  Surgeon: Constance Haw, MD;  Location: [place] [facility] LAB;  Service: Cardiovascular;  Laterality: N/A;   US ECHOCARDIOGRAPHY  05/07/2011   mod. LVH,LA mod. dilated,borderline aortic root dilatation    Current Outpatient Medications  Medication Sig Dispense Refill   acetaminophen (TYLENOL) 650 MG CR tablet Take 1,300 mg by mouth every 8 (eight) hours as needed for pain.     allopurinol (ZYLOPRIM) 300 MG tablet Take 1 tablet (300 mg total) by mouth daily. 30 tablet 2   amiodarone (PACERONE) 200 MG tablet Take 1 tablet (200 mg total) by mouth in the morning and at bedtime. 60 tablet 1   Apoaequorin (PREVAGEN PO) Take 1 tablet by mouth daily.     ARTIFICIAL TEAR SOLUTION OP Place 1 drop into both eyes daily as needed (dry eyes).     Ascorbic Acid (VITAMIN C) 1000 MG tablet Take 1,000 mg by mouth daily.  Cholecalciferol (VITAMIN D-3 PO) Take 2,500 Units by mouth at bedtime.     citalopram (CELEXA) 40 MG tablet Take 1 tablet (40 mg total) by mouth daily. MUST RECEIVE FUTURE REFILLS FROM PCP. 30 tablet 0   colchicine 0.6 MG tablet Take 0.5 tablets (0.3 mg total) by mouth daily as needed. 30 tablet 2   diltiazem (CARDIZEM CD) 120 MG 24 hr capsule Take 1 capsule (120 mg total) by mouth daily. 90 capsule 1   ELIQUIS 5 MG TABS tablet Take 1 tablet (5 mg total) by mouth 2 (two) times daily. 180 tablet 1   fluticasone (FLONASE) 50 MCG/ACT nasal spray Place 2 sprays into both nostrils daily as needed for rhinitis.     furosemide (LASIX) 40 MG tablet Take 1.5 tablets (60 mg total) by mouth daily. 45 tablet 11   methylcellulose oral powder Take 15 mLs by mouth daily with supper.     montelukast (SINGULAIR) 10 MG tablet Take 10 mg by mouth at bedtime.     Multiple Vitamin (MULTIVITAMIN WITH MINERALS) TABS tablet Take 1 tablet by mouth daily. Senior     Omega-3 Fatty Acids (FISH OIL) 1200 MG CAPS Take 1,200 mg by mouth 2 (two) times daily.      Phenylephrine-APAP-guaiFENesin (MUCINEX SINUS-MAX CONG & PAIN PO) Take 2 tablets  by mouth daily as needed (sinus headaches).      potassium chloride SA (KLOR-CON) 20 MEQ tablet Take 1 tablet (20 mEq total) by mouth daily. 90 tablet 3   pyridOXINE (VITAMIN B-6) 100 MG tablet Take 100 mg by mouth daily.     sodium chloride (OCEAN) 0.65 % SOLN nasal spray Place 1 spray into both nostrils daily as needed for congestion.     tamsulosin (FLOMAX) 0.4 MG CAPS capsule Take 1 capsule (0.4 mg total) by mouth daily. 30 capsule 11   Testosterone 1.62 % GEL Apply 3 Pump topically daily. Shoulders  1   vitamin B-12 (CYANOCOBALAMIN) 1000 MCG tablet Take 1,000 mcg by mouth daily.     zinc gluconate 50 MG tablet Take 50 mg by mouth daily.     metoprolol tartrate (LOPRESSOR) 100 MG tablet Take 1 tablet (100 mg total) by mouth once for 1 dose. Please take one time dose '100mg'$  metoprolol tartrate 2 hr prior to cardiac CT for HR control IF HR >55bpm. 1 tablet 0   No current facility-administered medications for this encounter.    No Known Allergies  Social History   Socioeconomic History   Marital status: Married    Spouse name: Not on file   Number of children: Not on file   Years of education: Not on file   Highest education level: Not on file  Occupational History   Not on file  Tobacco Use   Smoking status: Former    Types: Cigars    Quit date: 05/11/1993    Years since quitting: 27.9   Smokeless tobacco: Former    Types: Chew    Quit date: 05/11/1993   Tobacco comments:    Former smoker 04/29/21  Vaping Use   Vaping Use: Never used  Substance and Sexual Activity   Alcohol use: Not Currently    Comment: social   Drug use: No   Sexual activity: Not on file  Other Topics Concern   Not on file  Social History Narrative   Not on file   Social Determinants of Health   Financial Resource Strain: Not on file  Food Insecurity: Not on file  Transportation  Needs: Not on file  Physical Activity: Not on file  Stress: Not on file  Social Connections: Not on file  Intimate  Partner Violence: Not on file    Family History  Problem Relation Age of Onset   Stroke Mother    Heart failure Mother    Alzheimer's disease Father    Heart disease Sister    Dementia Sister     ROS- All systems are reviewed and negative except as per the HPI above  Physical Exam: Vitals:   04/29/21 0930  BP: 102/68  Pulse: 77  Weight: 109.6 kg  Height: '5\' 11"'$  (1.803 m)    Wt Readings from Last 3 Encounters:  04/29/21 109.6 kg  04/18/21 110.5 kg  03/01/21 109.8 kg    Labs: Lab Results  Component Value Date   NA 138 02/21/2021   K 4.2 02/21/2021   CL 101 02/21/2021   CO2 31 (H) 02/21/2021   GLUCOSE 88 02/21/2021   BUN 17 02/21/2021   CREATININE 0.99 02/21/2021   CALCIUM 9.0 02/21/2021   MG 2.0 04/22/2017   Lab Results  Component Value Date   INR 1.1 10/14/2016   No results found for: CHOL, HDL, LDLCALC, TRIG  GEN- The patient is a well appearing obese elderly male, alert and oriented x 3 today.   HEENT-head normocephalic, atraumatic, sclera clear, conjunctiva pink, hearing intact, trachea midline. Lungs- Clear to ausculation bilaterally, normal work of breathing Heart- Regular rate and rhythm, no murmurs, rubs or gallops  GI- soft, NT, ND, + BS Extremities- no clubbing, cyanosis, or edema MS- no significant deformity or atrophy Skin- no rash or lesion Psych- euthymic mood, full affect Neuro- strength and sensation are intact   EKG- atypical atrial flutter with V pacing Vent. rate 77 BPM PR interval 138 ms QRS duration 220 ms QT/QTcB 500/565 ms   Assessment and Plan: 1. Persistent Afib/atrial flutter  S/p ablation in 2018 and repeat ablation 03/01/21 Patient remains in atrial flutter. We discussed therapeutic options today including DCCV. Patient declines DCCV today. He would like to wait until his visit with Dr Sallyanne Kuster before considering DCCV. Continue amiodarone 200 mg BID for now. Check cmet/TSH for amiodarone monitoring.  Continue  eliquis 5  mg BID Continue diltiazem 120 mg qd   2. CHA2DS2VASc score of 4 Continue  eiquis 5 mg bid  3. Tachybradycardia syndrome S/p PPM, followed by Dr Curt Bears and the device clinic.  4. OSA The importance of adequate treatment of sleep apnea was discussed today in order to improve our ability to maintain sinus rhythm long term. Encouraged compliance with CPAP therapy.   Follow up with Dr Sallyanne Kuster and Dr Curt Bears as scheduled.    [facility] [address] [place], [place] [address] [phone]

## 2021-05-27 ENCOUNTER — Encounter: Payer: BC Managed Care – PPO | Admitting: Cardiovascular Disease

## 2021-06-06 ENCOUNTER — Ambulatory Visit (INDEPENDENT_AMBULATORY_CARE_PROVIDER_SITE_OTHER): Payer: BC Managed Care – PPO

## 2021-06-06 DIAGNOSIS — I495 Sick sinus syndrome: Secondary | ICD-10-CM

## 2021-06-06 LAB — CUP PACEART REMOTE DEVICE CHECK
Battery Remaining Longevity: 126 mo
Battery Remaining Percentage: 100 %
Brady Statistic RA Percent Paced: 6 %
Brady Statistic RV Percent Paced: 79 %
Date Time Interrogation Session: 20221006000300
Implantable Lead Implant Date: 20041025
Implantable Lead Implant Date: 20041025
Implantable Lead Location: 753859
Implantable Lead Location: 753860
Implantable Lead Model: 4457
Implantable Lead Model: 4480
Implantable Lead Serial Number: 338209
Implantable Lead Serial Number: 424134
Implantable Pulse Generator Implant Date: 20211206
Lead Channel Impedance Value: 518 Ohm
Lead Channel Impedance Value: 549 Ohm
Lead Channel Setting Pacing Amplitude: 2.5 V
Lead Channel Setting Pacing Amplitude: 2.5 V
Lead Channel Setting Pacing Pulse Width: 0.4 ms
Lead Channel Setting Sensing Sensitivity: 3 mV
Pulse Gen Serial Number: 955727

## 2021-06-14 NOTE — Progress Notes (Signed)
Remote pacemaker transmission.   

## 2021-06-18 ENCOUNTER — Encounter (HOSPITAL_COMMUNITY): Payer: Self-pay | Admitting: Cardiovascular Disease

## 2021-06-18 ENCOUNTER — Other Ambulatory Visit: Payer: Self-pay

## 2021-06-18 ENCOUNTER — Ambulatory Visit (INDEPENDENT_AMBULATORY_CARE_PROVIDER_SITE_OTHER): Payer: BC Managed Care – PPO | Admitting: Cardiovascular Disease

## 2021-06-18 ENCOUNTER — Encounter: Payer: Self-pay | Admitting: Cardiovascular Disease

## 2021-06-18 VITALS — BP 110/58 | Ht 71.0 in | Wt 238.0 lb

## 2021-06-18 DIAGNOSIS — I495 Sick sinus syndrome: Secondary | ICD-10-CM | POA: Diagnosis not present

## 2021-06-18 DIAGNOSIS — Z01818 Encounter for other preprocedural examination: Secondary | ICD-10-CM

## 2021-06-18 DIAGNOSIS — I484 Atypical atrial flutter: Secondary | ICD-10-CM | POA: Diagnosis not present

## 2021-06-18 DIAGNOSIS — G4733 Obstructive sleep apnea (adult) (pediatric): Secondary | ICD-10-CM

## 2021-06-18 DIAGNOSIS — I4819 Other persistent atrial fibrillation: Secondary | ICD-10-CM | POA: Diagnosis not present

## 2021-06-18 DIAGNOSIS — I10 Essential (primary) hypertension: Secondary | ICD-10-CM

## 2021-06-18 DIAGNOSIS — Z01812 Encounter for preprocedural laboratory examination: Secondary | ICD-10-CM

## 2021-06-18 DIAGNOSIS — E668 Other obesity: Secondary | ICD-10-CM

## 2021-06-18 DIAGNOSIS — I5032 Chronic diastolic (congestive) heart failure: Secondary | ICD-10-CM

## 2021-06-18 DIAGNOSIS — Z950 Presence of cardiac pacemaker: Secondary | ICD-10-CM

## 2021-06-18 LAB — BASIC METABOLIC PANEL
BUN/Creatinine Ratio: 14 (ref 10–24)
BUN: 13 mg/dL (ref 8–27)
CO2: 28 mmol/L (ref 20–29)
Calcium: 9.4 mg/dL (ref 8.6–10.2)
Chloride: 97 mmol/L (ref 96–106)
Creatinine, Ser: 0.95 mg/dL (ref 0.76–1.27)
Glucose: 74 mg/dL (ref 70–99)
Potassium: 4.8 mmol/L (ref 3.5–5.2)
Sodium: 137 mmol/L (ref 134–144)
eGFR: 83 mL/min/{1.73_m2} (ref >59)

## 2021-06-18 LAB — CBC
Hematocrit: 42 % (ref 37.5–51.0)
Hemoglobin: 15.1 g/dL (ref 13.0–17.7)
MCH: 33.8 pg — ABNORMAL HIGH (ref 26.6–33.0)
MCHC: 36 g/dL — ABNORMAL HIGH (ref 31.5–35.7)
MCV: 94 fL (ref 79–97)
Platelets: 234 10*3/uL (ref 150–450)
RBC: 4.47 x10E6/uL (ref 4.14–5.80)
RDW: 12.3 % (ref 11.6–15.4)
WBC: 7.9 10*3/uL (ref 3.4–10.8)

## 2021-06-18 MED ORDER — APIXABAN 5 MG PO TABS
5.0000 mg | ORAL_TABLET | Freq: Two times a day (BID) | ORAL | 3 refills | Status: DC
Start: 2021-06-18 — End: 2022-06-23

## 2021-06-18 MED ORDER — AMIODARONE HCL 200 MG PO TABS: 200.0000 mg | ORAL_TABLET | Freq: Two times a day (BID) | ORAL | 3 refills | Status: DC

## 2021-06-18 NOTE — Patient Instructions (Addendum)
Medication Instructions:  Continue with same medications. *If you need a refill on your cardiac medications before your next appointment, please call your pharmacy*   Follow-Up: At [facility], you and your health needs are our priority.  As part of our continuing mission to provide you with exceptional heart care, we have created designated Provider Care Teams.  These Care Teams include your primary Cardiologist (physician) and Advanced Practice Providers (APPs -  Physician Assistants and Nurse Practitioners) who all work together to provide you with the care you need, when you need it.  We recommend signing up for the patient portal called "MyChart".  Sign up information is provided on this After Visit Summary.  MyChart is used to connect with patients for Virtual Visits (Telemedicine).  Patients are able to view lab/test results, encounter notes, upcoming appointments, etc.  Non-urgent messages can be sent to your provider as well.   To learn more about what you can do with MyChart, go to [email or web address].    Your next appointment:   Follow up in 2-4 weeks with Dr. Sallyanne Kuster or afib clinic   Other Instructions You are scheduled for a Cardioversion on 06/26/2021 with Dr. Sallyanne Kuster. Please arrive at the [facility] (Main Entrance A) at [facility]: [address] [place], [place] [address] at 1:30 pm. (1 hour prior to procedure unless lab work is needed; if lab work is needed arrive 1.5 hours ahead)  DIET: Nothing to eat or drink after midnight except a sip of water with medications (see medication instructions below)   Medication Instructions: Hold furosemide  Continue your anticoagulant: eliquis You will need to continue your anticoagulant after your procedure until you  are told by your  Provider that it is safe to stop   Labs: today  You must have a responsible person to drive you home and stay in the waiting area during your procedure. Failure to do so could  result in cancellation.  Bring your insurance cards.  *Special Note: Every effort is made to have your procedure done on time. Occasionally there are emergencies that occur at the hospital that may cause delays. Please be patient if a delay does occur.

## 2021-06-18 NOTE — Progress Notes (Signed)
Patient ID: Shane [name], male   DOB: [birth year], 75 y.o.   MRN: [id number]     Cardiology Office Note    Date:  06/18/2021   [name], Shane [name] 07-25-1946, MRN [id number]  PCP:  Albina Billet, MD  Cardiologist:   Sanda Klein, MD   Chief Complaint  Patient presents with   Follow-up    3 months.   Headache   Shortness of Breath    History of Present Illness:  Shane [name] is a 75 y.o. male who presents for recurrent persistent atypical atrial flutter.    He has a history of tachycardia-bradycardia syndrome, chronic diastolic heart failure (last echo April 2021 shows mildly depressed LVEF 45-50%), morbid obesity, obstructive sleep apnea on CPAP.  He underwent radiofrequency ablation for atrial fibrillation in August 2018 with a recurrent atrial fibrillation requiring cardioversion in February 2021 and June 2021, then started on amiodarone and underwent successful cardioversion with lasting benefit on April 11, 2020.  He underwent pacemaker generator change out last December ([place], August 06, 2020).  He had recurrent atrial fibrillation in March 2022, symptomatic with difficult rate control.  Amiodarone was initiated.  He had repeat ablation performed in July 2022.  He again returned with atrial flutter, this time well rate controlled.  The amiodarone dose was increased to 200 mg twice daily in mid August.  He was reluctant to have repeat cardioversion until his follow-up appoint with me today.  He presented today with atypical atrial flutter with controlled ventricular rates.  This has been present without interruption for the last couple of months.  The flutter cycle length is around 400-410 ms.  We again tried to overdrive pace the atrial flutter.  This has not been successful in the past and was not successful today.  He ended up deteriorating to atrial fibrillation, with controlled ventricular rate.  He has 79% ventricular pacing.  Battery is at  beginning of service.  Lead parameters are normal.  He denies chest pain, dizziness, palpitations (never been aware of the arrhythmia itself), syncope, lower extremity edema.  He has NYHA functional class II exertional dyspnea.  He has been compliant with anticoagulation and has not had serious bleeding problems, although he has very easy bruising of his forearms.  He has not had any neurological events.  He reports compliance with CPAP and denies daytime hypersomnolence.  The atrial arrhythmia seems to be organized with a cycle length that today was around 320 ms.  When he first presented a month ago with these complaints, we attempted overdrive pacing unsuccessfully.  We tried it again today using decreasing cycle lengths from 290 ms, all the way down to 190 ms.  Just like a month ago, atrial pacing did engage the arrhythmia cycle, but this would stop for a beat or 2 before restarting, at higher cycle length deteriorating to atrial fibrillation, but then reorganizing to about 300 ms after a few minutes.  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   guidant  [facility] [employer] [phone])   Sinus node dysfunction (HCC)    Sleep apnea    CPAP   Wears hearing aid in both ears     Past Surgical History:  Procedure Laterality Date   ATRIAL FIBRILLATION ABLATION N/A 04/03/2017   Procedure: Atrial Fibrillation Ablation;  Surgeon: Constance Haw, MD;  Location: [place] [facility];  Service: Cardiovascular;  Laterality: N/A;   ATRIAL FIBRILLATION ABLATION N/A 03/01/2021   Procedure: ATRIAL FIBRILLATION ABLATION;  Surgeon: Constance Haw, MD;  Location: [place] CV LAB;  Service: Cardiovascular;  Laterality: N/A;   CARDIAC CATHETERIZATION  10/13/1994   No CAD   CARDIAC CATHETERIZATION  05/10/2003   No CAD   CARDIOVERSION N/A 10/21/2016    Procedure: CARDIOVERSION;  Surgeon: Sanda Klein, MD;  Location: [place];  Service: Cardiovascular;  Laterality: N/A;   CARDIOVERSION N/A 05/15/2017   Procedure: CARDIOVERSION;  Surgeon: Fay Records, MD;  Location: [place];  Service: Cardiovascular;  Laterality: N/A;   CARDIOVERSION N/A 10/21/2019   Procedure: CARDIOVERSION;  Surgeon: Sanda Klein, MD;  Location: [place];  Service: Cardiovascular;  Laterality: N/A;   CARDIOVERSION N/A 02/24/2020   Procedure: CARDIOVERSION;  Surgeon: Lelon Perla, MD;  Location: [facility];  Service: Cardiovascular;  Laterality: N/A;   CARDIOVERSION N/A 04/11/2020   Procedure: CARDIOVERSION;  Surgeon: Skeet Latch, MD;  Location: [place];  Service: Cardiovascular;  Laterality: N/A;   CARDIOVERSION N/A 12/26/2020   Procedure: CARDIOVERSION;  Surgeon: Sanda Klein, MD;  Location: [place];  Service: Cardiovascular;  Laterality: N/A;   CATARACT EXTRACTION W/PHACO Left 05/18/2019   Procedure: CATARACT EXTRACTION PHACO AND INTRAOCULAR LENS PLACEMENT ([place]) LEFT  1:21 11.0% 9.01;  Surgeon: Leandrew Koyanagi, MD;  Location: [place];  Service: Ophthalmology;  Laterality: Left;  sleep apnea   CATARACT EXTRACTION W/PHACO Right 06/29/2019   Procedure: CATARACT EXTRACTION PHACO AND INTRAOCULAR LENS PLACEMENT ([facility]) RIGHT  01:14.2  14.5%  10.92;  Surgeon: Leandrew Koyanagi, MD;  Location: [place];  Service: Ophthalmology;  Laterality: Right;  sleep apnea   CYST EXCISION  03/2019   on back    NM MYOCAR PERF WALL MOTION  05/07/2011   Lexiscan: No ishcemia   PERMANENT PACEMAKER INSERTION  03/12/2010   guidant   PPM GENERATOR CHANGEOUT N/A 08/06/2020   Procedure: PPM GENERATOR CHANGEOUT;  Surgeon: Sanda Klein, MD;  Location: [place] CV LAB;  Service: Cardiovascular;  Laterality: N/A;   ROTATOR CUFF REPAIR  1996   SHOULDER ARTHROSCOPY     TEE WITHOUT CARDIOVERSION N/A 03/01/2021   Procedure: TRANSESOPHAGEAL  ECHOCARDIOGRAM (TEE);  Surgeon: Constance Haw, MD;  Location: [place] CV LAB;  Service: Cardiovascular;  Laterality: N/A;   US ECHOCARDIOGRAPHY  05/07/2011   mod. LVH,LA mod. dilated,borderline aortic root dilatation    Outpatient Medications Prior to Visit  Medication Sig Dispense Refill   acetaminophen (TYLENOL) 650 MG CR tablet Take 1,300 mg by mouth every 8 (eight) hours as needed for pain.     allopurinol (ZYLOPRIM) 300 MG tablet Take 1 tablet (300 mg total) by mouth daily. 30 tablet 2   Apoaequorin (PREVAGEN PO) Take 1 tablet by mouth daily.     ARTIFICIAL TEAR SOLUTION OP Place 1 drop into both eyes daily as needed (dry eyes).     Ascorbic Acid (VITAMIN C) 1000 MG tablet Take 1,000 mg by mouth daily.     Cholecalciferol (VITAMIN D-3 PO) Take 2,500 Units by mouth at bedtime.     citalopram (CELEXA) 40 MG tablet Take 1 tablet (40 mg total) by mouth daily. MUST RECEIVE FUTURE REFILLS FROM PCP. 30 tablet 0   colchicine 0.6 MG tablet Take 0.5 tablets (0.3 mg total) by mouth daily as needed. 30 tablet 2   diltiazem (CARDIZEM CD) 120 MG 24 hr capsule  Take 1 capsule (120 mg total) by mouth daily. 90 capsule 1   fluticasone (FLONASE) 50 MCG/ACT nasal spray Place 2 sprays into both nostrils daily as needed for rhinitis.     furosemide (LASIX) 40 MG tablet Take 1.5 tablets (60 mg total) by mouth daily. 45 tablet 11   methylcellulose oral powder Take 15 mLs by mouth daily with supper.     montelukast (SINGULAIR) 10 MG tablet Take 10 mg by mouth at bedtime.     Multiple Vitamin (MULTIVITAMIN WITH MINERALS) TABS tablet Take 1 tablet by mouth daily. Senior     Omega-3 Fatty Acids (FISH OIL) 1200 MG CAPS Take 1,200 mg by mouth 2 (two) times daily.      Phenylephrine-APAP-guaiFENesin (MUCINEX SINUS-MAX CONG & PAIN PO) Take 2 tablets by mouth daily as needed (sinus headaches).      potassium chloride SA (KLOR-CON) 20 MEQ tablet Take 1 tablet (20 mEq total) by mouth daily. 90 tablet 3    pyridOXINE (VITAMIN B-6) 100 MG tablet Take 100 mg by mouth daily.     sodium chloride (OCEAN) 0.65 % SOLN nasal spray Place 1 spray into both nostrils daily as needed for congestion.     tamsulosin (FLOMAX) 0.4 MG CAPS capsule Take 1 capsule (0.4 mg total) by mouth daily. 30 capsule 11   Testosterone 1.62 % GEL Apply 3 Pump topically daily. Shoulders  1   vitamin B-12 (CYANOCOBALAMIN) 1000 MCG tablet Take 1,000 mcg by mouth daily.     zinc gluconate 50 MG tablet Take 50 mg by mouth daily.     amiodarone (PACERONE) 200 MG tablet Take 1 tablet (200 mg total) by mouth in the morning and at bedtime. 60 tablet 1   ELIQUIS 5 MG TABS tablet Take 1 tablet (5 mg total) by mouth 2 (two) times daily. 180 tablet 1   metoprolol tartrate (LOPRESSOR) 100 MG tablet Take 1 tablet (100 mg total) by mouth once for 1 dose. Please take one time dose 100mg  metoprolol tartrate 2 hr prior to cardiac CT for HR control IF HR >55bpm. 1 tablet 0   No facility-administered medications prior to visit.     Allergies:   Patient has no known allergies.   Social History   Socioeconomic History   Marital status: Married    Spouse name: Not on file   Number of children: Not on file   Years of education: Not on file   Highest education level: Not on file  Occupational History   Not on file  Tobacco Use   Smoking status: Former    Types: Cigars    Quit date: 05/11/1993    Years since quitting: 28.1   Smokeless tobacco: Former    Types: Chew    Quit date: 05/11/1993   Tobacco comments:    Former smoker 04/29/21  Vaping Use   Vaping Use: Never used  Substance and Sexual Activity   Alcohol use: Not Currently    Comment: social   Drug use: No   Sexual activity: Not on file  Other Topics Concern   Not on file  Social History Narrative   Not on file   Social Determinants of Health   Financial Resource Strain: Not on file  Food Insecurity: Not on file  Transportation Needs: Not on file  Physical Activity: Not  on file  Stress: Not on file  Social Connections: Not on file     Family History:  The patient's family history includes Alzheimer's disease in his  father; Dementia in his sister; Heart disease in his sister; Heart failure in his mother; Stroke in his mother.   ROS:   Please see the history of present illness.   All other systems are reviewed and are negative.   PHYSICAL EXAM:   VS:  BP (!) 110/58 (BP Location: Left Arm, Patient Position: Sitting, Cuff Size: Large)   Ht 5\' 11"  (1.803 m)   Wt 238 lb (108 kg)   BMI 33.19 kg/m       General: Alert, oriented x3, no distress, moderately obese.  Healthy the subclavian pacemaker site. Head: no evidence of trauma, PERRL, EOMI, no exophtalmos or lid lag, no myxedema, no xanthelasma; normal ears, nose and oropharynx Neck: normal jugular venous pulsations and no hepatojugular reflux; brisk carotid pulses without delay and no carotid bruits Chest: clear to auscultation, no signs of consolidation by percussion or palpation, normal fremitus, symmetrical and full respiratory excursions Cardiovascular: normal position and quality of the apical impulse, regular rhythm (ventricular paced), normal first and paradoxically split second heart sounds, no murmurs, rubs or gallops Abdomen: no tenderness or distention, no masses by palpation, no abnormal pulsatility or arterial bruits, normal bowel sounds, no hepatosplenomegaly Extremities: no clubbing, cyanosis or edema; 2+ radial, ulnar and brachial pulses bilaterally; 2+ right femoral, posterior tibial and dorsalis pedis pulses; 2+ left femoral, posterior tibial and dorsalis pedis pulses; no subclavian or femoral bruits Neurological: grossly nonfocal Psych: Normal mood and affect   Wt Readings from Last 3 Encounters:  06/18/21 238 lb (108 kg)  04/29/21 241 lb 9.6 oz (109.6 kg)  04/18/21 243 lb 9.6 oz (110.5 kg)      Studies/Labs Reviewed:   EKG:  EKG is performed 04/29/2021 shows atrial sensed,  ventricular paced rhythm.  A little hard to see, but the atrial mechanism appears to be atrial flutter with 3: 1 AV block.  Comprehensive interrogation of his pacemaker today shows normal device function.  The presenting rhythm is atrial flutter with occasional conducted beats, but mostly ventricular paced rhythm.  The flutter cycle length is 400-410 ms.  Otherwise pacemaker function is normal.  Attempted overdrive pacing of the atrial arrhythmia with cycle lengths starting at 350 ms, promptly accelerating the arrhythmia to a regular flutter with a cycle length of around 230 ms.  Further attempts at overdrive burst atrial pacing with diminishing cycle lengths eventually led to deterioration to atrial fibrillation.  Ventricular rate remained controlled throughout. ASSESSMENT:    1. Persistent atrial fibrillation ([place])   2. Atypical atrial flutter (HCC)   3. Pre-procedure lab exam   4. SSS (sick sinus syndrome) ([place])   5. Essential hypertension   6. Moderate obesity   7. Presence of cardiac pacemaker   8. Chronic diastolic CHF (congestive heart failure) ([place])   9. OSA (obstructive sleep apnea)       PLAN:  In order of problems listed above:  AFib s/p RFA 2018/redo RFA August 2022/atypical atrial flutter: Atypical atrial flutter, likely with a left atrial circuit, multiple unsuccessful attempts at overdrive pacing with deterioration to atrial fibrillation, including today.  He is still in the first 3 months following his repeat ablation and we might still have a chance to get him back to normal rhythm.  Compliant with anticoagulation with Eliquis.  CHA2DS2-VASc 4 (age 25, CHF, hypertension).   No history of embolic events.  Schedule him for another attempt at cardioversion next week.  This procedure has been fully reviewed with the patient and written informed consent has  been obtained. SSS: Heart rate histogram distribution appears to be better after the latest adjustments to his sensor  settings. HTN: Excellent control.  Watch out for symptoms of orthostatic hypotension since he is taking diuretics, diltiazem as well as alpha blockers Obesity: We discussed the relationship between being obese and the prevalence of atrial fibrillation many times. PM: Normal device function.  Doing remote downloads.  Ventricular pacing burden is up to 79% after increasing the amiodarone, thankfully without evidence of worsening heart failure. CHF: He does not have overt evidence of hypervolemia by physical exam today.  His most recent echo from April 2021 showed a slight reduction in LVEF to 45-50%, hopefully this will improve with better rate/rhythm control.  Recheck several weeks after his cardioversion. OSA: He reports 100% compliance with CPAP and he denies daytime hypersomnolence.  We were unable to estimate PA pressure on his last echo.  I reinforced the need for compliance with CPAP.     Medication Adjustments/Labs and Tests Ordered: Current medicines are reviewed at length with the patient today.  Concerns regarding medicines are outlined above.  Medication changes, Labs and Tests ordered today are listed in the Patient Instructions below. Patient Instructions  Medication Instructions:  Continue with same medications. *If you need a refill on your cardiac medications before your next appointment, please call your pharmacy*   Follow-Up: At [facility] you and your health needs are our priority.  As part of our continuing mission to provide you with exceptional heart care, we have created designated Provider Care Teams.  These Care Teams include your primary Cardiologist (physician) and Advanced Practice Providers (APPs -  Physician Assistants and Nurse Practitioners) who all work together to provide you with the care you need, when you need it.  We recommend signing up for the patient portal called "MyChart".  Sign up information is provided on this After Visit Summary.  MyChart is  used to connect with patients for Virtual Visits (Telemedicine).  Patients are able to view lab/test results, encounter notes, upcoming appointments, etc.  Non-urgent messages can be sent to your provider as well.   To learn more about what you can do with MyChart, go to [email or web address].    Your next appointment:   Follow up in 2-4 weeks with Dr. Sallyanne Kuster or afib clinic   Other Instructions You are scheduled for a Cardioversion on 06/26/2021 with Dr. Sallyanne Kuster. Please arrive at the [facility] (Main Entrance A) at [facility]: [address] [place],  [address] at 1:30 pm. (1 hour prior to procedure unless lab work is needed; if lab work is needed arrive 1.5 hours ahead)  DIET: Nothing to eat or drink after midnight except a sip of water with medications (see medication instructions below)   Medication Instructions: Hold furosemide  Continue your anticoagulant: eliquis You will need to continue your anticoagulant after your procedure until you  are told by your  Provider that it is safe to stop   Labs: today  You must have a responsible person to drive you home and stay in the waiting area during your procedure. Failure to do so could result in cancellation.  Bring your insurance cards.  *Special Note: Every effort is made to have your procedure done on time. Occasionally there are emergencies that occur at the hospital that may cause delays. Please be patient if a delay does occur.       Signed, Sanda Klein, MD  06/18/2021 12:12 PM    [facility] [place], [place], [place]  [address] Phone: [phone]; Fax: [phone]

## 2021-06-18 NOTE — H&P (View-Only) (Signed)
Patient ID: Shane [name], male   DOB: Jan 10, 1946, 75 y.o.   MRN: [id number]     Cardiology Office Note    Date:  06/18/2021   ID:  Lewis, Keats [birth year], MRN [id number]  PCP:  Albina Billet, MD  Cardiologist:   Sanda Klein, MD   Chief Complaint  Patient presents with   Follow-up    3 months.   Headache   Shortness of Breath    History of Present Illness:  Shane [name] is a 75 y.o. male who presents for recurrent persistent atypical atrial flutter.    He has a history of tachycardia-bradycardia syndrome, chronic diastolic heart failure (last echo April 2021 shows mildly depressed LVEF 45-50%), morbid obesity, obstructive sleep apnea on CPAP.  He underwent radiofrequency ablation for atrial fibrillation in August 2018 with a recurrent atrial fibrillation requiring cardioversion in February 2021 and June 2021, then started on amiodarone and underwent successful cardioversion with lasting benefit on April 11, 2020.  He underwent pacemaker generator change out last December ([place], August 06, 2020).  He had recurrent atrial fibrillation in March 2022, symptomatic with difficult rate control.  Amiodarone was initiated.  He had repeat ablation performed in July 2022.  He again returned with atrial flutter, this time well rate controlled.  The amiodarone dose was increased to 200 mg twice daily in mid August.  He was reluctant to have repeat cardioversion until his follow-up appoint with me today.  He presented today with atypical atrial flutter with controlled ventricular rates.  This has been present without interruption for the last couple of months.  The flutter cycle length is around 400-410 ms.  We again tried to overdrive pace the atrial flutter.  This has not been successful in the past and was not successful today.  He ended up deteriorating to atrial fibrillation, with controlled ventricular rate.  He has 79% ventricular pacing.  Battery is at  beginning of service.  Lead parameters are normal.  He denies chest pain, dizziness, palpitations (never been aware of the arrhythmia itself), syncope, lower extremity edema.  He has NYHA functional class II exertional dyspnea.  He has been compliant with anticoagulation and has not had serious bleeding problems, although he has very easy bruising of his forearms.  He has not had any neurological events.  He reports compliance with CPAP and denies daytime hypersomnolence.  The atrial arrhythmia seems to be organized with a cycle length that today was around 320 ms.  When he first presented a month ago with these complaints, we attempted overdrive pacing unsuccessfully.  We tried it again today using decreasing cycle lengths from 290 ms, all the way down to 190 ms.  Just like a month ago, atrial pacing did engage the arrhythmia cycle, but this would stop for a beat or 2 before restarting, at higher cycle length deteriorating to atrial fibrillation, but then reorganizing to about 300 ms after a few minutes.  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   guidant  [facility] [phone])   Sinus node dysfunction (HCC)    Sleep apnea    CPAP   Wears hearing aid in both ears     Past Surgical History:  Procedure Laterality Date   ATRIAL FIBRILLATION ABLATION N/A 04/03/2017   Procedure: Atrial Fibrillation Ablation;  Surgeon: Constance Haw, MD;  Location: [place] [facility];  Service: Cardiovascular;  Laterality: N/A;   ATRIAL FIBRILLATION ABLATION N/A 03/01/2021   Procedure: ATRIAL FIBRILLATION ABLATION;  Surgeon: Constance Haw, MD;  Location: [place] [facility];  Service: Cardiovascular;  Laterality: N/A;   CARDIAC CATHETERIZATION  10/13/1994   No CAD   CARDIAC CATHETERIZATION  05/10/2003   No CAD   CARDIOVERSION N/A 10/21/2016    Procedure: CARDIOVERSION;  Surgeon: Sanda Klein, MD;  Location: [place];  Service: Cardiovascular;  Laterality: N/A;   CARDIOVERSION N/A 05/15/2017   Procedure: CARDIOVERSION;  Surgeon: Fay Records, MD;  Location: [place];  Service: Cardiovascular;  Laterality: N/A;   CARDIOVERSION N/A 10/21/2019   Procedure: CARDIOVERSION;  Surgeon: Sanda Klein, MD;  Location: [place];  Service: Cardiovascular;  Laterality: N/A;   CARDIOVERSION N/A 02/24/2020   Procedure: CARDIOVERSION;  Surgeon: Lelon Perla, MD;  Location: [facility];  Service: Cardiovascular;  Laterality: N/A;   CARDIOVERSION N/A 04/11/2020   Procedure: CARDIOVERSION;  Surgeon: Skeet Latch, MD;  Location: [place];  Service: Cardiovascular;  Laterality: N/A;   CARDIOVERSION N/A 12/26/2020   Procedure: CARDIOVERSION;  Surgeon: Sanda Klein, MD;  Location: [place];  Service: Cardiovascular;  Laterality: N/A;   CATARACT EXTRACTION W/PHACO Left 05/18/2019   Procedure: CATARACT EXTRACTION PHACO AND INTRAOCULAR LENS PLACEMENT ([place]) LEFT  1:21 11.0% 9.01;  Surgeon: Leandrew Koyanagi, MD;  Location: [place];  Service: Ophthalmology;  Laterality: Left;  sleep apnea   CATARACT EXTRACTION W/PHACO Right 06/29/2019   Procedure: CATARACT EXTRACTION PHACO AND INTRAOCULAR LENS PLACEMENT ([facility]) RIGHT  01:14.2  14.5%  10.92;  Surgeon: Leandrew Koyanagi, MD;  [place]  Service: Ophthalmology;  Laterality: Right;  sleep apnea   CYST EXCISION  03/2019   on back    NM MYOCAR PERF WALL MOTION  05/07/2011   Lexiscan: No ishcemia   PERMANENT PACEMAKER INSERTION  03/12/2010   guidant   PPM GENERATOR CHANGEOUT N/A 08/06/2020   Procedure: PPM GENERATOR CHANGEOUT;  Surgeon: Sanda Klein, MD;  Location: [place] [facility];  Service: Cardiovascular;  Laterality: N/A;   ROTATOR CUFF REPAIR  1996   SHOULDER ARTHROSCOPY     TEE WITHOUT CARDIOVERSION N/A 03/01/2021   Procedure: TRANSESOPHAGEAL  ECHOCARDIOGRAM (TEE);  Surgeon: Constance Haw, MD;  Location: [place] [facility];  Service: Cardiovascular;  Laterality: N/A;   US ECHOCARDIOGRAPHY  05/07/2011   mod. LVH,LA mod. dilated,borderline aortic root dilatation    Outpatient Medications Prior to Visit  Medication Sig Dispense Refill   acetaminophen (TYLENOL) 650 MG CR tablet Take 1,300 mg by mouth every 8 (eight) hours as needed for pain.     allopurinol (ZYLOPRIM) 300 MG tablet Take 1 tablet (300 mg total) by mouth daily. 30 tablet 2   Apoaequorin (PREVAGEN PO) Take 1 tablet by mouth daily.     ARTIFICIAL TEAR SOLUTION OP Place 1 drop into both eyes daily as needed (dry eyes).     Ascorbic Acid (VITAMIN C) 1000 MG tablet Take 1,000 mg by mouth daily.     Cholecalciferol (VITAMIN D-3 PO) Take 2,500 Units by mouth at bedtime.     citalopram (CELEXA) 40 MG tablet Take 1 tablet (40 mg total) by mouth daily. MUST RECEIVE FUTURE REFILLS FROM PCP. 30 tablet 0   colchicine 0.6 MG tablet Take 0.5 tablets (0.3 mg total) by mouth daily as needed. 30 tablet 2   diltiazem (CARDIZEM CD) 120 MG 24 hr capsule  Take 1 capsule (120 mg total) by mouth daily. 90 capsule 1   fluticasone (FLONASE) 50 MCG/ACT nasal spray Place 2 sprays into both nostrils daily as needed for rhinitis.     furosemide (LASIX) 40 MG tablet Take 1.5 tablets (60 mg total) by mouth daily. 45 tablet 11   methylcellulose oral powder Take 15 mLs by mouth daily with supper.     montelukast (SINGULAIR) 10 MG tablet Take 10 mg by mouth at bedtime.     Multiple Vitamin (MULTIVITAMIN WITH MINERALS) TABS tablet Take 1 tablet by mouth daily. Senior     Omega-3 Fatty Acids (FISH OIL) 1200 MG CAPS Take 1,200 mg by mouth 2 (two) times daily.      Phenylephrine-APAP-guaiFENesin (MUCINEX SINUS-MAX CONG & PAIN PO) Take 2 tablets by mouth daily as needed (sinus headaches).      potassium chloride SA (KLOR-CON) 20 MEQ tablet Take 1 tablet (20 mEq total) by mouth daily. 90 tablet 3    pyridOXINE (VITAMIN B-6) 100 MG tablet Take 100 mg by mouth daily.     sodium chloride (OCEAN) 0.65 % SOLN nasal spray Place 1 spray into both nostrils daily as needed for congestion.     tamsulosin (FLOMAX) 0.4 MG CAPS capsule Take 1 capsule (0.4 mg total) by mouth daily. 30 capsule 11   Testosterone 1.62 % GEL Apply 3 Pump topically daily. Shoulders  1   vitamin B-12 (CYANOCOBALAMIN) 1000 MCG tablet Take 1,000 mcg by mouth daily.     zinc gluconate 50 MG tablet Take 50 mg by mouth daily.     amiodarone (PACERONE) 200 MG tablet Take 1 tablet (200 mg total) by mouth in the morning and at bedtime. 60 tablet 1   ELIQUIS 5 MG TABS tablet Take 1 tablet (5 mg total) by mouth 2 (two) times daily. 180 tablet 1   metoprolol tartrate (LOPRESSOR) 100 MG tablet Take 1 tablet (100 mg total) by mouth once for 1 dose. Please take one time dose 100mg  metoprolol tartrate 2 hr prior to cardiac CT for HR control IF HR >55bpm. 1 tablet 0   No facility-administered medications prior to visit.     Allergies:   Patient has no known allergies.   Social History   Socioeconomic History   Marital status: Married    Spouse name: Not on file   Number of children: Not on file   Years of education: Not on file   Highest education level: Not on file  Occupational History   Not on file  Tobacco Use   Smoking status: Former    Types: Cigars    Quit date: 05/11/1993    Years since quitting: 28.1   Smokeless tobacco: Former    Types: Chew    Quit date: 05/11/1993   Tobacco comments:    Former smoker 04/29/21  Vaping Use   Vaping Use: Never used  Substance and Sexual Activity   Alcohol use: Not Currently    Comment: social   Drug use: No   Sexual activity: Not on file  Other Topics Concern   Not on file  Social History Narrative   Not on file   Social Determinants of Health   Financial Resource Strain: Not on file  Food Insecurity: Not on file  Transportation Needs: Not on file  Physical Activity: Not  on file  Stress: Not on file  Social Connections: Not on file     Family History:  The patient's family history includes Alzheimer's disease in his  father; Dementia in his sister; Heart disease in his sister; Heart failure in his mother; Stroke in his mother.   ROS:   Please see the history of present illness.   All other systems are reviewed and are negative.   PHYSICAL EXAM:   VS:  BP (!) 110/58 (BP Location: Left Arm, Patient Position: Sitting, Cuff Size: Large)   Ht 5\' 11"  (1.803 m)   Wt 238 lb (108 kg)   BMI 33.19 kg/m       General: Alert, oriented x3, no distress, moderately obese.  Healthy the subclavian pacemaker site. Head: no evidence of trauma, PERRL, EOMI, no exophtalmos or lid lag, no myxedema, no xanthelasma; normal ears, nose and oropharynx Neck: normal jugular venous pulsations and no hepatojugular reflux; brisk carotid pulses without delay and no carotid bruits Chest: clear to auscultation, no signs of consolidation by percussion or palpation, normal fremitus, symmetrical and full respiratory excursions Cardiovascular: normal position and quality of the apical impulse, regular rhythm (ventricular paced), normal first and paradoxically split second heart sounds, no murmurs, rubs or gallops Abdomen: no tenderness or distention, no masses by palpation, no abnormal pulsatility or arterial bruits, normal bowel sounds, no hepatosplenomegaly Extremities: no clubbing, cyanosis or edema; 2+ radial, ulnar and brachial pulses bilaterally; 2+ right femoral, posterior tibial and dorsalis pedis pulses; 2+ left femoral, posterior tibial and dorsalis pedis pulses; no subclavian or femoral bruits Neurological: grossly nonfocal Psych: Normal mood and affect   Wt Readings from Last 3 Encounters:  06/18/21 238 lb (108 kg)  04/29/21 241 lb 9.6 oz (109.6 kg)  04/18/21 243 lb 9.6 oz (110.5 kg)      Studies/Labs Reviewed:   EKG:  EKG is performed 04/29/2021 shows atrial sensed,  ventricular paced rhythm.  A little hard to see, but the atrial mechanism appears to be atrial flutter with 3: 1 AV block.  Comprehensive interrogation of his pacemaker today shows normal device function.  The presenting rhythm is atrial flutter with occasional conducted beats, but mostly ventricular paced rhythm.  The flutter cycle length is 400-410 ms.  Otherwise pacemaker function is normal.  Attempted overdrive pacing of the atrial arrhythmia with cycle lengths starting at 350 ms, promptly accelerating the arrhythmia to a regular flutter with a cycle length of around 230 ms.  Further attempts at overdrive burst atrial pacing with diminishing cycle lengths eventually led to deterioration to atrial fibrillation.  Ventricular rate remained controlled throughout. ASSESSMENT:    1. Persistent atrial fibrillation ([place])   2. Atypical atrial flutter (HCC)   3. Pre-procedure lab exam   4. SSS (sick sinus syndrome) ([place])   5. Essential hypertension   6. Moderate obesity   7. Presence of cardiac pacemaker   8. Chronic diastolic CHF (congestive heart failure) ([place])   9. OSA (obstructive sleep apnea)       PLAN:  In order of problems listed above:  AFib s/p RFA 2018/redo RFA August 2022/atypical atrial flutter: Atypical atrial flutter, likely with a left atrial circuit, multiple unsuccessful attempts at overdrive pacing with deterioration to atrial fibrillation, including today.  He is still in the first 3 months following his repeat ablation and we might still have a chance to get him back to normal rhythm.  Compliant with anticoagulation with Eliquis.  CHA2DS2-VASc 4 (age 50, CHF, hypertension).   No history of embolic events.  Schedule him for another attempt at cardioversion next week.  This procedure has been fully reviewed with the patient and written informed consent has  been obtained. SSS: Heart rate histogram distribution appears to be better after the latest adjustments to his sensor  settings. HTN: Excellent control.  Watch out for symptoms of orthostatic hypotension since he is taking diuretics, diltiazem as well as alpha blockers Obesity: We discussed the relationship between being obese and the prevalence of atrial fibrillation many times. PM: Normal device function.  Doing remote downloads.  Ventricular pacing burden is up to 79% after increasing the amiodarone, thankfully without evidence of worsening heart failure. CHF: He does not have overt evidence of hypervolemia by physical exam today.  His most recent echo from April 2021 showed a slight reduction in LVEF to 45-50%, hopefully this will improve with better rate/rhythm control.  Recheck several weeks after his cardioversion. OSA: He reports 100% compliance with CPAP and he denies daytime hypersomnolence.  We were unable to estimate PA pressure on his last echo.  I reinforced the need for compliance with CPAP.     Medication Adjustments/Labs and Tests Ordered: Current medicines are reviewed at length with the patient today.  Concerns regarding medicines are outlined above.  Medication changes, Labs and Tests ordered today are listed in the Patient Instructions below. Patient Instructions  Medication Instructions:  Continue with same medications. *If you need a refill on your cardiac medications before your next appointment, please call your pharmacy*   Follow-Up: At [facility], you and your health needs are our priority.  As part of our continuing mission to provide you with exceptional heart care, we have created designated Provider Care Teams.  These Care Teams include your primary Cardiologist (physician) and Advanced Practice Providers (APPs -  Physician Assistants and Nurse Practitioners) who all work together to provide you with the care you need, when you need it.  We recommend signing up for the patient portal called "MyChart".  Sign up information is provided on this After Visit Summary.  MyChart is  used to connect with patients for Virtual Visits (Telemedicine).  Patients are able to view lab/test results, encounter notes, upcoming appointments, etc.  Non-urgent messages can be sent to your provider as well.   To learn more about what you can do with MyChart, go to [email or web address].    Your next appointment:   Follow up in 2-4 weeks with Dr. Sallyanne Kuster or afib clinic   Other Instructions You are scheduled for a Cardioversion on 06/26/2021 with Dr. Sallyanne Kuster. Please arrive at the [facility] (Main Entrance A) at [facility]: [address] [place],  [address] at 1:30 pm. (1 hour prior to procedure unless lab work is needed; if lab work is needed arrive 1.5 hours ahead)  DIET: Nothing to eat or drink after midnight except a sip of water with medications (see medication instructions below)   Medication Instructions: Hold furosemide  Continue your anticoagulant: eliquis You will need to continue your anticoagulant after your procedure until you  are told by your  Provider that it is safe to stop   Labs: today  You must have a responsible person to drive you home and stay in the waiting area during your procedure. Failure to do so could result in cancellation.  Bring your insurance cards.  *Special Note: Every effort is made to have your procedure done on time. Occasionally there are emergencies that occur at the hospital that may cause delays. Please be patient if a delay does occur.       Signed, Sanda Klein, MD  06/18/2021 12:12 PM    [place]  [facility] [address], [place], [place]  [address] Phone: [phone]; Fax: [phone]

## 2021-06-19 ENCOUNTER — Telehealth: Payer: Self-pay | Admitting: Cardiovascular Disease

## 2021-06-19 NOTE — Telephone Encounter (Signed)
Needs to discuss with nurse something about he needs to due prior to procedure his is having done.

## 2021-06-19 NOTE — Telephone Encounter (Signed)
Spoke with the pt and he called to report that he is getting his COVID vaccine #4 2 days prior to his Cardioversion but in the past he has never had any side effects.

## 2021-06-24 ENCOUNTER — Encounter: Payer: BC Managed Care – PPO | Admitting: Cardiology

## 2021-06-25 ENCOUNTER — Other Ambulatory Visit: Payer: Self-pay | Admitting: *Deleted

## 2021-06-26 ENCOUNTER — Ambulatory Visit (HOSPITAL_COMMUNITY)
Admission: RE | Admit: 2021-06-26 | Discharge: 2021-06-26 | Disposition: A | Discharge status: Home / Self Care | Payer: BC Managed Care – PPO | Attending: Cardiovascular Disease | Admitting: Cardiovascular Disease

## 2021-06-26 ENCOUNTER — Encounter (HOSPITAL_COMMUNITY): Payer: Self-pay | Admitting: Anesthesiology

## 2021-06-26 ENCOUNTER — Encounter (HOSPITAL_COMMUNITY)
Admission: RE | Disposition: A | Discharge status: Home / Self Care | Payer: Self-pay | Source: Home / Self Care | Attending: Cardiovascular Disease

## 2021-06-26 ENCOUNTER — Encounter (HOSPITAL_COMMUNITY): Payer: Self-pay | Admitting: Cardiovascular Disease

## 2021-06-26 ENCOUNTER — Other Ambulatory Visit: Payer: Self-pay

## 2021-06-26 DIAGNOSIS — Z538 Procedure and treatment not carried out for other reasons: Secondary | ICD-10-CM | POA: Insufficient documentation

## 2021-06-26 DIAGNOSIS — I4819 Other persistent atrial fibrillation: Secondary | ICD-10-CM | POA: Diagnosis not present

## 2021-06-26 SURGERY — CANCELLED PROCEDURE

## 2021-06-26 NOTE — Progress Notes (Signed)
[employer] Rep, Joey, able to pace patient back into rhythm. Dr. Sallyanne Kuster aware, pt discharged home with family.    Larene Beach RN

## 2021-06-26 NOTE — Interval H&P Note (Signed)
History and Physical Interval Note:  06/26/2021 1:53 PM  Shane [name]  has presented today for surgery, with the diagnosis of AFIB.  The various methods of treatment have been discussed with the patient and family. After consideration of risks, benefits and other options for treatment, the patient has consented to  Procedure(s): CARDIOVERSION (N/A) as a surgical intervention.  The patient's history has been reviewed, patient examined, no change in status, stable for surgery.  I have reviewed the patient's chart and labs.  Questions were answered to the patient's satisfaction.     Keni Elison

## 2021-06-26 NOTE — Anesthesia Preprocedure Evaluation (Deleted)
Anesthesia Evaluation    Reviewed: Allergy & Precautions, Patient's Chart, lab work & pertinent test results  History of Anesthesia Complications Negative for: history of anesthetic complications  Airway        Dental   Pulmonary sleep apnea and Continuous Positive Airway Pressure Ventilation , former smoker,           Cardiovascular +CHF  + dysrhythmias (on Eliquis and amiodarone) Atrial Fibrillation + pacemaker   TTE 12/2019: septal and inferior wall hypokinesis, EF 45-50%, mild LVE, severe LVH, severe LAE, mild MR, mild dilatation of aortic root measuring 79mm    Neuro/Psych negative neurological ROS     GI/Hepatic negative GI ROS, Neg liver ROS,   Endo/Other  negative endocrine ROS  Renal/GU negative Renal ROS  negative genitourinary   Musculoskeletal negative musculoskeletal ROS (+)   Abdominal   Peds  Hematology negative hematology ROS (+)   Anesthesia Other Findings Day of surgery medications reviewed with patient.  Reproductive/Obstetrics negative OB ROS                             Anesthesia Physical Anesthesia Plan  ASA: 3  Anesthesia Plan: General   Post-op Pain Management:    Induction: Intravenous  PONV Risk Score and Plan: Treatment may vary due to age or medical condition and Propofol infusion  Airway Management Planned: Mask  Additional Equipment: None  Intra-op Plan:   Post-operative Plan:   Informed Consent:   Plan Discussed with:   Anesthesia Plan Comments:         Anesthesia Quick Evaluation

## 2021-07-09 ENCOUNTER — Other Ambulatory Visit: Payer: Self-pay

## 2021-07-30 ENCOUNTER — Other Ambulatory Visit: Payer: Self-pay

## 2021-07-30 ENCOUNTER — Encounter (HOSPITAL_COMMUNITY): Payer: Self-pay | Admitting: Nurse Practitioner

## 2021-07-30 ENCOUNTER — Ambulatory Visit (HOSPITAL_COMMUNITY)
Admission: RE | Admit: 2021-07-30 | Discharge: 2021-07-30 | Disposition: A | Discharge status: Home / Self Care | Payer: BC Managed Care – PPO | Source: Ambulatory Visit | Attending: Nurse Practitioner | Admitting: Nurse Practitioner

## 2021-07-30 VITALS — BP 124/64 | HR 60 | Ht 71.0 in | Wt 246.2 lb

## 2021-07-30 DIAGNOSIS — I495 Sick sinus syndrome: Secondary | ICD-10-CM | POA: Insufficient documentation

## 2021-07-30 DIAGNOSIS — E669 Obesity, unspecified: Secondary | ICD-10-CM | POA: Diagnosis not present

## 2021-07-30 DIAGNOSIS — D6869 Other thrombophilia: Secondary | ICD-10-CM

## 2021-07-30 DIAGNOSIS — R9431 Abnormal electrocardiogram [ECG] [EKG]: Secondary | ICD-10-CM | POA: Insufficient documentation

## 2021-07-30 DIAGNOSIS — I4819 Other persistent atrial fibrillation: Secondary | ICD-10-CM | POA: Diagnosis not present

## 2021-07-30 DIAGNOSIS — Z79899 Other long term (current) drug therapy: Secondary | ICD-10-CM | POA: Diagnosis not present

## 2021-07-30 DIAGNOSIS — Z950 Presence of cardiac pacemaker: Secondary | ICD-10-CM | POA: Diagnosis not present

## 2021-07-30 DIAGNOSIS — I484 Atypical atrial flutter: Secondary | ICD-10-CM | POA: Diagnosis not present

## 2021-07-30 DIAGNOSIS — Z7901 Long term (current) use of anticoagulants: Secondary | ICD-10-CM | POA: Diagnosis not present

## 2021-07-30 DIAGNOSIS — G4733 Obstructive sleep apnea (adult) (pediatric): Secondary | ICD-10-CM | POA: Diagnosis not present

## 2021-07-30 DIAGNOSIS — Z9989 Dependence on other enabling machines and devices: Secondary | ICD-10-CM | POA: Diagnosis not present

## 2021-07-30 DIAGNOSIS — I4892 Unspecified atrial flutter: Secondary | ICD-10-CM | POA: Diagnosis not present

## 2021-07-30 DIAGNOSIS — Z01812 Encounter for preprocedural laboratory examination: Secondary | ICD-10-CM

## 2021-07-30 MED ORDER — AMIODARONE HCL 200 MG PO TABS: 200.0000 mg | ORAL_TABLET | Freq: Every day | ORAL | 2 refills | Status: DC

## 2021-07-30 NOTE — Progress Notes (Signed)
Primary Care Physician: Albina Billet, MD Referring Physician: Dr. 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 He was seen in the office by Dr. Loletha Grayer and he attempted to pace him out which was unsuccessful. On f/u here if he was still in out of rhythm, cardioversion was to be scheduled. Ekg shows atrial flutter at 118 bpm. .  He continues on eliquis 5 mg bid for CHA2DS2VASc score of 4. He is mildly symptomatic.  F/u in afib clinic, 7/6, one week s/p successful  cardioversion.  Unfortunately, he is in atrial flutter at a rate around 150 bpm and mildly  hypotensive. Pt feels slightly lightheaded and mild fatgiue. He has felt ok for the last several days and has not checked his HR. Dr. Recardo Evangelist in and attempted overdrive pacing, see his note above. Unfortunately, he did not return  to SR but to a slower afib at a rate of 90 bpm.  Bp came up into the 77'O systolic with pt feeling better. Denies being lightheaded now. He had a prior afib  ablation 04/2017. After discussion with Dr. Loletha Grayer. He will be started on amiodarone since BP is a concern.   F/u 7/8. He remains in a flutter by EKG at 102 bpm, but by pulse ox HR is in the 80's to low 90's. He had an episode of sweating after his shower this am but sat under the fan to cool off. He is fatigued. He continues on amiodarone 200 mg bid. He wanted to go to the beach today but I would feel better if he would  wait a week.   F/u 03/28/20. He continues in rate controlled afib. Still loading on amiodarone. He will be on amiodarone x 4 weeks on 8/2 and will eligible for cardioversion after that date. He wishes to  go back to the beach next  week and wants to set this up for the week of August 9 th.   F/u in afib clinic 04/18/21. Since I last saw pt a year ago, he has had more issues with afib and one month ago had a repeat  ablation. He is here for one month f/u. Unfortunately, he is a rate controlled atrial flutter. He has not seen any improvement from this ablation although he is able to do his activities without any fatigue or unusual fatigue. I discussed with Dr. Curt Bears and he would like him to increase amiodarone and see back in the next 2 weeks to see if this can convert pt. I will also ask the pt to send a device report to see if he is persistent or paroxysmal.   Follow up in the AF clinic 04/29/21. Patient remains in rate controlled atrial flutter. Patient states that he feels about the same with no change in his energy level. He denies any bleeding issues on anticoagulation.   F/u in afib clinic, 07/30/21. He was set up for cardioversion by Dr. Loletha Grayer after he unsuccessfully  tried to pace him out of flutter. The cardioversion was cancelled pre procedure as Joey with [employer] was able to pace him out. He remains in av paced rhythm today. He has continued on amiodarone 200 mg bid but will reduce to 200 mg daily now.   Today, he denies symptoms of palpitations, chest pain lightheadedness, no   shortness of  breath, orthopnea, PND, lower extremity edema, dizziness, presyncope, syncope, or neurologic sequela.+ fatigue. The patient is tolerating medications without difficulties and is otherwise without complaint today.   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   guidant  [facility] [employer] [phone])   Sinus node dysfunction (HCC)    Sleep apnea    CPAP   Wears hearing aid in both ears    Past Surgical History:  Procedure Laterality Date   ATRIAL FIBRILLATION ABLATION N/A 04/03/2017   Procedure: Atrial Fibrillation Ablation;  Surgeon: Constance Haw, MD;  Location: [place] [facility];  Service: Cardiovascular;  Laterality: N/A;   ATRIAL FIBRILLATION  ABLATION N/A 03/01/2021   Procedure: ATRIAL FIBRILLATION ABLATION;  Surgeon: Constance Haw, MD;  Location: [place] [facility];  Service: Cardiovascular;  Laterality: N/A;   CARDIAC CATHETERIZATION  10/13/1994   No CAD   CARDIAC CATHETERIZATION  05/10/2003   No CAD   CARDIOVERSION N/A 10/21/2016   Procedure: CARDIOVERSION;  Surgeon: Sanda Klein, MD;  Location: [place];  Service: Cardiovascular;  Laterality: N/A;   CARDIOVERSION N/A 05/15/2017   Procedure: CARDIOVERSION;  Surgeon: Fay Records, MD;  Location: [place];  Service: Cardiovascular;  Laterality: N/A;   CARDIOVERSION N/A 10/21/2019   Procedure: CARDIOVERSION;  Surgeon: Sanda Klein, MD;  Location: [place];  Service: Cardiovascular;  Laterality: N/A;   CARDIOVERSION N/A 02/24/2020   Procedure: CARDIOVERSION;  Surgeon: Lelon Perla, MD;  Location: [facility] ENDOS[facility];  Service: Cardiovascular;  Laterality: N/A;   CARDIOVERSION N/A 04/11/2020   Procedure: CARDIOVERSION;  Surgeon: Skeet Latch, MD;  Location: [place];  Service: Cardiovascular;  Laterality: N/A;   CARDIOVERSION N/A 12/26/2020   Procedure: CARDIOVERSION;  Surgeon: Sanda Klein, MD;  Location: [place];  Service: Cardiovascular;  Laterality: N/A;   CATARACT EXTRACTION W/PHACO Left 05/18/2019   Procedure: CATARACT EXTRACTION PHACO AND INTRAOCULAR LENS PLACEMENT ([place]) LEFT  1:21 11.0% 9.01;  Surgeon: Leandrew Koyanagi, MD;  Location: ;  Service: Ophthalmology;  Laterality: Left;  sleep apnea   CATARACT EXTRACTION W/PHACO Right 06/29/2019   Procedure: CATARACT EXTRACTION PHACO AND INTRAOCULAR LENS PLACEMENT ([facility]) RIGHT  01:14.2  14.5%  10.92;  Surgeon: Leandrew Koyanagi, MD;  Location: [place];  Service: Ophthalmology;  Laterality: Right;  sleep apnea   CYST EXCISION  03/2019   on back    NM MYOCAR PERF WALL MOTION  05/07/2011   Lexiscan: No ishcemia   PERMANENT PACEMAKER INSERTION  03/12/2010   guidant   PPM  GENERATOR CHANGEOUT N/A 08/06/2020   Procedure: PPM GENERATOR CHANGEOUT;  Surgeon: Sanda Klein, MD;  Location: [place] [facility];  Service: Cardiovascular;  Laterality: N/A;   ROTATOR CUFF REPAIR  1996   SHOULDER ARTHROSCOPY     TEE WITHOUT CARDIOVERSION N/A 03/01/2021   Procedure: TRANSESOPHAGEAL ECHOCARDIOGRAM (TEE);  Surgeon: Constance Haw, MD;  Location: [place] [facility];  Service: Cardiovascular;  Laterality: N/A;   US ECHOCARDIOGRAPHY  05/07/2011   mod. LVH,LA mod. dilated,borderline aortic root dilatation    Current Outpatient Medications  Medication Sig Dispense Refill   acetaminophen (TYLENOL) 650 MG CR tablet Take 1,300 mg by mouth every 8 (eight) hours as needed for pain.     allopurinol (ZYLOPRIM) 300 MG tablet Take 1 tablet (300 mg total) by mouth daily. 30 tablet 2   amiodarone (  PACERONE) 200 MG tablet Take 1 tablet (200 mg total) by mouth in the morning and at bedtime. 180 tablet 3   apixaban (ELIQUIS) 5 MG TABS tablet Take 1 tablet (5 mg total) by mouth 2 (two) times daily. 180 tablet 3   Apoaequorin (PREVAGEN PO) Take 1 tablet by mouth daily.     ARTIFICIAL TEAR SOLUTION OP Place 1 drop into both eyes daily as needed (dry eyes).     Ascorbic Acid (VITAMIN C) 1000 MG tablet Take 1,000 mg by mouth daily.     Cholecalciferol (VITAMIN D-3 PO) Take 2,500 Units by mouth at bedtime.     citalopram (CELEXA) 40 MG tablet Take 1 tablet (40 mg total) by mouth daily. MUST RECEIVE FUTURE REFILLS FROM PCP. 30 tablet 0   colchicine 0.6 MG tablet Take 0.5 tablets (0.3 mg total) by mouth daily as needed. 30 tablet 2   diltiazem (CARDIZEM CD) 120 MG 24 hr capsule Take 1 capsule (120 mg total) by mouth daily. 90 capsule 1   fluticasone (FLONASE) 50 MCG/ACT nasal spray Place 2 sprays into both nostrils daily as needed for rhinitis.     furosemide (LASIX) 40 MG tablet Take 1.5 tablets (60 mg total) by mouth daily. 45 tablet 11   methylcellulose oral powder Take 15 mLs by mouth daily  with supper.     metoprolol tartrate (LOPRESSOR) 100 MG tablet Take 1 tablet (100 mg total) by mouth once for 1 dose. Please take one time dose 100mg  metoprolol tartrate 2 hr prior to cardiac CT for HR control IF HR >55bpm. 1 tablet 0   montelukast (SINGULAIR) 10 MG tablet Take 10 mg by mouth at bedtime.     Multiple Vitamin (MULTIVITAMIN WITH MINERALS) TABS tablet Take 1 tablet by mouth daily. Senior     Omega-3 Fatty Acids (FISH OIL) 1200 MG CAPS Take 1,200 mg by mouth 2 (two) times daily.      Phenylephrine-APAP-guaiFENesin (MUCINEX SINUS-MAX CONG & PAIN PO) Take 2 tablets by mouth daily as needed (sinus headaches).      potassium chloride SA (KLOR-CON) 20 MEQ tablet Take 1 tablet (20 mEq total) by mouth daily. 90 tablet 3   pyridOXINE (VITAMIN B-6) 100 MG tablet Take 100 mg by mouth daily.     sodium chloride (OCEAN) 0.65 % SOLN nasal spray Place 1 spray into both nostrils daily as needed for congestion.     tamsulosin (FLOMAX) 0.4 MG CAPS capsule Take 1 capsule (0.4 mg total) by mouth daily. 30 capsule 11   Testosterone 1.62 % GEL Apply 3 Pump topically daily. Shoulders  1   vitamin B-12 (CYANOCOBALAMIN) 1000 MCG tablet Take 1,000 mcg by mouth daily.     zinc gluconate 50 MG tablet Take 50 mg by mouth daily.     No current facility-administered medications for this encounter.    No Known Allergies  Social History   Socioeconomic History   Marital status: Married    Spouse name: Not on file   Number of children: Not on file   Years of education: Not on file   Highest education level: Not on file  Occupational History   Not on file  Tobacco Use   Smoking status: Former    Types: Cigars    Quit date: 05/11/1993    Years since quitting: 28.2   Smokeless tobacco: Former    Types: Chew    Quit date: 05/11/1993   Tobacco comments:    Former smoker 04/29/21  Vaping Use  Vaping Use: Never used  Substance and Sexual Activity   Alcohol use: Not Currently    Comment: social   Drug  use: No   Sexual activity: Not on file  Other Topics Concern   Not on file  Social History Narrative   Not on file   Social Determinants of Health   Financial Resource Strain: Not on file  Food Insecurity: Not on file  Transportation Needs: Not on file  Physical Activity: Not on file  Stress: Not on file  Social Connections: Not on file  Intimate Partner Violence: Not on file    Family History  Problem Relation Age of Onset   Stroke Mother    Heart failure Mother    Alzheimer's disease Father    Heart disease Sister    Dementia Sister     ROS- All systems are reviewed and negative except as per the HPI above  Physical Exam: There were no vitals filed for this visit.   Wt Readings from Last 3 Encounters:  06/26/21 107.5 kg  06/18/21 108 kg  04/29/21 109.6 kg    Labs: Lab Results  Component Value Date   NA 137 06/18/2021   K 4.8 06/18/2021   CL 97 06/18/2021   CO2 28 06/18/2021   GLUCOSE 74 06/18/2021   BUN 13 06/18/2021   CREATININE 0.95 06/18/2021   CALCIUM 9.4 06/18/2021   MG 2.0 04/22/2017   Lab Results  Component Value Date   INR 1.1 10/14/2016   No results found for: CHOL, HDL, LDLCALC, TRIG  GEN- The patient is a well appearing obese elderly male, alert and oriented x 3 today.   HEENT-head normocephalic, atraumatic, sclera clear, conjunctiva pink, hearing intact, trachea midline. Lungs- Clear to ausculation bilaterally, normal work of breathing Heart- Regular rate and rhythm, no murmurs, rubs or gallops  GI- soft, NT, ND, + BS Extremities- no clubbing, cyanosis, or edema MS- no significant deformity or atrophy Skin- no rash or lesion Psych- euthymic mood, full affect Neuro- strength and sensation are intact  Vent. rate 60 BPM PR interval 148 ms QRS duration 218 ms QT/QTcB 560/560 ms P-R-T axes * -80 85 AV dual-paced rhythm Abnormal ECG    Assessment and Plan: 1. Persistent Afib/atrial flutter  S/p ablation in 2018 and repeat  ablation 03/01/21 Went back into atrial flutter shortly after ablation but wanted to defer cardioversion,  amiodarone increased to 200 mg bid  He was set up for cardioversion by Dr. Loletha Grayer for 10/26 but waas able to be paced back into rhythm  He remains in AV paced rhythm today  Reduce amiodarone to 200 mg qd  Continue  eliquis 5 mg BID Continue diltiazem 120 mg qd   2. CHA2DS2VASc score of 4 Continue  eiquis 5 mg bid  3. Tachybradycardia syndrome S/p PPM, followed by Dr Curt Bears and the device clinic.  4. OSA The importance of adequate treatment of sleep apnea was discussed today in order to improve our ability to maintain sinus rhythm long term. Encouraged compliance with CPAP therapy.  He never had f/u after ablation with Dr. Curt Bears, will request appointment today     Geroge Baseman. Rithwik Schmieg, [facility] [address] [place], [place] [address] [phone]

## 2021-07-30 NOTE — Patient Instructions (Signed)
Decrease Amiodarone to 200mg once a day 

## 2021-07-31 NOTE — Addendum Note (Signed)
Encounter addended by: Sherran Needs, NP on: 07/31/2021 9:34 AM  Actions taken: Diagnosis association updated, Results reviewed in IB

## 2021-09-05 ENCOUNTER — Ambulatory Visit (INDEPENDENT_AMBULATORY_CARE_PROVIDER_SITE_OTHER): Payer: BC Managed Care – PPO

## 2021-09-05 DIAGNOSIS — I495 Sick sinus syndrome: Secondary | ICD-10-CM | POA: Diagnosis not present

## 2021-09-06 LAB — CUP PACEART REMOTE DEVICE CHECK
Battery Remaining Longevity: 138 mo
Battery Remaining Percentage: 100 %
Brady Statistic RA Percent Paced: 95 %
Brady Statistic RV Percent Paced: 100 %
Date Time Interrogation Session: 20230104100400
Implantable Lead Implant Date: 20041025
Implantable Lead Implant Date: 20041025
Implantable Lead Location: 753859
Implantable Lead Location: 753860
Implantable Lead Model: 4457
Implantable Lead Model: 4480
Implantable Lead Serial Number: 338209
Implantable Lead Serial Number: 424134
Implantable Pulse Generator Implant Date: 20211206
Lead Channel Impedance Value: 485 Ohm
Lead Channel Impedance Value: 532 Ohm
Lead Channel Setting Pacing Amplitude: 2.5 V
Lead Channel Setting Pacing Amplitude: 2.5 V
Lead Channel Setting Pacing Pulse Width: 0.4 ms
Lead Channel Setting Sensing Sensitivity: 3 mV
Pulse Gen Serial Number: 955727

## 2021-09-16 NOTE — Progress Notes (Signed)
Remote pacemaker transmission.   

## 2021-09-26 ENCOUNTER — Ambulatory Visit: Payer: BC Managed Care – PPO | Admitting: Cardiology

## 2021-11-29 ENCOUNTER — Other Ambulatory Visit: Payer: Self-pay | Admitting: Cardiovascular Disease

## 2021-12-05 ENCOUNTER — Ambulatory Visit (INDEPENDENT_AMBULATORY_CARE_PROVIDER_SITE_OTHER): Payer: PRIVATE HEALTH INSURANCE

## 2021-12-05 DIAGNOSIS — I495 Sick sinus syndrome: Secondary | ICD-10-CM

## 2021-12-05 LAB — CUP PACEART REMOTE DEVICE CHECK
Battery Remaining Longevity: 138 mo
Battery Remaining Percentage: 100 %
Brady Statistic RA Percent Paced: 96 %
Brady Statistic RV Percent Paced: 100 %
Date Time Interrogation Session: 20230406000100
Implantable Lead Implant Date: 20041025
Implantable Lead Implant Date: 20041025
Implantable Lead Location: 753859
Implantable Lead Location: 753860
Implantable Lead Model: 4457
Implantable Lead Model: 4480
Implantable Lead Serial Number: 338209
Implantable Lead Serial Number: 424134
Implantable Pulse Generator Implant Date: 20211206
Lead Channel Impedance Value: 506 Ohm
Lead Channel Impedance Value: 557 Ohm
Lead Channel Setting Pacing Amplitude: 2.5 V
Lead Channel Setting Pacing Amplitude: 2.5 V
Lead Channel Setting Pacing Pulse Width: 0.4 ms
Lead Channel Setting Sensing Sensitivity: 3 mV
Pulse Gen Serial Number: 955727

## 2021-12-20 NOTE — Progress Notes (Signed)
Remote pacemaker transmission.   

## 2021-12-23 ENCOUNTER — Ambulatory Visit (INDEPENDENT_AMBULATORY_CARE_PROVIDER_SITE_OTHER): Payer: BC Managed Care – PPO | Admitting: Cardiovascular Disease

## 2021-12-23 ENCOUNTER — Other Ambulatory Visit: Payer: Self-pay | Admitting: Cardiovascular Disease

## 2021-12-23 ENCOUNTER — Encounter: Payer: Self-pay | Admitting: Cardiovascular Disease

## 2021-12-23 VITALS — BP 102/56 | HR 60 | Ht 71.0 in | Wt 233.2 lb

## 2021-12-23 DIAGNOSIS — Z5181 Encounter for therapeutic drug level monitoring: Secondary | ICD-10-CM

## 2021-12-23 DIAGNOSIS — I4819 Other persistent atrial fibrillation: Secondary | ICD-10-CM | POA: Diagnosis not present

## 2021-12-23 DIAGNOSIS — E669 Obesity, unspecified: Secondary | ICD-10-CM

## 2021-12-23 DIAGNOSIS — I495 Sick sinus syndrome: Secondary | ICD-10-CM

## 2021-12-23 DIAGNOSIS — E668 Other obesity: Secondary | ICD-10-CM

## 2021-12-23 DIAGNOSIS — Z950 Presence of cardiac pacemaker: Secondary | ICD-10-CM

## 2021-12-23 DIAGNOSIS — D473 Essential (hemorrhagic) thrombocythemia: Secondary | ICD-10-CM

## 2021-12-23 DIAGNOSIS — I5032 Chronic diastolic (congestive) heart failure: Secondary | ICD-10-CM

## 2021-12-23 DIAGNOSIS — Z79899 Other long term (current) drug therapy: Secondary | ICD-10-CM

## 2021-12-23 NOTE — Patient Instructions (Signed)
Medication Instructions:  ?No changes ?*If you need a refill on your cardiac medications before your next appointment, please call your pharmacy* ? ? ?Lab Work: ?Your provider would like for you to have the following labs today: CMET and TSH ? ?If you have labs (blood work) drawn today and your tests are completely normal, you will receive your results only by: ?MyChart Message (if you have MyChart) OR ?A paper copy in the mail ?If you have any lab test that is abnormal or we need to change your treatment, we will call you to review the results. ? ? ?Testing/Procedures: ?None ordered ? ? ?Follow-Up: ?At [facility], you and your health needs are our priority.  As part of our continuing mission to provide you with exceptional heart care, we have created designated Provider Care Teams.  These Care Teams include your primary Cardiologist (physician) and Advanced Practice Providers (APPs -  Physician Assistants and Nurse Practitioners) who all work together to provide you with the care you need, when you need it. ? ?We recommend signing up for the patient portal called "MyChart".  Sign up information is provided on this After Visit Summary.  MyChart is used to connect with patients for Virtual Visits (Telemedicine).  Patients are able to view lab/test results, encounter notes, upcoming appointments, etc.  Non-urgent messages can be sent to your provider as well.   ?To learn more about what you can do with MyChart, go to [email or web address].   ? ?Your next appointment:   ?6 month(s) ? ?The format for your next appointment:   ?In Person ? ?Provider:   ?Dr. Sallyanne Kuster ? ?Important Information About Sugar ? ? ? ? ? ? ?

## 2021-12-23 NOTE — Progress Notes (Signed)
Patient ID: Shane [name], male   DOB: 1945-09-13, 76 y.o.   MRN: [id number] ? ?  ? ?Cardiology Office Note   ? ?Date:  12/23/2021  ? ?[name], DOB 04/09/1946, MRN [id number] ? ?PCP:  Albina Billet, MD  ?Cardiologist:   Sanda Klein, MD  ? ?Chief Complaint  ?Patient presents with  ? Atrial Flutter  ? Pacemaker Check  ?   ?  ? ? ?History of Present Illness:  ?Shane [name] is a 76 y.o. male who presents for recurrent persistent atypical atrial flutter.   ? ?He has a history of tachycardia-bradycardia syndrome, chronic diastolic heart failure (last echo April 2021 shows mildly depressed LVEF 45-50%), morbid obesity, obstructive sleep apnea on CPAP.  He underwent radiofrequency ablation for atrial fibrillation in August 2018 with a recurrent atrial fibrillation requiring cardioversion in February 2021 and June 2021, then started on amiodarone and underwent successful cardioversion with lasting benefit on April 11, 2020.  He underwent pacemaker generator change out last December ([place], August 06, 2020).  He had recurrent atrial fibrillation in March 2022, symptomatic with difficult rate control.  Amiodarone was initiated.  He had repeat ablation performed in July 2022.  He again returned with atrial flutter, this time well rate controlled.  The amiodarone dose was increased to 200 mg twice daily in mid August.  Overdrive pacing was unsuccessful in the office, but was successful when he presented for cardioversion later the same month.  He has maintained normal rhythm since that time.  Amiodarone has subsequently been reduced to 200 mg once daily. ? ?He feels well.  He has been spending a lot of time at the [place], on [place].  He is paying more attention to his diet and has lost some weight roughly 13 pounds in the last 6 months. ? ?He has no cardiovascular complaints.  Never been aware of the arrhythmia as palpitations. The patient specifically denies any chest pain at rest  exertion, dyspnea at rest or with exertion, orthopnea, paroxysmal nocturnal dyspnea, syncope, palpitations, focal neurological deficits, intermittent claudication, lower extremity edema, unexplained weight gain, cough, hemoptysis or wheezing.  He has noticed that he is less stable when he bends over to tie his shoelaces, has a tendency to roll forward.  He has easy bruising on his forearms but has not had any serious bleeding and has not had any falls.  He is compliant with CPAP and denies daytime hypersomnolence. ? ?Pacemaker interrogation shows normal device function.  Presenting rhythm is AV sequential pacing.  He has 96% atrial pacing and 100% ventricular pacing.  He has idioventricular escape rhythm at about 35 bpm but does not appear to have normal AV conduction anymore.  He has not had atrial fibrillation or atrial flutter.  He has some very brief episodes of mode switch.  These appear to be due to competitive pacing and are very brief lasting 5-10 seconds.  Not all electrograms are available for review, in particular not for an episode that lasted for 1 minute and 5 seconds last month. ? ?Past Medical History:  ?Diagnosis Date  ? Atrioventricular block   ? Degenerative joint disease   ? Gout   ? Paroxysmal atrial fibrillation (HCC)   ? Presence of permanent cardiac pacemaker 03/12/10  ? guidant  ([place])  ? Sinus node dysfunction (HCC)   ? Sleep apnea   ? CPAP  ? Wears hearing aid in both ears   ? ? ?Past  Surgical History:  ?Procedure Laterality Date  ? ATRIAL FIBRILLATION ABLATION N/A 04/03/2017  ? Procedure: Atrial Fibrillation Ablation;  Surgeon: Constance Haw, MD;  Location: [place] [facility];  Service: Cardiovascular;  Laterality: N/A;  ? ATRIAL FIBRILLATION ABLATION N/A 03/01/2021  ? Procedure: ATRIAL FIBRILLATION ABLATION;  Surgeon: Constance Haw, MD;  Location: [place] [facility];  Service: Cardiovascular;  Laterality: N/A;  ? CARDIAC CATHETERIZATION  10/13/1994  ? No CAD  ?  CARDIAC CATHETERIZATION  05/10/2003  ? No CAD  ? CARDIOVERSION N/A 10/21/2016  ? Procedure: CARDIOVERSION;  Surgeon: Sanda Klein, MD;  Location: [place] ENDOSCOPY;  Service: Cardiovascular;  Laterality: N/A;  ? CARDIOVERSION N/A 05/15/2017  ? Procedure: CARDIOVERSION;  Surgeon: Fay Records, MD;  Location: [place];  Service: Cardiovascular;  Laterality: N/A;  ? CARDIOVERSION N/A 10/21/2019  ? Procedure: CARDIOVERSION;  Surgeon: Sanda Klein, MD;  Location: [place];  Service: Cardiovascular;  Laterality: N/A;  ? CARDIOVERSION N/A 02/24/2020  ? Procedure: CARDIOVERSION;  Surgeon: Lelon Perla, MD;  Location: [place];  Service: Cardiovascular;  Laterality: N/A;  ? CARDIOVERSION N/A 04/11/2020  ? Procedure: CARDIOVERSION;  Surgeon: Skeet Latch, MD;  Location: [place];  Service: Cardiovascular;  Laterality: N/A;  ? CARDIOVERSION N/A 12/26/2020  ? Procedure: CARDIOVERSION;  Surgeon: Sanda Klein, MD;  Location: [place];  Service: Cardiovascular;  Laterality: N/A;  ? CATARACT EXTRACTION W/PHACO Left 05/18/2019  ? Procedure: CATARACT EXTRACTION PHACO AND INTRAOCULAR LENS PLACEMENT ([facility]) LEFT  1:21 11.0% 9.01;  Surgeon: Leandrew Koyanagi, MD;  Location: [place];  Service: Ophthalmology;  Laterality: Left;  sleep apnea  ? CATARACT EXTRACTION W/PHACO Right 06/29/2019  ? Procedure: CATARACT EXTRACTION PHACO AND INTRAOCULAR LENS PLACEMENT ([facility]) RIGHT  01:14.2  14.5%  10.92;  Surgeon: Leandrew Koyanagi, MD;  Location: [place];  Service: Ophthalmology;  Laterality: Right;  sleep apnea  ? CYST EXCISION  03/2019  ? on back   ? NM MYOCAR PERF WALL MOTION  05/07/2011  ? Lexiscan: No ishcemia  ? PERMANENT PACEMAKER INSERTION  03/12/2010  ? guidant  ? PPM GENERATOR CHANGEOUT N/A 08/06/2020  ? Procedure: PPM GENERATOR CHANGEOUT;  Surgeon: Sanda Klein, MD;  Location: [place] [facility];  Service: Cardiovascular;  Laterality: N/A;  ? [place]  ? SHOULDER  ARTHROSCOPY    ? TEE WITHOUT CARDIOVERSION N/A 03/01/2021  ? Procedure: TRANSESOPHAGEAL ECHOCARDIOGRAM (TEE);  Surgeon: Constance Haw, MD;  Location: [place] [facility];  Service: Cardiovascular;  Laterality: N/A;  ? US ECHOCARDIOGRAPHY  05/07/2011  ? mod. LVH,LA mod. dilated,borderline aortic root dilatation  ? ? ?Outpatient Medications Prior to Visit  ?Medication Sig Dispense Refill  ? allopurinol (ZYLOPRIM) 300 MG tablet Take 1 tablet (300 mg total) by mouth daily. 30 tablet 2  ? amiodarone (PACERONE) 200 MG tablet Take 1 tablet (200 mg total) by mouth daily. 90 tablet 2  ? apixaban (ELIQUIS) 5 MG TABS tablet Take 1 tablet (5 mg total) by mouth 2 (two) times daily. 180 tablet 3  ? Apoaequorin (PREVAGEN PO) Take 1 tablet by mouth daily.    ? Ascorbic Acid (VITAMIN C) 1000 MG tablet Take 1,000 mg by mouth daily.    ? Cholecalciferol (VITAMIN D-3 PO) Take 2,500 Units by mouth at bedtime.    ? citalopram (CELEXA) 40 MG tablet Take 1 tablet (40 mg total) by mouth daily. MUST RECEIVE FUTURE REFILLS FROM PCP. 30 tablet 0  ? furosemide (LASIX) 40 MG tablet Take 1.5 tablets (  60 mg total) by mouth daily. 45 tablet 0  ? methylcellulose oral powder Take 15 mLs by mouth daily with supper.    ? montelukast (SINGULAIR) 10 MG tablet Take 10 mg by mouth at bedtime.    ? Multiple Vitamin (MULTIVITAMIN WITH MINERALS) TABS tablet Take 1 tablet by mouth daily. Senior    ? Omega-3 Fatty Acids (FISH OIL) 1200 MG CAPS Take 1,200 mg by mouth 2 (two) times daily.     ? potassium chloride SA (KLOR-CON) 20 MEQ tablet Take 1 tablet (20 mEq total) by mouth daily. 90 tablet 3  ? pyridOXINE (VITAMIN B-6) 100 MG tablet Take 100 mg by mouth daily.    ? tamsulosin (FLOMAX) 0.4 MG CAPS capsule Take 1 capsule (0.4 mg total) by mouth daily. 30 capsule 0  ? Testosterone 1.62 % GEL Apply 3 Pump topically daily. Shoulders  1  ? vitamin B-12 (CYANOCOBALAMIN) 1000 MCG tablet Take 1,000 mcg by mouth daily.    ? zinc gluconate 50 MG tablet Take 50 mg by  mouth daily.    ? acetaminophen (TYLENOL) 650 MG CR tablet Take 1,300 mg by mouth every 8 (eight) hours as needed for pain. (Patient not taking: Reported on 12/23/2021)    ? allopurinol (ZYLOPRIM) 100 MG tablet Ta

## 2021-12-24 LAB — COMPREHENSIVE METABOLIC PANEL
ALT: 18 IU/L (ref 0–44)
AST: 25 IU/L (ref 0–40)
Albumin/Globulin Ratio: 1.3 (ref 1.2–2.2)
Albumin: 3.9 g/dL (ref 3.7–4.7)
Alkaline Phosphatase: 101 IU/L (ref 44–121)
BUN/Creatinine Ratio: 10 (ref 10–24)
BUN: 9 mg/dL (ref 8–27)
Bilirubin Total: 0.7 mg/dL (ref 0.0–1.2)
CO2: 27 mmol/L (ref 20–29)
Calcium: 9.3 mg/dL (ref 8.6–10.2)
Chloride: 100 mmol/L (ref 96–106)
Creatinine, Ser: 0.94 mg/dL (ref 0.76–1.27)
Globulin, Total: 3 g/dL (ref 1.5–4.5)
Glucose: 86 mg/dL (ref 70–99)
Potassium: 4.9 mmol/L (ref 3.5–5.2)
Sodium: 140 mmol/L (ref 134–144)
Total Protein: 6.9 g/dL (ref 6.0–8.5)
eGFR: 83 mL/min/{1.73_m2} (ref >59)

## 2021-12-24 LAB — TSH: TSH: 4.06 u[IU]/mL (ref 0.450–4.500)

## 2021-12-25 ENCOUNTER — Encounter: Payer: Self-pay | Admitting: *Deleted

## 2022-01-22 ENCOUNTER — Other Ambulatory Visit: Payer: Self-pay | Admitting: Cardiovascular Disease

## 2022-01-22 ENCOUNTER — Other Ambulatory Visit: Payer: Self-pay | Admitting: Orthopedic Surgery

## 2022-01-22 DIAGNOSIS — M25511 Pain in right shoulder: Secondary | ICD-10-CM

## 2022-01-22 DIAGNOSIS — M75121 Complete rotator cuff tear or rupture of right shoulder, not specified as traumatic: Secondary | ICD-10-CM

## 2022-01-29 ENCOUNTER — Other Ambulatory Visit: Payer: Self-pay | Admitting: Orthopedic Surgery

## 2022-01-29 DIAGNOSIS — M75121 Complete rotator cuff tear or rupture of right shoulder, not specified as traumatic: Secondary | ICD-10-CM

## 2022-01-30 ENCOUNTER — Ambulatory Visit
Admission: RE | Admit: 2022-01-30 | Discharge: 2022-01-30 | Disposition: A | Discharge status: Home / Self Care | Payer: BC Managed Care – PPO | Source: Ambulatory Visit | Attending: Orthopedic Surgery | Admitting: Orthopedic Surgery

## 2022-01-30 ENCOUNTER — Other Ambulatory Visit: Payer: Self-pay | Admitting: Cardiovascular Disease

## 2022-01-30 DIAGNOSIS — M75121 Complete rotator cuff tear or rupture of right shoulder, not specified as traumatic: Secondary | ICD-10-CM

## 2022-01-30 MED ORDER — IOPAMIDOL (ISOVUE-M 200) INJECTION 41%
10.0000 mL | Freq: Once | INTRAMUSCULAR | Status: AC
  Administered 2022-01-30: 10 mL via INTRA_ARTICULAR

## 2022-01-30 MED ORDER — METHYLPREDNISOLONE ACETATE 40 MG/ML INJ SUSP (RADIOLOG
80.0000 mg | Freq: Once | INTRAMUSCULAR | Status: AC
  Administered 2022-01-30: 80 mg via INTRA_ARTICULAR

## 2022-03-06 ENCOUNTER — Ambulatory Visit (INDEPENDENT_AMBULATORY_CARE_PROVIDER_SITE_OTHER): Payer: BC Managed Care – PPO

## 2022-03-06 DIAGNOSIS — I495 Sick sinus syndrome: Secondary | ICD-10-CM

## 2022-03-08 LAB — CUP PACEART REMOTE DEVICE CHECK
Battery Remaining Longevity: 132 mo
Battery Remaining Percentage: 100 %
Brady Statistic RA Percent Paced: 95 %
Brady Statistic RV Percent Paced: 100 %
Date Time Interrogation Session: 20230707125300
Implantable Lead Implant Date: 20041025
Implantable Lead Implant Date: 20041025
Implantable Lead Location: 753859
Implantable Lead Location: 753860
Implantable Lead Model: 4457
Implantable Lead Model: 4480
Implantable Lead Serial Number: 338209
Implantable Lead Serial Number: 424134
Implantable Pulse Generator Implant Date: 20211206
Lead Channel Impedance Value: 436 Ohm
Lead Channel Impedance Value: 499 Ohm
Lead Channel Setting Pacing Amplitude: 2.5 V
Lead Channel Setting Pacing Amplitude: 2.5 V
Lead Channel Setting Pacing Pulse Width: 0.4 ms
Lead Channel Setting Sensing Sensitivity: 3 mV
Pulse Gen Serial Number: 955727

## 2022-03-25 NOTE — Progress Notes (Signed)
Remote pacemaker transmission.   

## 2022-06-05 ENCOUNTER — Ambulatory Visit (INDEPENDENT_AMBULATORY_CARE_PROVIDER_SITE_OTHER): Payer: BC Managed Care – PPO

## 2022-06-05 DIAGNOSIS — I495 Sick sinus syndrome: Secondary | ICD-10-CM

## 2022-06-05 LAB — CUP PACEART REMOTE DEVICE CHECK
Battery Remaining Longevity: 132 mo
Battery Remaining Percentage: 100 %
Brady Statistic RA Percent Paced: 94 %
Brady Statistic RV Percent Paced: 100 %
Date Time Interrogation Session: 20231005000100
Implantable Lead Implant Date: 20041025
Implantable Lead Implant Date: 20041025
Implantable Lead Location: 753859
Implantable Lead Location: 753860
Implantable Lead Model: 4457
Implantable Lead Model: 4480
Implantable Lead Serial Number: 338209
Implantable Lead Serial Number: 424134
Implantable Pulse Generator Implant Date: 20211206
Lead Channel Impedance Value: 500 Ohm
Lead Channel Impedance Value: 526 Ohm
Lead Channel Setting Pacing Amplitude: 2.5 V
Lead Channel Setting Pacing Amplitude: 2.5 V
Lead Channel Setting Pacing Pulse Width: 0.4 ms
Lead Channel Setting Sensing Sensitivity: 3 mV
Pulse Gen Serial Number: 955727

## 2022-06-12 NOTE — Progress Notes (Signed)
Remote pacemaker transmission.   

## 2022-06-23 ENCOUNTER — Other Ambulatory Visit: Payer: Self-pay | Admitting: Cardiovascular Disease

## 2022-06-23 DIAGNOSIS — Z01812 Encounter for preprocedural laboratory examination: Secondary | ICD-10-CM

## 2022-06-23 DIAGNOSIS — I4819 Other persistent atrial fibrillation: Secondary | ICD-10-CM

## 2022-06-23 NOTE — Telephone Encounter (Signed)
Prescription refill request for Eliquis received. Indication:Afib Last office visit:4/23 Scr:0.9 Age: 76 Weight:105.8 kg  Prescription refilled

## 2022-07-10 IMAGING — CT CT SHOULDER*R* W/CM
1 of 2 series · 9 of 14 positions shown, 12 images · IV contrast (agent unspecified)
Comparison: None Available.

CLINICAL DATA: Chronic right shoulder pain and weakness. History of
prior surgery.

EXAM:
CT OF THE UPPER RIGHT EXTREMITY WITH CONTRAST (CT ARTHROGRAM)
TECHNIQUE: Multidetector CT imaging of the upper right extremity was performed
according to the standard protocol following intra-articular
contrast administration.
RADIATION DOSE REDUCTION: This exam was performed according to the
departmental dose-optimization program which includes automated
exposure control, adjustment of the mA and/or kV according to
patient size and/or use of iterative reconstruction technique.
CONTRAST:  See injection documentation.

[Series 5: thin soft · axial · 0.51mm/px · z∈[-652,-462]mm · 9 of 397 slices shown, 12 images]
[im 40/397  soft-tissue]
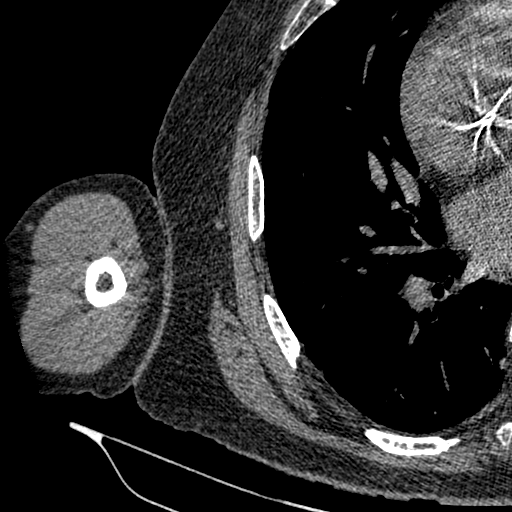
[im 40/397  bone]
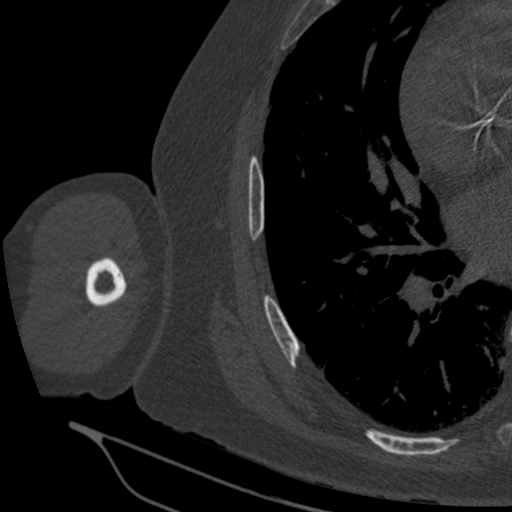
[im 80/397  bone]
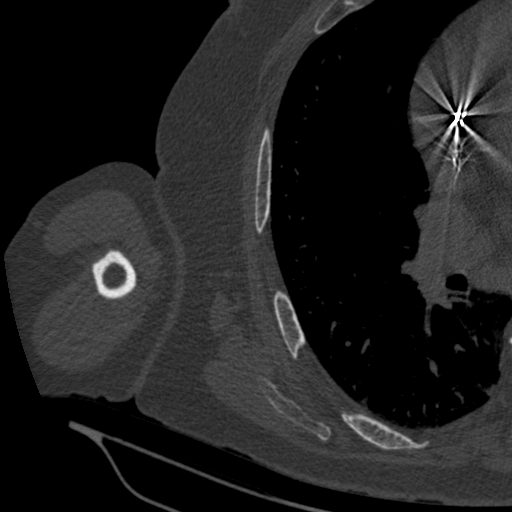
[im 119/397  bone]
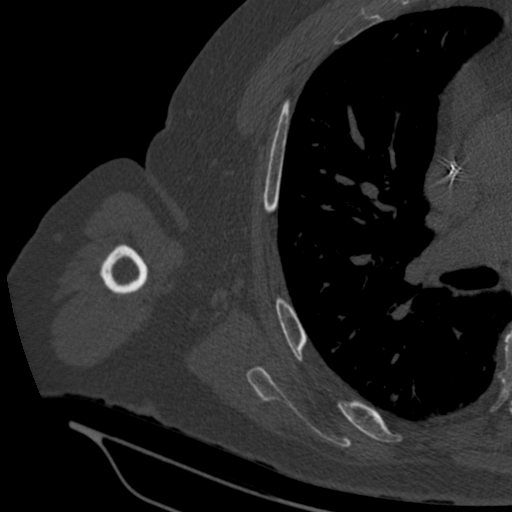
[im 159/397  bone]
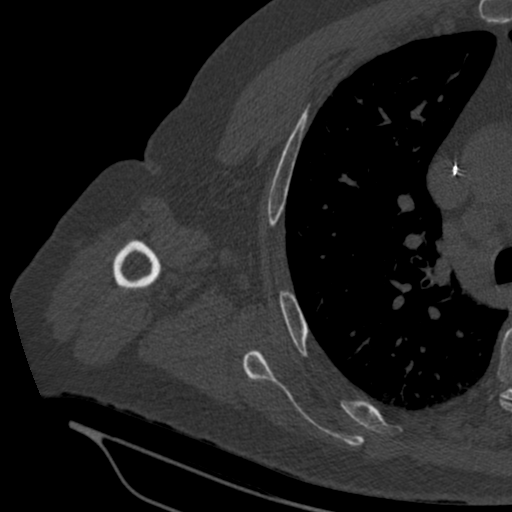
[im 199/397  soft-tissue]
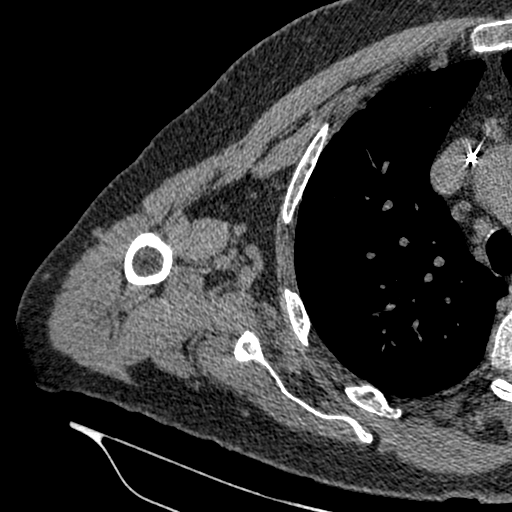
[im 199/397  bone]
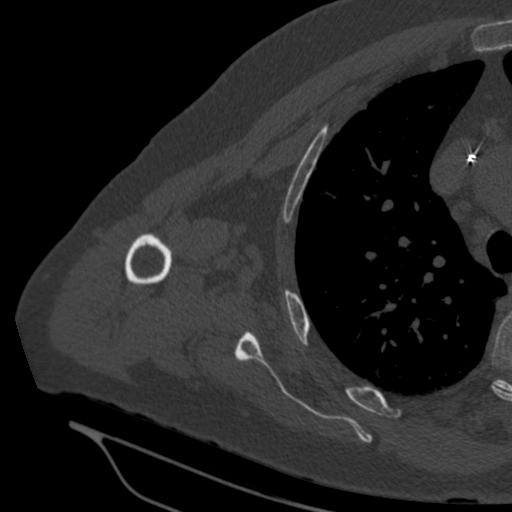
[im 238/397  bone]
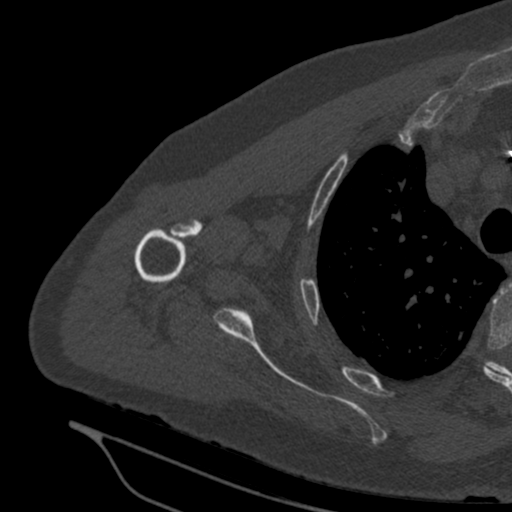
[im 278/397  bone]
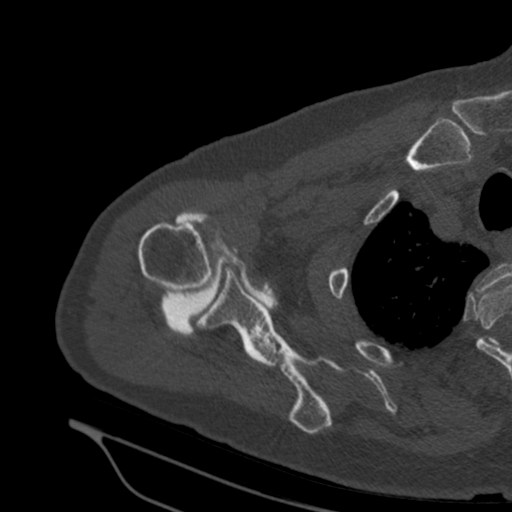
[im 317/397  bone]
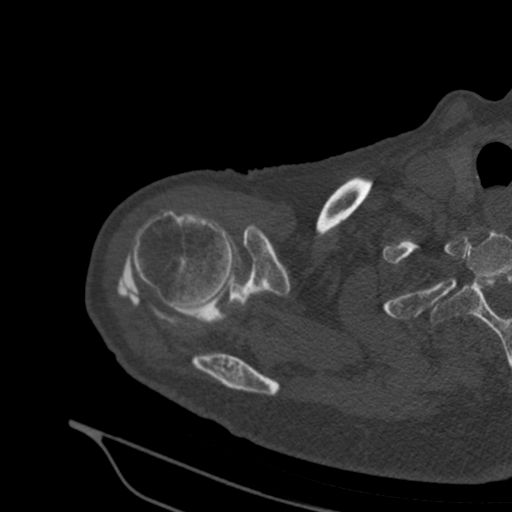
[im 357/397  soft-tissue]
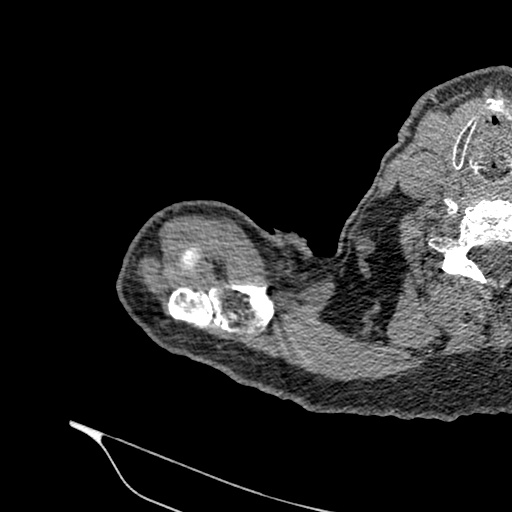
[im 357/397  bone]
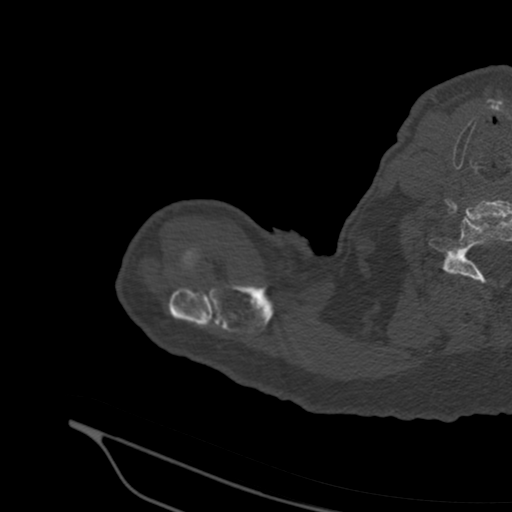

[9 of 14 positions shown; findings below may reference images not displayed]

FINDINGS: Rotator cuff: Full-thickness, partial width tears of the
supraspinatus and infraspinatus tendons, sparing the anterior
supraspinatus and posterior infraspinatus tendon fibers. Retraction
to the top of the humeral head. Full-thickness, full width tear of
the subscapularis tendon. The teres minor tendon is intact.

Muscles: Severe subscapularis and mild infraspinatus muscle atrophy.

Biceps long head:  Intact but medially dislocated.

Acromioclavicular Joint: The acromion is type I. There are mild
acromioclavicular degenerative changes. Contrast extends into the
subacromial/subdeltoid bursa

Glenohumeral Joint: Distended with intra-articular contrast. Diffuse
cartilage thinning with partial-thickness cartilage loss over the
inferior medial humeral head.

Labrum:  No evidence of labral tear.

Bones: Anterior subluxation of the humeral head with respect to the
glenoid, consistent with instability. High-riding humeral head. No
fracture. No suspicious bone lesion.

Other: Multiple small calcified and noncalcified pulmonary nodules
in the right lung measuring up to 4 mm, unchanged since 2976,
benign. Multiple prominent reactive mediastinal lymph nodes.
IMPRESSION: 1. Massive rotator cuff tear with full-thickness, partial width
tears of the supraspinatus and infraspinatus tendons, and
full-thickness, full width tear of the subscapularis tendon. Severe
subscapularis and mild infraspinatus muscle atrophy.
2. Medially dislocated biceps tendon.
3. Mild acromioclavicular and glenohumeral osteoarthritis.

## 2022-07-10 IMAGING — XA DG FLUORO GUIDE NDL PLC/BX
3 series · 3 of 3 positions shown · non-contrast
Comparison: none

CLINICAL DATA: Chronic right shoulder pain.

[Series 1: ortho adipose · 1 of 1 slices shown (1 of 3)]
[im 1/1]
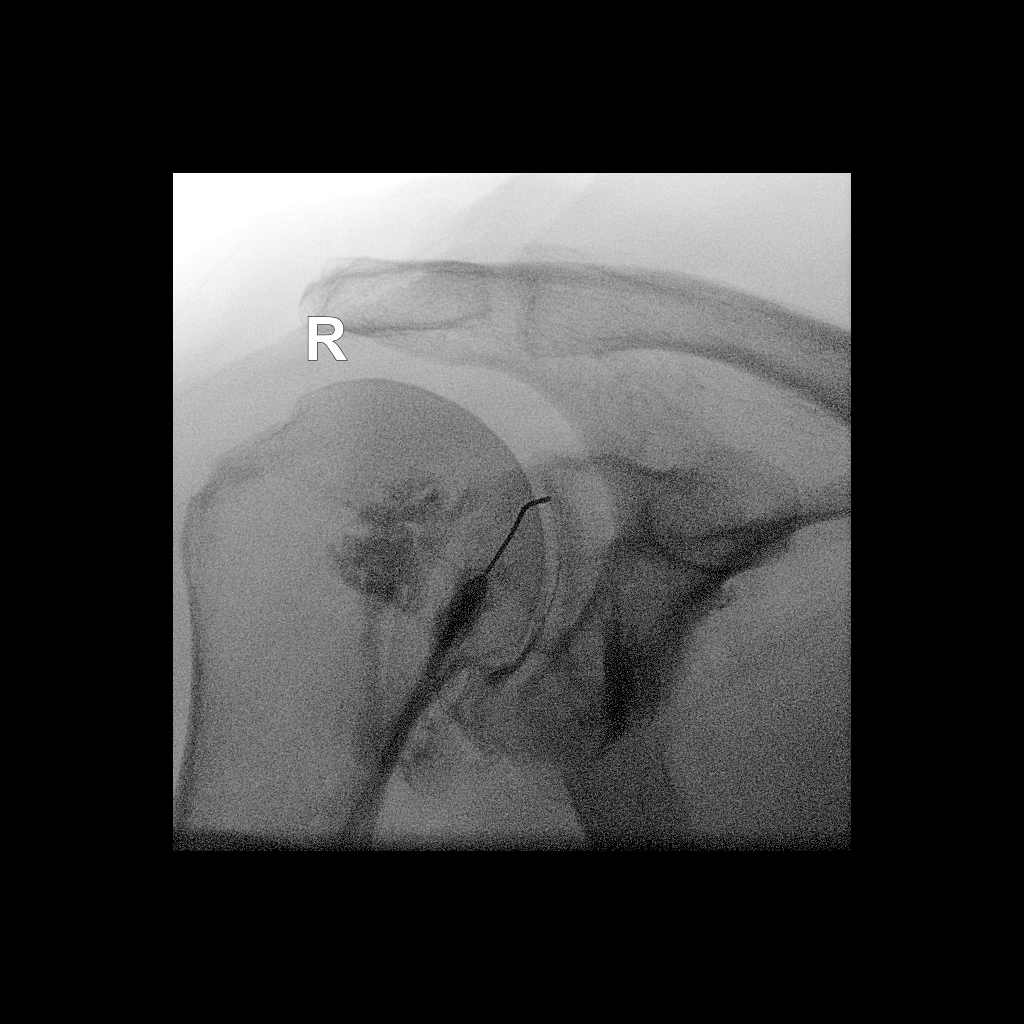

[Series 2: ortho adipose · 1 of 1 slices shown (2 of 3)]
[im 1/1]
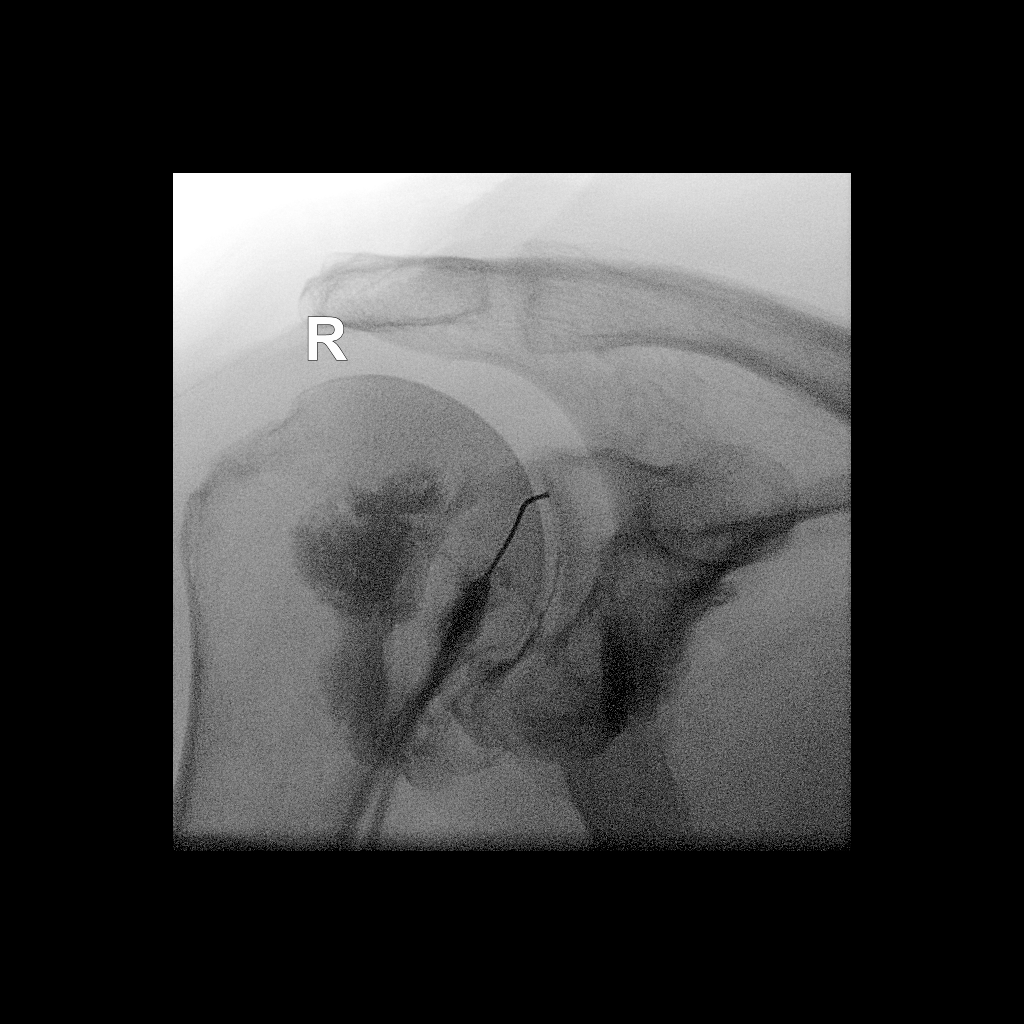

[Series 3: ortho adipose · 1 of 1 slices shown (3 of 3)]
[im 1/1]
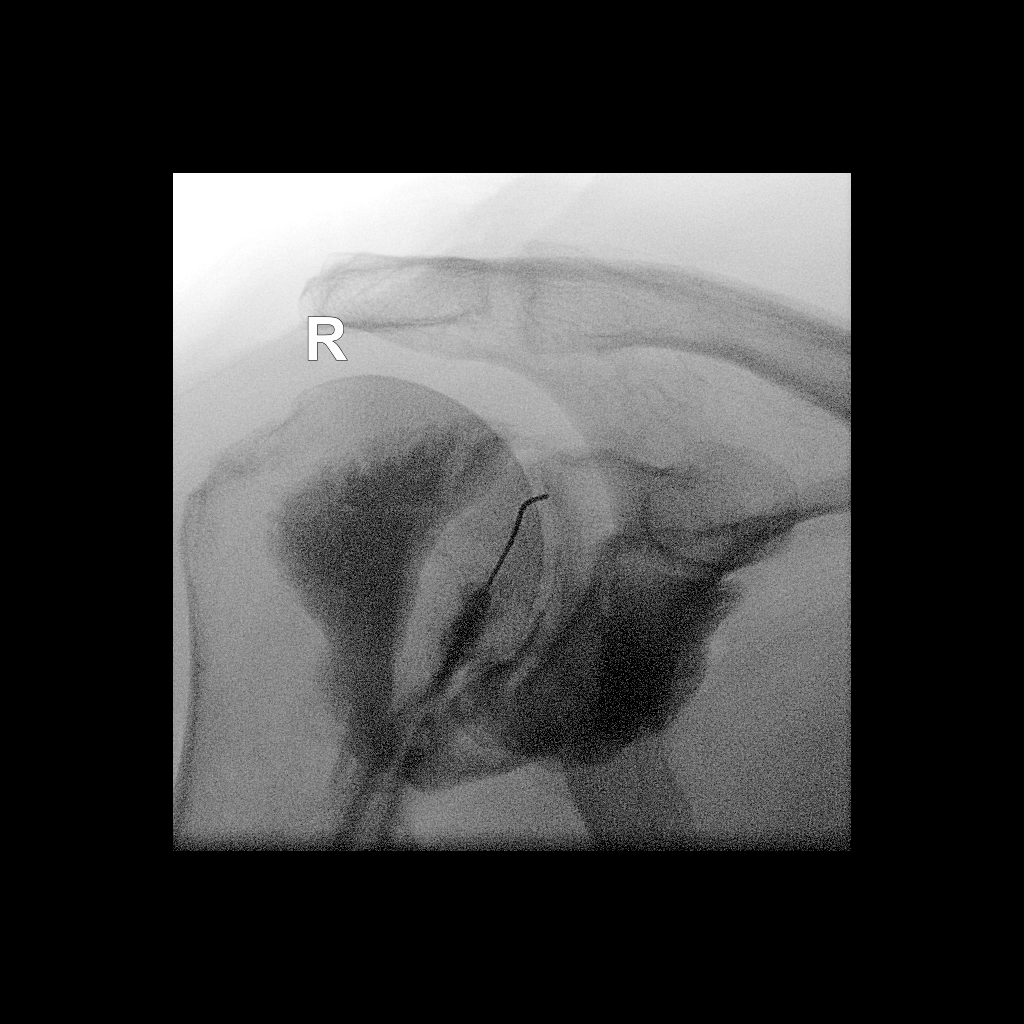

[3 of 3 positions shown; findings below may reference images not displayed]

FLUOROSCOPY TIME:  Radiation Exposure Index (as provided by the
fluoroscopic device): 0.6 mGy Kerma

PROCEDURE:
The risks and benefits of the procedure were discussed with the
patient, and written informed consent was obtained. The patient
stated no history of allergy to contrast media. A formal timeout
procedure was performed with the patient according to departmental
protocol.

The patient was placed supine on the fluoroscopy table and the right
glenohumeral joint was identified under fluoroscopy. The skin
overlying the right glenohumeral joint was subsequently cleaned with
Betadine and a sterile drape was placed over the area of interest. 2
ml 1% Lidocaine was used to anesthetize the skin around the needle
insertion site.

A 22 gauge spinal needle was inserted into the right glenohumeral
joint under fluoroscopy.

12 ml of contrast and steroid mixture (1 ml of Depomedrol (80
mg/ml), 4 ml of sterile saline, and 15 ml of Isovue-M 200 contrast)
were injected into the right glenohumeral joint. Total Depo-Medrol
dose administered into the joint was therefore 48 mg.

The needle was removed and hemostasis was achieved. The patient was
subsequently transferred to CT for imaging.
IMPRESSION: Technically successful right shoulder injection for CT, with
steroid.

## 2022-07-28 ENCOUNTER — Encounter: Payer: Self-pay | Admitting: Cardiovascular Disease

## 2022-07-28 ENCOUNTER — Ambulatory Visit: Payer: BC Managed Care – PPO | Attending: Cardiovascular Disease | Admitting: Cardiovascular Disease

## 2022-07-28 VITALS — BP 96/52 | HR 60 | Ht 71.0 in | Wt 223.6 lb

## 2022-07-28 DIAGNOSIS — I442 Atrioventricular block, complete: Secondary | ICD-10-CM | POA: Diagnosis not present

## 2022-07-28 DIAGNOSIS — I495 Sick sinus syndrome: Secondary | ICD-10-CM

## 2022-07-28 DIAGNOSIS — I5032 Chronic diastolic (congestive) heart failure: Secondary | ICD-10-CM

## 2022-07-28 DIAGNOSIS — E669 Obesity, unspecified: Secondary | ICD-10-CM

## 2022-07-28 DIAGNOSIS — I4819 Other persistent atrial fibrillation: Secondary | ICD-10-CM

## 2022-07-28 DIAGNOSIS — G4733 Obstructive sleep apnea (adult) (pediatric): Secondary | ICD-10-CM

## 2022-07-28 DIAGNOSIS — Z5181 Encounter for therapeutic drug level monitoring: Secondary | ICD-10-CM | POA: Diagnosis not present

## 2022-07-28 DIAGNOSIS — I952 Hypotension due to drugs: Secondary | ICD-10-CM

## 2022-07-28 DIAGNOSIS — Z79899 Other long term (current) drug therapy: Secondary | ICD-10-CM

## 2022-07-28 NOTE — Progress Notes (Signed)
Patient ID: Shane [name], male   DOB: [birth year], 76 y.o.   MRN: [id number]     Cardiology Office Note    Date:  08/03/2022   ID:  Keevon, Henney Jan 19, 1946, MRN [id number]  PCP:  Albina Billet, MD  Cardiologist:   Sanda Klein, MD   No chief complaint on file.   History of Present Illness:  Shane [name] is a 76 y.o. male who presents for recurrent persistent atypical atrial flutter.    He has a history of tachycardia-bradycardia syndrome, chronic diastolic heart failure (last echo April 2021 shows mildly depressed LVEF 45-50%), morbid obesity, obstructive sleep apnea on CPAP.  He underwent radiofrequency ablation for atrial fibrillation in August 2018 with a recurrent atrial fibrillation requiring cardioversion in February 2021 and June 2021, then started on amiodarone and underwent successful cardioversion with lasting benefit on April 11, 2020.  He underwent pacemaker generator change out last December ([place], August 06, 2020).  He had recurrent atrial fibrillation in March 2022, symptomatic with difficult rate control.  Amiodarone was initiated.  He had repeat ablation performed in July 2022.  He again returned with atrial flutter, this time well rate controlled.  The amiodarone dose was increased to 200 mg twice daily in mid August.  Overdrive pacing was unsuccessful in the office, but was successful when he presented for cardioversion later the same month.  He has maintained normal rhythm since that time.  Amiodarone has subsequently been reduced to 200 mg once daily.  Clair Gulling continues to feel quite well.  He does not have any palpitations.  He has not had fatigue, shortness of breath, orthopnea, PND, lower extremity edema or palpitations.  He denies dizziness or syncope.  He still complains of easy bruising, but he has not had any serious bleeding.  He has not had any falls.  Recently, he has not been using his CPAP.  He thought that since he lost some  weight this is no longer necessary.  However he has noticed that recently he has been unintentional falling asleep while reading.  Pacemaker interrogation shows normal device function.  Presenting rhythm is AV sequential pacing.  He has 94% atrial pacing and 100% ventricular pacing.  Today there was no evidence of any AV conduction and he did not have any escape rhythm when pacing was taken down to 30 bpm.  He has not had atrial fibrillation or atrial flutter.  He had some very rare episodes of mode switch, lasting 9 seconds or less.    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   guidant  [facility] [phone])   Sinus node dysfunction (HCC)    Sleep apnea    CPAP   Wears hearing aid in both ears     Past Surgical History:  Procedure Laterality Date   ATRIAL FIBRILLATION ABLATION N/A 04/03/2017   Procedure: Atrial Fibrillation Ablation;  Surgeon: Constance Haw, MD;  Location: [place] [facility];  Service: Cardiovascular;  Laterality: N/A;   ATRIAL FIBRILLATION ABLATION N/A 03/01/2021   Procedure: ATRIAL FIBRILLATION ABLATION;  Surgeon: Constance Haw, MD;  Location: [place] [facility];  Service: Cardiovascular;  Laterality: N/A;   CARDIAC CATHETERIZATION  10/13/1994   No CAD   CARDIAC CATHETERIZATION  05/10/2003   No CAD   CARDIOVERSION N/A 10/21/2016  Procedure: CARDIOVERSION;  Surgeon: Sanda Klein, MD;  Location: [facility];  Service: Cardiovascular;  Laterality: N/A;   CARDIOVERSION N/A 05/15/2017   Procedure: CARDIOVERSION;  Surgeon: Fay Records, MD;  Location: [place];  Service: Cardiovascular;  Laterality: N/A;   CARDIOVERSION N/A 10/21/2019   Procedure: CARDIOVERSION;  Surgeon: Sanda Klein, MD;  Location: [place];  Service: Cardiovascular;  Laterality: N/A;   CARDIOVERSION N/A 02/24/2020   Procedure:  CARDIOVERSION;  Surgeon: Lelon Perla, MD;  Location: [facility] ENDOSCOPY;  Service: Cardiovascular;  Laterality: N/A;   CARDIOVERSION N/A 04/11/2020   Procedure: CARDIOVERSION;  Surgeon: Skeet Latch, MD;  Location: [place];  Service: Cardiovascular;  Laterality: N/A;   CARDIOVERSION N/A 12/26/2020   Procedure: CARDIOVERSION;  Surgeon: Sanda Klein, MD;  Location: [place];  Service: Cardiovascular;  Laterality: N/A;   CATARACT EXTRACTION W/PHACO Left 05/18/2019   Procedure: CATARACT EXTRACTION PHACO AND INTRAOCULAR LENS PLACEMENT ([place]) LEFT  1:21 11.0% 9.01;  Surgeon: Leandrew Koyanagi, MD;  Location: [place];  Service: Ophthalmology;  Laterality: Left;  sleep apnea   CATARACT EXTRACTION W/PHACO Right 06/29/2019   Procedure: CATARACT EXTRACTION PHACO AND INTRAOCULAR LENS PLACEMENT ([place]) RIGHT  01:14.2  14.5%  10.92;  Surgeon: Leandrew Koyanagi, MD;  Location: [place];  Service: Ophthalmology;  Laterality: Right;  sleep apnea   CYST EXCISION  03/2019   on back    NM MYOCAR PERF WALL MOTION  05/07/2011   Lexiscan: No ishcemia   PERMANENT PACEMAKER INSERTION  03/12/2010   guidant   PPM GENERATOR CHANGEOUT N/A 08/06/2020   Procedure: PPM GENERATOR CHANGEOUT;  Surgeon: Sanda Klein, MD;  Location: [place] [facility];  Service: Cardiovascular;  Laterality: N/A;   ROTATOR CUFF REPAIR  1996   SHOULDER ARTHROSCOPY     TEE WITHOUT CARDIOVERSION N/A 03/01/2021   Procedure: TRANSESOPHAGEAL ECHOCARDIOGRAM (TEE);  Surgeon: Constance Haw, MD;  Location: [place] [facility];  Service: Cardiovascular;  Laterality: N/A;   US ECHOCARDIOGRAPHY  05/07/2011   mod. LVH,LA mod. dilated,borderline aortic root dilatation    Outpatient Medications Prior to Visit  Medication Sig Dispense Refill   allopurinol (ZYLOPRIM) 300 MG tablet Take 1 tablet (300 mg total) by mouth daily. 30 tablet 2   amiodarone (PACERONE) 200 MG tablet Take 1 tablet (200 mg total) by mouth daily.  90 tablet 2   apixaban (ELIQUIS) 5 MG TABS tablet Take 1 tablet (5 mg total) by mouth 2 (two) times daily. 180 tablet 1   Apoaequorin (PREVAGEN PO) Take 1 tablet by mouth daily.     Ascorbic Acid (VITAMIN C) 1000 MG tablet Take 1,000 mg by mouth daily.     Cholecalciferol (VITAMIN D-3 PO) Take 2,500 Units by mouth at bedtime.     citalopram (CELEXA) 40 MG tablet Take 1 tablet (40 mg total) by mouth daily. MUST RECEIVE FUTURE REFILLS FROM PCP. 30 tablet 0   furosemide (LASIX) 40 MG tablet Take 1.5 tablets (60 mg total) by mouth daily. 45 tablet 6   montelukast (SINGULAIR) 10 MG tablet Take 10 mg by mouth at bedtime.     Multiple Vitamin (MULTIVITAMIN WITH MINERALS) TABS tablet Take 1 tablet by mouth daily. Senior     Omega-3 Fatty Acids (FISH OIL) 1200 MG CAPS Take 1,200 mg by mouth 2 (two) times daily.      potassium chloride SA (KLOR-CON M) 20 MEQ tablet Take 1 tablet (20 mEq total) by mouth daily. 90 tablet 3   pyridOXINE (VITAMIN B-6) 100 MG tablet  Take 100 mg by mouth daily.     tamsulosin (FLOMAX) 0.4 MG CAPS capsule Take 1 capsule (0.4 mg total) by mouth daily. 30 capsule 6   Testosterone 1.62 % GEL Apply 3 Pump topically daily. Shoulders  1   vitamin B-12 (CYANOCOBALAMIN) 1000 MCG tablet Take 1,000 mcg by mouth daily.     zinc gluconate 50 MG tablet Take 50 mg by mouth daily.     colchicine 0.6 MG tablet Take 0.5 tablets (0.3 mg total) by mouth daily as needed. (Patient not taking: Reported on 07/28/2022) 30 tablet 2   methylcellulose oral powder Take 15 mLs by mouth daily with supper. (Patient not taking: Reported on 07/28/2022)     No facility-administered medications prior to visit.     Allergies:   Patient has no known allergies.   Social History   Socioeconomic History   Marital status: Married    Spouse name: Not on file   Number of children: Not on file   Years of education: Not on file   Highest education level: Not on file  Occupational History   Not on file  Tobacco  Use   Smoking status: Former    Types: Cigars    Quit date: 05/11/1993    Years since quitting: 29.2   Smokeless tobacco: Former    Types: Chew    Quit date: 05/11/1993   Tobacco comments:    Former smoker 04/29/21  Vaping Use   Vaping Use: Never used  Substance and Sexual Activity   Alcohol use: Not Currently    Comment: social   Drug use: No   Sexual activity: Not on file  Other Topics Concern   Not on file  Social History Narrative   Not on file   Social Determinants of Health   Financial Resource Strain: Not on file  Food Insecurity: Not on file  Transportation Needs: Not on file  Physical Activity: Not on file  Stress: Not on file  Social Connections: Not on file     Family History:  The patient's family history includes Alzheimer's disease in his father; Dementia in his sister; Heart disease in his sister; Heart failure in his mother; Stroke in his mother.   ROS:   Please see the history of present illness.   All other systems are reviewed and are negative.   PHYSICAL EXAM:   VS:  BP (!) 96/52 (BP Location: Left Arm, Patient Position: Sitting, Cuff Size: Large)   Pulse 60   Ht '5\' 11"'$  (1.803 m)   Wt 101.4 kg   SpO2 95%   BMI 31.19 kg/m      General: Alert, oriented x3, no distress, mildly obese, healthy pacemaker site Head: no evidence of trauma, PERRL, EOMI, no exophtalmos or lid lag, no myxedema, no xanthelasma; normal ears, nose and oropharynx Neck: normal jugular venous pulsations and no hepatojugular reflux; brisk carotid pulses without delay and no carotid bruits Chest: clear to auscultation, no signs of consolidation by percussion or palpation, normal fremitus, symmetrical and full respiratory excursions Cardiovascular: normal position and quality of the apical impulse, regular rhythm, normal first and paradoxically split second heart sounds, no murmurs, rubs or gallops Abdomen: no tenderness or distention, no masses by palpation, no abnormal pulsatility  or arterial bruits, normal bowel sounds, no hepatosplenomegaly Extremities: no clubbing, cyanosis or edema; 2+ radial, ulnar and brachial pulses bilaterally; 2+ right femoral, posterior tibial and dorsalis pedis pulses; 2+ left femoral, posterior tibial and dorsalis pedis pulses; no subclavian or  femoral bruits Neurological: grossly nonfocal Psych: Normal mood and affect    Wt Readings from Last 3 Encounters:  07/28/22 101.4 kg  12/23/21 105.8 kg  07/30/21 111.7 kg      Studies/Labs Reviewed:   EKG: ECG performed today shows AV sequentially paced rhythm.  QRS is quite broad at 226 ms.   ASSESSMENT:    1. Persistent atrial fibrillation ([place])   2. Tachycardia-bradycardia syndrome ([place])   3. CHB (complete heart block) (HCC)   4. Encounter for monitoring amiodarone therapy   5. Hypotension due to drugs   6. Obesity, mild   7. Chronic diastolic CHF (congestive heart failure) ([place])   8. OSA (obstructive sleep apnea)     PLAN:  In order of problems listed above:  AFib s/p RFA 2018/redo RFA August 2022/atypical atrial flutter: History of atypical atrial flutter, likely with a left atrial circuit, which has had both successful and unsuccessful attempts at overdrive pacing in the past.  Has not had any sustained arrhythmia since November 2022.  Compliant with anticoagulation.  CHA2DS2-VASc 4 (age 55, CHF, hypertension).   No history of embolic events.  SSS: For the most part he no longer has any sinus node activity due to the amiodarone.  Atrial paced heart rate histogram distribution is appropriate. 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 Amiodarone: Talked about the potential side effects of this medication again.  Will continue him on the lowest effective dose.  Needs to have liver and thyroid function tests every 6 months.  He reports he had labs with Dr. Hall Busing yesterday.   Hypertension: borderline low, but  asymptomatic.  He is on Flomax. Obesity: Congratulated him on weight loss and I think this is contributing to his successful maintenance of normal rhythm. PM: normal device function continue remote downloads every 3 months. CHF: Clinically euvolemic, NYHA functional class I, on a relatively small dose of loop diuretic.  His most recent echo from April 2021 showed a slight reduction in LVEF to 45-50%.  Hopefully this has improved now that he has maintained normal rhythm for a long period of time.  We have not formally retested LV function.  His blood pressure would really not allow ACE inhibitor/ARB/ARNI, spironolactone or beta-blockers. OSA: Face-to-face encounter for CPAP therapy today.  His durable medical equipment provider ([facility]) no longer provide support for CPAP.  He needs a new provider.  When he does not use CPAP he has daytime hypersomnolence.  He reports 100% compliance in the past.  Encouraged him to continue using CPAP.Marland Kitchen     Medication Adjustments/Labs and Tests Ordered: Current medicines are reviewed at length with the patient today.  Concerns regarding medicines are outlined above.  Medication changes, Labs and Tests ordered today are listed in the Patient Instructions below. Patient Instructions  Medication Instructions:  Your physician recommends that you continue on your current medications as directed. Please refer to the Current Medication list given to you today.  *If you need a refill on your cardiac medications before your next appointment, please call your pharmacy*   Lab Work: CMET, TSH today  If you have labs (blood work) drawn today and your tests are completely normal, you will receive your results only by: [place] (if you have MyChart) OR A paper copy in the mail If you have any lab test that is abnormal or we need to change your treatment, we will call you to review the  results.  Follow-Up: At [facility], you and your health needs are our  priority.  As part of our continuing mission to provide you with exceptional heart care, we have created designated Provider Care Teams.  These Care Teams include your primary Cardiologist (physician) and Advanced Practice Providers (APPs -  Physician Assistants and Nurse Practitioners) who all work together to provide you with the care you need, when you need it.  We recommend signing up for the patient portal called "MyChart".  Sign up information is provided on this After Visit Summary.  MyChart is used to connect with patients for Virtual Visits (Telemedicine).  Patients are able to view lab/test results, encounter notes, upcoming appointments, etc.  Non-urgent messages can be sent to your provider as well.   To learn more about what you can do with MyChart, go to [email or web address].    Your next appointment:   6 month(s)  The format for your next appointment:   In Person  Provider:   Dr. Sallyanne Kuster (device day)       Signed, Sanda Klein, MD  08/03/2022 4:28 PM    [address], [place], [place]  [address] Phone: [phone]; Fax: [phone]

## 2022-07-28 NOTE — Patient Instructions (Signed)
Medication Instructions:  Your physician recommends that you continue on your current medications as directed. Please refer to the Current Medication list given to you today.  *If you need a refill on your cardiac medications before your next appointment, please call your pharmacy*   Lab Work: CMET, TSH today  If you have labs (blood work) drawn today and your tests are completely normal, you will receive your results only by: [place] (if you have MyChart) OR A paper copy in the mail If you have any lab test that is abnormal or we need to change your treatment, we will call you to review the results.  Follow-Up: At [facility], you and your health needs are our priority.  As part of our continuing mission to provide you with exceptional heart care, we have created designated Provider Care Teams.  These Care Teams include your primary Cardiologist (physician) and Advanced Practice Providers (APPs -  Physician Assistants and Nurse Practitioners) who all work together to provide you with the care you need, when you need it.  We recommend signing up for the patient portal called "MyChart".  Sign up information is provided on this After Visit Summary.  MyChart is used to connect with patients for Virtual Visits (Telemedicine).  Patients are able to view lab/test results, encounter notes, upcoming appointments, etc.  Non-urgent messages can be sent to your provider as well.   To learn more about what you can do with MyChart, go to [email or web address].    Your next appointment:   6 month(s)  The format for your next appointment:   In Person  Provider:   Dr. Sallyanne Kuster (device day)

## 2022-08-03 DIAGNOSIS — I442 Atrioventricular block, complete: Secondary | ICD-10-CM | POA: Insufficient documentation

## 2022-08-04 ENCOUNTER — Encounter: Payer: Self-pay | Admitting: Cardiovascular Disease

## 2022-08-12 ENCOUNTER — Encounter: Payer: Self-pay | Admitting: *Deleted

## 2022-08-12 LAB — COMPREHENSIVE METABOLIC PANEL
ALT: 15 IU/L (ref 0–44)
AST: 19 IU/L (ref 0–40)
Albumin/Globulin Ratio: 1.4 (ref 1.2–2.2)
Albumin: 4.3 g/dL (ref 3.8–4.8)
Alkaline Phosphatase: 99 IU/L (ref 44–121)
BUN/Creatinine Ratio: 17 (ref 10–24)
BUN: 18 mg/dL (ref 8–27)
Bilirubin Total: 0.7 mg/dL (ref 0.0–1.2)
CO2: 29 mmol/L (ref 20–29)
Calcium: 9.6 mg/dL (ref 8.6–10.2)
Chloride: 98 mmol/L (ref 96–106)
Creatinine, Ser: 1.08 mg/dL (ref 0.76–1.27)
Globulin, Total: 3 g/dL (ref 1.5–4.5)
Glucose: 122 mg/dL — ABNORMAL HIGH (ref 70–99)
Potassium: 4 mmol/L (ref 3.5–5.2)
Sodium: 137 mmol/L (ref 134–144)
Total Protein: 7.3 g/dL (ref 6.0–8.5)
eGFR: 70 mL/min/{1.73_m2} (ref >59)

## 2022-08-12 LAB — TSH: TSH: 4.3 u[IU]/mL (ref 0.450–4.500)

## 2022-08-27 ENCOUNTER — Other Ambulatory Visit: Payer: Self-pay | Admitting: Cardiovascular Disease

## 2022-09-04 ENCOUNTER — Ambulatory Visit (INDEPENDENT_AMBULATORY_CARE_PROVIDER_SITE_OTHER): Payer: BC Managed Care – PPO

## 2022-09-04 DIAGNOSIS — I495 Sick sinus syndrome: Secondary | ICD-10-CM | POA: Diagnosis not present

## 2022-09-05 LAB — CUP PACEART REMOTE DEVICE CHECK
Battery Remaining Longevity: 126 mo
Battery Remaining Percentage: 100 %
Brady Statistic RA Percent Paced: 96 %
Brady Statistic RV Percent Paced: 100 %
Date Time Interrogation Session: 20240105103300
Implantable Lead Connection Status: 753985
Implantable Lead Connection Status: 753985
Implantable Lead Implant Date: 20041025
Implantable Lead Implant Date: 20041025
Implantable Lead Location: 753859
Implantable Lead Location: 753860
Implantable Lead Model: 4457
Implantable Lead Model: 4480
Implantable Lead Serial Number: 338209
Implantable Lead Serial Number: 424134
Implantable Pulse Generator Implant Date: 20211206
Lead Channel Impedance Value: 445 Ohm
Lead Channel Impedance Value: 493 Ohm
Lead Channel Setting Pacing Amplitude: 2.5 V
Lead Channel Setting Pacing Amplitude: 2.5 V
Lead Channel Setting Pacing Pulse Width: 0.4 ms
Lead Channel Setting Sensing Sensitivity: 3 mV
Pulse Gen Serial Number: 955727
Zone Setting Status: 755011

## 2022-09-24 NOTE — Progress Notes (Signed)
Remote pacemaker transmission.   

## 2022-10-09 ENCOUNTER — Encounter (HOSPITAL_COMMUNITY): Payer: Self-pay | Admitting: *Deleted

## 2022-10-13 ENCOUNTER — Other Ambulatory Visit: Payer: Self-pay | Admitting: Cardiovascular Disease

## 2022-10-13 DIAGNOSIS — I4819 Other persistent atrial fibrillation: Secondary | ICD-10-CM

## 2022-12-05 ENCOUNTER — Ambulatory Visit (INDEPENDENT_AMBULATORY_CARE_PROVIDER_SITE_OTHER): Payer: BC Managed Care – PPO

## 2022-12-05 DIAGNOSIS — I495 Sick sinus syndrome: Secondary | ICD-10-CM | POA: Diagnosis not present

## 2022-12-10 LAB — CUP PACEART REMOTE DEVICE CHECK
Battery Remaining Longevity: 126 mo
Battery Remaining Percentage: 100 %
Brady Statistic RA Percent Paced: 96 %
Brady Statistic RV Percent Paced: 100 %
Date Time Interrogation Session: 20240409201500
Implantable Lead Connection Status: 753985
Implantable Lead Connection Status: 753985
Implantable Lead Implant Date: 20041025
Implantable Lead Implant Date: 20041025
Implantable Lead Location: 753859
Implantable Lead Location: 753860
Implantable Lead Model: 4457
Implantable Lead Model: 4480
Implantable Lead Serial Number: 338209
Implantable Lead Serial Number: 424134
Implantable Pulse Generator Implant Date: 20211206
Lead Channel Impedance Value: 470 Ohm
Lead Channel Impedance Value: 538 Ohm
Lead Channel Setting Pacing Amplitude: 2.5 V
Lead Channel Setting Pacing Amplitude: 2.5 V
Lead Channel Setting Pacing Pulse Width: 0.4 ms
Lead Channel Setting Sensing Sensitivity: 3 mV
Pulse Gen Serial Number: 955727
Zone Setting Status: 755011

## 2022-12-24 ENCOUNTER — Other Ambulatory Visit: Payer: Self-pay | Admitting: Cardiovascular Disease

## 2022-12-24 DIAGNOSIS — I4819 Other persistent atrial fibrillation: Secondary | ICD-10-CM

## 2022-12-24 DIAGNOSIS — Z01812 Encounter for preprocedural laboratory examination: Secondary | ICD-10-CM

## 2022-12-25 NOTE — Telephone Encounter (Signed)
Eliquis  refill request received. Patient is 77 years old, weight-101.4kg, Crea-1.08 on 08/11/22, Diagnosis-afib, and last seen by Dr. Royann Shivers on 07/28/22. Dose is appropriate based on dosing criteria. Will send in refill to requested pharmacy.

## 2023-01-06 NOTE — Progress Notes (Signed)
Remote pacemaker transmission.   

## 2023-01-19 ENCOUNTER — Ambulatory Visit: Payer: BC Managed Care – PPO | Attending: Cardiovascular Disease | Admitting: Cardiovascular Disease

## 2023-01-19 ENCOUNTER — Ambulatory Visit (HOSPITAL_COMMUNITY): Payer: BC Managed Care – PPO | Attending: Cardiovascular Disease

## 2023-01-19 ENCOUNTER — Encounter: Payer: Self-pay | Admitting: Cardiovascular Disease

## 2023-01-19 ENCOUNTER — Ambulatory Visit
Admission: RE | Admit: 2023-01-19 | Discharge: 2023-01-19 | Disposition: A | Discharge status: Home / Self Care | Payer: BC Managed Care – PPO | Source: Ambulatory Visit | Attending: Cardiovascular Disease | Admitting: Cardiovascular Disease

## 2023-01-19 VITALS — BP 100/62 | HR 60 | Ht 71.0 in | Wt 221.2 lb

## 2023-01-19 DIAGNOSIS — R0602 Shortness of breath: Secondary | ICD-10-CM

## 2023-01-19 DIAGNOSIS — I442 Atrioventricular block, complete: Secondary | ICD-10-CM

## 2023-01-19 DIAGNOSIS — I4819 Other persistent atrial fibrillation: Secondary | ICD-10-CM

## 2023-01-19 DIAGNOSIS — G4733 Obstructive sleep apnea (adult) (pediatric): Secondary | ICD-10-CM

## 2023-01-19 DIAGNOSIS — I495 Sick sinus syndrome: Secondary | ICD-10-CM

## 2023-01-19 DIAGNOSIS — I5032 Chronic diastolic (congestive) heart failure: Secondary | ICD-10-CM

## 2023-01-19 DIAGNOSIS — Z5181 Encounter for therapeutic drug level monitoring: Secondary | ICD-10-CM | POA: Diagnosis not present

## 2023-01-19 DIAGNOSIS — I5033 Acute on chronic diastolic (congestive) heart failure: Secondary | ICD-10-CM | POA: Diagnosis not present

## 2023-01-19 DIAGNOSIS — Z950 Presence of cardiac pacemaker: Secondary | ICD-10-CM

## 2023-01-19 DIAGNOSIS — Z01812 Encounter for preprocedural laboratory examination: Secondary | ICD-10-CM

## 2023-01-19 DIAGNOSIS — Z79899 Other long term (current) drug therapy: Secondary | ICD-10-CM

## 2023-01-19 LAB — ECHOCARDIOGRAM COMPLETE
Area-P 1/2: 2.06 cm2
Height: 71 in
P 1/2 time: 831 msec
S' Lateral: 3.9 cm
Weight: 3539.2 oz

## 2023-01-19 MED ORDER — POTASSIUM CHLORIDE CRYS ER 20 MEQ PO TBCR: 20.0000 meq | EXTENDED_RELEASE_TABLET | Freq: Every day | ORAL | 3 refills | Status: DC

## 2023-01-19 MED ORDER — APIXABAN 5 MG PO TABS: 5.0000 mg | ORAL_TABLET | Freq: Two times a day (BID) | ORAL | 3 refills | Status: DC

## 2023-01-19 NOTE — Patient Instructions (Signed)
Medication Instructions:  No changes *If you need a refill on your cardiac medications before your next appointment, please call your pharmacy*   Lab Work: CBC, CMP, TSH, free T4, BNP- today If you have labs (blood work) drawn today and your tests are completely normal, you will receive your results only by: MyChart Message (if you have MyChart) OR A paper copy in the mail If you have any lab test that is abnormal or we need to change your treatment, we will call you to review the results.   Testing/Procedures: A chest x-ray takes a picture of the organs and structures inside the chest, including the heart, lungs, and blood vessels. This test can show several things, including, whether the heart is enlarges; whether fluid is building up in the lungs; and whether pacemaker / defibrillator leads are still in place.   Your physician has requested that you have an echocardiogram. Echocardiography is a painless test that uses sound waves to create images of your heart. It provides your doctor with information about the size and shape of your heart and how well your heart's chambers and valves are working. This procedure takes approximately one hour. There are no restrictions for this procedure. Please do NOT wear cologne, perfume, aftershave, or lotions (deodorant is allowed). Please arrive 15 minutes prior to your appointment time.    Follow-Up: At [facility], you and your health needs are our priority.  As part of our continuing mission to provide you with exceptional heart care, we have created designated Provider Care Teams.  These Care Teams include your primary Cardiologist (physician) and Advanced Practice Providers (APPs -  Physician Assistants and Nurse Practitioners) who all work together to provide you with the care you need, when you need it.  We recommend signing up for the patient portal called "MyChart".  Sign up information is provided on this After Visit Summary.   MyChart is used to connect with patients for Virtual Visits (Telemedicine).  Patients are able to view lab/test results, encounter notes, upcoming appointments, etc.  Non-urgent messages can be sent to your provider as well.   To learn more about what you can do with MyChart, go to [email or web address].    Your next appointment:    [facility] with Dr Tresa Endo  6 month f/u with Dr Royann Shivers- PHYS PACER [facility]

## 2023-01-19 NOTE — Progress Notes (Unsigned)
Patient ID: Shane [name], male   DOB: 1946-08-11, 77 y.o.   MRN: [id number]     Cardiology Office Note    Date:  01/21/2023   ID:  [name], [name] May 23, 1946, MRN [id number]  PCP:  [name], MD  Cardiologist:   [name], MD   Chief Complaint  Patient presents with   Shortness of Breath    History of Present Illness:  Shane [name] is a 77 y.o. male who presents for recurrent persistent atypical atrial flutter.    He has a history of tachycardia-bradycardia syndrome, chronic diastolic heart failure (last echo April 2021 shows mildly depressed LVEF 45-50%), morbid obesity, obstructive sleep apnea on CPAP.  He underwent radiofrequency ablation for atrial fibrillation in August 2018 with a recurrent atrial fibrillation requiring cardioversion in February 2021 and June 2021, then started on amiodarone and underwent successful cardioversion with lasting benefit on April 11, 2020.  He underwent pacemaker generator change out last December [facility] [employer], August 06, 2020).  He had recurrent atrial fibrillation in March 2022, symptomatic with difficult rate control.  Amiodarone was initiated.  He had repeat ablation performed in July 2022.  He again returned with atrial flutter, this time well rate controlled.  The amiodarone dose was increased to 200 mg twice daily in mid August.  Overdrive pacing was unsuccessful in the office, but was successful when he presented for cardioversion later the same month.  He has maintained normal rhythm since that time.  Amiodarone has subsequently been reduced to 200 mg once daily.  [name] is not doing as well as he was at his last appointment.  He has now developed shortness of breath usually with walking longer distances, but sometimes even with a lighter household activities.  Seems to have NYHA functional class II-3A.  He has not had lower extremity edema other than a little indentation from the elastic socks.  He denies orthopnea or  PND.  He has not had dizziness or syncope.  He has not had any palpitations and his pacemaker does not show any recurrence of atrial fibrillation.  Since he started amiodarone he has 100% ventricular pacing.  Continues to have easy bruising on the anticoagulant.  He has not had any chest pain or focal neurological complaints, falls, serious injuries or serious bleeding.  Continues to have some daytime hypersomnolence and is scheduled for a sleep clinic visit.  Probably needs to have a retitration of his CPAP settings.  Pacemaker interrogation shows normal device function.  Stimulator generator longevity is 10 years.  He has 94% atrial pacing and 100% ventricular pacing.  The heart rate histogram suggest appropriate settings on his rate response sensor.  He has not had any episodes of high ventricular rate or atrial fibrillation.  Estimated generator longevity is 10 years.  Lead parameters are good.  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   guidant  [facility] [phone])   Sinus node dysfunction (HCC)    Sleep apnea    CPAP   Wears hearing aid in both ears     Past Surgical History:  Procedure Laterality Date   ATRIAL FIBRILLATION ABLATION N/A 04/03/2017   Procedure: Atrial Fibrillation Ablation;  Surgeon: Regan Lemming, MD;  Location: [facility] INVASIVE CV LAB;  Service: Cardiovascular;  Laterality: N/A;   ATRIAL FIBRILLATION  ABLATION N/A 03/01/2021   Procedure: ATRIAL FIBRILLATION ABLATION;  Surgeon: Regan Lemming, MD;  Location: [facility] INVASIVE CV LAB;  Service: Cardiovascular;  Laterality: N/A;   CARDIAC CATHETERIZATION  10/13/1994   No CAD   CARDIAC CATHETERIZATION  05/10/2003   No CAD   CARDIOVERSION N/A 10/21/2016   Procedure: CARDIOVERSION;  Surgeon: [name], MD;  Location: [facility] ENDOSCOPY;  Service: Cardiovascular;   Laterality: N/A;   CARDIOVERSION N/A 05/15/2017   Procedure: CARDIOVERSION;  Surgeon: Pricilla Riffle, MD;  Location: [facility] ENDOSCOPY;  Service: Cardiovascular;  Laterality: N/A;   CARDIOVERSION N/A 10/21/2019   Procedure: CARDIOVERSION;  Surgeon: [name], MD;  Location: [facility] ENDOSCOPY;  Service: Cardiovascular;  Laterality: N/A;   CARDIOVERSION N/A 02/24/2020   Procedure: CARDIOVERSION;  Surgeon: Lewayne Bunting, MD;  Location: [facility] ENDOSCOPY;  Service: Cardiovascular;  Laterality: N/A;   CARDIOVERSION N/A 04/11/2020   Procedure: CARDIOVERSION;  Surgeon: Chilton Si, MD;  Location: [facility] ENDOSCOPY;  Service: Cardiovascular;  Laterality: N/A;   CARDIOVERSION N/A 12/26/2020   Procedure: CARDIOVERSION;  Surgeon: [name], MD;  Location: [facility] ENDOSCOPY;  Service: Cardiovascular;  Laterality: N/A;   CATARACT EXTRACTION W/PHACO Left 05/18/2019   Procedure: CATARACT EXTRACTION PHACO AND INTRAOCULAR LENS PLACEMENT (IOC) LEFT  1:21 11.0% 9.01;  Surgeon: Lockie Mola, MD;  Location: [facility] SURGERY CNTR;  Service: Ophthalmology;  Laterality: Left;  sleep apnea   CATARACT EXTRACTION W/PHACO Right 06/29/2019   Procedure: CATARACT EXTRACTION PHACO AND INTRAOCULAR LENS PLACEMENT (IOC) RIGHT  01:14.2  14.5%  10.92;  Surgeon: Lockie Mola, MD;  Location: [facility] SURGERY CNTR;  Service: Ophthalmology;  Laterality: Right;  sleep apnea   CYST EXCISION  03/2019   on back    NM MYOCAR PERF WALL MOTION  05/07/2011   Lexiscan: No ishcemia   PERMANENT PACEMAKER INSERTION  03/12/2010   guidant   PPM GENERATOR CHANGEOUT N/A 08/06/2020   Procedure: PPM GENERATOR CHANGEOUT;  Surgeon: [name], MD;  Location: [facility] INVASIVE CV LAB;  Service: Cardiovascular;  Laterality: N/A;   ROTATOR CUFF REPAIR  1996   SHOULDER ARTHROSCOPY     TEE WITHOUT CARDIOVERSION N/A 03/01/2021   Procedure: TRANSESOPHAGEAL ECHOCARDIOGRAM (TEE);  Surgeon: Regan Lemming, MD;  Location: [facility] INVASIVE CV LAB;  Service:  Cardiovascular;  Laterality: N/A;   US ECHOCARDIOGRAPHY  05/07/2011   mod. LVH,LA mod. dilated,borderline aortic root dilatation    Outpatient Medications Prior to Visit  Medication Sig Dispense Refill   allopurinol (ZYLOPRIM) 300 MG tablet Take 1 tablet (300 mg total) by mouth daily. 30 tablet 2   amiodarone (PACERONE) 200 MG tablet Take 1 tablet (200 mg total) by mouth in the morning and at bedtime. 180 tablet 3   Apoaequorin (PREVAGEN PO) Take 1 tablet by mouth daily.     Ascorbic Acid (VITAMIN C) 1000 MG tablet Take 1,000 mg by mouth daily.     Cholecalciferol (VITAMIN D-3 PO) Take 2,500 Units by mouth at bedtime.     citalopram (CELEXA) 40 MG tablet Take 1 tablet (40 mg total) by mouth daily. MUST RECEIVE FUTURE REFILLS FROM PCP. 30 tablet 0   erythromycin ophthalmic ointment Place 1 Application into both eyes as needed.     furosemide (LASIX) 40 MG tablet Take 1.5 tablets (60 mg total) by mouth daily. 45 tablet 6   montelukast (SINGULAIR) 10 MG tablet Take 10 mg by mouth at bedtime.     Multiple Vitamin (MULTIVITAMIN WITH MINERALS) TABS tablet Take 1 tablet by mouth daily. Senior  Omega-3 Fatty Acids (FISH OIL) 1200 MG CAPS Take 1,200 mg by mouth 2 (two) times daily.      pyridOXINE (VITAMIN B-6) 100 MG tablet Take 100 mg by mouth daily.     tamsulosin (FLOMAX) 0.4 MG CAPS capsule Take 1 capsule (0.4 mg total) by mouth daily. 30 capsule 11   Testosterone 1.62 % GEL Apply 3 Pump topically daily. Shoulders  1   vitamin B-12 (CYANOCOBALAMIN) 1000 MCG tablet Take 1,000 mcg by mouth daily.     zinc gluconate 50 MG tablet Take 50 mg by mouth daily.     apixaban (ELIQUIS) 5 MG TABS tablet Take 1 tablet (5 mg total) by mouth 2 (two) times daily. 180 tablet 1   potassium chloride SA (KLOR-CON M) 20 MEQ tablet Take 1 tablet (20 mEq total) by mouth daily. 90 tablet 3   colchicine 0.6 MG tablet Take 0.5 tablets (0.3 mg total) by mouth daily as needed. 30 tablet 2   No facility-administered  medications prior to visit.     Allergies:   Patient has no known allergies.   Social History   Socioeconomic History   Marital status: Married    Spouse name: Not on file   Number of children: Not on file   Years of education: Not on file   Highest education level: Not on file  Occupational History   Not on file  Tobacco Use   Smoking status: Former    Types: Cigars    Quit date: 05/11/1993    Years since quitting: 29.7   Smokeless tobacco: Former    Types: Chew    Quit date: 05/11/1993   Tobacco comments:    Former smoker 04/29/21  Vaping Use   Vaping Use: Never used  Substance and Sexual Activity   Alcohol use: Not Currently    Comment: social   Drug use: No   Sexual activity: Not on file  Other Topics Concern   Not on file  Social History Narrative   Not on file   Social Determinants of Health   Financial Resource Strain: Not on file  Food Insecurity: Not on file  Transportation Needs: Not on file  Physical Activity: Not on file  Stress: Not on file  Social Connections: Not on file     Family History:  The patient's family history includes Alzheimer's disease in his father; Dementia in his sister; Heart disease in his sister; Heart failure in his mother; Stroke in his mother.   ROS:   Please see the history of present illness.   All other systems are reviewed and are negative.   PHYSICAL EXAM:   VS:  BP 100/62 (BP Location: Left Arm, Patient Position: Sitting, Cuff Size: Large)   Pulse 60   Ht 5\' 11"  (1.803 m)   Wt 221 lb 3.2 oz (100.3 kg)   SpO2 97%   BMI 30.85 kg/m      General: Alert, oriented x3, no distress, mildly obese, healthy pacemaker site Head: no evidence of trauma, PERRL, EOMI, no exophtalmos or lid lag, no myxedema, no xanthelasma; normal ears, nose and oropharynx Neck: normal jugular venous pulsations and no hepatojugular reflux; brisk carotid pulses without delay and no carotid bruits Chest: clear to auscultation, no signs of  consolidation by percussion or palpation, normal fremitus, symmetrical and full respiratory excursions Cardiovascular: normal position and quality of the apical impulse, regular rhythm, normal first and paradoxically split second heart sounds, no murmurs, rubs or gallops Abdomen: no tenderness or distention,  no masses by palpation, no abnormal pulsatility or arterial bruits, normal bowel sounds, no hepatosplenomegaly Extremities: no clubbing, cyanosis or edema; 2+ radial, ulnar and brachial pulses bilaterally; 2+ right femoral, posterior tibial and dorsalis pedis pulses; 2+ left femoral, posterior tibial and dorsalis pedis pulses; no subclavian or femoral bruits Neurological: grossly nonfocal Psych: Normal mood and affect    Wt Readings from Last 3 Encounters:  01/20/23 225 lb 9.6 oz (102.3 kg)  01/19/23 221 lb 3.2 oz (100.3 kg)  07/28/22 223 lb 9.6 oz (101.4 kg)      Studies/Labs Reviewed:   EKG: ECG performed today shows AV sequentially paced rhythm.  QRS is quite broad at 226 ms.   ASSESSMENT:    1. Acute on chronic diastolic (congestive) heart failure (HCC)   2. Persistent atrial fibrillation (HCC)   3. Shortness of breath   4. Encounter for monitoring amiodarone therapy   5. Pre-procedure lab exam   6. SSS (sick sinus syndrome) (HCC)   7. CHB (complete heart block) (HCC)   8. Presence of cardiac pacemaker   9. OSA (obstructive sleep apnea)      PLAN:  In order of problems listed above:  CHF: He continues to have worsening shortness of breath although we have increased his dose of loop diuretic.  Need to reevaluate left ventricular systolic function by echo (most recently mildly depressed LV function echo from April 2021 showed a slight reduction in LVEF to 45-50%).   His blood pressure would really not allow ACE inhibitor/ARB/ARNI, spironolactone or beta-blockers.could use SGLT2 inhibitors.   AFib s/p RFA 2018/redo RFA August 2022/atypical atrial flutter: Interrogation of  his device shows that he has not had any atrial fibrillation since his last atrial overdrive pacing procedure.  History of atypical atrial flutter, likely with a left atrial circuit, which has had both successful and unsuccessful attempts at overdrive pacing in the past.  Has not had any sustained arrhythmia since November 2022.  Compliant with anticoagulation.  CHA2DS2-VASc 4 (age 75, CHF, hypertension).   No history of embolic events.  Since he has not had any recent arrhythmia, cannot implicate this and his recent worsening exertional tolerance. SSS: Almost entirely atrial paced, heart rate histograms show good rate response settings. 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  He has quite a broad QRS complex on the ECG.  If we discover that he has worsening LVEF he could benefit from cardiac resynchronization therapy. Amiodarone: Reviewed potential toxic effects of amiodarone, and the need to recheck thyroid function and liver function test at least every 6 months, yearly eye exams, avoiding prolonged sun exposure due to his increased photosensitivity.  Also pointed out that amiodarone has multiple drug interactions that should be reviewed whenever he gets any new prescriptions. Hypotension: His blood pressure is borderline at 100/62.  The only medication he takes that could have impact on blood pressure other than the furosemide is tamsulosin.  He does not have symptoms of orthostatic hypotension. Obesity: Congratulated him on weight loss and I think this is contributing to his successful maintenance of normal rhythm. PM: normal device function continue remote downloads every 3 months. OSA: Face-to-face encounter for CPAP therapy today.  I think he needs retitration of his CPAP settings which has not been performed in many many years.  Will try to set this up as a home procedure.  His durable medical equipment provider ([employer]) no  longer provide support  for CPAP.  He needs a new provider.  When he does not use CPAP he has daytime hypersomnolence.  He reports 100% compliance in the past.  Encouraged him to continue using CPAP.Marland Kitchen     [place] 01/21/2023: Reviewed results of labs and echo.  Echo shows surprising left ventricular hypertrophy in a patient that has not had severe hypertension.  Will need to evaluate him for cardiac amyloidosis and we will schedule for a technetium 51m PYP scan.  Echo Doppler findings as well as elevated proBNP levels confirm the clinical impression that he is hypervolemic.  He needs additional diuresis but instead of increasing his furosemide I think he will do better if we add an SGLT2 inhibitor.  Sent in a prescription for Jardiance 10 mg once daily.  Medication Adjustments/Labs and Tests Ordered: Current medicines are reviewed at length with the patient today.  Concerns regarding medicines are outlined above.  Medication changes, Labs and Tests ordered today are listed in the Patient Instructions below. Patient Instructions  Medication Instructions:  No changes *If you need a refill on your cardiac medications before your next appointment, please call your pharmacy*   Lab Work: CBC, CMP, TSH, free T4, BNP- today If you have labs (blood work) drawn today and your tests are completely normal, you will receive your results only by: MyChart Message (if you have MyChart) OR A paper copy in the mail If you have any lab test that is abnormal or we need to change your treatment, we will call you to review the results.   Testing/Procedures: A chest x-ray takes a picture of the organs and structures inside the chest, including the heart, lungs, and blood vessels. This test can show several things, including, whether the heart is enlarges; whether fluid is building up in the lungs; and whether pacemaker / defibrillator leads are still in place.   Your physician has requested that you have an  echocardiogram. Echocardiography is a painless test that uses sound waves to create images of your heart. It provides your doctor with information about the size and shape of your heart and how well your heart's chambers and valves are working. This procedure takes approximately one hour. There are no restrictions for this procedure. Please do NOT wear cologne, perfume, aftershave, or lotions (deodorant is allowed). Please arrive 15 minutes prior to your appointment time.    Follow-Up: At [facility], you and your health needs are our priority.  As part of our continuing mission to provide you with exceptional heart care, we have created designated Provider Care Teams.  These Care Teams include your primary Cardiologist (physician) and Advanced Practice Providers (APPs -  Physician Assistants and Nurse Practitioners) who all work together to provide you with the care you need, when you need it.  We recommend signing up for the patient portal called "MyChart".  Sign up information is provided on this After Visit Summary.  MyChart is used to connect with patients for Virtual Visits (Telemedicine).  Patients are able to view lab/test results, encounter notes, upcoming appointments, etc.  Non-urgent messages can be sent to your provider as well.   To learn more about what you can do with MyChart, go to [email or web address].    Your next appointment:    First Available Sleep Clinic with Dr Tresa Endo  6 month f/u with Dr Royann Shivers- PHYS PACER CK      Signed, [name], MD  01/21/2023 8:55 AM    [facility] [address] [place], [place]  [address] Phone: [phone]; Fax: [phone]

## 2023-01-20 ENCOUNTER — Ambulatory Visit: Payer: BC Managed Care – PPO | Attending: Cardiovascular Disease | Admitting: Cardiovascular Disease

## 2023-01-20 ENCOUNTER — Encounter: Payer: Self-pay | Admitting: Cardiovascular Disease

## 2023-01-20 VITALS — BP 102/62 | HR 60 | Ht 71.0 in | Wt 225.6 lb

## 2023-01-20 DIAGNOSIS — I4819 Other persistent atrial fibrillation: Secondary | ICD-10-CM | POA: Diagnosis not present

## 2023-01-20 DIAGNOSIS — D6869 Other thrombophilia: Secondary | ICD-10-CM

## 2023-01-20 DIAGNOSIS — I10 Essential (primary) hypertension: Secondary | ICD-10-CM | POA: Diagnosis not present

## 2023-01-20 DIAGNOSIS — I495 Sick sinus syndrome: Secondary | ICD-10-CM

## 2023-01-20 DIAGNOSIS — G4733 Obstructive sleep apnea (adult) (pediatric): Secondary | ICD-10-CM | POA: Diagnosis not present

## 2023-01-20 NOTE — Progress Notes (Signed)
Cardiology Office Note    Date:  01/27/2023   [name], Shane [name] 08/15/1946, MRN [id number]  PCP:  Shane Shaggy, MD  Cardiologist:  Shane Guadalajara, MD (sleep); Shane [name]  New sleep consultation and evaluation   History of Present Illness:  Shane [name] is a 77 y.o. male who is followed by Dr. Royann [name] for cardiology care.  He is a former patient of Dr. Clarene [name] and has a history of sinus node dysfunction and atrial fibrillation for which he underwent dual-chamber permanent pacemaker implantation in 2011.  Remotely, he he was referred for a sleep study in 2009 by Dr. Clarene [name] and was not found to have significant sleep apnea with an AHI of 3.8/h.  However, in 2015, due to increasing symptomatology he underwent a sleep evaluation by Dr. Shelle [name] in September 2015 and was found to have severe sleep apnea with an AHI of 45/h.  He was ultimately titrated to 13 cm of water.  After Dr. Shelle [name] left [place], apparently I had seen him once on September 11, 2014.  At that time he was meeting compliance standards with AHI at 2.7 at 13 cm pressure.  He felt improved since initiating CPAP therapy and had more energy.  I have not seen him since that initial encounter in January 2016.  He is followed closely by Dr. Royann [name].  He apparently was seen by Dr. Royann [name] yesterday and had developed some shortness of breath with walking longer distances.  His pacemaker was functioning well and did not show any recurrence of atrial fibrillation.  Since he started amiodarone he was 100% ventricular pacing.  He continues to have easy bruisability on anticoagulation therapy.  During his evaluation it was noted that he was having daytime hypersomnolence.  [place] referred for sleep consultation and reassessment.  Apparently, Shane [name] received a new ResMed air sense 10 AutoSet unit on October 25, 2019.  He never saw a sleep physician following receiving his new machine.  [employer] [facility] was his DME company and  he is in need for a new company since [employer] is no longer in the CPAP business.  Based on his insurance he will need to be sent to either [employer], [facility], [facility], or [facility].  He typically goes to bed between 10:30 and 11:30.  He often wakes up with a very dry mouth.  He is up at 7 AM.  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  A download was obtained from April 22 through Jan 20, 2023.  This reveals excellent use with average use at 8 hours and 2 minutes/day.  His CPAP is set at 13 cm water pressure.  AHI is 0.7.  There were only several days over the prior month of mild mask leak.  An Epworth Sleepiness Scale score was calculated in the office today and this endorsed at 4 arguing against residual daytime sleepiness.  However, he states that he often can fall asleep while sitting down reading or while watching television.  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   [facility]  [facility] [phone])   Sinus node dysfunction (HCC)    Sleep apnea    CPAP   Wears hearing aid in both ears     Past Surgical History:  Procedure Laterality Date  ATRIAL FIBRILLATION ABLATION N/A 04/03/2017   Procedure: Atrial Fibrillation Ablation;  Surgeon: Regan Lemming, MD;  Location: [facility] INVASIVE CV LAB;  Service: Cardiovascular;  Laterality: N/A;   ATRIAL FIBRILLATION ABLATION N/A 03/01/2021   Procedure: ATRIAL FIBRILLATION ABLATION;  Surgeon: Regan Lemming, MD;  Location: [facility] INVASIVE CV LAB;  Service: Cardiovascular;  Laterality: N/A;   CARDIAC CATHETERIZATION  10/13/1994   No CAD   CARDIAC CATHETERIZATION  05/10/2003   No CAD   CARDIOVERSION N/A 10/21/2016   Procedure: CARDIOVERSION;  Surgeon: [name], MD;  Location: [facility] ENDOSCOPY;  Service: Cardiovascular;  Laterality: N/A;   CARDIOVERSION N/A 05/15/2017   Procedure:  CARDIOVERSION;  Surgeon: Pricilla Riffle, MD;  Location: [facility] ENDOSCOPY;  Service: Cardiovascular;  Laterality: N/A;   CARDIOVERSION N/A 10/21/2019   Procedure: CARDIOVERSION;  Surgeon: [name], MD;  Location: [facility] ENDOSCOPY;  Service: Cardiovascular;  Laterality: N/A;   CARDIOVERSION N/A 02/24/2020   Procedure: CARDIOVERSION;  Surgeon: Lewayne Bunting, MD;  Location: [facility] ENDOSCOPY;  Service: Cardiovascular;  Laterality: N/A;   CARDIOVERSION N/A 04/11/2020   Procedure: CARDIOVERSION;  Surgeon: Chilton Si, MD;  Location: [facility] ENDOSCOPY;  Service: Cardiovascular;  Laterality: N/A;   CARDIOVERSION N/A 12/26/2020   Procedure: CARDIOVERSION;  Surgeon: [name], MD;  Location: [facility] ENDOSCOPY;  Service: Cardiovascular;  Laterality: N/A;   CATARACT EXTRACTION W/PHACO Left 05/18/2019   Procedure: CATARACT EXTRACTION PHACO AND INTRAOCULAR LENS PLACEMENT (IOC) LEFT  1:21 11.0% 9.01;  Surgeon: Lockie Mola, MD;  Location: [facility] SURGERY CNTR;  Service: Ophthalmology;  Laterality: Left;  sleep apnea   CATARACT EXTRACTION W/PHACO Right 06/29/2019   Procedure: CATARACT EXTRACTION PHACO AND INTRAOCULAR LENS PLACEMENT (IOC) RIGHT  01:14.2  14.5%  10.92;  Surgeon: Lockie Mola, MD;  Location: [facility];  Service: Ophthalmology;  Laterality: Right;  sleep apnea   CYST EXCISION  03/2019   on back    NM MYOCAR PERF WALL MOTION  05/07/2011   Lexiscan: No ishcemia   PERMANENT PACEMAKER INSERTION  03/12/2010   [employer]   PPM GENERATOR CHANGEOUT N/A 08/06/2020   Procedure: PPM GENERATOR CHANGEOUT;  Surgeon: [name], MD;  Location: [facility] INVASIVE CV LAB;  Service: Cardiovascular;  Laterality: N/A;   ROTATOR CUFF REPAIR  1996   SHOULDER ARTHROSCOPY     TEE WITHOUT CARDIOVERSION N/A 03/01/2021   Procedure: TRANSESOPHAGEAL ECHOCARDIOGRAM (TEE);  Surgeon: Regan Lemming, MD;  Location: [facility] INVASIVE CV LAB;  Service: Cardiovascular;  Laterality: N/A;   US ECHOCARDIOGRAPHY  05/07/2011    mod. LVH,LA mod. dilated,borderline aortic root dilatation    Current Medications: Outpatient Medications Prior to Visit  Medication Sig Dispense Refill   allopurinol (ZYLOPRIM) 300 MG tablet Take 1 tablet (300 mg total) by mouth daily. 30 tablet 2   amiodarone (PACERONE) 200 MG tablet Take 1 tablet (200 mg total) by mouth in the morning and at bedtime. 180 tablet 3   apixaban (ELIQUIS) 5 MG TABS tablet Take 1 tablet (5 mg total) by mouth 2 (two) times daily. 180 tablet 3   Apoaequorin (PREVAGEN PO) Take 1 tablet by mouth daily.     Ascorbic Acid (VITAMIN C) 1000 MG tablet Take 1,000 mg by mouth daily.     Cholecalciferol (VITAMIN D-3 PO) Take 2,500 Units by mouth at bedtime.     citalopram (CELEXA) 40 MG tablet Take 1 tablet (40 mg total) by mouth daily. MUST RECEIVE FUTURE REFILLS FROM PCP. 30 tablet 0   colchicine 0.6 MG tablet Take 0.5 tablets (0.3 mg  total) by mouth daily as needed. 30 tablet 2   erythromycin ophthalmic ointment Place 1 Application into both eyes as needed.     furosemide (LASIX) 40 MG tablet Take 1.5 tablets (60 mg total) by mouth daily. 45 tablet 6   montelukast (SINGULAIR) 10 MG tablet Take 10 mg by mouth at bedtime.     Multiple Vitamin (MULTIVITAMIN WITH MINERALS) TABS tablet Take 1 tablet by mouth daily. Senior     Omega-3 Fatty Acids (FISH OIL) 1200 MG CAPS Take 1,200 mg by mouth 2 (two) times daily.      potassium chloride SA (KLOR-CON M) 20 MEQ tablet Take 1 tablet (20 mEq total) by mouth daily. 90 tablet 3   pyridOXINE (VITAMIN B-6) 100 MG tablet Take 100 mg by mouth daily.     tamsulosin (FLOMAX) 0.4 MG CAPS capsule Take 1 capsule (0.4 mg total) by mouth daily. 30 capsule 11   Testosterone 1.62 % GEL Apply 3 Pump topically daily. Shoulders  1   vitamin B-12 (CYANOCOBALAMIN) 1000 MCG tablet Take 1,000 mcg by mouth daily.     zinc gluconate 50 MG tablet Take 50 mg by mouth daily.     No facility-administered medications prior to visit.     Allergies:    Patient has no known allergies.   Social History   Socioeconomic History   Marital status: Married    Spouse name: Not on file   Number of children: Not on file   Years of education: Not on file   Highest education level: Not on file  Occupational History   Not on file  Tobacco Use   Smoking status: Former    Types: Cigars    Quit date: 05/11/1993    Years since quitting: 29.7   Smokeless tobacco: Former    Types: Chew    Quit date: 05/11/1993   Tobacco comments:    Former smoker 04/29/21  Vaping Use   Vaping Use: Never used  Substance and Sexual Activity   Alcohol use: Not Currently    Comment: social   Drug use: No   Sexual activity: Not on file  Other Topics Concern   Not on file  Social History Narrative   Not on file   Social Determinants of Health   Financial Resource Strain: Not on file  Food Insecurity: Not on file  Transportation Needs: Not on file  Physical Activity: Not on file  Stress: Not on file  Social Connections: Not on file    Socially he is married.  He is a retired Marine scientist.  Family History:  The patient's family history includes Alzheimer's disease in his father; Dementia in his sister; Heart disease in his sister; Heart failure in his mother; Stroke in his mother.   ROS General: Negative; No fevers, chills, or night sweats;  HEENT: Negative; No changes in vision or hearing, sinus congestion, difficulty swallowing Pulmonary: Negative; No cough, wheezing, shortness of breath, hemoptysis Cardiovascular: Negative; No chest pain, presyncope, syncope, palpitations GI: Negative; No nausea, vomiting, diarrhea, or abdominal pain GU: Negative; No dysuria, hematuria, or difficulty voiding Musculoskeletal: Negative; no myalgias, joint pain, or weakness Hematologic/Oncology: Negative; no easy bruising, bleeding Endocrine: Negative; no heat/cold intolerance; no diabetes Neuro: Negative; no changes in balance, headaches Skin: Negative; No rashes  or skin lesions Psychiatric: Negative; No behavioral problems, depression Sleep: Positive for OSA, with initial CPAP therapy started in September 2015.  He apparently received a new ResMed AirSense 10 AutoSet unit on October 25, 2019.  He is unaware of breakthrough snoring.  At times he admits to daytime sleepiness.  No bruxism, restless legs, hypnogognic hallucinations, no cataplexy  Other comprehensive 14 point system review is negative.   PHYSICAL EXAM:   VS:  BP 102/62   Pulse 60   Ht 5\' 11"  (1.803 m)   Wt 225 lb 9.6 oz (102.3 kg)   SpO2 97%   BMI 31.46 kg/m     Repeat blood pressure by me was 110/70  Wt Readings from Last 3 Encounters:  01/20/23 225 lb 9.6 oz (102.3 kg)  01/19/23 221 lb 3.2 oz (100.3 kg)  07/28/22 223 lb 9.6 oz (101.4 kg)    General: Alert, oriented, no distress.  Skin: normal turgor, no rashes, warm and dry HEENT: Normocephalic, atraumatic. Pupils equal round and reactive to light; sclera anicteric; extraocular muscles intact;  Nose without nasal septal hypertrophy Mouth/Parynx benign; Mallinpatti scale 3 Neck: No JVD, no carotid bruits; normal carotid upstroke Lungs: clear to ausculatation and percussion; no wheezing or rales Chest wall: without tenderness to palpitation Heart: PMI not displaced, RRR, s1 s2 normal, 1/6 systolic murmur, no diastolic murmur, no rubs, gallops, thrills, or heaves Abdomen: soft, nontender; no hepatosplenomehaly, BS+; abdominal aorta nontender and not dilated by palpation. Back: no CVA tenderness Pulses 2+ Musculoskeletal: full range of motion, normal strength, no joint deformities Extremities: no clubbing cyanosis or edema, Homan's sign negative  Neurologic: grossly nonfocal; Cranial nerves grossly wnl Psychologic: Normal mood and affect   Studies/Labs Reviewed:   Jan 20, 2023 ECG (independently read by me): AV paced at 60  Recent Labs:    Latest Ref Rng & Units 01/19/2023   11:14 AM 08/11/2022    2:51 PM 12/23/2021    10:44 AM  BMP  Glucose 70 - 99 mg/dL 87  161  86   BUN 8 - 27 mg/dL 16  18  9    Creatinine 0.76 - 1.27 mg/dL 0.96  0.45  4.09   BUN/Creat Ratio 10 - 24 13  17  10    Sodium 134 - 144 mmol/L 139  137  140   Potassium 3.5 - 5.2 mmol/L 4.5  4.0  4.9   Chloride 96 - 106 mmol/L 99  98  100   CO2 20 - 29 mmol/L 29  29  27    Calcium 8.6 - 10.2 mg/dL 9.3  9.6  9.3         Latest Ref Rng & Units 01/19/2023   11:14 AM 08/11/2022    2:51 PM 12/23/2021   10:44 AM  Hepatic Function  Total Protein 6.0 - 8.5 g/dL 7.2  7.3  6.9   Albumin 3.8 - 4.8 g/dL 4.2  4.3  3.9   AST 0 - 40 IU/L 16  19  25    ALT 0 - 44 IU/L 11  15  18    Alk Phosphatase 44 - 121 IU/L 112  99  101   Total Bilirubin 0.0 - 1.2 mg/dL 0.7  0.7  0.7        Latest Ref Rng & Units 01/19/2023   11:14 AM 06/18/2021   11:07 AM 02/21/2021    8:58 AM  CBC  WBC 3.4 - 10.8 x10E3/uL 6.4  7.9  5.0   Hemoglobin 13.0 - 17.7 g/dL 81.1  91.4  78.2   Hematocrit 37.5 - 51.0 % 38.8  42.0  43.7   Platelets 150 - 450 x10E3/uL 240  234  227    Lab Results  Component Value Date  MCV 92 01/19/2023   MCV 94 06/18/2021   MCV 96 02/21/2021   Lab Results  Component Value Date   TSH 4.650 (H) 01/19/2023   No results found for: "HGBA1C"   BNP No results found for: "BNP"  ProBNP    Component Value Date/Time   PROBNP 705 (H) 01/19/2023 1114     Lipid Panel  No results found for: "CHOL", "TRIG", "HDL", "CHOLHDL", "VLDL", "LDLCALC", "LDLDIRECT", "LABVLDL"   RADIOLOGY: DG Chest 2 View  Result Date: 01/22/2023 CLINICAL DATA:  Pleural effusion. EXAM: CHEST - 2 VIEW COMPARISON:  Chest radiograph dated 04/07/2017. FINDINGS: No focal consolidation, pleural effusion or pneumothorax. Stable cardiac silhouette. Left pectoral pacemaker device. Degenerative changes of the spine. No acute osseous pathology. IMPRESSION: No active cardiopulmonary disease. Electronically Signed   By: Elgie Collard M.D.   On: 01/22/2023 21:37   ECHOCARDIOGRAM  COMPLETE  Result Date: 01/19/2023    ECHOCARDIOGRAM REPORT   Patient Name:   Shane [name] Date of Exam: 01/19/2023 Medical Rec #:  [id number]       Height:       71.0 in Accession #:    [id number]      Weight:       221.2 lb Date of Birth:  1946-02-16       BSA:          2.201 m Patient Age:    76 years        BP:           100/62 mmHg Patient Gender: M               HR:           60 bpm. Exam Location:  [address] Procedure: 2D Echo, Cardiac Doppler, Color Doppler, Strain Analysis and 3D Echo Indications:    R06.02 SOB  History:        Patient has prior history of Echocardiogram examinations, most                 recent 12/19/2019. CHF, Arrythmias:Atrial Fibrillation;                 Signs/Symptoms:Shortness of Breath.  Sonographer:    Samule Ohm RDCS Referring Phys: [phone] MIHAI CROITORU IMPRESSIONS  1. Left ventricular ejection fraction, by estimation, is 50 to 55%. The left ventricle has low normal function. The left ventricle has no regional wall motion abnormalities. There is moderate concentric left ventricular hypertrophy. Left ventricular diastolic parameters are consistent with Grade II diastolic dysfunction (pseudonormalization). Elevated left ventricular end-diastolic pressure. The average left ventricular global longitudinal strain is 9.6 %. The global longitudinal strain is abnormal.  2. Right ventricular systolic function is normal. The right ventricular size is normal.  3. Left atrial size was severely dilated.  4. The mitral valve is normal in structure. Trivial mitral valve regurgitation. No evidence of mitral stenosis.  5. The aortic valve is tricuspid. Aortic valve regurgitation is not visualized. No aortic stenosis is present.  6. Aortic dilatation noted. There is mild dilatation of the aortic root, measuring 42 mm. FINDINGS  Left Ventricle: Left ventricular ejection fraction, by estimation, is 50 to 55%. The left ventricle has low normal function. The left ventricle has no regional wall  motion abnormalities. The average left ventricular global longitudinal strain is 9.6 %. The global longitudinal strain is abnormal. The left ventricular internal cavity size was normal in size. There is moderate concentric left ventricular hypertrophy. Left ventricular diastolic parameters are consistent with  Grade II diastolic dysfunction (pseudonormalization). Elevated left ventricular end-diastolic pressure. Right Ventricle: The right ventricular size is normal. No increase in right ventricular wall thickness. Right ventricular systolic function is normal. Left Atrium: Left atrial size was severely dilated. Right Atrium: Right atrial size was normal in size. Pericardium: There is no evidence of pericardial effusion. Mitral Valve: The mitral valve is normal in structure. Trivial mitral valve regurgitation. No evidence of mitral valve stenosis. Tricuspid Valve: The tricuspid valve is normal in structure. Tricuspid valve regurgitation is trivial. No evidence of tricuspid stenosis. Aortic Valve: The aortic valve is tricuspid. Aortic valve regurgitation is not visualized. Aortic regurgitation PHT measures 831 msec. No aortic stenosis is present. Pulmonic Valve: The pulmonic valve was normal in structure. Pulmonic valve regurgitation is not visualized. No evidence of pulmonic stenosis. Aorta: Aortic dilatation noted. There is mild dilatation of the aortic root, measuring 42 mm. Venous: The inferior vena cava was not well visualized. IAS/Shunts: No atrial level shunt detected by color flow Doppler.  LEFT VENTRICLE PLAX 2D LVIDd:         5.50 cm   Diastology LVIDs:         3.90 cm   LV e' medial:    4.46 cm/s LV PW:         1.40 cm   LV E/e' medial:  15.0 LV IVS:        1.70 cm   LV e' lateral:   9.36 cm/s LVOT diam:     2.50 cm   LV E/e' lateral: 7.2 LV SV:         94 LV SV Index:   43        2D Longitudinal Strain LVOT Area:     4.91 cm  2D Strain GLS (A2C):   7.5 %                          2D Strain GLS (A3C):   8.3  %                          2D Strain GLS (A4C):   12.8 %                          2D Strain GLS Avg:     9.6 %                           3D Volume EF:                          3D EF:        54 %                          LV EDV:       249 ml                          LV ESV:       114 ml                          LV SV:        134 ml RIGHT VENTRICLE RV S prime:     11.00 cm/s TAPSE (M-mode): 1.5 cm RVSP:  37.8 mmHg LEFT ATRIUM              Index        RIGHT ATRIUM           Index LA diam:        5.60 cm  2.54 cm/m   RA Pressure: 3.00 mmHg LA Vol (A2C):   106.0 ml 48.16 ml/m  RA Area:     16.60 cm LA Vol (A4C):   132.5 ml 60.21 ml/m  RA Volume:   45.00 ml  20.45 ml/m LA Biplane Vol: 127.0 ml 57.71 ml/m  AORTIC VALVE LVOT Vmax:   92.60 cm/s LVOT Vmean:  64.100 cm/s LVOT VTI:    0.192 m AI PHT:      831 msec  AORTA Ao Root diam: 4.20 cm Ao Asc diam:  3.80 cm MITRAL VALVE               TRICUSPID VALVE MV Area (PHT): 2.06 cm    TR Peak grad:   34.8 mmHg MV Decel Time: 368 msec    TR Vmax:        295.00 cm/s MV E velocity: 67.00 cm/s  Estimated RAP:  3.00 mmHg MV A velocity: 32.20 cm/s  RVSP:           37.8 mmHg MV E/A ratio:  2.08                            SHUNTS                            Systemic VTI:  0.19 m                            Systemic Diam: 2.50 cm Chilton Si MD Electronically signed by Chilton Si MD Signature Date/Time: 01/19/2023/6:23:28 PM    Final      Additional studies/ records that were reviewed today include:   Records of Dr. Royann [name] were reviewed.  A download was obtained in the office today from April 22 through Jan 20, 2023.   ASSESSMENT:    1. Essential hypertension   2. OSA on CPAP   3. Persistent atrial fibrillation (HCC)   4. SSS (sick sinus syndrome) (HCC)   5. Tachycardia-bradycardia syndrome (HCC)   6. 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  Most recently, he has been 100% paced on his current dose of amiodarone.  He was found to have severe obstructive sleep apnea in 2015 and has been on CPAP therapy since that time.  After Dr. Shelle [name] left [place], I had seen him in January 2016 following his CPAP initiation and was compliant with therapy with his CPAP set at a 13 cm pressure.  Apparently, he was able to receive a new ResMed AirSense 10 AutoSet unit on October 25, 2019 with [facility] as his DME company.  This was during the pandemic and apparently he never had a subsequent sleep evaluation.  Recently, he has noticed some more fatigue.  He was seen yesterday by Dr. Royann [name] and there was concern that his pressures may need adjustment.  Patient admits that he has experienced some increased  shortness of breath with activity.  An Epworth scale was calculated in the office today morning noted a moderate chance of dozing while sitting and reading and while watching television.  I was able to obtain a download of his CPAP unit today.  At his 13 cm set pressure, AHI is excellent at 0.7.  He has used it 100% of the nights although the last 2 nights for some reason did not record but the patient states he had used therapy at home these 2 nights.  I had a long discussion with him today in the office.  I reviewed with him the importance of continued use of CPAP therapy particularly with his cardiovascular comorbidities.  I discussed optimal sleep duration at 7 and 9 hours and his most recent download shows average sleep with CPAP at 8 hours and 2 minutes.  I discussed potential untreated sleep apnea contributing to difficulty with blood pressure control and resistant hypertension, effects on nocturnal arrhythmias, increased risk for atrial fibrillation, as well as negative effects on insulin resistance, increased inflammatory  markers, as well as nocturnal GERD.  In addition I discussed potential nocturnal hypoxemia contributing to nocturnal dyspnea particularly if underlying atherosclerosis is present.  He will need a new DME company since [employer] is no longer in the business.  While he was here today, the [employer] representative was here and we have arranged set up with [employer] to be his new DME company.  He also has noticed some dry mouth, and I changed his humidification setting to a manual setting of 5 rather than his previous auto set at 4.  I answered all his questions.  He was very grateful of the time I spoke with him today.  His blood pressure today was stable and on repeat by me 120/70.  He continues to be on amiodarone and is anticoagulated on Eliquis 5 mg twice a day.  He takes furosemide 60 mg daily and is without edema.  He will follow-up with Dr. Royann [name].  As long as he is stable, I will see him in 1 year for reevaluation or sooner as needed.   Medication Adjustments/Labs and Tests Ordered: Current medicines are reviewed at length with the patient today.  Concerns regarding medicines are outlined above.  Medication changes, Labs and Tests ordered today are listed in the Patient Instructions below. Patient Instructions  Medication Instructions:  Your physician recommends that you continue on your current medications as directed. Please refer to the Current Medication list given to you today.  *If you need a refill on your cardiac medications before your next appointment, please call your pharmacy*   Lab Work: None   Testing/Procedures: None   Follow-Up: At [facility], you and your health needs are our priority.  As part of our continuing mission to provide you with exceptional heart care, we have created designated Provider Care Teams.  These Care Teams include your primary Cardiologist (physician) and Advanced Practice Providers (APPs -  Physician Assistants and Nurse Practitioners)  who all work together to provide you with the care you need, when you need it.   Your next appointment:    In May  Provider:   Nicki Guadalajara, MD     Signed, Shane Guadalajara, MD, [facility], ABSM Diplomate, American Board of Sleep Medicine  01/27/2023 3:34 PM    [facility] [address], [address], [place], [place]  [address] Phone: [phone]

## 2023-01-20 NOTE — Patient Instructions (Signed)
Medication Instructions:  Your physician recommends that you continue on your current medications as directed. Please refer to the Current Medication list given to you today.  *If you need a refill on your cardiac medications before your next appointment, please call your pharmacy*   Lab Work: None   Testing/Procedures: None   Follow-Up: At [facility], you and your health needs are our priority.  As part of our continuing mission to provide you with exceptional heart care, we have created designated Provider Care Teams.  These Care Teams include your primary Cardiologist (physician) and Advanced Practice Providers (APPs -  Physician Assistants and Nurse Practitioners) who all work together to provide you with the care you need, when you need it.   Your next appointment:    In May  Provider:   Nicki Guadalajara, MD

## 2023-01-21 ENCOUNTER — Telehealth: Payer: Self-pay | Admitting: Emergency Medicine

## 2023-01-21 ENCOUNTER — Encounter: Payer: Self-pay | Admitting: Cardiovascular Disease

## 2023-01-21 DIAGNOSIS — I517 Cardiomegaly: Secondary | ICD-10-CM

## 2023-01-21 LAB — COMPREHENSIVE METABOLIC PANEL
ALT: 11 IU/L (ref 0–44)
AST: 16 IU/L (ref 0–40)
Albumin/Globulin Ratio: 1.4 (ref 1.2–2.2)
Albumin: 4.2 g/dL (ref 3.8–4.8)
Alkaline Phosphatase: 112 IU/L (ref 44–121)
BUN/Creatinine Ratio: 13 (ref 10–24)
BUN: 16 mg/dL (ref 8–27)
Bilirubin Total: 0.7 mg/dL (ref 0.0–1.2)
CO2: 29 mmol/L (ref 20–29)
Calcium: 9.3 mg/dL (ref 8.6–10.2)
Chloride: 99 mmol/L (ref 96–106)
Creatinine, Ser: 1.19 mg/dL (ref 0.76–1.27)
Globulin, Total: 3 g/dL (ref 1.5–4.5)
Glucose: 87 mg/dL (ref 70–99)
Potassium: 4.5 mmol/L (ref 3.5–5.2)
Sodium: 139 mmol/L (ref 134–144)
Total Protein: 7.2 g/dL (ref 6.0–8.5)
eGFR: 63 mL/min/{1.73_m2} (ref >59)

## 2023-01-21 LAB — CBC
Hematocrit: 38.8 % (ref 37.5–51.0)
Hemoglobin: 12.5 g/dL — ABNORMAL LOW (ref 13.0–17.7)
MCH: 29.7 pg (ref 26.6–33.0)
MCHC: 32.2 g/dL (ref 31.5–35.7)
MCV: 92 fL (ref 79–97)
Platelets: 240 10*3/uL (ref 150–450)
RBC: 4.21 x10E6/uL (ref 4.14–5.80)
RDW: 13.9 % (ref 11.6–15.4)
WBC: 6.4 10*3/uL (ref 3.4–10.8)

## 2023-01-21 LAB — TSH: TSH: 4.65 u[IU]/mL — ABNORMAL HIGH (ref 0.450–4.500)

## 2023-01-21 LAB — PRO B NATRIURETIC PEPTIDE: NT-Pro BNP: 705 pg/mL — ABNORMAL HIGH (ref 0–486)

## 2023-01-21 LAB — T4, FREE: Free T4: 1.24 ng/dL (ref 0.82–1.77)

## 2023-01-21 MED ORDER — EMPAGLIFLOZIN 10 MG PO TABS: 10.0000 mg | ORAL_TABLET | Freq: Every day | ORAL | 3 refills | Status: DC

## 2023-01-21 NOTE — Telephone Encounter (Signed)
Croitoru, Rachelle Hora, MD  Scheryl Marten, RN BNP level is also high, confirming the impression that he has too much fluid on board. I think he should start Jardiance 10 mg daily. This should help him feel better and may even allow [place] to decrease the dose of furosemide or stop it altogether. I talked to him and sent in the Rx. Please make him an APP appointment in the last week of June.  Appt made for 02/19/23; pt is going on a cruise on the las week of June, so needed the week before or after.

## 2023-01-21 NOTE — Addendum Note (Signed)
Addended by: Scheryl Marten on: 01/21/2023 05:01 PM   Modules accepted: Orders

## 2023-01-21 NOTE — Telephone Encounter (Signed)
Called to give the pt Dr Croitoru's message about results of Echo; left message and call back number.   The message from Dr: [name], MD  Scheryl Marten, RN Mostly good news on the echocardiogram: heart pumping strength is improved, now at the lower limit of normal normal.  Surprisingly, the left ventricular walls are little thick, which explains "grade 2 diastolic dysfunction".  This means that although the ventricle squeezes well, it has difficulty with filling which can cause shortness of breath.  There are no serious valvular abnormalities.   Thick left ventricular walls are usually a consequence of high blood pressure, but Shane [name] has really never had problems with high blood pressure.  Would like him to have a technetium 62m - PYP scan for detection of possible cardiac amyloidosis.  This is a rare disorder, but has a very specific treatment service worth checking.  I went ahead and ordered this test

## 2023-01-22 ENCOUNTER — Telehealth: Payer: Self-pay | Admitting: Cardiovascular Disease

## 2023-01-22 ENCOUNTER — Other Ambulatory Visit (HOSPITAL_COMMUNITY): Payer: BC Managed Care – PPO

## 2023-01-22 NOTE — Telephone Encounter (Signed)
Patient is returning RN's call regarding his echo results. He is requesting a callback from another nurse today to discuss due to her being out of office.   Please advise.

## 2023-01-22 NOTE — Telephone Encounter (Signed)
Patient called seeking clarification on who will be scheduling his Myocardial amyloid imaging. Advised pt someone for the scheduling pool will contact and schedule the appointment. Pt voiced understanding.

## 2023-01-27 ENCOUNTER — Encounter: Payer: Self-pay | Admitting: Cardiovascular Disease

## 2023-01-27 ENCOUNTER — Other Ambulatory Visit: Payer: Self-pay | Admitting: Cardiovascular Disease

## 2023-02-09 ENCOUNTER — Telehealth (HOSPITAL_COMMUNITY): Payer: Self-pay

## 2023-02-09 NOTE — Telephone Encounter (Signed)
Spoke with the patient, detailed instructions given. He stated that he would be here for his test. Asked to call back with any questions. S.Roya Gieselman EMTP/CCT 

## 2023-02-16 ENCOUNTER — Ambulatory Visit (HOSPITAL_COMMUNITY): Payer: BC Managed Care – PPO | Attending: Cardiology

## 2023-02-16 DIAGNOSIS — I517 Cardiomegaly: Secondary | ICD-10-CM

## 2023-02-16 MED ORDER — TECHNETIUM TC 99M PYROPHOSPHATE
21.0000 | Freq: Once | INTRAVENOUS | Status: AC
  Administered 2023-02-16: 21 via INTRAVENOUS

## 2023-02-17 LAB — MYOCARDIAL AMYLOID PLANAR & SPECT: H/CL Ratio: 1

## 2023-02-19 ENCOUNTER — Encounter: Payer: Self-pay | Admitting: Student

## 2023-02-19 ENCOUNTER — Ambulatory Visit: Payer: BC Managed Care – PPO | Attending: Student | Admitting: Student

## 2023-02-19 VITALS — BP 102/58 | HR 60 | Ht 71.0 in | Wt 223.2 lb

## 2023-02-19 DIAGNOSIS — I442 Atrioventricular block, complete: Secondary | ICD-10-CM

## 2023-02-19 DIAGNOSIS — G4733 Obstructive sleep apnea (adult) (pediatric): Secondary | ICD-10-CM

## 2023-02-19 DIAGNOSIS — I5032 Chronic diastolic (congestive) heart failure: Secondary | ICD-10-CM | POA: Diagnosis not present

## 2023-02-19 DIAGNOSIS — I480 Paroxysmal atrial fibrillation: Secondary | ICD-10-CM | POA: Diagnosis not present

## 2023-02-19 DIAGNOSIS — Z79899 Other long term (current) drug therapy: Secondary | ICD-10-CM

## 2023-02-19 DIAGNOSIS — Z950 Presence of cardiac pacemaker: Secondary | ICD-10-CM

## 2023-02-19 MED ORDER — FUROSEMIDE 20 MG PO TABS: 20.0000 mg | ORAL_TABLET | Freq: Every day | ORAL | 3 refills | Status: DC

## 2023-02-19 NOTE — Patient Instructions (Addendum)
Medication Instructions:  Furosemide (Lasix) take 60 mg daily.  *If you need a refill on your cardiac medications before your next appointment, please call your pharmacy*   Lab Work: BMET in 1-2 weeks   Testing/Procedures: NONE ordered at this time of appointment     Follow-Up: At [facility], you and your health needs are our priority.  As part of our continuing mission to provide you with exceptional heart care, we have created designated Provider Care Teams.  These Care Teams include your primary Cardiologist (physician) and Advanced Practice Providers (APPs -  Physician Assistants and Nurse Practitioners) who all work together to provide you with the care you need, when you need it.  We recommend signing up for the patient portal called "MyChart".  Sign up information is provided on this After Visit Summary.  MyChart is used to connect with patients for Virtual Visits (Telemedicine).  Patients are able to view lab/test results, encounter notes, upcoming appointments, etc.  Non-urgent messages can be sent to your provider as well.   To learn more about what you can do with MyChart, go to [email or web address].    Your next appointment:   3-4 month(s)  Provider:   [name], MD     Other Instructions

## 2023-02-19 NOTE — Progress Notes (Signed)
Cardiology Clinic Note   Date: 02/19/2023 ID: Shane, [name] Jun 12, 1946, MRN [id number]  Primary Cardiologist:  [name], MD  Patient Profile    Shane [name] is a 77 y.o. male who presents to the clinic today for follow up after cardiac testing.     Past medical history significant for: PAF/a-flutter. A-fib ablation 04/03/2017. DCCV 05/15/2017. DCCV 10/21/2019. DCCV 02/24/2020. DCCV 04/11/2020. DCCV 12/26/2020. A-fib ablation 03/01/2021. Chronic diastolic heart failure. Echo 01/19/2023: EF 50 to 55%.  Moderate concentric LVH.  Grade 2 DD.  Elevated LVEDP.  Global strain is abnormal.  Normal RV function.  Severe LAE.  Trivial MR.  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 PYP 02/16/2023: Normal study per Dr. Royann Shivers.  No evidence of amyloidosis. Complete heart block. PPM implantation 2015. GEN change out 08/06/2020. Remote device check 12/09/2022: Normal device function.  No clinically significant episodes of high ventricular rate or atrial mode switch noted.  Rare brief paroxysmal atrial tachycardia.  No atrial fibrillation seen.  Stable lead measurements.  Battery status good. Hyperlipidemia.  Lipid panel 07/28/2022: LDL 109, HDL 58, TG 37, total 175.  OSA. On CPAP.     History of Present Illness    Shane [name] is a longtime patient of cardiology.  He is followed by Dr. Royann Shivers and Dr. Elberta Fortis for the above outlined history.  Patient was last seen in the office by Dr. Royann Shivers on 01/19/2023 for complaints of shortness of breath.  He denied lower extremity edema but reported dyspnea with walking longer distances but sometimes occurring with lighter household activities.  Diuretic was increased.  Echo showed moderate LVH.  As patient does not have history of hypertension who was decided to proceed with PYP scan to evaluate for cardiac amyloidosis.  PYP was normal with no evidence of amyloidosis.  Today, patient reports continued dyspnea with light activities. No lower  extremity edema, abdominal bloating, orthopnea or PND. He weighs every couple of days and weight has been stable.  He is tolerating Jardiance.  Upon further discussion he reveals he has not increased furosemide as discussed at previous visit with Dr. Royann Shivers. Dr. Tresa Endo changed some of his CPAP settings but he has not noticed a big difference. He thinks he needs a new mask and was waiting on a call back. Sent message to sleep study coordinator to reach out to patient.     ROS: All other systems reviewed and are otherwise negative except as noted in History of Present Illness.  Studies Reviewed       ECG is not done today.   Risk Assessment/Calculations     CHA2DS2-VASc Score = 3   This indicates a 3.2% annual risk of stroke. The patient's score is based upon: CHF History: 1 HTN History: 0 Diabetes History: 0 Stroke History: 0 Vascular Disease History: 0 Age Score: 2 Gender Score: 0             Physical Exam    VS:  BP (!) 102/58 (BP Location: Left Arm, Patient Position: Sitting, Cuff Size: Normal)   Pulse 60   Ht 5\' 11"  (1.803 m)   Wt 223 lb 3.2 oz (101.2 kg)   SpO2 98%   BMI 31.13 kg/m  , BMI Body mass index is 31.13 kg/m.  GEN: Well nourished, well developed, in no acute distress. Neck: No JVD or carotid bruits. Cardiac:  RRR. No murmurs. No rubs or gallops.   Respiratory:  Respirations regular and unlabored. Clear to auscultation without rales,  wheezing or rhonchi. GI: Soft, nontender, nondistended. Extremities: Radials/DP/PT 2+ and equal bilaterally. No clubbing or cyanosis. No edema.  Skin: Warm and dry, no rash. Neuro: Strength intact.  Assessment & Plan    Chronic diastolic heart failure.  Echo May 2024 showed low normal LV function and normal RV function, moderate concentric LVH, Grade II DD, severe LAE.  PYP scan June 2024 was a normal study with no evidence of amyloidosis.  Patient denies lower extremity edema, abdominal bloating, orthopnea, or PND.  Did  not increase furosemide as instructed by Dr. Royann Shivers at last visit.  Euvolemic and well compensated on exam.  Patient reports he will not be able to split the 40 mg furosemide tablet in half as it is so small.  Will send in Lasix 20 mg daily.  He understands to take one 40 mg tablet and one 20 mg tablet daily. BMP in 1 week. Continue Jardiance. PAF/a-flutter.  A-fib ablation in 2018 followed by multiple DCCV and repeat ablation July 2022.  Patient denies palpitations. RRR on exam today.  Denies continuous bleeding concerns.  Continue amiodarone, Eliquis. Complete heart block.  S/p PPM implantation 2015 with GEN change out December 2021.  Pacer dependent.  Remote device check April 2024 showed normal device function, no A-fib, rare brief paroxysmal A. tach, stable lead measurements and battery status.  Continue to follow with remote device checks. OSA. Patient was seen by Dr. Tresa Endo and some adjustments to CPAP were made. He does not feel they are making a big difference. He needs a new mask. He is waiting to hear back about that. I sent a message to sleep coordinator to reach out to the patient.   Disposition: Increase Lasix to 60 mg daily (one 40 mg tablet, one 20 mg tablet). BMP 1 week. Return in 3-4 months or sooner as needed.          Signed, Etta Grandchild. Perri Aragones, DNP, NP-C

## 2023-02-24 ENCOUNTER — Ambulatory Visit: Payer: BC Managed Care – PPO | Admitting: Student

## 2023-03-02 ENCOUNTER — Other Ambulatory Visit: Payer: Self-pay

## 2023-03-06 ENCOUNTER — Ambulatory Visit (INDEPENDENT_AMBULATORY_CARE_PROVIDER_SITE_OTHER): Payer: BC Managed Care – PPO

## 2023-03-06 DIAGNOSIS — I442 Atrioventricular block, complete: Secondary | ICD-10-CM | POA: Diagnosis not present

## 2023-03-06 LAB — CUP PACEART REMOTE DEVICE CHECK
Battery Remaining Longevity: 120 mo
Battery Remaining Longevity: 120 mo
Battery Remaining Longevity: 120 mo
Battery Remaining Longevity: 120 mo
Battery Remaining Percentage: 100 %
Battery Remaining Percentage: 100 %
Battery Remaining Percentage: 100 %
Battery Remaining Percentage: 100 %
Brady Statistic RA Percent Paced: 97 %
Brady Statistic RA Percent Paced: 97 %
Brady Statistic RA Percent Paced: 97 %
Brady Statistic RA Percent Paced: 97 %
Brady Statistic RV Percent Paced: 100 %
Brady Statistic RV Percent Paced: 100 %
Brady Statistic RV Percent Paced: 100 %
Brady Statistic RV Percent Paced: 100 %
Date Time Interrogation Session: 20240705094300
Date Time Interrogation Session: 20240705095000
Date Time Interrogation Session: 20240705095300
Date Time Interrogation Session: 20240705095700
Implantable Lead Connection Status: 753985
Implantable Lead Connection Status: 753985
Implantable Lead Connection Status: 753985
Implantable Lead Connection Status: 753985
Implantable Lead Connection Status: 753985
Implantable Lead Connection Status: 753985
Implantable Lead Connection Status: 753985
Implantable Lead Connection Status: 753985
Implantable Lead Implant Date: 20041025
Implantable Lead Implant Date: 20041025
Implantable Lead Implant Date: 20041025
Implantable Lead Implant Date: 20041025
Implantable Lead Implant Date: 20041025
Implantable Lead Implant Date: 20041025
Implantable Lead Implant Date: 20041025
Implantable Lead Implant Date: 20041025
Implantable Lead Location: 753859
Implantable Lead Location: 753859
Implantable Lead Location: 753859
Implantable Lead Location: 753859
Implantable Lead Location: 753860
Implantable Lead Location: 753860
Implantable Lead Location: 753860
Implantable Lead Location: 753860
Implantable Lead Model: 4457
Implantable Lead Model: 4457
Implantable Lead Model: 4457
Implantable Lead Model: 4457
Implantable Lead Model: 4480
Implantable Lead Model: 4480
Implantable Lead Model: 4480
Implantable Lead Model: 4480
Implantable Lead Serial Number: 338209
Implantable Lead Serial Number: 338209
Implantable Lead Serial Number: 338209
Implantable Lead Serial Number: 338209
Implantable Lead Serial Number: 424134
Implantable Lead Serial Number: 424134
Implantable Lead Serial Number: 424134
Implantable Lead Serial Number: 424134
Implantable Pulse Generator Implant Date: 20211206
Implantable Pulse Generator Implant Date: 20211206
Implantable Pulse Generator Implant Date: 20211206
Implantable Pulse Generator Implant Date: 20211206
Lead Channel Impedance Value: 461 Ohm
Lead Channel Impedance Value: 461 Ohm
Lead Channel Impedance Value: 461 Ohm
Lead Channel Impedance Value: 461 Ohm
Lead Channel Impedance Value: 550 Ohm
Lead Channel Impedance Value: 550 Ohm
Lead Channel Impedance Value: 550 Ohm
Lead Channel Impedance Value: 550 Ohm
Lead Channel Setting Pacing Amplitude: 2.5 V
Lead Channel Setting Pacing Amplitude: 2.5 V
Lead Channel Setting Pacing Amplitude: 2.5 V
Lead Channel Setting Pacing Amplitude: 2.5 V
Lead Channel Setting Pacing Amplitude: 2.5 V
Lead Channel Setting Pacing Amplitude: 2.5 V
Lead Channel Setting Pacing Amplitude: 2.5 V
Lead Channel Setting Pacing Amplitude: 2.5 V
Lead Channel Setting Pacing Pulse Width: 0.4 ms
Lead Channel Setting Pacing Pulse Width: 0.4 ms
Lead Channel Setting Pacing Pulse Width: 0.4 ms
Lead Channel Setting Pacing Pulse Width: 0.4 ms
Lead Channel Setting Sensing Sensitivity: 3 mV
Lead Channel Setting Sensing Sensitivity: 3 mV
Lead Channel Setting Sensing Sensitivity: 3 mV
Lead Channel Setting Sensing Sensitivity: 3 mV
Pulse Gen Serial Number: 955727
Pulse Gen Serial Number: 955727
Pulse Gen Serial Number: 955727
Pulse Gen Serial Number: 955727
Zone Setting Status: 755011
Zone Setting Status: 755011
Zone Setting Status: 755011
Zone Setting Status: 755011

## 2023-03-07 LAB — BASIC METABOLIC PANEL
BUN/Creatinine Ratio: 14 (ref 10–24)
BUN: 15 mg/dL (ref 8–27)
CO2: 26 mmol/L (ref 20–29)
Calcium: 9.5 mg/dL (ref 8.6–10.2)
Chloride: 99 mmol/L (ref 96–106)
Creatinine, Ser: 1.1 mg/dL (ref 0.76–1.27)
Glucose: 83 mg/dL (ref 70–99)
Potassium: 4.5 mmol/L (ref 3.5–5.2)
Sodium: 139 mmol/L (ref 134–144)
eGFR: 69 mL/min/{1.73_m2} (ref >59)

## 2023-03-24 NOTE — Progress Notes (Signed)
Remote pacemaker transmission.   

## 2023-05-11 ENCOUNTER — Other Ambulatory Visit: Payer: Self-pay | Admitting: Cardiovascular Disease

## 2023-06-05 ENCOUNTER — Ambulatory Visit (INDEPENDENT_AMBULATORY_CARE_PROVIDER_SITE_OTHER): Payer: BC Managed Care – PPO

## 2023-06-05 DIAGNOSIS — I442 Atrioventricular block, complete: Secondary | ICD-10-CM | POA: Diagnosis not present

## 2023-06-05 LAB — CUP PACEART REMOTE DEVICE CHECK
Battery Remaining Longevity: 120 mo
Battery Remaining Percentage: 100 %
Brady Statistic RA Percent Paced: 97 %
Brady Statistic RV Percent Paced: 100 %
Date Time Interrogation Session: 20241004000100
Implantable Lead Connection Status: 753985
Implantable Lead Connection Status: 753985
Implantable Lead Implant Date: 20041025
Implantable Lead Implant Date: 20041025
Implantable Lead Location: 753859
Implantable Lead Location: 753860
Implantable Lead Model: 4457
Implantable Lead Model: 4480
Implantable Lead Serial Number: 338209
Implantable Lead Serial Number: 424134
Implantable Pulse Generator Implant Date: 20211206
Lead Channel Impedance Value: 439 Ohm
Lead Channel Impedance Value: 505 Ohm
Lead Channel Setting Pacing Amplitude: 2.5 V
Lead Channel Setting Pacing Amplitude: 2.5 V
Lead Channel Setting Pacing Pulse Width: 0.4 ms
Lead Channel Setting Sensing Sensitivity: 3 mV
Pulse Gen Serial Number: 955727
Zone Setting Status: 755011

## 2023-06-16 NOTE — Progress Notes (Signed)
Remote pacemaker transmission.   

## 2023-06-29 ENCOUNTER — Ambulatory Visit: Payer: BC Managed Care – PPO | Attending: Cardiovascular Disease | Admitting: Cardiovascular Disease

## 2023-06-29 ENCOUNTER — Other Ambulatory Visit: Payer: Self-pay

## 2023-06-29 VITALS — BP 102/58 | HR 61 | Ht 71.0 in | Wt 229.2 lb

## 2023-06-29 DIAGNOSIS — G4733 Obstructive sleep apnea (adult) (pediatric): Secondary | ICD-10-CM

## 2023-06-29 DIAGNOSIS — I5032 Chronic diastolic (congestive) heart failure: Secondary | ICD-10-CM

## 2023-06-29 DIAGNOSIS — Z79899 Other long term (current) drug therapy: Secondary | ICD-10-CM

## 2023-06-29 DIAGNOSIS — I4819 Other persistent atrial fibrillation: Secondary | ICD-10-CM | POA: Diagnosis not present

## 2023-06-29 MED ORDER — FUROSEMIDE 40 MG PO TABS: ORAL_TABLET | ORAL | 3 refills | Status: DC

## 2023-06-29 NOTE — Progress Notes (Signed)
Gave patient sample F30i mask and ordered new supplies for PAP therapy.

## 2023-06-29 NOTE — Progress Notes (Signed)
Patient ID: Shane [name], male   DOB: [birth year], 77 y.o.   MRN: [id number]     Cardiology Office Note    Date:  07/03/2023   [name], Shane [name] 1945-10-20, MRN [id number]  PCP:  Shane Shaggy, MD  Cardiologist:   [name], MD   Chief Complaint  Patient presents with   Congestive Heart Failure    History of Present Illness:  Shane [name] is a 77 y.o. male with sinus node dysfunction, high-grade AV block, recurrent persistent atrial flutter and atrial fibrillation, chronic heart failure (mildly depressed LVEF ), obstructive sleep apnea.  Shane [name] has been sleeping poorly recently and has been waking 4-5 times a night.  He has not been using his CPAP equipment due to damage to his mask.  He is trying to use an older mask.  He has also recently developed lower extremity edema as well as what sounds like bendopnea and possibly even mild orthopnea.  His weight is up about 6 pounds compared to June.  There has not been a change in his diet and he has been compliant with his medications including the furosemide.  He has not had chest pain.  He denies any falls, injuries or bleeding problems and continues to take apixaban.  He has not had any focal neurological events.  His most recent echocardiogram which was repeated 01/19/2023 actually showed improvement in LVEF which was now 50-55%.  He does have moderate LVH and grade 2 diastolic dysfunction as well as a very low global longitudinal strain of only 9.6%.  The left atrium is severely dilated.  There are no serious valvular abnormalities and the ascending aorta is minimally dilated at 42 mm.  He underwent a PYP scan for cardiac amyloidosis that was interpreted as being equivocal, but upon my review is frankly normal.  Quantitative heart-lung ratio was 1.0.  Pacemaker interrogation shows normal device function.  Battery longevity is estimated at 9 years.  All lead parameters are stable and in normal range.  Presenting rhythm is AV  sequential pacing.  He does not have any underlying ventricular escape rhythm and has retrograde P waves when paced VVI at 30 bpm.  He usually has 100% AV sequential pacing, but over the last 4-6 weeks there has been a steady decline in the need for atrial pacing, suggesting increased episodes of faster sinus rhythm or may be even premature atrial complexes.  He has not had atrial fibrillation, but he is recorded occasional paroxysmal atrial tachycardia lasting up to a minute in duration.  The heart rate histogram during pacing is quite blunted.  He has a history of tachycardia-bradycardia syndrome, chronic diastolic heart failure (last echo April 2021 shows mildly depressed LVEF 45-50%), morbid obesity, obstructive sleep apnea on CPAP.  He underwent radiofrequency ablation for atrial fibrillation in August 2018 with a recurrent atrial fibrillation requiring cardioversion in February 2021 and June 2021, then started on amiodarone and underwent successful cardioversion with lasting benefit on April 11, 2020.  He underwent pacemaker generator change out last December [facility] [employer], August 06, 2020).  He had recurrent atrial fibrillation in March 2022, symptomatic with difficult rate control.  Amiodarone was initiated.  He had repeat ablation performed in July 2022.  He again returned with atrial flutter, this time well rate controlled.  The amiodarone dose was increased to 200 mg twice daily in mid August.  Overdrive pacing was unsuccessful in the office, but was successful when he presented for cardioversion later  the same month.  He has maintained normal rhythm since that time.  Amiodarone has subsequently been reduced to 200 mg once daily.   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   guidant  [facility] [phone])   Sinus node  dysfunction (HCC)    Sleep apnea    CPAP   Wears hearing aid in both ears     Past Surgical History:  Procedure Laterality Date   ATRIAL FIBRILLATION ABLATION N/A 04/03/2017   Procedure: Atrial Fibrillation Ablation;  Surgeon: Regan Lemming, MD;  Location: [facility] INVASIVE CV LAB;  Service: Cardiovascular;  Laterality: N/A;   ATRIAL FIBRILLATION ABLATION N/A 03/01/2021   Procedure: ATRIAL FIBRILLATION ABLATION;  Surgeon: Regan Lemming, MD;  Location: [facility] INVASIVE CV LAB;  Service: Cardiovascular;  Laterality: N/A;   CARDIAC CATHETERIZATION  10/13/1994   No CAD   CARDIAC CATHETERIZATION  05/10/2003   No CAD   CARDIOVERSION N/A 10/21/2016   Procedure: CARDIOVERSION;  Surgeon: [name], MD;  Location: [facility] ENDOSCOPY;  Service: Cardiovascular;  Laterality: N/A;   CARDIOVERSION N/A 05/15/2017   Procedure: CARDIOVERSION;  Surgeon: Pricilla Riffle, MD;  Location: [facility] ENDOSCOPY;  Service: Cardiovascular;  Laterality: N/A;   CARDIOVERSION N/A 10/21/2019   Procedure: CARDIOVERSION;  Surgeon: [name], MD;  Location: [facility] ENDOSCOPY;  Service: Cardiovascular;  Laterality: N/A;   CARDIOVERSION N/A 02/24/2020   Procedure: CARDIOVERSION;  Surgeon: Lewayne Bunting, MD;  Location: [facility] ENDOSCOPY;  Service: Cardiovascular;  Laterality: N/A;   CARDIOVERSION N/A 04/11/2020   Procedure: CARDIOVERSION;  Surgeon: Chilton Si, MD;  Location: [facility] ENDOSCOPY;  Service: Cardiovascular;  Laterality: N/A;   CARDIOVERSION N/A 12/26/2020   Procedure: CARDIOVERSION;  Surgeon: [name], MD;  Location: [facility] ENDOSCOPY;  Service: Cardiovascular;  Laterality: N/A;   CATARACT EXTRACTION W/PHACO Left 05/18/2019   Procedure: CATARACT EXTRACTION PHACO AND INTRAOCULAR LENS PLACEMENT (IOC) LEFT  1:21 11.0% 9.01;  Surgeon: Lockie Mola, MD;  Location: [facility] SURGERY CNTR;  Service: Ophthalmology;  Laterality: Left;  sleep apnea   CATARACT EXTRACTION W/PHACO Right 06/29/2019   Procedure: CATARACT EXTRACTION PHACO  AND INTRAOCULAR LENS PLACEMENT (IOC) RIGHT  01:14.2  14.5%  10.92;  Surgeon: Lockie Mola, MD;  Location: [facility]GERY [facility];  Service: Ophthalmology;  Laterality: Right;  sleep apnea   CYST EXCISION  03/2019   on back    NM MYOCAR PERF WALL MOTION  05/07/2011   Lexiscan: No ishcemia   PERMANENT PACEMAKER INSERTION  03/12/2010   guidant   PPM GENERATOR CHANGEOUT N/A 08/06/2020   Procedure: PPM GENERATOR CHANGEOUT;  Surgeon: [name], MD;  Location: [facility] INVASIVE CV LAB;  Service: Cardiovascular;  Laterality: N/A;   ROTATOR CUFF REPAIR  1996   SHOULDER ARTHROSCOPY     TEE WITHOUT CARDIOVERSION N/A 03/01/2021   Procedure: TRANSESOPHAGEAL ECHOCARDIOGRAM (TEE);  Surgeon: Regan Lemming, MD;  Location: [facility] INVASIVE CV LAB;  Service: Cardiovascular;  Laterality: N/A;   US ECHOCARDIOGRAPHY  05/07/2011   mod. LVH,LA mod. dilated,borderline aortic root dilatation    Outpatient Medications Prior to Visit  Medication Sig Dispense Refill   allopurinol (ZYLOPRIM) 300 MG tablet Take 1 tablet (300 mg total) by mouth daily. 30 tablet 2   amiodarone (PACERONE) 200 MG tablet Take 1 tablet (200 mg total) by mouth in the morning and at bedtime. 180 tablet 3  apixaban (ELIQUIS) 5 MG TABS tablet Take 1 tablet (5 mg total) by mouth 2 (two) times daily. 180 tablet 3   Apoaequorin (PREVAGEN PO) Take 1 tablet by mouth daily.     Ascorbic Acid (VITAMIN C) 1000 MG tablet Take 1,000 mg by mouth daily.     Cholecalciferol (VITAMIN D-3 PO) Take 2,500 Units by mouth at bedtime.     citalopram (CELEXA) 40 MG tablet Take 1 tablet (40 mg total) by mouth daily. MUST RECEIVE FUTURE REFILLS FROM PCP. 30 tablet 0   colchicine 0.6 MG tablet Take 0.5 tablets (0.3 mg total) by mouth daily as needed. 30 tablet 2   empagliflozin (JARDIANCE) 10 MG TABS tablet Take 1 tablet (10 mg total) by mouth daily before breakfast. 90 tablet 1   erythromycin ophthalmic ointment Place 1 Application into both eyes as needed.      montelukast (SINGULAIR) 10 MG tablet Take 10 mg by mouth at bedtime.     Multiple Vitamin (MULTIVITAMIN WITH MINERALS) TABS tablet Take 1 tablet by mouth daily. Senior     Omega-3 Fatty Acids (FISH OIL) 1200 MG CAPS Take 1,200 mg by mouth 2 (two) times daily.      potassium chloride SA (KLOR-CON M) 20 MEQ tablet Take 1 tablet (20 mEq total) by mouth daily. 90 tablet 3   pyridOXINE (VITAMIN B-6) 100 MG tablet Take 100 mg by mouth daily.     tamsulosin (FLOMAX) 0.4 MG CAPS capsule Take 1 capsule (0.4 mg total) by mouth daily. 30 capsule 11   Testosterone Enanthate (XYOSTED) 75 MG/0.5ML SOAJ Inject into the skin once a week.     vitamin B-12 (CYANOCOBALAMIN) 1000 MCG tablet Take 1,000 mcg by mouth daily.     zinc gluconate 50 MG tablet Take 50 mg by mouth daily.     furosemide (LASIX) 40 MG tablet Take 1.5 tablets (60 mg total) by mouth daily. 90 tablet 3   furosemide (LASIX) 20 MG tablet Take 1 tablet (20 mg total) by mouth daily. 90 tablet 3   Testosterone 1.62 % GEL Apply 3 Pump topically daily. Shoulders (Patient not taking: Reported on 06/29/2023)  1   No facility-administered medications prior to visit.     Allergies:   Patient has no known allergies.   Social History   Socioeconomic History   Marital status: Married    Spouse name: Not on file   Number of children: Not on file   Years of education: Not on file   Highest education level: Not on file  Occupational History   Not on file  Tobacco Use   Smoking status: Former    Types: Cigars    Quit date: 05/11/1993    Years since quitting: 30.1   Smokeless tobacco: Former    Types: Chew    Quit date: 05/11/1993   Tobacco comments:    Former smoker 04/29/21  Vaping Use   Vaping status: Never Used  Substance and Sexual Activity   Alcohol use: Not Currently    Comment: social   Drug use: No   Sexual activity: Not on file  Other Topics Concern   Not on file  Social History Narrative   Not on file   Social Determinants of  Health   Financial Resource Strain: Not on file  Food Insecurity: Not on file  Transportation Needs: Not on file  Physical Activity: Not on file  Stress: Not on file  Social Connections: Not on file     Family History:  The patient's family history includes Alzheimer's disease in his father; Dementia in his sister; Heart disease in his sister; Heart failure in his mother; Stroke in his mother.   ROS:   Please see the history of present illness.   All other systems are reviewed and are negative.   PHYSICAL EXAM:   VS:  BP (!) 102/58 (BP Location: Left Arm, Patient Position: Sitting, Cuff Size: Normal)   Pulse 61   Ht 5\' 11"  (1.803 m)   Wt 229 lb 3.2 oz (104 kg)   SpO2 96%   BMI 31.97 kg/m      General: Alert, oriented x3, no distress, mildly obese, healthy pacemaker site Head: no evidence of trauma, PERRL, EOMI, no exophtalmos or lid lag, no myxedema, no xanthelasma; normal ears, nose and oropharynx Neck: Appears to have elevated jugular venous pulsations 7-8 cm and hepatojugular reflux; brisk carotid pulses without delay and no carotid bruits Chest: clear to auscultation, no signs of consolidation by percussion or palpation, normal fremitus, symmetrical and full respiratory excursions Cardiovascular: normal position and quality of the apical impulse, regular rhythm, normal first and paradoxically split second heart sounds, no murmurs, rubs or gallops Abdomen: no tenderness or distention, no masses by palpation, no abnormal pulsatility or arterial bruits, normal bowel sounds, no hepatosplenomegaly Extremities: He has mild pretibial edema, his socks leave rings. Neurological: grossly nonfocal Psych: Normal mood and affect    Wt Readings from Last 3 Encounters:  06/29/23 229 lb 3.2 oz (104 kg)  02/19/23 223 lb 3.2 oz (101.2 kg)  01/20/23 225 lb 9.6 oz (102.3 kg)      Studies/Labs Reviewed:   EKG: ECG performed today shows AV sequentially paced rhythm.  QRS is quite broad  at 226 ms.   ASSESSMENT:    1. Persistent atrial fibrillation (HCC)   2. Chronic diastolic CHF (congestive heart failure) (HCC)   3. On amiodarone therapy      PLAN:  In order of problems listed above:  CHF: He has symptoms that suggest worsening heart failure exacerbation..  EF recently checked is still unchanged at 50-55%.  His blood pressure has been always been low and has limited the use of RAAS inhibitors.  Increased diuretics.  Take furosemide 80 mg in a.m. + 40 mg in p.m. until his weight is under 220 pounds, then go to 80 mg daily as long as his weight stays 220 pounds or less. AFib s/p RFA 2018/redo RFA August 2022/atypical atrial flutter: He has not had any recurrence of atrial fibrillation so this cannot be attributed as a cause for his heart failure exacerbation.   History of atypical atrial flutter, likely with a left atrial circuit, which has had both successful and unsuccessful attempts at overdrive pacing in the past.  Has not had any sustained arrhythmia since November 2022.  Compliant with anticoagulation.  CHA2DS2-VASc 4 (age 89, CHF, hypertension).   No history of embolic events.   SSS: Almost entirely atrial paced over most of last year, although recently he is having less atrial pacing, which may indicate that he is having frequent PACs.  In spite of this his heart rate histograms are quite blunted.  Increased the minute ventilation sensor-based rate response from 8 to 10. 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  He has quite a broad QRS complex on the ECG.  Conceivably he could benefit from CRT, but his LVEF is quite well-preserved at this  time. Amiodarone: We have discussed the potential toxicities of this medication.  He had normal thyroid and liver function tests in May.  Will recheck these in a couple of weeks when we will also recheck his electrolytes and BNP after the adjusted dose of diuretic.   Aware of the possibility of drug interactions and the need to avoid excessive sun exposure.  He should call us if he has otherwise unexplained respiratory symptoms. Hypotension: His blood pressure is borderline at 102/58.  The only medication he takes that could have impact on blood pressure other than the furosemide is tamsulosin.  He does not have symptoms of orthostatic hypotension. PM: normal device function continue remote downloads every 3 months.  Sensor settings adjusted today. OSA: Trying today to facilitate getting new equipment for his CPAP.       Medication Adjustments/Labs and Tests Ordered: Current medicines are reviewed at length with the patient today.  Concerns regarding medicines are outlined above.  Medication changes, Labs and Tests ordered today are listed in the Patient Instructions below. Patient Instructions  Medication Instructions:  Take 80 mg= 2 tablets in the morning and 40 mg= 1 tablet in the afternoon UNTIL your weight is less than 220 pounds. THEN go back to 80 mg= 2 tablets every day *If you need a refill on your cardiac medications before your next appointment, please call your pharmacy*   Lab Work: CMP, BNP, TSH- get this drawn in 2-3 weeks. (Before your follow up appointment) If you have labs (blood work) drawn today and your tests are completely normal, you will receive your results only by: MyChart Message (if you have MyChart) OR A paper copy in the mail If you have any lab test that is abnormal or we need to change your treatment, we will call you to review the results.   Follow-Up: At [facility], you and your health needs are our priority.  As part of our continuing mission to provide you with exceptional heart care, we have created designated Provider Care Teams.  These Care Teams include your primary Cardiologist (physician) and Advanced Practice Providers (APPs -  Physician Assistants and Nurse Practitioners) who all work together to  provide you with the care you need, when you need it.  We recommend signing up for the patient portal called "MyChart".  Sign up information is provided on this After Visit Summary.  MyChart is used to connect with patients for Virtual Visits (Telemedicine).  Patients are able to view lab/test results, encounter notes, upcoming appointments, etc.  Non-urgent messages can be sent to your provider as well.   To learn more about what you can do with MyChart, go to [email or web address].    Your next appointment:    Thursday 07/23/23 at 10:20         Signed, [name], MD  07/03/2023 7:13 PM    [facility] [address] [place], [place], [place]  [address] Phone: [phone]; Fax: [phone]

## 2023-06-29 NOTE — Patient Instructions (Addendum)
Medication Instructions:  Take 80 mg= 2 tablets in the morning and 40 mg= 1 tablet in the afternoon UNTIL your weight is less than 220 pounds. THEN go back to 80 mg= 2 tablets every day *If you need a refill on your cardiac medications before your next appointment, please call your pharmacy*   Lab Work: CMP, BNP, TSH- get this drawn in 2-3 weeks. (Before your follow up appointment) If you have labs (blood work) drawn today and your tests are completely normal, you will receive your results only by: MyChart Message (if you have MyChart) OR A paper copy in the mail If you have any lab test that is abnormal or we need to change your treatment, we will call you to review the results.   Follow-Up: At [facility], you and your health needs are our priority.  As part of our continuing mission to provide you with exceptional heart care, we have created designated Provider Care Teams.  These Care Teams include your primary Cardiologist (physician) and Advanced Practice Providers (APPs -  Physician Assistants and Nurse Practitioners) who all work together to provide you with the care you need, when you need it.  We recommend signing up for the patient portal called "MyChart".  Sign up information is provided on this After Visit Summary.  MyChart is used to connect with patients for Virtual Visits (Telemedicine).  Patients are able to view lab/test results, encounter notes, upcoming appointments, etc.  Non-urgent messages can be sent to your provider as well.   To learn more about what you can do with MyChart, go to [email or web address].    Your next appointment:    Thursday 07/23/23 at 10:20

## 2023-07-03 ENCOUNTER — Encounter: Payer: Self-pay | Admitting: Cardiovascular Disease

## 2023-07-10 LAB — COMPREHENSIVE METABOLIC PANEL
ALT: 14 [IU]/L (ref 0–44)
AST: 15 [IU]/L (ref 0–40)
Albumin: 3.9 g/dL (ref 3.8–4.8)
Alkaline Phosphatase: 133 [IU]/L — ABNORMAL HIGH (ref 44–121)
BUN/Creatinine Ratio: 16 (ref 10–24)
BUN: 19 mg/dL (ref 8–27)
Bilirubin Total: 0.6 mg/dL (ref 0.0–1.2)
CO2: 24 mmol/L (ref 20–29)
Calcium: 9.3 mg/dL (ref 8.6–10.2)
Chloride: 99 mmol/L (ref 96–106)
Creatinine, Ser: 1.17 mg/dL (ref 0.76–1.27)
Globulin, Total: 3.2 g/dL (ref 1.5–4.5)
Glucose: 86 mg/dL (ref 70–99)
Potassium: 4.9 mmol/L (ref 3.5–5.2)
Sodium: 137 mmol/L (ref 134–144)
Total Protein: 7.1 g/dL (ref 6.0–8.5)
eGFR: 64 mL/min/{1.73_m2} (ref >59)

## 2023-07-10 LAB — BRAIN NATRIURETIC PEPTIDE: BNP: 334.9 pg/mL — ABNORMAL HIGH (ref 0.0–100.0)

## 2023-07-10 LAB — TSH: TSH: 3.65 u[IU]/mL (ref 0.450–4.500)

## 2023-07-20 ENCOUNTER — Other Ambulatory Visit: Payer: Self-pay | Admitting: Cardiovascular Disease

## 2023-07-23 ENCOUNTER — Encounter: Payer: Self-pay | Admitting: Cardiovascular Disease

## 2023-07-23 ENCOUNTER — Ambulatory Visit: Payer: BC Managed Care – PPO | Attending: Cardiovascular Disease | Admitting: Cardiovascular Disease

## 2023-07-23 VITALS — BP 108/52 | HR 63 | Ht 71.0 in | Wt 219.8 lb

## 2023-07-23 DIAGNOSIS — G4733 Obstructive sleep apnea (adult) (pediatric): Secondary | ICD-10-CM

## 2023-07-23 DIAGNOSIS — I484 Atypical atrial flutter: Secondary | ICD-10-CM

## 2023-07-23 DIAGNOSIS — I480 Paroxysmal atrial fibrillation: Secondary | ICD-10-CM | POA: Diagnosis not present

## 2023-07-23 DIAGNOSIS — I495 Sick sinus syndrome: Secondary | ICD-10-CM

## 2023-07-23 DIAGNOSIS — I442 Atrioventricular block, complete: Secondary | ICD-10-CM

## 2023-07-23 DIAGNOSIS — Z79899 Other long term (current) drug therapy: Secondary | ICD-10-CM

## 2023-07-23 DIAGNOSIS — Z5181 Encounter for therapeutic drug level monitoring: Secondary | ICD-10-CM

## 2023-07-23 DIAGNOSIS — I5032 Chronic diastolic (congestive) heart failure: Secondary | ICD-10-CM

## 2023-07-23 DIAGNOSIS — Z950 Presence of cardiac pacemaker: Secondary | ICD-10-CM

## 2023-07-23 MED ORDER — FUROSEMIDE 40 MG PO TABS: ORAL_TABLET | ORAL | 3 refills | Status: DC

## 2023-07-23 NOTE — Patient Instructions (Addendum)
Medication Instructions:  Furosemide- Take 80 mg= 2 tablets every morning. If your weight is greater than or equal to 216 pounds, then take an extra 40 mg= 1 tablet in the afternoon. *If you need a refill on your cardiac medications before your next appointment, please call your pharmacy*  Follow-Up: At [facility], you and your health needs are our priority.  As part of our continuing mission to provide you with exceptional heart care, we have created designated Provider Care Teams.  These Care Teams include your primary Cardiologist (physician) and Advanced Practice Providers (APPs -  Physician Assistants and Nurse Practitioners) who all work together to provide you with the care you need, when you need it.  We recommend signing up for the patient portal called "MyChart".  Sign up information is provided on this After Visit Summary.  MyChart is used to connect with patients for Virtual Visits (Telemedicine).  Patients are able to view lab/test results, encounter notes, upcoming appointments, etc.  Non-urgent messages can be sent to your provider as well.   To learn more about what you can do with MyChart, go to [email or web address].    Your next appointment:   6 month(s)  Provider:   [name], MD

## 2023-07-23 NOTE — Progress Notes (Signed)
Patient ID: Shane [name], male   DOB: Nov 14, 1945, 77 y.o.   MRN: [id number]     Cardiology Office Note    Date:  07/25/2023   [name], Shane [name] 1946/07/30, MRN [id number]  PCP:  Shane Shaggy, MD  Cardiologist:   [name], MD   No chief complaint on file.   History of Present Illness:  Shane [name] is a 77 y.o. male with sinus node dysfunction, high-grade AV block, recurrent persistent atrial flutter and atrial fibrillation (endocardial ablation x 2 , most recently 03/01/2021), chronic heart failure (mildly depressed LVEF), obstructive sleep apnea.  He underwent pacemaker change out in 2021 [facility] [employer] Accolade dual-chamber device)  [name] is feeling better.  He is sleeping better through the night and has less shortness of breath.  He has been keeping a detailed log of his weight which has been in the 216-219 range.  He has not had any dizziness when bending over and standing up.  He has not had syncope.  His blood pressure is actually little higher today than it was at his previous appointment, before we increase his diuretics.  He is compliant with CPAP and denies daytime hypersomnolence.  He is trying to avoid salt.  He has not had leg edema.  He has not had chest pain.  He denies any falls, injuries or bleeding problems and continues to take apixaban.  He has not had any focal neurological events.  Pacemaker function is normal.  Generator longevity is 9.5 years.  All lead parameters are in desirable range.  He has 82% atrial pacing and has 100% ventricular pacing.  He does not have any ventricular escape rhythm when pacing is taken down to 30 bpm.  There is also no evidence of any detectable P waves other than retrograde activity from ventricular pacing.  Since his last appointment there has been no evidence of atrial fibrillation or any high ventricular rate.  Heart rate histogram distribution is slightly blunted.  His most recent echocardiogram which was repeated  01/19/2023 actually showed improvement in LVEF which was now 50-55%.  He does have moderate LVH and grade 2 diastolic dysfunction as well as a very low global longitudinal strain of only 9.6%.  The left atrium is severely dilated.  There are no serious valvular abnormalities and the ascending aorta is minimally dilated at 42 mm.  He underwent a PYP scan for cardiac amyloidosis that was interpreted as being equivocal, but upon my review is frankly normal.  Quantitative heart-lung ratio was 1.0.  He has a history of tachycardia-bradycardia syndrome, chronic diastolic heart failure (last echo April 2021 shows mildly depressed LVEF 45-50%), morbid obesity, obstructive sleep apnea on CPAP.  He underwent radiofrequency ablation for atrial fibrillation in August 2018 with a recurrent atrial fibrillation requiring cardioversion in February 2021 and June 2021, then started on amiodarone and underwent successful cardioversion with lasting benefit on April 11, 2020.  He underwent pacemaker generator change out last December [facility] [employer] Accolade, August 06, 2020).  He had recurrent atrial fibrillation in March 2022, symptomatic with difficult rate control.  Amiodarone was initiated.  He had repeat ablation performed in July 2022.  He again returned with atrial flutter, this time well rate controlled.  The amiodarone dose was increased to 200 mg twice daily in mid August.  Overdrive pacing was unsuccessful in the office, but was successful when he presented for cardioversion later the same month.  He has maintained normal rhythm since that time.  Amiodarone has subsequently been reduced to 200 mg once daily.   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   guidant  [facility] [phone])   Sinus node dysfunction (HCC)    Sleep apnea    CPAP   Wears hearing  aid in both ears     Past Surgical History:  Procedure Laterality Date   ATRIAL FIBRILLATION ABLATION N/A 04/03/2017   Procedure: Atrial Fibrillation Ablation;  Surgeon: Regan Lemming, MD;  Location: [facility] INVASIVE CV LAB;  Service: Cardiovascular;  Laterality: N/A;   ATRIAL FIBRILLATION ABLATION N/A 03/01/2021   Procedure: ATRIAL FIBRILLATION ABLATION;  Surgeon: Regan Lemming, MD;  Location: [facility] INVASIVE CV LAB;  Service: Cardiovascular;  Laterality: N/A;   CARDIAC CATHETERIZATION  10/13/1994   No CAD   CARDIAC CATHETERIZATION  05/10/2003   No CAD   CARDIOVERSION N/A 10/21/2016   Procedure: CARDIOVERSION;  Surgeon: [name], MD;  Location: [facility] ENDOSCOPY;  Service: Cardiovascular;  Laterality: N/A;   CARDIOVERSION N/A 05/15/2017   Procedure: CARDIOVERSION;  Surgeon: Pricilla Riffle, MD;  Location: [facility] ENDOSCOPY;  Service: Cardiovascular;  Laterality: N/A;   CARDIOVERSION N/A 10/21/2019   Procedure: CARDIOVERSION;  Surgeon: [name], MD;  Location: [facility] ENDOSCOPY;  Service: Cardiovascular;  Laterality: N/A;   CARDIOVERSION N/A 02/24/2020   Procedure: CARDIOVERSION;  Surgeon: Lewayne Bunting, MD;  Location: [facility] ENDOSCOPY;  Service: Cardiovascular;  Laterality: N/A;   CARDIOVERSION N/A 04/11/2020   Procedure: CARDIOVERSION;  Surgeon: Chilton Si, MD;  Location: [facility] ENDOSCOPY;  Service: Cardiovascular;  Laterality: N/A;   CARDIOVERSION N/A 12/26/2020   Procedure: CARDIOVERSION;  Surgeon: [name], MD;  Location: [facility] ENDOSCOPY;  Service: Cardiovascular;  Laterality: N/A;   CATARACT EXTRACTION W/PHACO Left 05/18/2019   Procedure: CATARACT EXTRACTION PHACO AND INTRAOCULAR LENS PLACEMENT (IOC) LEFT  1:21 11.0% 9.01;  Surgeon: Lockie Mola, MD;  Location: [facility] SURGERY [facility]TR;  Service: Ophthalmology;  Laterality: Left;  sleep apnea   CATARACT EXTRACTION W/PHACO Right 06/29/2019   Procedure: CATARACT EXTRACTION PHACO AND INTRAOCULAR LENS PLACEMENT (IOC) RIGHT  01:14.2  14.5%   10.92;  Surgeon: Lockie Mola, MD;  Location: [facility] SURGERY CNTR;  Service: Ophthalmology;  Laterality: Right;  sleep apnea   CYST EXCISION  03/2019   on back    NM MYOCAR PERF WALL MOTION  05/07/2011   Lexiscan: No ishcemia   PERMANENT PACEMAKER INSERTION  03/12/2010   guidant   PPM GENERATOR CHANGEOUT N/A 08/06/2020   Procedure: PPM GENERATOR CHANGEOUT;  Surgeon: [name], MD;  Location: [facility] INVASIVE CV LAB;  Service: Cardiovascular;  Laterality: N/A;   ROTATOR CUFF REPAIR  1996   SHOULDER ARTHROSCOPY     TEE WITHOUT CARDIOVERSION N/A 03/01/2021   Procedure: TRANSESOPHAGEAL ECHOCARDIOGRAM (TEE);  Surgeon: Regan Lemming, MD;  Location: [facility] INVASIVE CV LAB;  Service: Cardiovascular;  Laterality: N/A;   US ECHOCARDIOGRAPHY  05/07/2011   mod. LVH,LA mod. dilated,borderline aortic root dilatation    Outpatient Medications Prior to Visit  Medication Sig Dispense Refill   allopurinol (ZYLOPRIM) 300 MG tablet Take 1 tablet (300 mg total) by mouth daily. 30 tablet 2   amiodarone (PACERONE) 200 MG tablet Take 1 tablet (200 mg total) by mouth in the morning and at bedtime. 180 tablet 3   apixaban (ELIQUIS) 5 MG TABS tablet Take 1 tablet (5 mg total) by  mouth 2 (two) times daily. 180 tablet 3   Apoaequorin (PREVAGEN PO) Take 1 tablet by mouth daily.     Ascorbic Acid (VITAMIN C) 1000 MG tablet Take 1,000 mg by mouth daily.     Cholecalciferol (VITAMIN D-3 PO) Take 2,500 Units by mouth at bedtime.     citalopram (CELEXA) 40 MG tablet Take 1 tablet (40 mg total) by mouth daily. MUST RECEIVE FUTURE REFILLS FROM PCP. 30 tablet 0   empagliflozin (JARDIANCE) 10 MG TABS tablet Take 1 tablet (10 mg total) by mouth daily before breakfast. 90 tablet 3   erythromycin ophthalmic ointment Place 1 Application into both eyes as needed.     montelukast (SINGULAIR) 10 MG tablet Take 10 mg by mouth at bedtime.     Multiple Vitamin (MULTIVITAMIN WITH MINERALS) TABS tablet Take 1 tablet by mouth daily.  Senior     Omega-3 Fatty Acids (FISH OIL) 1200 MG CAPS Take 1,200 mg by mouth 2 (two) times daily.      potassium chloride SA (KLOR-CON M) 20 MEQ tablet Take 1 tablet (20 mEq total) by mouth daily. 90 tablet 3   pyridOXINE (VITAMIN B-6) 100 MG tablet Take 100 mg by mouth daily.     tamsulosin (FLOMAX) 0.4 MG CAPS capsule Take 1 capsule (0.4 mg total) by mouth daily. 30 capsule 11   Testosterone Enanthate (XYOSTED) 75 MG/0.5ML SOAJ Inject into the skin once a week.     vitamin B-12 (CYANOCOBALAMIN) 1000 MCG tablet Take 1,000 mcg by mouth daily.     zinc gluconate 50 MG tablet Take 50 mg by mouth daily.     furosemide (LASIX) 40 MG tablet Take 80 mg= 2 tablets in the morning and 40 mg= 1 tablet in the afternoon UNTIL your weight is less than 220 pounds. THEN go back to 80 mg= 2 tablets every day 270 tablet 3   colchicine 0.6 MG tablet Take 0.5 tablets (0.3 mg total) by mouth daily as needed. (Patient not taking: Reported on 07/23/2023) 30 tablet 2   No facility-administered medications prior to visit.     Allergies:   Patient has no known allergies.   Social History   Socioeconomic History   Marital status: Married    Spouse name: Not on file   Number of children: Not on file   Years of education: Not on file   Highest education level: Not on file  Occupational History   Not on file  Tobacco Use   Smoking status: Former    Types: Cigars    Quit date: 05/11/1993    Years since quitting: 30.2   Smokeless tobacco: Former    Types: Chew    Quit date: 05/11/1993   Tobacco comments:    Former smoker 04/29/21  Vaping Use   Vaping status: Never Used  Substance and Sexual Activity   Alcohol use: Not Currently    Comment: social   Drug use: No   Sexual activity: Not on file  Other Topics Concern   Not on file  Social History Narrative   Not on file   Social Determinants of Health   Financial Resource Strain: Not on file  Food Insecurity: Not on file  Transportation Needs: Not on  file  Physical Activity: Not on file  Stress: Not on file  Social Connections: Not on file     Family History:  The patient's family history includes Alzheimer's disease in his father; Dementia in his sister; Heart disease in his sister; Heart failure  in his mother; Stroke in his mother.   ROS:   Please see the history of present illness.   All other systems are reviewed and are negative.   PHYSICAL EXAM:   VS:  BP (!) 108/52 (BP Location: Left Arm, Patient Position: Sitting, Cuff Size: Normal)   Pulse 63   Ht 5\' 11"  (1.803 m)   Wt 219 lb 12.8 oz (99.7 kg)   SpO2 94%   BMI 30.66 kg/m      General: Alert, oriented x3, no distress, mildly obese, healthy pacemaker site Head: no evidence of trauma, PERRL, EOMI, no exophtalmos or lid lag, no myxedema, no xanthelasma; normal ears, nose and oropharynx Neck: Appears to have elevated jugular venous pulsations 7-8 cm and hepatojugular reflux; brisk carotid pulses without delay and no carotid bruits Chest: clear to auscultation, no signs of consolidation by percussion or palpation, normal fremitus, symmetrical and full respiratory excursions Cardiovascular: normal position and quality of the apical impulse, regular rhythm, normal first and paradoxically split second heart sounds, no murmurs, rubs or gallops Abdomen: no tenderness or distention, no masses by palpation, no abnormal pulsatility or arterial bruits, normal bowel sounds, no hepatosplenomegaly Extremities: He has mild pretibial edema, his socks leave rings. Neurological: grossly nonfocal Psych: Normal mood and affect    Wt Readings from Last 3 Encounters:  07/23/23 219 lb 12.8 oz (99.7 kg)  06/29/23 229 lb 3.2 oz (104 kg)  02/19/23 223 lb 3.2 oz (101.2 kg)      Studies/Labs Reviewed:   EKG: ECG performed 06/29/2023 is personally reviewed and shows atrial paced, ventricular paced rhythm  ASSESSMENT:    1. Chronic diastolic CHF (congestive heart failure) (HCC)   2.  Atypical atrial flutter (HCC)   3. PAF (paroxysmal atrial fibrillation) (HCC)   4. SSS (sick sinus syndrome) (HCC)   5. CHB (complete heart block) (HCC)   6. Encounter for monitoring amiodarone therapy   7. Presence of cardiac pacemaker   8. OSA (obstructive sleep apnea)       PLAN:  In order of problems listed above:  CHF: Symptoms have improved with extra diuresis.  Did not have any symptoms of hypovolemia even at weight of 216 pounds and will reestablish this weight as his "dry weight target".  EF  unchanged at 50-55%.  His blood pressure has been always been low and has limited the use of RAAS inhibitors.    Take furosemide 80 mg in a.m. + 40 mg in p.m. until his weight is under 216 pounds, then go to 80 mg daily as long as his weight stays 216 pounds or less.  We can continue to "fine-tune" his dry weight target. AFib s/p RFA 2018/redo RFA August 2022/atypical atrial flutter: He has not had any recurrence of atrial fibrillation in many months.   History of atypical atrial flutter, likely with a left atrial circuit, which has had both successful and unsuccessful attempts at overdrive pacing in the past.  Has not had any sustained arrhythmia since November 2022.  Compliant with anticoagulation.  CHA2DS2-VASc 4 (age 67, CHF, hypertension).   No history of embolic events.   SSS: We made is minute ventilation sensor little more aggressive at her last appointment.  Heart rate histograms still remain a little blunted, but he is not particularly active.  No additional changes are made today. 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  He has quite a broad QRS complex  on the ECG.  Conceivably he could benefit from CRT, but his LVEF is quite well-preserved at this time. Amiodarone: Normal liver function tests and TSH 07/09/2023.  Aware of the possibility of drug interactions and the need to avoid excessive sun exposure.  He should  call us if he has otherwise unexplained respiratory symptoms. Hypotension: Blood pressure remains relatively low, but better than at his last appointment.  The only medication he takes that could have impact on blood pressure other than the furosemide is tamsulosin.  He does not have symptoms of orthostatic hypotension. PM: normal device function continue remote downloads every 3 months. OSA: Finally got him new equipment for CPAP.  He reports very good compliance with therapy and denies daytime hypersomnolence.       Medication Adjustments/Labs and Tests Ordered: Current medicines are reviewed at length with the patient today.  Concerns regarding medicines are outlined above.  Medication changes, Labs and Tests ordered today are listed in the Patient Instructions below. Patient Instructions  Medication Instructions:  Furosemide- Take 80 mg= 2 tablets every morning. If your weight is greater than or equal to 216 pounds, then take an extra 40 mg= 1 tablet in the afternoon. *If you need a refill on your cardiac medications before your next appointment, please call your pharmacy*  Follow-Up: At [facility], you and your health needs are our priority.  As part of our continuing mission to provide you with exceptional heart care, we have created designated Provider Care Teams.  These Care Teams include your primary Cardiologist (physician) and Advanced Practice Providers (APPs -  Physician Assistants and Nurse Practitioners) who all work together to provide you with the care you need, when you need it.  We recommend signing up for the patient portal called "MyChart".  Sign up information is provided on this After Visit Summary.  MyChart is used to connect with patients for Virtual Visits (Telemedicine).  Patients are able to view lab/test results, encounter notes, upcoming appointments, etc.  Non-urgent messages can be sent to your provider as well.   To learn more about what you can do  with MyChart, go to [email or web address].    Your next appointment:   6 month(s)  Provider:   [name], MD         Signed, [name], MD  07/25/2023 5:28 PM    [facility] [address] [place], [place], [place]  [address] Phone: [phone]; Fax: [phone]

## 2023-08-18 ENCOUNTER — Other Ambulatory Visit: Payer: Self-pay | Admitting: Cardiovascular Disease

## 2023-08-18 NOTE — Telephone Encounter (Signed)
Pt's pharmacy is requesting a refill on tamsulosin, a non cardiac medication. Would Dr. Royann Shivers like to refill this medication? Please address

## 2023-08-19 NOTE — Telephone Encounter (Signed)
Ok to refill 

## 2023-08-27 ENCOUNTER — Encounter: Payer: Self-pay | Admitting: Cardiology

## 2023-08-27 ENCOUNTER — Telehealth: Payer: Self-pay | Admitting: Cardiovascular Disease

## 2023-08-27 ENCOUNTER — Ambulatory Visit: Payer: BC Managed Care – PPO | Attending: Cardiology | Admitting: Cardiology

## 2023-08-27 VITALS — BP 100/60 | HR 61 | Ht 71.0 in | Wt 225.0 lb

## 2023-08-27 DIAGNOSIS — R42 Dizziness and giddiness: Secondary | ICD-10-CM

## 2023-08-27 DIAGNOSIS — I480 Paroxysmal atrial fibrillation: Secondary | ICD-10-CM

## 2023-08-27 DIAGNOSIS — I495 Sick sinus syndrome: Secondary | ICD-10-CM | POA: Diagnosis not present

## 2023-08-27 DIAGNOSIS — I5032 Chronic diastolic (congestive) heart failure: Secondary | ICD-10-CM | POA: Diagnosis not present

## 2023-08-27 NOTE — Patient Instructions (Signed)
Medication Instructions:   DO NOT TAKE FUROSEMIDE THIS EVENING-TAKE 40 MG OF FUROSEMIDE TWICE DAILY FOR THE NEXT 3 DAYS -THEN INCREASE BACK TO 80 MG IN THE AM AND 40 MG IN THE PM.  *If you need a refill on your cardiac medications before your next appointment, please call your pharmacy*   Follow-Up: At [facility], you and your health needs are our priority.  As part of our continuing mission to provide you with exceptional heart care, we have created designated Provider Care Teams.  These Care Teams include your primary Cardiologist (physician) and Advanced Practice Providers (APPs -  Physician Assistants and Nurse Practitioners) who all work together to provide you with the care you need, when you need it.  We recommend signing up for the patient portal called "MyChart".  Sign up information is provided on this After Visit Summary.  MyChart is used to connect with patients for Virtual Visits (Telemedicine).  Patients are able to view lab/test results, encounter notes, upcoming appointments, etc.  Non-urgent messages can be sent to your provider as well.   To learn more about what you can do with MyChart, go to [email or web address].    Your next appointment:   1 week(s)

## 2023-08-27 NOTE — Telephone Encounter (Signed)
Pt c/o BP issue: STAT if pt c/o blurred vision, one-sided weakness or slurred speech  1. What are your last 5 BP readings? 99/51  2. Are you having any other symptoms (ex. Dizziness, headache, blurred vision, passed out)? Dizziness  3. What is your BP issue? Pt is concerned and stated he doesn't feel right so he'd like a callback from nurse. Pt requesting to be seen in office today but was informed of office not having any openings or cancellations at this moment. Please advise

## 2023-08-27 NOTE — Progress Notes (Signed)
HPI: Follow-up history of paroxysmal atrial fibrillation/flutter, chronic diastolic congestive heart failure, sinus node dysfunction status post pacemaker, obstructive sleep apnea.  Patient is followed by Dr. Royann Shivers. Patient is status post atrial fibrillation ablation August 2018.  He had repeat ablation July 2022.  He is now on amiodarone.  Echocardiogram May 2024 showed normal LV function, moderate left ventricular hypertrophy, grade 2 diastolic dysfunction, severe left atrial enlargement, mildly dilated aortic root at 42 mm.  PYP scan June 2024 normal.  Patient complained of dizziness and was added to my schedule today.  Since last seen patient had episode of dizziness this morning.  He suddenly became dizzy in the kitchen and staggered against the wall.  He has felt lightheaded since then.  He was seen at his urologist today and his systolic blood pressure was 91.  There is been no chest pain, palpitations or syncope.  He states his weight has been coming down since his diuretics were increased in late October.  His goal was 216 pounds and it remains slightly above that.  He denies pedal edema.  Current Outpatient Medications  Medication Sig Dispense Refill   allopurinol (ZYLOPRIM) 300 MG tablet Take 1 tablet (300 mg total) by mouth daily. 30 tablet 2   amiodarone (PACERONE) 200 MG tablet Take 1 tablet (200 mg total) by mouth in the morning and at bedtime. 180 tablet 3   apixaban (ELIQUIS) 5 MG TABS tablet Take 1 tablet (5 mg total) by mouth 2 (two) times daily. 180 tablet 3   Apoaequorin (PREVAGEN PO) Take 1 tablet by mouth daily.     Ascorbic Acid (VITAMIN C) 1000 MG tablet Take 1,000 mg by mouth daily.     Cholecalciferol (VITAMIN D-3 PO) Take 2,500 Units by mouth at bedtime.     citalopram (CELEXA) 40 MG tablet Take 1 tablet (40 mg total) by mouth daily. MUST RECEIVE FUTURE REFILLS FROM PCP. 30 tablet 0   colchicine 0.6 MG tablet Take 0.5 tablets (0.3 mg total) by mouth daily as needed.  30 tablet 2   empagliflozin (JARDIANCE) 10 MG TABS tablet Take 1 tablet (10 mg total) by mouth daily before breakfast. 90 tablet 3   erythromycin ophthalmic ointment Place 1 Application into both eyes as needed.     furosemide (LASIX) 40 MG tablet Take 80 mg= 2 tablets in the morning. If your weight is greater than or equal to 216 pounds, then take an extra 40 mg= 1 tablet in the afternoon. 270 tablet 3   montelukast (SINGULAIR) 10 MG tablet Take 10 mg by mouth at bedtime.     Multiple Vitamin (MULTIVITAMIN WITH MINERALS) TABS tablet Take 1 tablet by mouth daily. Senior     Omega-3 Fatty Acids (FISH OIL) 1200 MG CAPS Take 1,200 mg by mouth 2 (two) times daily.      potassium chloride SA (KLOR-CON M) 20 MEQ tablet Take 1 tablet (20 mEq total) by mouth daily. 90 tablet 3   pyridOXINE (VITAMIN B-6) 100 MG tablet Take 100 mg by mouth daily.     tamsulosin (FLOMAX) 0.4 MG CAPS capsule Take 1 capsule (0.4 mg total) by mouth daily. 30 capsule 0   Testosterone Enanthate (XYOSTED) 75 MG/0.5ML SOAJ Inject into the skin once a week.     vitamin B-12 (CYANOCOBALAMIN) 1000 MCG tablet Take 1,000 mcg by mouth daily.     zinc gluconate 50 MG tablet Take 50 mg by mouth daily.     No current facility-administered medications for  this visit.     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   guidant  [facility] [phone])   Sinus node dysfunction (HCC)    Sleep apnea    CPAP   Wears hearing aid in both ears     Past Surgical History:  Procedure Laterality Date   ATRIAL FIBRILLATION ABLATION N/A 04/03/2017   Procedure: Atrial Fibrillation Ablation;  Surgeon: Regan Lemming, MD;  Location: [facility] INVASIVE CV LAB;  Service: Cardiovascular;  Laterality: N/A;   ATRIAL FIBRILLATION ABLATION N/A 03/01/2021   Procedure: ATRIAL FIBRILLATION ABLATION;  Surgeon:  Regan Lemming, MD;  Location: [facility] INVASIVE CV LAB;  Service: Cardiovascular;  Laterality: N/A;   CARDIAC CATHETERIZATION  10/13/1994   No CAD   CARDIAC CATHETERIZATION  05/10/2003   No CAD   CARDIOVERSION N/A 10/21/2016   Procedure: CARDIOVERSION;  Surgeon: [name], MD;  Location: [facility] ENDOSCOPY;  Service: Cardiovascular;  Laterality: N/A;   CARDIOVERSION N/A 05/15/2017   Procedure: CARDIOVERSION;  Surgeon: Pricilla Riffle, MD;  Location: [facility] ENDOSCOPY;  Service: Cardiovascular;  Laterality: N/A;   CARDIOVERSION N/A 10/21/2019   Procedure: CARDIOVERSION;  Surgeon: [name], MD;  Location: [facility] ENDOSCOPY;  Service: Cardiovascular;  Laterality: N/A;   CARDIOVERSION N/A 02/24/2020   Procedure: CARDIOVERSION;  Surgeon: Lewayne Bunting, MD;  Location: [facility] ENDOSCOPY;  Service: Cardiovascular;  Laterality: N/A;   CARDIOVERSION N/A 04/11/2020   Procedure: CARDIOVERSION;  Surgeon: Chilton Si, MD;  Location: [facility] ENDOSCOPY;  Service: Cardiovascular;  Laterality: N/A;   CARDIOVERSION N/A 12/26/2020   Procedure: CARDIOVERSION;  Surgeon: [name], MD;  Location: [facility] ENDOSCOPY;  Service: Cardiovascular;  Laterality: N/A;   CATARACT EXTRACTION W/PHACO Left 05/18/2019   Procedure: CATARACT EXTRACTION PHACO AND INTRAOCULAR LENS PLACEMENT (IOC) LEFT  1:21 11.0% 9.01;  Surgeon: Lockie Mola, MD;  Location: [facility] SURGERY CNTR;  Service: Ophthalmology;  Laterality: Left;  sleep apnea   CATARACT EXTRACTION W/PHACO Right 06/29/2019   Procedure: CATARACT EXTRACTION PHACO AND INTRAOCULAR LENS PLACEMENT (IOC) RIGHT  01:14.2  14.5%  10.92;  Surgeon: Lockie Mola, MD;  Location: [facility] SURGERY CNTR;  Service: Ophthalmology;  Laterality: Right;  sleep apnea   CYST EXCISION  03/2019   on back    NM MYOCAR PERF WALL MOTION  05/07/2011   Lexiscan: No ishcemia   PERMANENT PACEMAKER INSERTION  03/12/2010   guidant   PPM GENERATOR CHANGEOUT N/A 08/06/2020   Procedure: PPM GENERATOR CHANGEOUT;   Surgeon: [name], MD;  Location: [facility] INVASIVE CV LAB;  Service: Cardiovascular;  Laterality: N/A;   ROTATOR CUFF REPAIR  1996   SHOULDER ARTHROSCOPY     TEE WITHOUT CARDIOVERSION N/A 03/01/2021   Procedure: TRANSESOPHAGEAL ECHOCARDIOGRAM (TEE);  Surgeon: Regan Lemming, MD;  Location: [facility] INVASIVE CV LAB;  Service: Cardiovascular;  Laterality: N/A;   US ECHOCARDIOGRAPHY  05/07/2011   mod. LVH,LA mod. dilated,borderline aortic root dilatation    Social History   Socioeconomic History   Marital status: Married    Spouse name: Not on file   Number of children: Not on file   Years of education: Not on file   Highest education level: Not on file  Occupational History   Not on file  Tobacco Use   Smoking status: Former    Types: Cigars    Quit date: 05/11/1993    Years  since quitting: 30.3   Smokeless tobacco: Former    Types: Chew    Quit date: 05/11/1993   Tobacco comments:    Former smoker 04/29/21  Vaping Use   Vaping status: Never Used  Substance and Sexual Activity   Alcohol use: Not Currently    Comment: social   Drug use: No   Sexual activity: Not on file  Other Topics Concern   Not on file  Social History Narrative   Not on file   Social Drivers of Health   Financial Resource Strain: Not on file  Food Insecurity: Not on file  Transportation Needs: Not on file  Physical Activity: Not on file  Stress: Not on file  Social Connections: Not on file  Intimate Partner Violence: Not on file    Family History  Problem Relation Age of Onset   Stroke Mother    Heart failure Mother    Alzheimer's disease Father    Heart disease Sister    Dementia Sister     ROS: no fevers or chills, productive cough, hemoptysis, dysphasia, odynophagia, melena, hematochezia, dysuria, hematuria, rash, seizure activity, orthopnea, PND, pedal edema, claudication. Remaining systems are negative.  Physical Exam: Well-developed well-nourished in no acute distress.  Skin is warm  and dry.  HEENT is normal.  Neck is supple.  Chest is clear to auscultation with normal expansion.  Cardiovascular exam is regular rate and rhythm.  Abdominal exam nontender or distended. No masses palpated. Extremities show no edema. neuro grossly intact  EKG Interpretation Date/Time:  Thursday August 27 2023 15:35:42 EST Ventricular Rate:  61 PR Interval:  148 QRS Duration:  224 QT Interval:  556 QTC Calculation: 559 R Axis:   -43  Text Interpretation: AV dual-paced rhythm When compared with ECG of 29-Jun-2023 08:42, No significant change was found Confirmed by Olga Millers ([id number]) on 08/27/2023 3:59:11 PM     A/P  1 dizziness-patient had an episode of dizziness earlier today.  He has continued to feel somewhat lightheaded though much improved this afternoon.  His systolic blood pressure at the urologist office was 91 which is likely the cause.  He does not have any focal neurological deficits.  I have asked him to not take his Lasix this afternoon and we will decrease from 80 mg in the morning and 40 mg in the evening to 40 mg twice daily for 2 days then resume previous dose.  Check potassium and renal function.  He will follow his blood pressure at home.  He was also concerned that he was in atrial fibrillation but this is not documented on his electrocardiogram.  I will have him seen back in 1 week by one of our assistants to make sure that he is stable.  2 chronic diastolic congestive heart failure-he appears to be euvolemic.  Continue diuretic as outlined above as well as Jardiance.  We are trying to balance keeping him euvolemic versus making him hypotensive with her diuretic.  3 paroxysmal atrial fibrillation/flutter-patient has had 2 prior ablations.  He is maintaining sinus rhythm on amiodarone.  Continue apixaban.  Recent hemoglobin 11.1.  Will recheck hemoglobin.  4 status post pacemaker-secondary to sick sinus syndrome.  5 history of hypotension/orthostasis-plan as  outlined above.  Note he is on Flomax.  This may need to be changed in the future if his symptoms persist.  6 obstructive sleep apnea-continue CPAP.  Olga Millers, MD

## 2023-08-27 NOTE — Telephone Encounter (Signed)
Patient identification verified by 2 forms. [name], RN    Called and spoke to patient  Patient states:   -is having low BP   -this morning he almost fell due to dizziness   -had to hold on to counter to not fall   -BP at urologist: 99/51   -continues to have a bit of lightheadedness   -has never experienced before   -does not take any BP medications   -11/21 increased lasix to 80mg  in the morning and 40mg  in the evening   -he tries to maintain hydration   -would like appointment  Patient scheduled for OV 12/26 at 3:30pm  Advised patient to present to ED if lightheaded/dizziness worsens or has LOC  Patient verbalized understanding, no questions at this time

## 2023-08-28 LAB — CBC
Hematocrit: 33.5 % — ABNORMAL LOW (ref 37.5–51.0)
Hemoglobin: 10.2 g/dL — ABNORMAL LOW (ref 13.0–17.7)
MCH: 24.5 pg — ABNORMAL LOW (ref 26.6–33.0)
MCHC: 30.4 g/dL — ABNORMAL LOW (ref 31.5–35.7)
MCV: 80 fL (ref 79–97)
Platelets: 302 10*3/uL (ref 150–450)
RBC: 4.17 x10E6/uL (ref 4.14–5.80)
RDW: 16.6 % — ABNORMAL HIGH (ref 11.6–15.4)
WBC: 8.8 10*3/uL (ref 3.4–10.8)

## 2023-08-28 LAB — BASIC METABOLIC PANEL
BUN/Creatinine Ratio: 17 (ref 10–24)
BUN: 21 mg/dL (ref 8–27)
CO2: 20 mmol/L (ref 20–29)
Calcium: 8.8 mg/dL (ref 8.6–10.2)
Chloride: 103 mmol/L (ref 96–106)
Creatinine, Ser: 1.23 mg/dL (ref 0.76–1.27)
Glucose: 104 mg/dL — ABNORMAL HIGH (ref 70–99)
Potassium: 4.1 mmol/L (ref 3.5–5.2)
Sodium: 141 mmol/L (ref 134–144)
eGFR: 60 mL/min/{1.73_m2} (ref >59)

## 2023-09-04 ENCOUNTER — Encounter: Payer: Self-pay | Admitting: Physician Assistant

## 2023-09-04 ENCOUNTER — Ambulatory Visit (INDEPENDENT_AMBULATORY_CARE_PROVIDER_SITE_OTHER): Payer: Self-pay

## 2023-09-04 ENCOUNTER — Ambulatory Visit: Payer: Medicare Other | Attending: Physician Assistant | Admitting: Physician Assistant

## 2023-09-04 VITALS — BP 100/54 | HR 60 | Ht 71.0 in | Wt 225.0 lb

## 2023-09-04 DIAGNOSIS — Z9889 Other specified postprocedural states: Secondary | ICD-10-CM | POA: Diagnosis not present

## 2023-09-04 DIAGNOSIS — R42 Dizziness and giddiness: Secondary | ICD-10-CM

## 2023-09-04 DIAGNOSIS — R079 Chest pain, unspecified: Secondary | ICD-10-CM | POA: Diagnosis not present

## 2023-09-04 DIAGNOSIS — Z950 Presence of cardiac pacemaker: Secondary | ICD-10-CM | POA: Diagnosis not present

## 2023-09-04 DIAGNOSIS — G4733 Obstructive sleep apnea (adult) (pediatric): Secondary | ICD-10-CM

## 2023-09-04 DIAGNOSIS — I442 Atrioventricular block, complete: Secondary | ICD-10-CM

## 2023-09-04 DIAGNOSIS — I5022 Chronic systolic (congestive) heart failure: Secondary | ICD-10-CM

## 2023-09-04 LAB — CUP PACEART REMOTE DEVICE CHECK
Battery Remaining Longevity: 108 mo
Battery Remaining Percentage: 100 %
Brady Statistic RA Percent Paced: 78 %
Brady Statistic RV Percent Paced: 100 %
Date Time Interrogation Session: 20250102112900
Implantable Lead Connection Status: 753985
Implantable Lead Connection Status: 753985
Implantable Lead Implant Date: 20041025
Implantable Lead Implant Date: 20041025
Implantable Lead Location: 753859
Implantable Lead Location: 753860
Implantable Lead Model: 4457
Implantable Lead Model: 4480
Implantable Lead Serial Number: 338209
Implantable Lead Serial Number: 424134
Implantable Pulse Generator Implant Date: 20211206
Lead Channel Impedance Value: 430 Ohm
Lead Channel Impedance Value: 517 Ohm
Lead Channel Setting Pacing Amplitude: 2.5 V
Lead Channel Setting Pacing Amplitude: 2.5 V
Lead Channel Setting Pacing Pulse Width: 0.4 ms
Lead Channel Setting Sensing Sensitivity: 3 mV
Pulse Gen Serial Number: 955727
Zone Setting Status: 755011

## 2023-09-04 MED ORDER — FUROSEMIDE 80 MG PO TABS: 80.0000 mg | ORAL_TABLET | Freq: Every day | ORAL | 3 refills | Status: AC

## 2023-09-04 NOTE — Patient Instructions (Signed)
 Medication Instructions:  DECREASE:  Lasix  80 mg in the morning  *If you need a refill on your cardiac medications before your next appointment, please call your pharmacy*   Testing/Procedures: Your physician has requested that you have an echocardiogram. Echocardiography is a painless test that uses sound waves to create images of your heart. It provides your doctor with information about the size and shape of your heart and how well your heart's chambers and valves are working. This procedure takes approximately one hour. There are no restrictions for this procedure. Please do NOT wear cologne, perfume, aftershave, or lotions (deodorant is allowed). Please arrive 15 minutes prior to your appointment time.  Please note: We ask at that you not bring children with you during ultrasound (echo/ vascular) testing. Due to room size and safety concerns, children are not allowed in the ultrasound rooms during exams. Our front office staff cannot provide observation of children in our lobby area while testing is being conducted. An adult accompanying a patient to their appointment will only be allowed in the ultrasound room at the discretion of the ultrasound technician under special circumstances. We apologize for any inconvenience.    Follow-Up: At [facility], you and your health needs are our priority.  As part of our continuing mission to provide you with exceptional heart care, we have created designated Provider Care Teams.  These Care Teams include your primary Cardiologist (physician) and Advanced Practice Providers (APPs -  Physician Assistants and Nurse Practitioners) who all work together to provide you with the care you need, when you need it.   Your next appointment:   3-4 week(s)  Provider:   Scot Ford, PA while Jerel Balding, MD is in office

## 2023-09-04 NOTE — Progress Notes (Signed)
 Cardiology Office Note:  .   Date:  09/04/2023  ID:  [name], DOB [birth year], MRN [id number] PCP: Corlis Honor BROCKS, MD  [facility] Providers Cardiologist:  Jerel Balding, MD Electrophysiologist:  Soyla Gladis Norton, MD     History of Present Illness: .   JONCARLOS ATKISON is a 78 y.o. 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  He underwent radiofrequency ablation for atrial fibrillation in August 2018.  Due to recurrent A-fib, he underwent cardioversion in February 2021 and again in June 2021.  He was started on amiodarone  and ultimately underwent a successful cardioversion in August 2021.  He had recurrence of A-fib in March 2022, he had repeat ablation in June 2022.  He returned was atrial flutter, amiodarone  dose was increased.  Overdrive pacing attempted in the office which was unsuccessful, but was successful when he presented for cardioversion later the same month.  Echocardiogram in April 2021 showed a mildly depressed EF of 45 to 50%.  EF improved to 50 to 55% on most recent echocardiogram in May 2024, left atrium is severely dilated, ascending aorta dilated at 42 mm.  He underwent PYP scan on 02/16/2023 for cardiac amyloidosis that was interpreted as equivocal, this was reviewed by Dr. Balding who felt it was normal.  Patient was last seen by Dr. Balding in June 2020 for at which time he was doing well.  He is on lasix  80 mg daily with instruction of taking additional 40 mg in the afternoon until his weight is less than 216 pounds.   He was most recently seen by Dr. Pietro as an add-on on 08/27/2023 for evaluation of dizziness.  He does have a history of hypotension.  Systolic blood pressure was 91 in the urologist office in the morning of the visit.  He appears to be euvolemic  during the visit.  His diuretic was decreased from 80 a.m. and 40 p.m. to 40 mg twice daily for 2 days then resume the previous dose.  Patient presents today for follow-up.  Initial blood pressure was 90/46.  On manual recheck by myself it was 100/54.  He says the dizziness was the worst last week, he continued to have intermittent dizziness.  This primarily happens when he bent over and try to get up.  He says cutting back on the diuretic for 3 days did not make a whole lot of difference.  His symptoms still sounds orthostatic in nature, I will reduce Lasix  to 80 mg a.m. only.  I plan to bring the patient back in 3 to 4 weeks for reassessment, if he is still hypotensive, may need to discuss additional midodrine.  The case was discussed with DOD Dr. Barbaraann.  Note, he had a episode of left-sided chest pain that occurred 2 days ago around [place] Year[place] morning.  He says he woke up with left-sided mild ache around 6 AM and it eventually went away by itself around 10 AM.  He never had chest pain like that before and then none since despite ambulation.  He denies any exacerbating factors such as deep inspiration, body rotation or palpation.  Unfortunately with underlying paced rhythm and a left bundle branch block, EKG would not be diagnostic for any ischemia, therefore we did not get EKG today.  After discussing with Dr. Barbaraann, we decided to  proceed with a echocardiogram to make sure his EF is stable.  I plan to see the patient back in 3 to 4 weeks on a day Dr. Francyne is also in the office in case we need to initiate midodrine therapy.  ROS:   He denies chest pain, palpitations, dyspnea, pnd, orthopnea, n, v, syncope, edema, weight gain, or early satiety. All other systems reviewed and are otherwise negative except as noted above.   He does admits to have dizziness  Studies Reviewed: .        Cardiac Studies & Procedures     STRESS TESTS  NM MYOCAR MULTI W/SPECT W 05/07/2011   ECHOCARDIOGRAM  ECHOCARDIOGRAM COMPLETE 01/19/2023  Narrative ECHOCARDIOGRAM REPORT    Patient Name:   AHMARI DUERSON Date of Exam: 01/19/2023 Medical Rec #:  [id number]       Height:       71.0 in Accession #:    [id number]      Weight:       221.2 lb Date of Birth:  [birth year]       BSA:          2.201 m Patient Age:    76 years        BP:           100/62 mmHg Patient Gender: M               HR:           60 bpm. Exam Location:  [address]  Procedure: 2D Echo, Cardiac Doppler, Color Doppler, Strain Analysis and 3D Echo  Indications:    R06.02 SOB  History:        Patient has prior history of Echocardiogram examinations, most recent 12/19/2019. CHF, Arrythmias:Atrial Fibrillation; Signs/Symptoms:Shortness of Breath.  Sonographer:    Elsie Bohr RDCS Referring Phys: [phone] MIHAI CROITORU  IMPRESSIONS   1. Left ventricular ejection fraction, by estimation, is 50 to 55%. The left ventricle has low normal function. The left ventricle has no regional wall motion abnormalities. There is moderate concentric left ventricular hypertrophy. Left ventricular diastolic parameters are consistent with Grade II diastolic dysfunction (pseudonormalization). Elevated left ventricular end-diastolic pressure. The average left ventricular global longitudinal strain is 9.6 %. The global longitudinal strain is abnormal. 2. Right ventricular systolic function is normal. The right ventricular size is normal. 3. Left atrial size was severely dilated. 4. The mitral valve is normal in structure. Trivial mitral valve regurgitation. No evidence of mitral stenosis. 5. The aortic valve is tricuspid. Aortic valve regurgitation is not visualized. No aortic stenosis is present. 6. Aortic dilatation noted. There is mild dilatation of the aortic root, measuring 42 mm.  FINDINGS Left Ventricle: Left ventricular ejection fraction, by estimation, is 50 to 55%. The left ventricle has low normal function. The left  ventricle has no regional wall motion abnormalities. The average left ventricular global longitudinal strain is 9.6 %. The global longitudinal strain is abnormal. The left ventricular internal cavity size was normal in size. There is moderate concentric left ventricular hypertrophy. Left ventricular diastolic parameters are consistent with Grade II diastolic dysfunction (pseudonormalization). Elevated left ventricular end-diastolic pressure.  Right Ventricle: The right ventricular size is normal. No increase in right ventricular wall thickness. Right ventricular systolic function is normal.  Left Atrium: Left atrial size was severely dilated.  Right Atrium: Right atrial size was normal in size.  Pericardium: There is no evidence of pericardial effusion.  Mitral Valve: The mitral valve is normal in structure.  Trivial mitral valve regurgitation. No evidence of mitral valve stenosis.  Tricuspid Valve: The tricuspid valve is normal in structure. Tricuspid valve regurgitation is trivial. No evidence of tricuspid stenosis.  Aortic Valve: The aortic valve is tricuspid. Aortic valve regurgitation is not visualized. Aortic regurgitation PHT measures 831 msec. No aortic stenosis is present.  Pulmonic Valve: The pulmonic valve was normal in structure. Pulmonic valve regurgitation is not visualized. No evidence of pulmonic stenosis.  Aorta: Aortic dilatation noted. There is mild dilatation of the aortic root, measuring 42 mm.  Venous: The inferior vena cava was not well visualized.  IAS/Shunts: No atrial level shunt detected by color flow Doppler.   LEFT VENTRICLE PLAX 2D LVIDd:         5.50 cm   Diastology LVIDs:         3.90 cm   LV e' medial:    4.46 cm/s LV PW:         1.40 cm   LV E/e' medial:  15.0 LV IVS:        1.70 cm   LV e' lateral:   9.36 cm/s LVOT diam:     2.50 cm   LV E/e' lateral: 7.2 LV SV:         94 LV SV Index:   43        2D Longitudinal Strain LVOT Area:     4.91 cm  2D  Strain GLS (A2C):   7.5 % 2D Strain GLS (A3C):   8.3 % 2D Strain GLS (A4C):   12.8 % 2D Strain GLS Avg:     9.6 %  3D Volume EF: 3D EF:        54 % LV EDV:       249 ml LV ESV:       114 ml LV SV:        134 ml  RIGHT VENTRICLE RV S prime:     11.00 cm/s TAPSE (M-mode): 1.5 cm RVSP:           37.8 mmHg  LEFT ATRIUM              Index        RIGHT ATRIUM           Index LA diam:        5.60 cm  2.54 cm/m   RA Pressure: 3.00 mmHg LA Vol (A2C):   106.0 ml 48.16 ml/m  RA Area:     16.60 cm LA Vol (A4C):   132.5 ml 60.21 ml/m  RA Volume:   45.00 ml  20.45 ml/m LA Biplane Vol: 127.0 ml 57.71 ml/m AORTIC VALVE LVOT Vmax:   92.60 cm/s LVOT Vmean:  64.100 cm/s LVOT VTI:    0.192 m AI PHT:      831 msec  AORTA Ao Root diam: 4.20 cm Ao Asc diam:  3.80 cm  MITRAL VALVE               TRICUSPID VALVE MV Area (PHT): 2.06 cm    TR Peak grad:   34.8 mmHg MV Decel Time: 368 msec    TR Vmax:        295.00 cm/s MV E velocity: 67.00 cm/s  Estimated RAP:  3.00 mmHg MV A velocity: 32.20 cm/s  RVSP:           37.8 mmHg MV E/A ratio:  2.08 SHUNTS Systemic VTI:  0.19 m Systemic Diam: 2.50 cm  Annabella Scarce MD Electronically signed  by Annabella Scarce MD Signature Date/Time: 01/19/2023/6:23:28 PM    Final      PYP SCAN  MYOCARDIAL AMYLOID PLANAR AND SPECT 02/16/2023  Narrative   Myocardial uptake was positive for radiotracer uptake. The visual grade of myocardial uptake relative to the ribs was Grade 1 (Myocardial uptake less than rib uptake).   The quantitative H/CL ratio was 1.00   Findings are equivocal (Grade 1) of cardiac ATTR amyloidosis.   No prior study to compare        Risk Assessment/Calculations:    CHA2DS2-VASc Score = 3   This indicates a 3.2% annual risk of stroke. The patient's score is based upon: CHF History: 1 HTN History: 0 Diabetes History: 0 Stroke History: 0 Vascular Disease History: 0 Age Score: 2 Gender Score: 0            Physical  Exam:   VS:  BP (!) 100/54   Pulse 60   Ht 5' 11 (1.803 m)   Wt 225 lb (102.1 kg)   SpO2 96%   BMI 31.38 kg/m    Wt Readings from Last 3 Encounters:  09/04/23 225 lb (102.1 kg)  08/27/23 225 lb (102.1 kg)  07/23/23 219 lb 12.8 oz (99.7 kg)    GEN: Well nourished, well developed in no acute distress NECK: No JVD; No carotid bruits CARDIAC: RRR, no murmurs, rubs, gallops RESPIRATORY:  Clear to auscultation without rales, wheezing or rhonchi  ABDOMEN: Soft, non-tender, non-distended EXTREMITIES:  No edema; No deformity   ASSESSMENT AND PLAN: .    Chest pain: Solitary episode of that occurred 2 days ago, no prior episode and none since.  He described as a left-sided chest pain that lasted from 6 AM until 10 AM.  He did not seek medical attention.  Unfortunately he has paced rhythm and left bundle branch block, therefore EKG would not be very useful.  I recommended echocardiogram to assess wall motion and ejection fraction.  He has been instructed to contact cardiology service if he has any recurrence, if he does have any recurrence, I would have low threshold of ordering stress test.  Dizziness: He has a history of borderline low blood pressure.  He was seen a week ago as an add-on by DOD Dr. Pietro, his Lasix  was cut back from 80 a.m. and 40 p.m. down to 40 mg twice daily for 3 days before going back to the previous dose.  He says this did not make much difference to his dizzy spell.  Initial blood pressure today was 90/46, repeat blood pressure by myself was 100/54.  After discussing with DOD Dr. Barbaraann, we decided to cut his Lasix  down to 80 mg daily.  We will reassess the patient in 3 to 4 weeks, if blood pressure remain low, I would consider addition of midodrine 2.5 mg 3 times daily  Sinus node dysfunction s/p [employer] dual-chamber pacemaker: Followed by Dr. Francyne.  Last device change change out 2021.  Paced rhythm on last EKG from last week.  Atrial  Fibrillation/Flutter Recurrent despite two ablations and amiodarone  therapy. Recent episode of atrial flutter, unsuccessful overdrive pacing attempted in office. -Continue amiodarone  with increased dose.  OSA: Compliant with CPAP therapy.  HFmrEF: EF was 45% in 2021, improved to 50 to 55% in April 2024.  Patient appears to be euvolemic on exam.       Dispo: Follow-up in 3 to 4 weeks with Dr. Francyne or me.  Signed, Scot Ford, PA

## 2023-09-07 ENCOUNTER — Ambulatory Visit (HOSPITAL_COMMUNITY): Payer: BC Managed Care – PPO | Attending: Physician Assistant

## 2023-09-07 DIAGNOSIS — R079 Chest pain, unspecified: Secondary | ICD-10-CM | POA: Diagnosis present

## 2023-09-07 LAB — ECHOCARDIOGRAM COMPLETE
P 1/2 time: 559 ms
S' Lateral: 4 cm

## 2023-09-11 ENCOUNTER — Telehealth: Payer: Self-pay

## 2023-09-11 ENCOUNTER — Other Ambulatory Visit: Payer: Self-pay | Admitting: Gastroenterology

## 2023-09-11 NOTE — Telephone Encounter (Signed)
 I will update all parties involved pt has appt 09/21/23 with Dr. Royann Shivers. Per preop APP appt notes have been reflected need preop clearance.

## 2023-09-11 NOTE — Telephone Encounter (Signed)
   Pre-operative Risk Assessment    Patient Name: Shane [name]  DOB: Apr 26, 1946 MRN: [id number]   Date of last office visit: 09/04/23 Date of next office visit: 09/21/23   Request for Surgical Clearance    Procedure:   Colonoscopy  Date of Surgery:  Clearance 09/29/23                                Surgeon:  Dr. Dianna Specking Surgeon's Group or Practice Name:  [facility] Phone number:  [phone] Fax number:  [phone]   Type of Clearance Requested:   - Medical  - Pharmacy:  Hold Apixaban  (Eliquis )     Type of Anesthesia:   Propofol    Additional requests/questions:    Bonney Ival LOISE Gerome   09/11/2023, 2:16 PM

## 2023-09-11 NOTE — Telephone Encounter (Signed)
 Please advise holding Eliquis prior to colonoscopy on 09/29/2023.   Thank you!  DW

## 2023-09-11 NOTE — Telephone Encounter (Signed)
   Name: Shane [name]  DOB: 1945-11-20  MRN: [id number]  Primary Cardiologist: Jerel Balding, MD  Chart reviewed as part of pre-operative protocol coverage. Because of [name]'s past medical history and time since last visit, he will require a follow-up in-office visit in order to better assess preoperative cardiovascular risk.  Patient has an office visit scheduled on 09/21/2023 with Dr. Balding. Appointment notes have been updated to reflect need for pre-op  evaluation.   Pre-op  covering staff:  - Please contact requesting surgeon's office via preferred method (i.e, phone, fax) to inform them of need for appointment prior to surgery.  This message will also be routed to pharmacy pool for input on holding Eliquis  as requested below so that this information is available to the clearing provider at time of patient's appointment.   Barnie Hila, NP  09/11/2023, 2:53 PM

## 2023-09-11 NOTE — Telephone Encounter (Signed)
 Patient with diagnosis of afib on Eliquis  for anticoagulation.    Procedure: colonoscopy Date of procedure: 09/29/23   CHA2DS2-VASc Score = 3   This indicates a 3.2% annual risk of stroke. The patient's score is based upon: CHF History: 1 HTN History: 0 Diabetes History: 0 Stroke History: 0 Vascular Disease History: 0 Age Score: 2 Gender Score: 0      CrCl 59 ml/min Platelet count 302.  Per office protocol, patient can hold Eliquis  for 2 days prior to procedure.     **This guidance is not considered finalized until pre-operative APP has relayed final recommendations.**

## 2023-09-21 ENCOUNTER — Ambulatory Visit: Payer: BC Managed Care – PPO | Attending: Cardiovascular Disease | Admitting: Cardiovascular Disease

## 2023-09-21 ENCOUNTER — Encounter: Payer: Self-pay | Admitting: Cardiovascular Disease

## 2023-09-21 VITALS — BP 116/54 | HR 60 | Ht 71.0 in | Wt 219.4 lb

## 2023-09-21 DIAGNOSIS — G4733 Obstructive sleep apnea (adult) (pediatric): Secondary | ICD-10-CM

## 2023-09-21 DIAGNOSIS — I4819 Other persistent atrial fibrillation: Secondary | ICD-10-CM

## 2023-09-21 DIAGNOSIS — I428 Other cardiomyopathies: Secondary | ICD-10-CM | POA: Diagnosis not present

## 2023-09-21 DIAGNOSIS — I5042 Chronic combined systolic (congestive) and diastolic (congestive) heart failure: Secondary | ICD-10-CM | POA: Diagnosis not present

## 2023-09-21 DIAGNOSIS — Z950 Presence of cardiac pacemaker: Secondary | ICD-10-CM

## 2023-09-21 DIAGNOSIS — D500 Iron deficiency anemia secondary to blood loss (chronic): Secondary | ICD-10-CM

## 2023-09-21 DIAGNOSIS — Z79899 Other long term (current) drug therapy: Secondary | ICD-10-CM

## 2023-09-21 DIAGNOSIS — Z5181 Encounter for therapeutic drug level monitoring: Secondary | ICD-10-CM

## 2023-09-21 DIAGNOSIS — I442 Atrioventricular block, complete: Secondary | ICD-10-CM

## 2023-09-21 DIAGNOSIS — I495 Sick sinus syndrome: Secondary | ICD-10-CM

## 2023-09-21 DIAGNOSIS — D6869 Other thrombophilia: Secondary | ICD-10-CM

## 2023-09-21 DIAGNOSIS — Z01818 Encounter for other preprocedural examination: Secondary | ICD-10-CM | POA: Diagnosis not present

## 2023-09-21 DIAGNOSIS — I480 Paroxysmal atrial fibrillation: Secondary | ICD-10-CM

## 2023-09-21 DIAGNOSIS — I952 Hypotension due to drugs: Secondary | ICD-10-CM

## 2023-09-21 MED ORDER — TAMSULOSIN HCL 0.4 MG PO CAPS: 0.4000 mg | ORAL_CAPSULE | Freq: Every day | ORAL | 11 refills | Status: DC

## 2023-09-21 MED ORDER — FERROUS SULFATE 325 (65 FE) MG PO TBEC: 325.0000 mg | DELAYED_RELEASE_TABLET | Freq: Every day | ORAL | 3 refills | Status: DC

## 2023-09-21 MED ORDER — FERROUS SULFATE 325 (65 FE) MG PO TBEC: 325.0000 mg | DELAYED_RELEASE_TABLET | Freq: Two times a day (BID) | ORAL | 3 refills | Status: AC

## 2023-09-21 NOTE — Progress Notes (Signed)
Patient ID: Shane [name], male   DOB: 1946/04/24, 78 y.o.   MRN: [id number]     Cardiology Office Note    Date:  09/21/2023   ID:  Shane, [name] Oct 21, 1945, MRN [id number]  PCP:  Shane Shaggy, MD  Cardiologist:   Shane Fair, MD   No chief complaint on file.   History of Present Illness:  Shane [name] is a 78 y.o. male with sinus node dysfunction, high-grade AV block, recurrent persistent atrial flutter and atrial fibrillation (endocardial ablation x 2 , most recently 03/01/2021), chronic heart failure (mildly depressed LVEF), obstructive sleep apnea.  He underwent pacemaker change out in 2021 [facility] [employer] Accolade dual-chamber device).  Over the last several months he has had problems with dizziness related to hypotension, in particular orthostatic hypotension.  At this point all of his medications with blood pressure impact have been discontinued with the exception of tamsulosin, which she needs for his problems with prostatism and the diuretic effects of Jardiance and furosemide.  His dose of furosemide has also been reduced.  His dizziness has improved.  He continues to have a relatively low diastolic blood pressure at 54 mmHg today.  His episodes of dizziness are associated with position changes and there has been no evidence of significant arrhythmia on pacemaker monitoring.  He has also developed mild anemia with microcytic indices and lab test show evidence of significant iron deficiency.  He has not had overt hematemesis/hematochezia/melena and his Hemoccult cards have been negative so far, but he was referred to Dr. Bosie Clos with [facility] to discuss evaluation for iron deficiency anemia.  Dr. Bosie Clos recommended that he undergo both an EGD and a colonoscopy, but Shane [name] is very worried about having a colonoscopy.  Last time this procedure was performed by Dr. Matthias Hughs he developed profound hypotension and Dr. Matthias Hughs told him that he should never have a colonoscopy again.   He has taken that advice to heart and is quite scared about a colonoscopy.  He does not have problems with orthopnea, PND, lower extremity edema or dyspnea with usual household activities.  He has not had any chest pain at rest or with activity.  She reports compliance with CPAP and he denies daytime hypersomnolence.  He has not had palpitations or full-blown syncope.  Not had any focal neurological complaints.  Pacemaker interrogation shows normal device function with estimated generator longevity of about 9 years.  He has 82% atrial pacing with [name] heart rate histogram distribution and 100% ventricular pacing.  He does not have any ventricular escape rhythm and is pacemaker dependent.  He has not had any recent episodes of atrial fibrillation and has very rare and very brief episodes of paroxysmal atrial tachycardia, most recently on 08/21/2023.  There have been no episodes of ventricular tachycardia.  He had a follow-up echocardiogram performed 09/06/2022 that shows a slight reduction in LVEF at 40-45%.  Previous echo in May 2024 estimated EF to be 50-55%.  When reviewing the images side-by-side, there is not really that big of a difference.  There does seem to be fairly profound pacing related dyssynchrony enthesis both apex to base and the septum to the lateral wall) on both studies.  Technetium 27m pyrophosphate scan was negative for cardiac amyloidosis, in my opinion.  Previous remote cardiac catheterizations performed in 1996 and in 2004 there was no evidence of coronary artery disease.  There was no evidence of ischemic perfusion abnormalities on a nuclear stress test in 2012.  He  has very mild evidence of coronary arthrosclerotic calcification.  On a cardiac CT performed in 2022 for atrial fibrillation ablation the calcium score was only 99, corresponding to 32nd percentile for age and gender.  His most recent echocardiogram which was repeated 01/19/2023 actually showed improvement in LVEF which  was now 50-55%.  He does have moderate LVH and grade 2 diastolic dysfunction as well as a very low global longitudinal strain of only 9.6%.  The left atrium is severely dilated.  There are no serious valvular abnormalities and the ascending aorta is minimally dilated at 42 mm.  He underwent a PYP scan for cardiac amyloidosis that was interpreted as being equivocal, but upon my review is frankly normal.  Quantitative heart-lung ratio was 1.0.  He has a history of tachycardia-bradycardia syndrome, chronic diastolic heart failure (last echo April 2021 shows mildly depressed LVEF 45-50%), morbid obesity, obstructive sleep apnea on CPAP.  He underwent radiofrequency ablation for atrial fibrillation in August 2018 with a recurrent atrial fibrillation requiring cardioversion in February 2021 and June 2021, then started on amiodarone and underwent successful cardioversion with lasting benefit on April 11, 2020.  He underwent pacemaker generator change out last December [facility] [employer] Accolade, August 06, 2020).  He had recurrent atrial fibrillation in March 2022, symptomatic with difficult rate control.  Amiodarone was initiated.  He had repeat ablation performed in July 2022.  He again returned with atrial flutter, this time well rate controlled.  The amiodarone dose was increased to 200 mg twice daily in mid August.  Overdrive pacing was unsuccessful in the office, but was successful when he presented for cardioversion later the same month.  He has maintained normal rhythm since that time.  Amiodarone has subsequently been reduced to 200 mg once daily.   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   guidant  [facility] [employer] [phone])   Sinus node dysfunction (HCC)    Sleep apnea    CPAP   Wears hearing aid in both ears     Past Surgical History:   Procedure Laterality Date   ATRIAL FIBRILLATION ABLATION N/A 04/03/2017   Procedure: Atrial Fibrillation Ablation;  Surgeon: Regan Lemming, MD;  Location: [facility] INVASIVE CV LAB;  Service: Cardiovascular;  Laterality: N/A;   ATRIAL FIBRILLATION ABLATION N/A 03/01/2021   Procedure: ATRIAL FIBRILLATION ABLATION;  Surgeon: Regan Lemming, MD;  Location: [facility] INVASIVE CV LAB;  Service: Cardiovascular;  Laterality: N/A;   CARDIAC CATHETERIZATION  10/13/1994   No CAD   CARDIAC CATHETERIZATION  05/10/2003   No CAD   CARDIOVERSION N/A 10/21/2016   Procedure: CARDIOVERSION;  Surgeon: Shane Fair, MD;  Location: [facility] ENDOSCOPY;  Service: Cardiovascular;  Laterality: N/A;   CARDIOVERSION N/A 05/15/2017   Procedure: CARDIOVERSION;  Surgeon: Pricilla Riffle, MD;  Location: [facility] ENDOSCOPY;  Service: Cardiovascular;  Laterality: N/A;   CARDIOVERSION N/A 10/21/2019   Procedure: CARDIOVERSION;  Surgeon: Shane Fair, MD;  Location: [facility] ENDOSCOPY;  Service: Cardiovascular;  Laterality: N/A;   CARDIOVERSION N/A 02/24/2020   Procedure: CARDIOVERSION;  Surgeon: Lewayne Bunting, MD;  Location: [facility] ENDOSCOPY;  Service: Cardiovascular;  Laterality: N/A;   CARDIOVERSION N/A 04/11/2020   Procedure: CARDIOVERSION;  Surgeon: Chilton Si, MD;  Location: [facility] ENDOSCOPY;  Service: Cardiovascular;  Laterality: N/A;   CARDIOVERSION N/A 12/26/2020   Procedure: CARDIOVERSION;  Surgeon: Shane Fair,  MD;  Location: [facility] ENDOSCOPY;  Service: Cardiovascular;  Laterality: N/A;   CATARACT EXTRACTION W/PHACO Left 05/18/2019   Procedure: CATARACT EXTRACTION PHACO AND INTRAOCULAR LENS PLACEMENT (IOC) LEFT  1:21 11.0% 9.01;  Surgeon: Lockie Mola, MD;  Location: [facility] SURGERY CNTR;  Service: Ophthalmology;  Laterality: Left;  sleep apnea   CATARACT EXTRACTION W/PHACO Right 06/29/2019   Procedure: CATARACT EXTRACTION PHACO AND INTRAOCULAR LENS PLACEMENT (IOC) RIGHT  01:14.2  14.5%  10.92;  Surgeon: Lockie Mola, MD;   Location: [facility] SURGERY CNTR;  Service: Ophthalmology;  Laterality: Right;  sleep apnea   CYST EXCISION  03/2019   on back    NM MYOCAR PERF WALL MOTION  05/07/2011   Lexiscan: No ishcemia   PERMANENT PACEMAKER INSERTION  03/12/2010   guidant   PPM GENERATOR CHANGEOUT N/A 08/06/2020   Procedure: PPM GENERATOR CHANGEOUT;  Surgeon: Shane Fair, MD;  Location: [facility] INVASIVE CV LAB;  Service: Cardiovascular;  Laterality: N/A;   ROTATOR CUFF REPAIR  1996   SHOULDER ARTHROSCOPY     TEE WITHOUT CARDIOVERSION N/A 03/01/2021   Procedure: TRANSESOPHAGEAL ECHOCARDIOGRAM (TEE);  Surgeon: Regan Lemming, MD;  Location: [facility] INVASIVE CV LAB;  Service: Cardiovascular;  Laterality: N/A;   US ECHOCARDIOGRAPHY  05/07/2011   mod. LVH,LA mod. dilated,borderline aortic root dilatation    Outpatient Medications Prior to Visit  Medication Sig Dispense Refill   allopurinol (ZYLOPRIM) 300 MG tablet Take 1 tablet (300 mg total) by mouth daily. 30 tablet 2   amiodarone (PACERONE) 200 MG tablet Take 1 tablet (200 mg total) by mouth in the morning and at bedtime. 180 tablet 3   apixaban (ELIQUIS) 5 MG TABS tablet Take 1 tablet (5 mg total) by mouth 2 (two) times daily. 180 tablet 3   Apoaequorin (PREVAGEN PO) Take 1 tablet by mouth daily.     Ascorbic Acid (VITAMIN C) 1000 MG tablet Take 1,000 mg by mouth daily.     Cholecalciferol (VITAMIN D-3 PO) Take 2,500 Units by mouth at bedtime.     citalopram (CELEXA) 40 MG tablet Take 1 tablet (40 mg total) by mouth daily. MUST RECEIVE FUTURE REFILLS FROM PCP. 30 tablet 0   empagliflozin (JARDIANCE) 10 MG TABS tablet Take 1 tablet (10 mg total) by mouth daily before breakfast. 90 tablet 3   furosemide (LASIX) 80 MG tablet Take 1 tablet (80 mg total) by mouth daily. 90 tablet 3   montelukast (SINGULAIR) 10 MG tablet Take 10 mg by mouth at bedtime.     Multiple Vitamin (MULTIVITAMIN WITH MINERALS) TABS tablet Take 1 tablet by mouth daily. Senior     Omega-3 Fatty Acids (FISH  OIL) 1200 MG CAPS Take 1,200 mg by mouth 2 (two) times daily.      potassium chloride SA (KLOR-CON M) 20 MEQ tablet Take 1 tablet (20 mEq total) by mouth daily. 90 tablet 3   pyridOXINE (VITAMIN B-6) 100 MG tablet Take 100 mg by mouth daily.     testosterone cypionate (DEPOTESTOSTERONE CYPIONATE) 200 MG/ML injection Inject 200 mg into the muscle once a week.     vitamin B-12 (CYANOCOBALAMIN) 1000 MCG tablet Take 1,000 mcg by mouth daily.     Wheat Dextrin (BENEFIBER DRINK MIX PO) Take 30 mLs by mouth in the morning and at bedtime. 2 tablespoons a day     zinc gluconate 50 MG tablet Take 50 mg by mouth daily.     tamsulosin (FLOMAX) 0.4 MG CAPS capsule Take 1 capsule (0.4 mg total) by mouth daily.  30 capsule 0   colchicine 0.6 MG tablet Take 0.5 tablets (0.3 mg total) by mouth daily as needed. (Patient not taking: Reported on 09/21/2023) 30 tablet 2   erythromycin ophthalmic ointment Place 1 Application into both eyes as needed. (Patient not taking: Reported on 09/21/2023)     Testosterone Enanthate (XYOSTED) 75 MG/0.5ML SOAJ Inject into the skin once a week. (Patient not taking: Reported on 09/21/2023)     No facility-administered medications prior to visit.     Allergies:   Patient has no known allergies.   Social History   Socioeconomic History   Marital status: Married    Spouse name: Not on file   Number of children: Not on file   Years of education: Not on file   Highest education level: Not on file  Occupational History   Not on file  Tobacco Use   Smoking status: Former    Types: Cigars    Quit date: 05/11/1993    Years since quitting: 30.3   Smokeless tobacco: Former    Types: Chew    Quit date: 05/11/1993   Tobacco comments:    Former smoker 04/29/21  Vaping Use   Vaping status: Never Used  Substance and Sexual Activity   Alcohol use: Not Currently    Comment: social   Drug use: No   Sexual activity: Not on file  Other Topics Concern   Not on file  Social History  Narrative   Not on file   Social Drivers of Health   Financial Resource Strain: Not on file  Food Insecurity: Not on file  Transportation Needs: Not on file  Physical Activity: Not on file  Stress: Not on file  Social Connections: Not on file     Family History:  The patient's family history includes Alzheimer's disease in his father; Dementia in his sister; Heart disease in his sister; Heart failure in his mother; Stroke in his mother.   ROS:   Please see the history of present illness.   All other systems are reviewed and are negative.   PHYSICAL EXAM:   VS:  BP (!) 116/54 (BP Location: Left Arm, Patient Position: Sitting)   Pulse 60   Ht 5\' 11"  (1.803 m)   Wt 219 lb 6.4 oz (99.5 kg)   SpO2 96%   BMI 30.60 kg/m      General: Alert, oriented x3, no distress, mildly obese, healthy pacemaker site Head: no evidence of trauma, PERRL, EOMI, no exophtalmos or lid lag, no myxedema, no xanthelasma; normal ears, nose and oropharynx Neck: Appears to have elevated jugular venous pulsations 7-8 cm and hepatojugular reflux; brisk carotid pulses without delay and no carotid bruits Chest: clear to auscultation, no signs of consolidation by percussion or palpation, normal fremitus, symmetrical and full respiratory excursions Cardiovascular: normal position and quality of the apical impulse, regular rhythm, normal first and paradoxically split second heart sounds, no murmurs, rubs or gallops Abdomen: no tenderness or distention, no masses by palpation, no abnormal pulsatility or arterial bruits, normal bowel sounds, no hepatosplenomegaly Extremities: He has mild pretibial edema, his socks leave rings. Neurological: grossly nonfocal Psych: Normal mood and affect    Wt Readings from Last 3 Encounters:  09/21/23 219 lb 6.4 oz (99.5 kg)  09/04/23 225 lb (102.1 kg)  08/27/23 225 lb (102.1 kg)      Studies/Labs Reviewed:   EKG: Presenting rhythm is atrial ventricular sequential  pacing. Reviewed the ECG tracing from 08/27/2023 which shows AV sequential pacing with  a very broad paced QRS complex with with a duration of 224 ms  EKG Interpretation Date/Time:    Ventricular Rate:    PR Interval:    QRS Duration:    QT Interval:    QTC Calculation:   R Axis:      Text Interpretation:           ASSESSMENT:    1. Persistent atrial fibrillation (HCC)   2. Pre-op evaluation   3. Chronic combined systolic and diastolic heart failure (HCC)   4. Other cardiomyopathy (HCC)   5. PAF (paroxysmal atrial fibrillation) (HCC)   6. Acquired thrombophilia (HCC)   7. Iron deficiency anemia due to chronic blood loss   8. SSS (sick sinus syndrome) (HCC)   9. CHB (complete heart block) (HCC)   10. Encounter for monitoring amiodarone therapy   11. Hypotension due to drugs   12. Pacemaker   13. OSA (obstructive sleep apnea)       PLAN:  In order of problems listed above:  CHF: He has a rather narrow margin of compensation between orthostatic hypotension and symptoms of congestive heart failure.  Seems to be in that range at this time (216-219 pounds).  Fortunately his low blood pressure precludes use of most guideline directed medical therapy.  Suspect that the cardiomyopathy is largely due to pacing induced dyssynchrony which is quite prominent on his echocardiogram and is also supported by a very broad paced QRS at over 220 ms.  LVEF remains in the early depressed range.  At this point upgrade of his device to a resynchronization pacemaker does not appear to be imminently necessary, but we did discuss that being a possible solution for heart failure therapy in the future.   CMP: Possible causes for his cardiomyopathy are considered.  I do not think that his pyrophosphate scan suggest amyloidosis.  He has not had any recent atrial fibrillation or other rhythms that could cause tachycardia cardiomyopathy.  Although he has not had any recent evaluation for ischemic heart  disease, previous studies make this unlikely.  He had normal coronary angiograms in 1996 in 2004, on normal nuclear perfusion study in 2012, the low coronary calcium score on a CT performed in 2022.  He has never had angina pectoris.  I do not think repeat evaluation for ischemic heart disease is indicated at this time. AFib s/p RFA 2018/redo RFA August 2022/atypical atrial flutter: He has not had significant atrial fibrillation in the last couple of years since November 2022.   History of atypical atrial flutter, likely with a left atrial circuit, which has had both successful and unsuccessful attempts at overdrive pacing in the past.   CHA2DS2-VASc 4 (age 86, CHF, hypertension).   No history of embolic events.   Anticoagulation: Has not had any overt bleeding problems but does have mild iron deficiency anemia.  I think temporary interruption of his anticoagulants for procedures such as endoscopy would be a low risk proposition.  He has never had embolic stroke or TIA. Iron deficiency anemia: Will start iron supplements.  We reviewed the fact that by far the most likely cause for his anemia is slow GI bleeding, enhanced by treatment with oral anticoagulants.  Dr. Oneida Arenas has recommended both upper and lower endoscopy but Shane [name] is very worried about possible complications of the colonoscopy, performed in 2018.  He would like to have the upper endoscopy only.  Will discuss this with Dr. Bosie Clos. SSS: Rate histograms are fairly blunted, but he is also quite  sedentary.  No additional changes made to the pacemaker settings today. CHB: He is pacemaker dependent.  He has quite a broad QRS complex on the ECG.   Amiodarone: Normal liver function tests and TSH 07/09/2023.  Aware of the possibility of drug interactions and the need to avoid excessive sun exposure.  He should call us if he has otherwise unexplained respiratory symptoms. Hypotension: Tight margin between orthostatic hypotension and heart failure symptoms.   At this point he does not appear to need midodrine.  Keep weight 216-219 pounds. PM: normal device function continue remote downloads every 3 months. OSA: reports compliance with therapy and denies daytime hypersomnolence.       Medication Adjustments/Labs and Tests Ordered: Current medicines are reviewed at length with the patient today.  Concerns regarding medicines are outlined above.  Medication changes, Labs and Tests ordered today are listed in the Patient Instructions below. Patient Instructions  Medication Instructions:  Ferrous Sulfate 325 mg daily *If you need a refill on your cardiac medications before your next appointment, please call your pharmacy*   Follow-Up: At [facility], you and your health needs are our priority.  As part of our continuing mission to provide you with exceptional heart care, we have created designated Provider Care Teams.  These Care Teams include your primary Cardiologist (physician) and Advanced Practice Providers (APPs -  Physician Assistants and Nurse Practitioners) who all work together to provide you with the care you need, when you need it.  We recommend signing up for the patient portal called "MyChart".  Sign up information is provided on this After Visit Summary.  MyChart is used to connect with patients for Virtual Visits (Telemedicine).  Patients are able to view lab/test results, encounter notes, upcoming appointments, etc.  Non-urgent messages can be sent to your provider as well.   To learn more about what you can do with MyChart, go to [email or web address].    Your next appointment:   6 month(s)  Provider:   [name], MD                 Signed, Shane Fair, MD  09/21/2023 5:56 PM    [facility] [address] [place], [place], [place]  [address] Phone: [phone]; Fax: [phone]

## 2023-09-21 NOTE — Patient Instructions (Addendum)
Medication Instructions:  Ferrous Sulfate 325 mg daily *If you need a refill on your cardiac medications before your next appointment, please call your pharmacy*   Follow-Up: At [facility], you and your health needs are our priority.  As part of our continuing mission to provide you with exceptional heart care, we have created designated Provider Care Teams.  These Care Teams include your primary Cardiologist (physician) and Advanced Practice Providers (APPs -  Physician Assistants and Nurse Practitioners) who all work together to provide you with the care you need, when you need it.  We recommend signing up for the patient portal called "MyChart".  Sign up information is provided on this After Visit Summary.  MyChart is used to connect with patients for Virtual Visits (Telemedicine).  Patients are able to view lab/test results, encounter notes, upcoming appointments, etc.  Non-urgent messages can be sent to your provider as well.   To learn more about what you can do with MyChart, go to [email or web address].    Your next appointment:   6 month(s)  Provider:   [name], MD

## 2023-09-22 ENCOUNTER — Encounter (HOSPITAL_COMMUNITY): Payer: Self-pay | Admitting: Gastroenterology

## 2023-09-29 ENCOUNTER — Ambulatory Visit (HOSPITAL_COMMUNITY)
Admission: RE | Admit: 2023-09-29 | Payer: BC Managed Care – PPO | Source: Home / Self Care | Admitting: Gastroenterology

## 2023-09-29 ENCOUNTER — Encounter (HOSPITAL_COMMUNITY): Admission: RE | Payer: Self-pay | Source: Home / Self Care

## 2023-09-29 SURGERY — ESOPHAGOGASTRODUODENOSCOPY (EGD) WITH PROPOFOL
Anesthesia: Monitor Anesthesia Care

## 2023-10-12 NOTE — Progress Notes (Signed)
 Remote pacemaker transmission.

## 2023-12-04 LAB — CUP PACEART REMOTE DEVICE CHECK
Battery Remaining Longevity: 114 mo
Battery Remaining Percentage: 100 %
Brady Statistic RA Percent Paced: 87 %
Brady Statistic RV Percent Paced: 100 %
Date Time Interrogation Session: 20250404093700
Implantable Lead Connection Status: 753985
Implantable Lead Connection Status: 753985
Implantable Lead Implant Date: 20041025
Implantable Lead Implant Date: 20041025
Implantable Lead Location: 753859
Implantable Lead Location: 753860
Implantable Lead Model: 4457
Implantable Lead Model: 4480
Implantable Lead Serial Number: 338209
Implantable Lead Serial Number: 424134
Implantable Pulse Generator Implant Date: 20211206
Lead Channel Impedance Value: 471 Ohm
Lead Channel Impedance Value: 571 Ohm
Lead Channel Setting Pacing Amplitude: 2.5 V
Lead Channel Setting Pacing Amplitude: 2.5 V
Lead Channel Setting Pacing Pulse Width: 0.4 ms
Lead Channel Setting Sensing Sensitivity: 3 mV
Pulse Gen Serial Number: 955727
Zone Setting Status: 755011

## 2023-12-06 ENCOUNTER — Encounter: Payer: Self-pay | Admitting: Cardiovascular Disease

## 2023-12-07 ENCOUNTER — Ambulatory Visit (INDEPENDENT_AMBULATORY_CARE_PROVIDER_SITE_OTHER): Payer: Self-pay

## 2023-12-07 DIAGNOSIS — I480 Paroxysmal atrial fibrillation: Secondary | ICD-10-CM

## 2023-12-07 DIAGNOSIS — I495 Sick sinus syndrome: Secondary | ICD-10-CM

## 2024-01-19 NOTE — Progress Notes (Signed)
 Remote pacemaker transmission.

## 2024-01-26 ENCOUNTER — Other Ambulatory Visit: Payer: Self-pay | Admitting: Cardiovascular Disease

## 2024-02-15 ENCOUNTER — Other Ambulatory Visit: Payer: Self-pay | Admitting: Cardiovascular Disease

## 2024-02-15 DIAGNOSIS — Z01812 Encounter for preprocedural laboratory examination: Secondary | ICD-10-CM

## 2024-02-15 DIAGNOSIS — I4819 Other persistent atrial fibrillation: Secondary | ICD-10-CM

## 2024-02-15 NOTE — Telephone Encounter (Signed)
 Prescription refill request for Eliquis  received. Indication:afib Last office visit:1/25 Scr:1.23  12/24 Age: 78 Weight:99.5  kg  Prescription refilled

## 2024-02-25 ENCOUNTER — Telehealth: Payer: Self-pay | Admitting: Cardiovascular Disease

## 2024-02-25 MED ORDER — FUROSEMIDE 40 MG PO TABS: 40.0000 mg | ORAL_TABLET | Freq: Every day | ORAL | 3 refills | Status: AC

## 2024-02-25 NOTE — Telephone Encounter (Signed)
 Pt c/o Shortness Of Breath: STAT if SOB developed within the last 24 hours or pt is noticeably SOB on the phone  1. Are you currently SOB (can you hear that pt is SOB on the phone)? No   2. How long have you been experiencing SOB? A week   3. Are you SOB when sitting or when up moving around? Moving around   4. Are you currently experiencing any other symptoms? Just tired

## 2024-02-25 NOTE — Telephone Encounter (Signed)
 Spoke with pt and for 1 week pt has noted no energy ,wheezing,SOB and increase in weight  225 lb today the other day was 222 lb  but previously had been as low as 216. Per pt no change in salt intake  and had been taking 120 mg of Lasix  previously but due to hypotension was decreased to 80 mg since about March . Will forward to Dr Francyne for review and recommendations . Pt has upcoming appt on 7/24 at 9:40 /cy

## 2024-02-25 NOTE — Telephone Encounter (Signed)
 Spoke with patient.  He is not home now but will go home and send a transmission tonight for us  to review for any abnormality or is possibly back in AF for Dr. [name].  Will notify patient after we receive tomorrow.

## 2024-02-25 NOTE — Telephone Encounter (Signed)
 Increase furosemide  to 120 mg daily. Keep daily weight and BP log. Do his PM download early to see if he is maybe back in AFib (would be due for a download next week anyway). If no better over the weekend, please add on to end of my schedule on Wednesday next week

## 2024-02-25 NOTE — Telephone Encounter (Signed)
Pt aware of recommendations ./cy 

## 2024-02-26 NOTE — Telephone Encounter (Signed)
 Spoke with patient and discussed mychart message from Dr. [name].  He just started the 40 mg of lasix  today. He states he will give us  a call if he needs anything else.

## 2024-02-26 NOTE — Telephone Encounter (Signed)
 Transmission received:  Patient is currently AP/VP with 0% AF burden.  Forwarding to Dr. Francyne to review.

## 2024-02-26 NOTE — Telephone Encounter (Signed)
 Pt calling requesting cb to discuss findings

## 2024-03-07 ENCOUNTER — Ambulatory Visit: Payer: Self-pay

## 2024-03-07 DIAGNOSIS — I4819 Other persistent atrial fibrillation: Secondary | ICD-10-CM

## 2024-03-08 LAB — CUP PACEART REMOTE DEVICE CHECK
Battery Remaining Longevity: 108 mo
Battery Remaining Percentage: 100 %
Brady Statistic RA Percent Paced: 89 %
Brady Statistic RV Percent Paced: 100 %
Date Time Interrogation Session: 20250707000100
Implantable Lead Connection Status: 753985
Implantable Lead Connection Status: 753985
Implantable Lead Implant Date: 20041025
Implantable Lead Implant Date: 20041025
Implantable Lead Location: 753859
Implantable Lead Location: 753860
Implantable Lead Model: 4457
Implantable Lead Model: 4480
Implantable Lead Serial Number: 338209
Implantable Lead Serial Number: 424134
Implantable Pulse Generator Implant Date: 20211206
Lead Channel Impedance Value: 528 Ohm
Lead Channel Impedance Value: 605 Ohm
Lead Channel Setting Pacing Amplitude: 2.5 V
Lead Channel Setting Pacing Amplitude: 2.5 V
Lead Channel Setting Pacing Pulse Width: 0.4 ms
Lead Channel Setting Sensing Sensitivity: 3 mV
Pulse Gen Serial Number: 955727
Zone Setting Status: 755011

## 2024-03-09 ENCOUNTER — Telehealth: Payer: Self-pay | Admitting: Cardiovascular Disease

## 2024-03-09 NOTE — Telephone Encounter (Signed)
 Patient stated he was following up on a message he received in MyChart.

## 2024-03-11 NOTE — Telephone Encounter (Signed)
 Pt reports that he has not been able to get into mychart. Tried to help over the phone, sent link to change password. Did not work. Gave him the number for MyChart help [phone].   Read him the last message sent over mychart, and he said that he was already aware of that one. No other updates that I know of. He said he will call the mychart IT on Monday to see if they can help get into his account.

## 2024-03-14 ENCOUNTER — Ambulatory Visit: Payer: Self-pay | Admitting: Cardiovascular Disease

## 2024-03-24 ENCOUNTER — Encounter: Payer: Self-pay | Admitting: Cardiovascular Disease

## 2024-03-24 ENCOUNTER — Ambulatory Visit: Attending: Cardiovascular Disease | Admitting: Cardiovascular Disease

## 2024-03-24 VITALS — BP 92/60 | HR 70 | Ht 71.0 in | Wt 225.0 lb

## 2024-03-24 DIAGNOSIS — I480 Paroxysmal atrial fibrillation: Secondary | ICD-10-CM

## 2024-03-24 DIAGNOSIS — D509 Iron deficiency anemia, unspecified: Secondary | ICD-10-CM

## 2024-03-24 DIAGNOSIS — Z79899 Other long term (current) drug therapy: Secondary | ICD-10-CM

## 2024-03-24 DIAGNOSIS — I484 Atypical atrial flutter: Secondary | ICD-10-CM

## 2024-03-24 DIAGNOSIS — I4819 Other persistent atrial fibrillation: Secondary | ICD-10-CM

## 2024-03-24 DIAGNOSIS — D500 Iron deficiency anemia secondary to blood loss (chronic): Secondary | ICD-10-CM | POA: Diagnosis not present

## 2024-03-24 DIAGNOSIS — I5042 Chronic combined systolic (congestive) and diastolic (congestive) heart failure: Secondary | ICD-10-CM

## 2024-03-24 DIAGNOSIS — I442 Atrioventricular block, complete: Secondary | ICD-10-CM

## 2024-03-24 DIAGNOSIS — R0789 Other chest pain: Secondary | ICD-10-CM | POA: Diagnosis not present

## 2024-03-24 DIAGNOSIS — D6869 Other thrombophilia: Secondary | ICD-10-CM

## 2024-03-24 DIAGNOSIS — I428 Other cardiomyopathies: Secondary | ICD-10-CM

## 2024-03-24 DIAGNOSIS — R0602 Shortness of breath: Secondary | ICD-10-CM | POA: Diagnosis not present

## 2024-03-24 DIAGNOSIS — G4733 Obstructive sleep apnea (adult) (pediatric): Secondary | ICD-10-CM

## 2024-03-24 DIAGNOSIS — I495 Sick sinus syndrome: Secondary | ICD-10-CM

## 2024-03-24 DIAGNOSIS — Z950 Presence of cardiac pacemaker: Secondary | ICD-10-CM

## 2024-03-24 DIAGNOSIS — Z5181 Encounter for therapeutic drug level monitoring: Secondary | ICD-10-CM

## 2024-03-24 MED ORDER — AMIODARONE HCL 400 MG PO TABS: 400.0000 mg | ORAL_TABLET | Freq: Every day | ORAL | 3 refills | Status: AC

## 2024-03-24 NOTE — Patient Instructions (Signed)
 Medication Instructions:  Increase Amiodarone  to 400 mg daily  *If you need a refill on your cardiac medications before your next appointment, please call your pharmacy*  Lab Work: CMP, TSH, BNP, CBC If you have labs (blood work) drawn today and your tests are completely normal, you will receive your results only by: MyChart Message (if you have MyChart) OR A paper copy in the mail If you have any lab test that is abnormal or we need to change your treatment, we will call you to review the results.  Testing/Procedures: Send in an extra device download in one week     Please report to Radiology at the [facility] Main Entrance 30 minutes early for your test.  [address] [place], [place] [address]     How to Prepare for Your Cardiac PET/CT Stress Test:  Nothing to eat or drink, except water, 3 hours prior to arrival time.  NO caffeine/decaffeinated products, or chocolate 12 hours prior to arrival. (Please note decaffeinated beverages (teas/coffees) still contain caffeine).  If you have caffeine within 12 hours prior, the test will need to be rescheduled.  Medication instructions: Do not take erectile dysfunction medications for 72 hours prior to test (sildenafil, tadalafil) Do not take nitrates (isosorbide mononitrate, Ranexa) the day before or day of test Do not take tamsulosin  the day before or morning of test Hold theophylline containing medications for 12 hours. Hold Dipyridamole 48 hours prior to the test.  Diabetic Preparation: If able to eat breakfast prior to 3 hour fasting, you may take all medications, including your insulin. Do not worry if you miss your breakfast dose of insulin - start at your next meal. If you do not eat prior to 3 hour fast-Hold all diabetes (oral and insulin) medications. Patients who wear a continuous glucose monitor MUST remove the device prior to scanning.  You may take your remaining medications with water.  NO perfume,  cologne or lotion on chest or abdomen area. FEMALES - Please avoid wearing dresses to this appointment.  Total time is 1 to 2 hours; you may want to bring reading material for the waiting time.  IF YOU THINK YOU MAY BE PREGNANT, OR ARE NURSING PLEASE INFORM THE TECHNOLOGIST.  In preparation for your appointment, medication and supplies will be purchased.  Appointment availability is limited, so if you need to cancel or reschedule, please call the Radiology Department Scheduler at [phone] 24 hours in advance to avoid a cancellation fee of $100.00  What to Expect When you Arrive:  Once you arrive and check in for your appointment, you will be taken to a preparation room within the Radiology Department.  A technologist or Nurse will obtain your medical history, verify that you are correctly prepped for the exam, and explain the procedure.  Afterwards, an IV will be started in your arm and electrodes will be placed on your skin for EKG monitoring during the stress portion of the exam. Then you will be escorted to the PET/CT scanner.  There, staff will get you positioned on the scanner and obtain a blood pressure and EKG.  During the exam, you will continue to be connected to the EKG and blood pressure machines.  A small, safe amount of a radioactive tracer will be injected in your IV to obtain a series of pictures of your heart along with an injection of a stress agent.    After your Exam:  It is recommended that you eat a meal and drink  a caffeinated beverage to counter act any effects of the stress agent.  Drink plenty of fluids for the remainder of the day and urinate frequently for the first couple of hours after the exam.  Your doctor will inform you of your test results within 7-10 business days.  For more information and frequently asked questions, please visit our website: [email or web address]  For questions about your test or how to prepare for your test, please  call: Cardiac Imaging Nurse Navigators Office: [phone]   Follow-Up: At [facility], you and your health needs are our priority.  As part of our continuing mission to provide you with exceptional heart care, our providers are all part of one team.  This team includes your primary Cardiologist (physician) and Advanced Practice Providers or APPs (Physician Assistants and Nurse Practitioners) who all work together to provide you with the care you need, when you need it.  Your next appointment:   2 month(s)  Provider:   Jerel Balding, MD    We recommend signing up for the patient portal called MyChart.  Sign up information is provided on this After Visit Summary.  MyChart is used to connect with patients for Virtual Visits (Telemedicine).  Patients are able to view lab/test results, encounter notes, upcoming appointments, etc.  Non-urgent messages can be sent to your provider as well.   To learn more about what you can do with MyChart, go to [email or web address].

## 2024-03-24 NOTE — Progress Notes (Signed)
 Patient ID: Shane [name], male   DOB: 1946-03-08, 78 y.o.   MRN: [id number]     Cardiology Office Note    Date:  03/24/2024   [name] May 06, 1946, MRN [id number]  PCP:  Shane Honor BROCKS, MD  Cardiologist:   Jerel Balding, MD   No chief complaint on file.   History of Present Illness:  Shane [name] is a 78 y.o. male with sinus node dysfunction, high-grade AV block, recurrent persistent atrial flutter and atrial fibrillation (endocardial ablation x 2 , most recently 03/01/2021), chronic heart failure (mildly depressed LVEF), obstructive sleep apnea.  He underwent pacemaker change out in 2021 [facility] [employer] Accolade dual-chamber device).  Over the last couple of weeks he has been feeling worse.  He has reduced stamina and becomes short of breath when bending over.  He has occasional problems with dizziness when he stands up too quickly, but this has not changed much.  He has not had issues with lower extremity edema, orthopnea or PND.  Has not experienced severe dizziness or syncope.  He has had some problems with worsening exertional dyspnea and occasional chest tightness that resolved with rest.  He is not taking any medications with direct effect on blood pressure, but does take Jardiance  for heart failure and tamsulosin  for prostatism.  Interrogation of his pacemaker shows that for the last roughly 12 days he has had persistent atrial mode switch.  Senting rhythm is atrial tachycardia/slow atrial flutter with a cycle length at about 540 ms.  We tried to perform overdrive pacing in the arrhythmia immediately switched to rapid atrial flutter with a cycle length of around 250 ms.  Additional attempts at overdrive pacing with a gradually shorter cycle lengths eventually led to deterioration to atrial fibrillation.  During these changes in rhythm there was really no change in his symptoms and the ventricular rate remained around 60 bpm.  Otherwise pacemaker function is normal  [employer] Accolade implanted as a generator change out in 2021, atrial lead 480 fine-line, ventricular lead 4575 fine-line).  Until the onset of the atrial arrhythmia he had 90% atrial pacing and 100% ventricular pacing.  Estimated generator longevity is over 8 years.  On both leads there are unchanged sensing, capture threshold and impedance parameters (see attached device check).  There have been no episodes of high ventricular rate.  Interestingly, he is not device dependent, probably after the reduction in the dose of amiodarone  to only 200 mg daily.  His mild anemia has improved with iron supplementation and his most recent hemoglobin was back up to 13.8.  His TSH a few months ago was borderline at 5.632 (08/17/2023) and a month earlier had been completely normal at 3.650 (07/09/2023).  He has not had overt hematemesis/hematochezia/melena and his Hemoccult cards have been negative so far, but he was referred to Dr. Dianna with [facility] to discuss evaluation for iron deficiency anemia.  Dr. Dianna recommended that he undergo both an EGD and a colonoscopy, but Shane [name] is very worried about having a colonoscopy.  Last time this procedure was performed by Dr. Donnald he developed profound hypotension and Dr. Donnald told him that he should never have a colonoscopy again.  He has taken that advice to heart and is quite scared about a colonoscopy.  He has had extensive workup for structural heart disease: -  echocardiogram performed /09/07/2023 that shows a slight reduction in LVEF at 40-45%.  Previous echo in May 2024 estimated EF to be  50-55%.  When reviewing the images side-by-side, there is not really that big of a difference.  There does seem to be fairly profound pacing related dyssynchrony (both apex to base and septum to lateral wall) on both studies.  His echoes have generally shown evidence of mild or moderate LVH and reduced global longitudinal strain with a severely dilated left atrium.  He  has never had any serious valvular abnormalities. - Technetium 53m pyrophosphate scan was negative for cardiac amyloidosis, in my opinion.  Heart-lung ratio was 1.0. - Previous remote cardiac catheterizations performed in 1996 and in 2004 there was no evidence of coronary artery disease.   - There was no evidence of ischemic perfusion abnormalities on a nuclear stress test in 2012.   - On a cardiac CT performed in 2022 for atrial fibrillation ablation the calcium score was only 99, corresponding to 32nd percentile for age and gender.  He has previous undergone 2 ablation procedures for atrial arrhythmia: - RF ablation August 2018.  Required cardioversion for recurrent atrial fibrillation in February 2021 and June 2021 and August 2021 with successful maintenance of sinus rhythm only after addition of amiodarone .  Again required cardioversion for symptomatic atrial fibrillation with difficult to control rates in March 2022 - Had repeat ablation in July 2022 with Dr. Inocencio.  Returned with atrial flutter with controlled ventricular rate and amiodarone  dose was increased to 400 mg daily in August 2022.  Had successful overdrive pacing.   He has a history of tachycardia-bradycardia syndrome, chronic diastolic heart failure, morbid obesity, obstructive sleep apnea on CPAP.  He underwent radiofrequency ablation for atrial fibrillation in August 2018 with a recurrent atrial fibrillation requiring cardioversion in February 2021 and June 2021, then started on amiodarone  and underwent successful cardioversion with lasting benefit on April 11, 2020.  He underwent pacemaker generator change out last December [facility] [employer] Accolade, August 06, 2020).  He had recurrent atrial fibrillation in March 2022, symptomatic with difficult rate control.  Amiodarone  was initiated.  He had repeat ablation performed in July 2022.  He again returned with atrial flutter, this time well rate controlled.  The amiodarone  dose was  increased to 200 mg twice daily in mid August.  Overdrive pacing was unsuccessful in the office, but was successful when he presented for cardioversion later the same month.  He has maintained normal rhythm since that time.  Amiodarone  has subsequently been reduced to 200 mg once daily.   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   guidant  [facility] [phone])   Sinus node dysfunction (HCC)    Sleep apnea    CPAP   Wears hearing aid in both ears     Past Surgical History:  Procedure Laterality Date   ATRIAL FIBRILLATION ABLATION N/A 04/03/2017   Procedure: Atrial Fibrillation Ablation;  Surgeon: Inocencio Soyla Lunger, MD;  Location: [facility] INVASIVE CV LAB;  Service: Cardiovascular;  Laterality: N/A;   ATRIAL FIBRILLATION ABLATION N/A 03/01/2021   Procedure: ATRIAL FIBRILLATION ABLATION;  Surgeon: Inocencio Soyla Lunger, MD;  Location: [facility] INVASIVE CV LAB;  Service: Cardiovascular;  Laterality: N/A;   CARDIAC CATHETERIZATION  10/13/1994   No CAD   CARDIAC CATHETERIZATION  05/10/2003   No CAD   CARDIOVERSION N/A 10/21/2016   Procedure: CARDIOVERSION;  Surgeon: Jerel Balding, MD;  Location: [facility] ENDOSCOPY;  Service: Cardiovascular;  Laterality: N/A;   CARDIOVERSION N/A 05/15/2017   Procedure: CARDIOVERSION;  Surgeon: Okey Vina GAILS, MD;  Location: [facility] ENDOSCOPY;  Service: Cardiovascular;  Laterality: N/A;   CARDIOVERSION N/A 10/21/2019   Procedure: CARDIOVERSION;  Surgeon: Francyne Headland, MD;  Location: [facility] ENDOSCOPY;  Service: Cardiovascular;  Laterality: N/A;   CARDIOVERSION N/A 02/24/2020   Procedure: CARDIOVERSION;  Surgeon: Pietro Redell RAMAN, MD;  Location: [facility] ENDOSCOPY;  Service: Cardiovascular;  Laterality: N/A;   CARDIOVERSION N/A 04/11/2020   Procedure: CARDIOVERSION;  Surgeon: Raford Riggs, MD;  Location: [facility] ENDOSCOPY;  Service:  Cardiovascular;  Laterality: N/A;   CARDIOVERSION N/A 12/26/2020   Procedure: CARDIOVERSION;  Surgeon: Francyne Headland, MD;  Location: [facility] ENDOSCOPY;  Service: Cardiovascular;  Laterality: N/A;   CATARACT EXTRACTION W/PHACO Left 05/18/2019   Procedure: CATARACT EXTRACTION PHACO AND INTRAOCULAR LENS PLACEMENT (IOC) LEFT  1:21 11.0% 9.01;  Surgeon: Mittie Gaskin, MD;  Location: [facility] SURGERY CNTR;  Service: Ophthalmology;  Laterality: Left;  sleep apnea   CATARACT EXTRACTION W/PHACO Right 06/29/2019   Procedure: CATARACT EXTRACTION PHACO AND INTRAOCULAR LENS PLACEMENT (IOC) RIGHT  01:14.2  14.5%  10.92;  Surgeon: Mittie Gaskin, MD;  Location: [facility] SURGERY CNTR;  Service: Ophthalmology;  Laterality: Right;  sleep apnea   CYST EXCISION  03/2019   on back    NM MYOCAR PERF WALL MOTION  05/07/2011   Lexiscan: No ishcemia   PERMANENT PACEMAKER INSERTION  03/12/2010   guidant   PPM GENERATOR CHANGEOUT N/A 08/06/2020   Procedure: PPM GENERATOR CHANGEOUT;  Surgeon: Francyne Headland, MD;  Location: [facility] INVASIVE CV LAB;  Service: Cardiovascular;  Laterality: N/A;   ROTATOR CUFF REPAIR  1996   SHOULDER ARTHROSCOPY     TEE WITHOUT CARDIOVERSION N/A 03/01/2021   Procedure: TRANSESOPHAGEAL ECHOCARDIOGRAM (TEE);  Surgeon: Inocencio Soyla Lunger, MD;  Location: [facility] INVASIVE CV LAB;  Service: Cardiovascular;  Laterality: N/A;   US  ECHOCARDIOGRAPHY  05/07/2011   mod. LVH,LA mod. dilated,borderline aortic root dilatation    Outpatient Medications Prior to Visit  Medication Sig Dispense Refill   allopurinol  (ZYLOPRIM ) 300 MG tablet Take 1 tablet (300 mg total) by mouth daily. 30 tablet 2   apixaban  (ELIQUIS ) 5 MG TABS tablet Take 1 tablet (5 mg total) by mouth 2 (two) times daily. 180 tablet 1   Apoaequorin (PREVAGEN PO) Take 1 tablet by mouth daily.     Ascorbic Acid  (VITAMIN C ) 1000 MG tablet Take 1,000 mg by mouth daily.     Cholecalciferol  (VITAMIN D -3 PO) Take 2,500 Units by mouth at bedtime.      citalopram  (CELEXA ) 40 MG tablet Take 1 tablet (40 mg total) by mouth daily. MUST RECEIVE FUTURE REFILLS FROM PCP. 30 tablet 0   doxycycline (VIBRAMYCIN) 100 MG capsule Take 100 mg by mouth 2 (two) times daily.     empagliflozin  (JARDIANCE ) 10 MG TABS tablet Take 1 tablet (10 mg total) by mouth daily before breakfast. 90 tablet 3   ferrous sulfate  325 (65 FE) MG EC tablet Take 1 tablet (325 mg total) by mouth 2 (two) times daily. (Patient taking differently: Take 325 mg by mouth daily at 6 (six) AM.) 180 tablet 3   furosemide  (LASIX ) 40 MG tablet Take 1 tablet (40 mg total) by mouth daily. 90 tablet 3   furosemide  (LASIX ) 80 MG tablet Take 1 tablet (80 mg total) by mouth daily. 90 tablet 3   montelukast  (SINGULAIR ) 10 MG tablet Take 10 mg by mouth at bedtime.     Multiple Vitamin (MULTIVITAMIN WITH MINERALS)  TABS tablet Take 1 tablet by mouth daily. Senior     Omega-3 Fatty Acids (FISH OIL) 1200 MG CAPS Take 1,200 mg by mouth 2 (two) times daily.      potassium chloride  SA (KLOR-CON  M) 20 MEQ tablet Take 1 tablet (20 mEq total) by mouth daily. 90 tablet 0   pyridOXINE (VITAMIN B-6) 100 MG tablet Take 100 mg by mouth daily.     tamsulosin  (FLOMAX ) 0.4 MG CAPS capsule Take 1 capsule (0.4 mg total) by mouth daily. 30 capsule 11   testosterone  cypionate (DEPOTESTOSTERONE CYPIONATE) 200 MG/ML injection Inject 200 mg into the muscle once a week.     vitamin B-12 (CYANOCOBALAMIN ) 1000 MCG tablet Take 1,000 mcg by mouth daily.     Wheat Dextrin (BENEFIBER DRINK MIX PO) Take 30 mLs by mouth in the morning and at bedtime. 2 tablespoons a day     zinc gluconate 50 MG tablet Take 50 mg by mouth daily.     amiodarone  (PACERONE ) 200 MG tablet Take 1 tablet (200 mg total) by mouth in the morning and at bedtime. 180 tablet 3   colchicine  0.6 MG tablet Take 0.5 tablets (0.3 mg total) by mouth daily as needed. (Patient not taking: Reported on 03/24/2024) 30 tablet 2   erythromycin ophthalmic ointment Place 1  Application into both eyes as needed. (Patient not taking: Reported on 03/24/2024)     No facility-administered medications prior to visit.     Allergies:   Patient has no known allergies.   Family History:  The patient's family history includes Alzheimer's disease in his father; Dementia in his sister; Heart disease in his sister; Heart failure in his mother; Stroke in his mother.   ROS:   Please see the history of present illness.   All other systems are reviewed and are negative.   PHYSICAL EXAM:   VS:  BP 92/60 (BP Location: Left Arm, Patient Position: Sitting)   Pulse 70   Ht 5' 11 (1.803 m)   Wt 225 lb (102.1 kg)   SpO2 98%   BMI 31.38 kg/m      General: Alert, oriented x3, no distress, mildly obese, healthy pacemaker site Head: no evidence of trauma, PERRL, EOMI, no exophtalmos or lid lag, no myxedema, no xanthelasma; normal ears, nose and oropharynx Neck: Appears to have elevated jugular venous pulsations 7-8 cm and hepatojugular reflux; brisk carotid pulses without delay and no carotid bruits Chest: clear to auscultation, no signs of consolidation by percussion or palpation, normal fremitus, symmetrical and full respiratory excursions Cardiovascular: normal position and quality of the apical impulse, regular rhythm, normal first and paradoxically split second heart sounds, no murmurs, rubs or gallops Abdomen: no tenderness or distention, no masses by palpation, no abnormal pulsatility or arterial bruits, normal bowel sounds, no hepatosplenomegaly Extremities: He has mild pretibial edema, his socks leave rings. Neurological: grossly nonfocal Psych: Normal mood and affect    Wt Readings from Last 3 Encounters:  03/24/24 225 lb (102.1 kg)  09/21/23 219 lb 6.4 oz (99.5 kg)  09/04/23 225 lb (102.1 kg)      Studies/Labs Reviewed:   EKG:  Reviewed the ECG tracing from 08/27/2023 which shows AV sequential pacing with a very broad paced QRS complex with with a duration of  224 ms  EKG Interpretation Date/Time:  Thursday March 24 2024 09:55:50 EDT Ventricular Rate:  70 PR Interval:    QRS Duration:  238 QT Interval:  540 QTC Calculation: 583 R Axis:   -59  Text Interpretation: Atrial sensed/atrial paced?" Ventricular-paced rhythm When compared with ECG of 27-Aug-2023 15:35, Vent. rate has increased BY   9 BPM The atrial mechanism appears to be atrial tachycardia. Confirmed by Dyland Panuco [phone]) on 03/24/2024 3:54:11 PM         ASSESSMENT:    1. Atypical atrial flutter (HCC)   2. Iron deficiency anemia due to chronic blood loss   3. Shortness of breath   4. Chest tightness   5. Chronic combined systolic and diastolic heart failure (HCC)   6. PAF (paroxysmal atrial fibrillation) (HCC)   7. Persistent atrial fibrillation (HCC)   8. Other cardiomyopathy (HCC)   9. Acquired thrombophilia (HCC)   10. Iron deficiency anemia, unspecified iron deficiency anemia type   11. SSS (sick sinus syndrome) (HCC)   12. On amiodarone  therapy   13. CHB (complete heart block) (HCC)   14. Encounter for monitoring amiodarone  therapy   15. Pacemaker   16. OSA (obstructive sleep apnea)       PLAN:  In order of problems listed above:  Atrial flutter: Atypical slow flutter/atrial tachycardia with a cycle length of about 500 ms has been present for the last 2 weeks.  Attempts to overdrive pacing led to deterioration to rapid atrial flutter and eventually atrial fibrillation.  Will increase the amiodarone  back to 400 mg daily and discussed with Dr. Inocencio whether or not another (third) attempt at ablation, this time using pulsed field array would be of any benefit.  History of atypical atrial flutter, likely with a left atrial circuit, which has had both successful and unsuccessful attempts at overdrive pacing in the past, clearly not successful today.   CHA2DS2-VASc 4 (age 81, CHF, hypertension).   No history of embolic events.   CHF: He has symptoms of mild heart  failure exacerbation, but he has proven to have a very narrow margin of compensation, developing symptomatic orthostatic hypotension with excessive diuresis.  He is optimal range generally seems to be in the 215-220 pound weight range, and he is about 6-10 pounds above that today.  His blood pressure is low again today and this precludes other guideline directed medical therapy.  His EF is only mildly decreased, but suspect that the cardiomyopathy is largely due to pacing induced dyssynchrony which is quite prominent on his echocardiogram and is also supported by a very broad paced QRS at over 220 ms.  LVEF remains in the mildly depressed range.  If EF is shown to decline further, he may benefit from resynchronization pacing.  Also discussed this with EP.   CMP: Possible causes for his cardiomyopathy are considered.  His pyrophosphate scan does not support a diagnosis of amyloidosis.Shane [name]  He is having some chest tightness now and I feel compelled to reevaluate for possible development of coronary disease.  However, previous evaluation has not supported this.  He had normal coronary angiograms in 1996 in 2004, on normal nuclear perfusion study in 2012, the low coronary calcium score on a CT performed in 2022.    Anticoagulation: He has not had overt bleeding and his iron deficiency anemia was mild and resolved promptly with iron supplementation.  Denies falls.  He has never had embolic stroke or TIA. Iron deficiency anemia: Has been reluctant to undergo endoscopic evaluation due to previous complications. SSS: Before the onset of the atrial arrhythmias heart rate histogram distribution was favorable. CHB: Interestingly, he has native AV conduction at physiological rates during atrial fibrillation today, after unsuccessful attempts at overdrive pacing.  It is like that he may become again pacemaker dependent when we increase his amiodarone . Amiodarone : Normal liver function tests 02/02/2024.  Normal TSH November  2024 borderline elevated December 2024.  Aware of the possibility of drug interactions and the need to avoid excessive sun exposure.  He should call us  if he has otherwise unexplained respiratory symptoms. Hypotension: Tight margin between orthostatic hypotension and heart failure symptoms.  At this point he does not appear to need midodrine.  Keep weight 216-219 pounds. PM: Normal device function, with extremely broad-based QRS.  May benefit from CRT upgrade at some point. OSA: reports compliance with therapy and denies daytime hypersomnolence.       Medication Adjustments/Labs and Tests Ordered: Current medicines are reviewed at length with the patient today.  Concerns regarding medicines are outlined above.  Medication changes, Labs and Tests ordered today are listed in the Patient Instructions below. Patient Instructions  Medication Instructions:  Increase Amiodarone  to 400 mg daily  *If you need a refill on your cardiac medications before your next appointment, please call your pharmacy*  Lab Work: CMP, TSH, BNP, CBC If you have labs (blood work) drawn today and your tests are completely normal, you will receive your results only by: MyChart Message (if you have MyChart) OR A paper copy in the mail If you have any lab test that is abnormal or we need to change your treatment, we will call you to review the results.  Testing/Procedures: Send in an extra device download in one week     Please report to Radiology at the [facility] Main Entrance 30 minutes early for your test.  [address] [place], [place] [address]     How to Prepare for Your Cardiac PET/CT Stress Test:  Nothing to eat or drink, except water, 3 hours prior to arrival time.  NO caffeine/decaffeinated products, or chocolate 12 hours prior to arrival. (Please note decaffeinated beverages (teas/coffees) still contain caffeine).  If you have caffeine within 12 hours prior, the test will need to be  rescheduled.  Medication instructions: Do not take erectile dysfunction medications for 72 hours prior to test (sildenafil, tadalafil) Do not take nitrates (isosorbide mononitrate, Ranexa) the day before or day of test Do not take tamsulosin  the day before or morning of test Hold theophylline containing medications for 12 hours. Hold Dipyridamole 48 hours prior to the test.  Diabetic Preparation: If able to eat breakfast prior to 3 hour fasting, you may take all medications, including your insulin. Do not worry if you miss your breakfast dose of insulin - start at your next meal. If you do not eat prior to 3 hour fast-Hold all diabetes (oral and insulin) medications. Patients who wear a continuous glucose monitor MUST remove the device prior to scanning.  You may take your remaining medications with water.  NO perfume, cologne or lotion on chest or abdomen area. FEMALES - Please avoid wearing dresses to this appointment.  Total time is 1 to 2 hours; you may want to bring reading material for the waiting time.  IF YOU THINK YOU MAY BE PREGNANT, OR ARE NURSING PLEASE INFORM THE TECHNOLOGIST.  In preparation for your appointment, medication and supplies will be purchased.  Appointment availability is limited, so if you need to cancel or reschedule, please call the Radiology Department Scheduler at [phone] 24 hours in advance to avoid a cancellation fee of $100.00  What to Expect When you Arrive:  Once you arrive and check in for  your appointment, you will be taken to a preparation room within the Radiology Department.  A technologist or Nurse will obtain your medical history, verify that you are correctly prepped for the exam, and explain the procedure.  Afterwards, an IV will be started in your arm and electrodes will be placed on your skin for EKG monitoring during the stress portion of the exam. Then you will be escorted to the PET/CT scanner.  There, staff will get you positioned on  the scanner and obtain a blood pressure and EKG.  During the exam, you will continue to be connected to the EKG and blood pressure machines.  A small, safe amount of a radioactive tracer will be injected in your IV to obtain a series of pictures of your heart along with an injection of a stress agent.    After your Exam:  It is recommended that you eat a meal and drink a caffeinated beverage to counter act any effects of the stress agent.  Drink plenty of fluids for the remainder of the day and urinate frequently for the first couple of hours after the exam.  Your doctor will inform you of your test results within 7-10 business days.  For more information and frequently asked questions, please visit our website: [email or web address]  For questions about your test or how to prepare for your test, please call: Cardiac Imaging Nurse Navigators Office: [phone]   Follow-Up: At [facility], you and your health needs are our priority.  As part of our continuing mission to provide you with exceptional heart care, our providers are all part of one team.  This team includes your primary Cardiologist (physician) and Advanced Practice Providers or APPs (Physician Assistants and Nurse Practitioners) who all work together to provide you with the care you need, when you need it.  Your next appointment:   2 month(s)  Provider:   Jerel Balding, MD    We recommend signing up for the patient portal called MyChart.  Sign up information is provided on this After Visit Summary.  MyChart is used to connect with patients for Virtual Visits (Telemedicine).  Patients are able to view lab/test results, encounter notes, upcoming appointments, etc.  Non-urgent messages can be sent to your provider as well.   To learn more about what you can do with MyChart, go to [email or web address].            Signed, Jerel Balding, MD  03/24/2024 4:30 PM    [facility]  [facility] [address] [place], [place], [place]  [address] Phone: [phone]; Fax: [phone]

## 2024-03-25 ENCOUNTER — Ambulatory Visit: Payer: Self-pay | Admitting: Cardiovascular Disease

## 2024-03-25 LAB — COMPREHENSIVE METABOLIC PANEL WITH GFR
ALT: 20 IU/L (ref 0–44)
AST: 22 IU/L (ref 0–40)
Albumin: 4.1 g/dL (ref 3.8–4.8)
Alkaline Phosphatase: 136 IU/L — ABNORMAL HIGH (ref 44–121)
BUN/Creatinine Ratio: 15 (ref 10–24)
BUN: 18 mg/dL (ref 8–27)
Bilirubin Total: 1 mg/dL (ref 0.0–1.2)
CO2: 27 mmol/L (ref 20–29)
Calcium: 9.6 mg/dL (ref 8.6–10.2)
Chloride: 98 mmol/L (ref 96–106)
Creatinine, Ser: 1.19 mg/dL (ref 0.76–1.27)
Globulin, Total: 3.3 g/dL (ref 1.5–4.5)
Glucose: 78 mg/dL (ref 70–99)
Potassium: 4.2 mmol/L (ref 3.5–5.2)
Sodium: 141 mmol/L (ref 134–144)
Total Protein: 7.4 g/dL (ref 6.0–8.5)
eGFR: 62 mL/min/1.73 (ref >59)

## 2024-03-25 LAB — CBC
Hematocrit: 49.4 % (ref 37.5–51.0)
Hemoglobin: 16 g/dL (ref 13.0–17.7)
MCH: 31.9 pg (ref 26.6–33.0)
MCHC: 32.4 g/dL (ref 31.5–35.7)
MCV: 98 fL — ABNORMAL HIGH (ref 79–97)
Platelets: 252 x10E3/uL (ref 150–450)
RBC: 5.02 x10E6/uL (ref 4.14–5.80)
RDW: 15.1 % (ref 11.6–15.4)
WBC: 5.7 x10E3/uL (ref 3.4–10.8)

## 2024-03-25 LAB — BRAIN NATRIURETIC PEPTIDE: BNP: 477.5 pg/mL — ABNORMAL HIGH (ref 0.0–100.0)

## 2024-03-25 LAB — TSH: TSH: 6.54 u[IU]/mL — ABNORMAL HIGH (ref 0.450–4.500)

## 2024-03-25 NOTE — Telephone Encounter (Signed)
  Pt would like to clarify his medication; amiodarone  - result said do not change medication but Dr. [name] said to increase amiodarone  to 400 mg last visit.

## 2024-04-25 ENCOUNTER — Other Ambulatory Visit: Payer: Self-pay | Admitting: Cardiovascular Disease

## 2024-04-29 NOTE — Telephone Encounter (Signed)
 PA Status Denied  Medication Xyosted 75MG /0.5ML auto-injectors   Action Item - Appeal Info Phone number: [phone] Fax number: [phone] Member ID: [id number] Ref#: [id number]  Reason for Denial at least three alternative formulary medications in the same drug class or category (including the generic equivalent, if available) have not been tried. In this case, only two of the alternative medications have been tried: Testosterone  gel and Testosterone  Cypionate. Alternative medications include (please refer to your plan formulary): Testosterone  enanthate in oil 200 mg/ml. Note: This medication requires prior authorization   Insurance-Mandated Formulary Alternatives Testosterone  enanthate in oil 200 mg/ml    Insurance Company [employer] [place]  Provider Kathline Large, [employer]   Denial letter saved to Media tab

## 2024-05-02 NOTE — Progress Notes (Unsigned)
 Electrophysiology Office Note:   Date:  05/03/2024  ID:  Keona, Sheffler November 29, 1945, MRN [id number]  Primary Cardiologist: Jerel Balding, MD Primary Heart Failure: None Electrophysiologist: Shafter Jupin Gladis Norton, MD      History of Present Illness:   MARIAH HARN is a 78 y.o. male with h/o atrial fibrillation, sick sinus syndrome, sleep apnea seen today for  for Electrophysiology evaluation of atrial fibrillation at the request of Mihai Croitoru.    He has had ablation x 2, most recently 03/01/2021.  He has had continued episodes of atrial fibrillation/atrial tachycardia.  He has felt poor with shortness of breath, fatigue, weakness.  He is in an atrial tachycardia versus atrial flutter today.  He feels fatigued, weak, short of breath.  His blood pressure has been mildly reduced.  For the most part, he is able to do his daily activities, though has to do them more slowly.  Review of systems complete and found to be negative unless listed in HPI.      EP Information / Studies Reviewed:    EKG is not ordered today. EKG from 03/24/2024 reviewed which showed atrial sensed, ventricular paced      PPM Interrogation-  reviewed in detail today,  See PACEART report.  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 The patient's score is based upon: CHF History: 1 HTN History: 0 Diabetes History: 0 Stroke History: 0 Vascular Disease History: 0 Age Score: 2 Gender Score: 0            Physical Exam:   VS:  BP 102/60 (BP Location: Right Arm, Patient Position: Sitting, Cuff Size: Large)   Pulse 64   Ht 5' 11 (1.803 m)   Wt 211 lb (95.7 kg)   SpO2 95%   BMI 29.43 kg/m    Wt Readings from Last 3 Encounters:  05/03/24 211 lb (95.7 kg)  03/24/24 225 lb (102.1 kg)  09/21/23 219 lb 6.4 oz (99.5 kg)     GEN: Well  nourished, well developed in no acute distress NECK: No JVD; No carotid bruits CARDIAC: Regular rate and rhythm, no murmurs, rubs, gallops RESPIRATORY:  Clear to auscultation without rales, wheezing or rhonchi  ABDOMEN: Soft, non-tender, non-distended EXTREMITIES:  No edema; No deformity   ASSESSMENT AND PLAN:    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  Persistent atrial fibrillation/atypical atrial flutter/atrial tachycardia: Post ablation 04/30/2017 with repeat ablation 03/01/2021.  He presented to cardiology clinic in atrial tachycardia with tachycardia cycle length of 500 ms.  Attempts at overdrive pacing resulted in atrial flutter and eventually atrial fibrillation.  He would benefit from rhythm control.  He is already on amiodarone .  Due to that, we Morgan Rennert plan for ablation.  Risk, benefits, and alternatives to EP study and radiofrequency/pulse field ablation for afib were also discussed in detail today. These risks include but are not limited to stroke, bleeding, vascular damage, tamponade, perforation, damage to the esophagus, lungs, and other structures, pulmonary vein stenosis, worsening renal function, and death. The patient understands these risk and wishes to proceed.  We Deloy Archey therefore proceed with catheter ablation at the next available time.  Carto, ICE, anesthesia are requested for the procedure.  This patient Nazim Kadlec require CT prior to ablation. To be scheduled.   3.  Secondary hypercoagulable state: On Eliquis   4.  Obstructive sleep apnea: CPAP compliance encouraged  5.  Chronic systolic heart failure: No obvious volume overload  6.  Obesity: Lifestyle modification encouraged  Disposition:   Follow up with Afib Clinic as usual post procedure  Signed, Tremel Setters Gladis Norton, MD

## 2024-05-03 ENCOUNTER — Ambulatory Visit: Attending: Cardiology | Admitting: Cardiology

## 2024-05-03 ENCOUNTER — Encounter: Payer: Self-pay | Admitting: Cardiology

## 2024-05-03 VITALS — BP 102/60 | HR 64 | Ht 71.0 in | Wt 211.0 lb

## 2024-05-03 DIAGNOSIS — I4819 Other persistent atrial fibrillation: Secondary | ICD-10-CM | POA: Diagnosis not present

## 2024-05-03 DIAGNOSIS — I495 Sick sinus syndrome: Secondary | ICD-10-CM | POA: Diagnosis not present

## 2024-05-03 DIAGNOSIS — D6869 Other thrombophilia: Secondary | ICD-10-CM

## 2024-05-03 DIAGNOSIS — I5022 Chronic systolic (congestive) heart failure: Secondary | ICD-10-CM

## 2024-05-03 DIAGNOSIS — G4733 Obstructive sleep apnea (adult) (pediatric): Secondary | ICD-10-CM

## 2024-05-03 DIAGNOSIS — Z01812 Encounter for preprocedural laboratory examination: Secondary | ICD-10-CM

## 2024-05-03 LAB — CUP PACEART INCLINIC DEVICE CHECK
Date Time Interrogation Session: 20250902134833
Implantable Lead Connection Status: 753985
Implantable Lead Connection Status: 753985
Implantable Lead Implant Date: 20041025
Implantable Lead Implant Date: 20041025
Implantable Lead Location: 753859
Implantable Lead Location: 753860
Implantable Lead Model: 4457
Implantable Lead Model: 4480
Implantable Lead Serial Number: 338209
Implantable Lead Serial Number: 424134
Implantable Pulse Generator Implant Date: 20211206
Lead Channel Impedance Value: 578 Ohm
Lead Channel Impedance Value: 638 Ohm
Lead Channel Pacing Threshold Amplitude: 0.6 V
Lead Channel Pacing Threshold Amplitude: 0.9 V
Lead Channel Pacing Threshold Pulse Width: 0.4 ms
Lead Channel Pacing Threshold Pulse Width: 0.4 ms
Lead Channel Sensing Intrinsic Amplitude: 17.9 mV
Lead Channel Sensing Intrinsic Amplitude: 9.1 mV
Lead Channel Setting Pacing Amplitude: 2.5 V
Lead Channel Setting Pacing Amplitude: 2.5 V
Lead Channel Setting Pacing Pulse Width: 0.4 ms
Lead Channel Setting Sensing Sensitivity: 3 mV
Pulse Gen Serial Number: 955727
Zone Setting Status: 755011

## 2024-05-03 NOTE — Patient Instructions (Addendum)
 Medication Instructions:  Your physician recommends that you continue on your current medications as directed. Please refer to the Current Medication list given to you today.  *If you need a refill on your cardiac medications before your next appointment, please call your pharmacy*   Lab Work: Pre procedure labs -- we will call you to schedule:  BMP & CBC  If you have a lab test that is abnormal and we need to change your treatment, we will call you to review the results -- otherwise no news is good news.    Testing/Procedures: Your physician has requested that you have cardiac CT 3 weeks PRIOR to your ablation. Cardiac computed tomography (CT) is a painless test that uses an x-ray machine to take clear, detailed pictures of your heart. We will contact you if the result is abnormal. We will call you to schedule.  Your physician has recommended that you have a repeat ablation. Catheter ablation is a medical procedure used to treat some cardiac arrhythmias (irregular heartbeats). During catheter ablation, a long, thin, flexible tube is put into a blood vessel in your groin (upper thigh), or neck. This tube is called an ablation catheter. It is then guided to your heart through the blood vessel. Radio frequency waves destroy small areas of heart tissue where abnormal heartbeats may cause an arrhythmia to start.   Your ablation is scheduled for 07/21/2024. Please arrive at [facility] at 5:30 am.  We will call/send instructions at a later date.   Follow-Up: At [facility], you and your health needs are our priority.  As part of our continuing mission to provide you with exceptional heart care, we have created designated Provider Care Teams.  These Care Teams include your primary Cardiologist (physician) and Advanced Practice Providers (APPs -  Physician Assistants and Nurse Practitioners) who all work together to provide you with the care you need, when you need it.  Your next  appointment:   1 month(s) after your ablation  The format for your next appointment:   In Person  Provider:   AFib clinic   Thank you for choosing [facility]!!   Maeola Domino, RN [phone]    Other Instructions   Cardiac Ablation Cardiac ablation is a procedure to destroy (ablate) some heart tissue that is sending bad signals. These bad signals cause problems in heart rhythm. The heart has many areas that make these signals. If there are problems in these areas, they can make the heart beat in a way that is not normal. Destroying some tissues can help make the heart rhythm normal. Tell your doctor about: Any allergies you have. All medicines you are taking. These include vitamins, herbs, eye drops, creams, and over-the-counter medicines. Any problems you or family members have had with medicines that make you fall asleep (anesthetics). Any blood disorders you have. Any surgeries you have had. Any medical conditions you have, such as kidney failure. Whether you are pregnant or may be pregnant. What are the risks? This is a safe procedure. But problems may occur, including: Infection. Bruising and bleeding. Bleeding into the chest. Stroke or blood clots. Damage to nearby areas of your body. Allergies to medicines or dyes. The need for a pacemaker if the normal system is damaged. Failure of the procedure to treat the problem. What happens before the procedure? Medicines Ask your doctor about: Changing or stopping your normal medicines. This is important. Taking aspirin and ibuprofen. Do not take these medicines unless your doctor tells  you to take them. Taking other medicines, vitamins, herbs, and supplements. General instructions Follow instructions from your doctor about what you cannot eat or drink. Plan to have someone take you home from the hospital or clinic. If you will be going home right after the procedure, plan to have someone with you for 24  hours. Ask your doctor what steps will be taken to prevent infection. What happens during the procedure?  An IV tube will be put into one of your veins. You will be given a medicine to help you relax. The skin on your neck or groin will be numbed. A cut (incision) will be made in your neck or groin. A needle will be put through your cut and into a large vein. A tube (catheter) will be put into the needle. The tube will be moved to your heart. Dye may be put through the tube. This helps your doctor see your heart. Small devices (electrodes) on the tube will send out signals. A type of energy will be used to destroy some heart tissue. The tube will be taken out. Pressure will be held on your cut. This helps stop bleeding. A bandage will be put over your cut. The exact procedure may vary among doctors and hospitals. What happens after the procedure? You will be watched until you leave the hospital or clinic. This includes checking your heart rate, breathing rate, oxygen, and blood pressure. Your cut will be watched for bleeding. You will need to lie still for a few hours. Do not drive for 24 hours or as long as your doctor tells you. Summary Cardiac ablation is a procedure to destroy some heart tissue. This is done to treat heart rhythm problems. Tell your doctor about any medical conditions you may have. Tell him or her about all medicines you are taking to treat them. This is a safe procedure. But problems may occur. These include infection, bruising, bleeding, and damage to nearby areas of your body. Follow what your doctor tells you about food and drink. You may also be told to change or stop some of your medicines. After the procedure, do not drive for 24 hours or as long as your doctor tells you. This information is not intended to replace advice given to you by your health care provider. Make sure you discuss any questions you have with your health care provider. Document Revised:  11/08/2021 Document Reviewed: 07/21/2019 Elsevier Patient Education  2023 Elsevier Inc.   Cardiac Ablation, Care After  This sheet gives you information about how to care for yourself after your procedure. Your health care provider may also give you more specific instructions. If you have problems or questions, contact your health care provider. What can I expect after the procedure? After the procedure, it is common to have: Bruising around your puncture site. Tenderness around your puncture site. Skipped heartbeats. If you had an atrial fibrillation ablation, you may have atrial fibrillation during the first several months after your procedure.  Tiredness (fatigue).  Follow these instructions at home: Puncture site care  Follow instructions from your health care provider about how to take care of your puncture site. Make sure you: If present, leave stitches (sutures), skin glue, or adhesive strips in place. These skin closures may need to stay in place for up to 2 weeks. If adhesive strip edges start to loosen and curl up, you may trim the loose edges. Do not remove adhesive strips completely unless your health care provider tells you to  do that. If a large square bandage is present, this may be removed 24 hours after surgery.  Check your puncture site every day for signs of infection. Check for: Redness, swelling, or pain. Fluid or blood. If your puncture site starts to bleed, lie down on your back, apply firm pressure to the area, and contact your health care provider. Warmth. Pus or a bad smell. A pea or small marble sized lump at the site is normal and can take up to three months to resolve.  Driving Do not drive for at least 4 days after your procedure or however long your health care provider recommends. (Do not resume driving if you have previously been instructed not to drive for other health reasons.) Do not drive or use heavy machinery while taking prescription pain  medicine. Activity Avoid activities that take a lot of effort for at least 7 days after your procedure. Do not lift anything that is heavier than 5 lb (4.5 kg) for one week.  No sexual activity for 1 week.  Return to your normal activities as told by your health care provider. Ask your health care provider what activities are safe for you. General instructions Take over-the-counter and prescription medicines only as told by your health care provider. Do not use any products that contain nicotine or tobacco, such as cigarettes and e-cigarettes. If you need help quitting, ask your health care provider. You may shower after 24 hours, but Do not take baths, swim, or use a hot tub for 1 week.  Do not drink alcohol for 24 hours after your procedure. Keep all follow-up visits as told by your health care provider. This is important. Contact a health care provider if: You have redness, mild swelling, or pain around your puncture site. You have fluid or blood coming from your puncture site that stops after applying firm pressure to the area. Your puncture site feels warm to the touch. You have pus or a bad smell coming from your puncture site. You have a fever. You have chest pain or discomfort that spreads to your neck, jaw, or arm. You have chest pain that is worse with lying on your back or taking a deep breath. You are sweating a lot. You feel nauseous. You have a fast or irregular heartbeat. You have shortness of breath. You are dizzy or light-headed and feel the need to lie down. You have pain or numbness in the arm or leg closest to your puncture site. Get help right away if: Your puncture site suddenly swells. Your puncture site is bleeding and the bleeding does not stop after applying firm pressure to the area. These symptoms may represent a serious problem that is an emergency. Do not wait to see if the symptoms will go away. Get medical help right away. Call your local emergency  services (911 in the [place] Do not drive yourself to the hospital. Summary After the procedure, it is normal to have bruising and tenderness at the puncture site in your groin, neck, or forearm. Check your puncture site every day for signs of infection. Get help right away if your puncture site is bleeding and the bleeding does not stop after applying firm pressure to the area. This is a medical emergency. This information is not intended to replace advice given to you by your health care provider. Make sure you discuss any questions you have with your health care provider.

## 2024-05-20 ENCOUNTER — Encounter (HOSPITAL_COMMUNITY): Payer: Self-pay

## 2024-05-23 ENCOUNTER — Telehealth (HOSPITAL_COMMUNITY): Payer: Self-pay | Admitting: *Deleted

## 2024-05-23 ENCOUNTER — Ambulatory Visit: Payer: Self-pay | Admitting: *Deleted

## 2024-05-23 NOTE — Telephone Encounter (Signed)
 No PCP at this time. Recommended UC      FYI Only or Action Required?: FYI only for provider.  Patient was last seen in primary care on na.  Called Nurse Triage reporting Abrasion.  Symptoms began several days ago.  Interventions attempted: Rest, hydration, or home remedies.  Symptoms are: unchanged.  Triage Disposition: See Physician Within 24 Hours  Patient/caregiver understands and will follow disposition?: Yes     Copied from CRM [id number]. Topic: Clinical - Red Word Triage >> May 23, 2024 10:59 AM Nathanel BROCKS wrote: Red Word that prompted transfer to Nurse Triage:   Pt is calling today because he fell on concrete and injury to head (stitches) layer of skin off of arm 10. He is needing to get a follow up appt but he is not an est pt and I can't get him in with an appt.  He is very upset about all of this. Reason for Disposition  No prior tetanus shots (or is not fully vaccinated)  Answer Assessment - Initial Assessment Questions Recommended UC for wound care . New patient appt not scheduled until 06/23/24 and patient previous PCP retired and office is closed. Requesting appt earlier at new practice. Recommended clean left arm abrasion mild soap and water rinse and pat dry. May use OTC antibiotic ointment and may continued non stick dressing. Patient on waitlist for sooner appt  [place]  1. APPEARANCE What does the injury look like?     Wound red not bleeding , skin peeled off from left wrist to elbow from a fall 2. ONSET: How long ago did the [place] occur?      [place] 3. LOCATION: Where is the injury located?      Left arm wrist to elbow 4. SIZE: How large is the cut?      Above wrist to elbow. Skin peeled 5. BLEEDING: Is it bleeding now? If Yes, ask: Is it difficult to stop?      No  6. PAIN: Is there any pain? If Yes, ask: How bad is the pain? (Scale 0-10; or none, mild, moderate, severe)     No pain  reported 7. MECHANISM: Tell me how it happened.      fell 8. TETANUS: When was your last tetanus booster?     No  9. PREGNANCY: Is there any chance you are pregnant? When was your last menstrual period?     na  Protocols used: Skin Injury-A-AH

## 2024-05-23 NOTE — Telephone Encounter (Signed)
 Reaching out to patient to offer assistance regarding upcoming cardiac imaging study; pt verbalizes understanding of appt date/time, parking situation and where to check in, pre-test NPO status and medications ordered, and verified current allergies; name and call back number provided for further questions should they arise Sid Seats RN Navigator Cardiac Imaging [facility] [phone] office [phone] cell  Patient aware to avoid caffeine for 12 hours prior to test and hold flomax .

## 2024-05-24 ENCOUNTER — Ambulatory Visit (HOSPITAL_COMMUNITY)
Admission: EM | Admit: 2024-05-24 | Discharge: 2024-05-24 | Disposition: A | Discharge status: Home / Self Care | Attending: Physician Assistant | Admitting: Physician Assistant

## 2024-05-24 ENCOUNTER — Encounter (HOSPITAL_COMMUNITY): Payer: Self-pay

## 2024-05-24 ENCOUNTER — Ambulatory Visit (INDEPENDENT_AMBULATORY_CARE_PROVIDER_SITE_OTHER)

## 2024-05-24 ENCOUNTER — Encounter (HOSPITAL_COMMUNITY)
Admission: RE | Admit: 2024-05-24 | Discharge: 2024-05-24 | Disposition: A | Discharge status: Home / Self Care | Source: Ambulatory Visit | Attending: Cardiovascular Disease | Admitting: Cardiovascular Disease

## 2024-05-24 DIAGNOSIS — R0781 Pleurodynia: Secondary | ICD-10-CM | POA: Diagnosis not present

## 2024-05-24 DIAGNOSIS — R0789 Other chest pain: Secondary | ICD-10-CM | POA: Insufficient documentation

## 2024-05-24 DIAGNOSIS — Z23 Encounter for immunization: Secondary | ICD-10-CM | POA: Diagnosis not present

## 2024-05-24 DIAGNOSIS — R0602 Shortness of breath: Secondary | ICD-10-CM | POA: Insufficient documentation

## 2024-05-24 DIAGNOSIS — S51812D Laceration without foreign body of left forearm, subsequent encounter: Secondary | ICD-10-CM | POA: Diagnosis not present

## 2024-05-24 DIAGNOSIS — W0110XD Fall on same level from slipping, tripping and stumbling with subsequent striking against unspecified object, subsequent encounter: Secondary | ICD-10-CM | POA: Diagnosis not present

## 2024-05-24 MED ORDER — REGADENOSON 0.4 MG/5ML IV SOLN
0.4000 mg | Freq: Once | INTRAVENOUS | Status: AC
  Administered 2024-05-24: 0.4 mg via INTRAVENOUS

## 2024-05-24 MED ORDER — SILVER SULFADIAZINE 1 % EX CREA
TOPICAL_CREAM | CUTANEOUS | Status: AC
  Filled 2024-05-24: qty 85

## 2024-05-24 MED ORDER — RUBIDIUM RB82 GENERATOR (RUBYFILL)
25.0000 | PACK | Freq: Once | INTRAVENOUS | Status: AC
  Administered 2024-05-24: 24.72 via INTRAVENOUS

## 2024-05-24 MED ORDER — TETANUS-DIPHTH-ACELL PERTUSSIS 5-2.5-18.5 LF-MCG/0.5 IM SUSY
PREFILLED_SYRINGE | INTRAMUSCULAR | Status: AC
  Filled 2024-05-24: qty 0.5

## 2024-05-24 MED ORDER — RUBIDIUM RB82 GENERATOR (RUBYFILL)
25.0000 | PACK | Freq: Once | INTRAVENOUS | Status: AC
  Administered 2024-05-24: 24.76 via INTRAVENOUS

## 2024-05-24 MED ORDER — REGADENOSON 0.4 MG/5ML IV SOLN
INTRAVENOUS | Status: AC
  Filled 2024-05-24: qty 5

## 2024-05-24 MED ORDER — TETANUS-DIPHTH-ACELL PERTUSSIS 5-2.5-18.5 LF-MCG/0.5 IM SUSY
0.5000 mL | PREFILLED_SYRINGE | Freq: Once | INTRAMUSCULAR | Status: AC
  Administered 2024-05-24: 0.5 mL via INTRAMUSCULAR

## 2024-05-24 NOTE — Discharge Instructions (Addendum)
 You were seen today for concerns of left-sided rib pain following a fall as well as a skin tear on your left forearm. To help with your skin tear I recommend changing your bandage at least once per day.  To help prevent sticking of the bandages I recommend soaking it in warm water and removing the bandages while still wet.  After this you can shower and wash the area with warm water and gentle soap and then pat to dry.  I recommend applying the Silvadene  cream on top of the skin tear wound and then applying a nonstick bandages to that to help prevent contamination or discomfort.  If you notice any of the following symptoms please return to urgent care for further evaluation: Excessive bleeding, drainage that looks like pus, severe pain, swelling of the forearm, redness that is extending from the wound edges, Radiology has reviewed your x-ray and they do not see any evidence of an acute fracture or dislocation of your ribs.

## 2024-05-24 NOTE — ED Provider Notes (Signed)
 [facility]    CSN: [id number] Arrival date & time: 05/24/24  1314      History   Chief Complaint Chief Complaint  Patient presents with   Fall    HPI Shane [name] is a 78 y.o. male.  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   HPI Discussed the use of AI scribe software for clinical note transcription with the patient, who gave verbal consent to proceed.  He presents with left-sided pain and shortness of breath following a fall on concrete. He is accompanied by his wife, who assists with his care.  Last Thursday, he [place], resulting in a head injury and a significant abrasion on his arm where the skin was removed. He has been experiencing persistent left-sided rib pain and shortness of breath since the fall. The pain is severe, especially when attempting to take a deep breath, and occasionally intensifies unexpectedly.  He visited the emergency room at [facility], where he was evaluated. He has been keeping the wound clean with soap and water but is concerned about the wound's appearance and the pain associated with dressing changes. The nonstick pads recommended are not effective and cause significant discomfort.  He had a PET scan for his heart earlier today and is concerned about the impact of his injuries on his heart condition. He has not had a tetanus shot in years and was not given one at the emergency room despite the open wound from the fall.  No known allergies. He reports shortness of breath and severe pain on the left side, especially when attempting to take a deep breath.    Past Medical History:  Diagnosis Date   Atrioventricular block    Degenerative joint disease    Gout    Paroxysmal atrial fibrillation (HCC)     Presence of permanent cardiac pacemaker 03/12/10   guidant  [facility] [phone])   Sinus node dysfunction (HCC)    Sleep apnea    CPAP   Wears hearing aid in both ears     Patient Active Problem List   Diagnosis Date Noted   CHB (complete heart block) (HCC) 08/03/2022   Secondary hypercoagulable state 04/29/2021   Tachycardia-bradycardia syndrome (HCC)    Idiopathic chronic gout of multiple sites without tophus 06/29/2018   History of rotator cuff tear 05/11/2018   Primary osteoarthritis of right knee 05/11/2018   Benign prostatic hyperplasia with urinary obstruction 11/13/2017   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   Elevated prostate specific antigen (PSA) 11/10/2017   Atypical atrial flutter (HCC) 04/22/2017   Status post catheter ablation of atrial fibrillation 04/04/2017   Hypotension due to drugs 09/18/2015   OSA (obstructive sleep apnea) 03/30/2014   Persistent atrial fibrillation (HCC) 04/05/2013   Chronic combined systolic and diastolic heart failure (HCC) 04/05/2013   Obesity, morbid (more than 100 lbs over ideal weight or BMI > 40) (HCC) 04/05/2013   Morbid obesity (HCC) 04/05/2013   Presence of cardiac pacemaker 04/05/2013   Atrial fibrillation (HCC) 04/05/2013    Past Surgical History:  Procedure Laterality Date   ATRIAL FIBRILLATION ABLATION N/A 04/03/2017   Procedure: Atrial Fibrillation Ablation;  Surgeon: Inocencio Soyla Lunger, MD;  Location: [facility] INVASIVE CV LAB;  Service: Cardiovascular;  Laterality: N/A;   ATRIAL FIBRILLATION ABLATION N/A 03/01/2021   Procedure: ATRIAL  FIBRILLATION ABLATION;  Surgeon: Inocencio Soyla Lunger, MD;  Location: [facility] INVASIVE CV LAB;  Service: Cardiovascular;  Laterality: N/A;   CARDIAC CATHETERIZATION  10/13/1994   No CAD   CARDIAC CATHETERIZATION  05/10/2003   No CAD   CARDIOVERSION N/A 10/21/2016   Procedure: CARDIOVERSION;  Surgeon: Jerel Balding, MD;  Location: [facility] ENDOSCOPY;  Service: Cardiovascular;  Laterality: N/A;   CARDIOVERSION N/A  05/15/2017   Procedure: CARDIOVERSION;  Surgeon: Okey Vina GAILS, MD;  Location: [facility] ENDOSCOPY;  Service: Cardiovascular;  Laterality: N/A;   CARDIOVERSION N/A 10/21/2019   Procedure: CARDIOVERSION;  Surgeon: Balding Jerel, MD;  Location: [facility] ENDOSCOPY;  Service: Cardiovascular;  Laterality: N/A;   CARDIOVERSION N/A 02/24/2020   Procedure: CARDIOVERSION;  Surgeon: Pietro Redell RAMAN, MD;  Location: [facility] ENDOSCOPY;  Service: Cardiovascular;  Laterality: N/A;   CARDIOVERSION N/A 04/11/2020   Procedure: CARDIOVERSION;  Surgeon: Raford Riggs, MD;  Location: [facility] ENDOSCOPY;  Service: Cardiovascular;  Laterality: N/A;   CARDIOVERSION N/A 12/26/2020   Procedure: CARDIOVERSION;  Surgeon: Balding Jerel, MD;  Location: [facility] ENDOSCOPY;  Service: Cardiovascular;  Laterality: N/A;   CATARACT EXTRACTION W/PHACO Left 05/18/2019   Procedure: CATARACT EXTRACTION PHACO AND INTRAOCULAR LENS PLACEMENT (IOC) LEFT  1:21 11.0% 9.01;  Surgeon: Mittie Gaskin, MD;  Location: [facility] SURGERY CNTR;  Service: Ophthalmology;  Laterality: Left;  sleep apnea   CATARACT EXTRACTION W/PHACO Right 06/29/2019   Procedure: CATARACT EXTRACTION PHACO AND INTRAOCULAR LENS PLACEMENT (IOC) RIGHT  01:14.2  14.5%  10.92;  Surgeon: Mittie Gaskin, MD;  Location: [facility] SURGERY CNTR;  Service: Ophthalmology;  Laterality: Right;  sleep apnea   CYST EXCISION  03/2019   on back    NM MYOCAR PERF WALL MOTION  05/07/2011   Lexiscan : No ishcemia   PERMANENT PACEMAKER INSERTION  03/12/2010   guidant   PPM GENERATOR CHANGEOUT N/A 08/06/2020   Procedure: PPM GENERATOR CHANGEOUT;  Surgeon: Balding Jerel, MD;  Location: [facility] INVASIVE CV LAB;  Service: Cardiovascular;  Laterality: N/A;   ROTATOR CUFF REPAIR  1996   SHOULDER ARTHROSCOPY     TEE WITHOUT CARDIOVERSION N/A 03/01/2021   Procedure: TRANSESOPHAGEAL ECHOCARDIOGRAM (TEE);  Surgeon: Inocencio Soyla Lunger, MD;  Location: [facility] INVASIVE CV LAB;  Service: Cardiovascular;  Laterality: N/A;   US   ECHOCARDIOGRAPHY  05/07/2011   mod. LVH,LA mod. dilated,borderline aortic root dilatation       Home Medications    Prior to Admission medications   Medication Sig Start Date End Date Taking? Authorizing Provider  allopurinol  (ZYLOPRIM ) 300 MG tablet Take 1 tablet (300 mg total) by mouth daily. 10/05/19   Dolphus Reiter, MD  amiodarone  (PACERONE ) 400 MG tablet Take 1 tablet (400 mg total) by mouth daily. 03/24/24   Croitoru, Mihai, MD  apixaban  (ELIQUIS ) 5 MG TABS tablet Take 1 tablet (5 mg total) by mouth 2 (two) times daily. 02/15/24   Croitoru, Mihai, MD  Apoaequorin (PREVAGEN PO) Take 1 tablet by mouth daily.    [provider]  Ascorbic Acid  (VITAMIN C ) 1000 MG tablet Take 1,000 mg by mouth daily.    [provider]  cetirizine (ZYRTEC) 10 MG tablet Take 10 mg by mouth. 02/03/24 02/02/25  [provider]  Cholecalciferol  (VITAMIN D -3 PO) Take 2,500 Units by mouth at bedtime.    [provider]  citalopram  (CELEXA ) 40 MG tablet Take 1 tablet (40 mg total) by mouth daily. MUST RECEIVE FUTURE REFILLS FROM PCP. 07/11/13   Croitoru, Mihai, MD  colchicine  0.6 MG tablet Take 0.5 tablets (0.3 mg total)  by mouth daily as needed. Patient not taking: Reported on 05/03/2024 10/04/19   Dolphus Reiter, MD  doxycycline (VIBRAMYCIN) 100 MG capsule Take 100 mg by mouth 2 (two) times daily. Patient not taking: Reported on 05/03/2024 03/01/24   [provider]  empagliflozin  (JARDIANCE ) 10 MG TABS tablet Take 1 tablet (10 mg total) by mouth daily before breakfast. 07/21/23   Croitoru, Mihai, MD  erythromycin ophthalmic ointment Place 1 Application into both eyes as needed. Patient not taking: Reported on 05/03/2024 08/14/22   [provider]  ferrous sulfate  325 (65 FE) MG EC tablet Take 1 tablet (325 mg total) by mouth 2 (two) times daily. Patient taking differently: Take 325 mg by mouth daily at 6 (six) AM. 09/21/23   Croitoru, Jerel, MD  furosemide  (LASIX ) 40  MG tablet Take 1 tablet (40 mg total) by mouth daily. 02/25/24 05/25/24  Croitoru, Mihai, MD  furosemide  (LASIX ) 80 MG tablet Take 1 tablet (80 mg total) by mouth daily. 09/04/23 05/03/24  Meng, Hao, PA  montelukast  (SINGULAIR ) 10 MG tablet Take 10 mg by mouth at bedtime. 10/27/20   [provider]  Multiple Vitamin (MULTIVITAMIN WITH MINERALS) TABS tablet Take 1 tablet by mouth daily. Senior    [provider]  Omega-3 Fatty Acids (FISH OIL) 1200 MG CAPS Take 1,200 mg by mouth 2 (two) times daily.     [provider]  potassium chloride  SA (KLOR-CON  M) 20 MEQ tablet Take 1 tablet (20 mEq total) by mouth daily. 04/25/24   Croitoru, Mihai, MD  pyridOXINE (VITAMIN B-6) 100 MG tablet Take 100 mg by mouth daily.    [provider]  tamsulosin  (FLOMAX ) 0.4 MG CAPS capsule Take 1 capsule (0.4 mg total) by mouth daily. 09/21/23   Croitoru, Mihai, MD  testosterone  cypionate (DEPOTESTOSTERONE CYPIONATE) 200 MG/ML injection Inject 200 mg into the muscle once a week.    [provider]  vitamin B-12 (CYANOCOBALAMIN ) 1000 MCG tablet Take 1,000 mcg by mouth daily.    [provider]  Wheat Dextrin (BENEFIBER DRINK MIX PO) Take 30 mLs by mouth in the morning and at bedtime. 2 tablespoons a day    [provider]  zinc gluconate 50 MG tablet Take 50 mg by mouth daily.    [provider]    Family History Family History  Problem Relation Age of Onset   Stroke Mother    Heart failure Mother    Alzheimer's disease Father    Heart disease Sister    Dementia Sister     Social History Social History   Tobacco Use   Smoking status: Former    Types: Cigars    Quit date: 05/11/1993    Years since quitting: 31.0   Smokeless tobacco: Former    Types: Chew    Quit date: 05/11/1993   Tobacco comments:    Former smoker 04/29/21  Vaping Use   Vaping status: Never Used  Substance Use Topics   Alcohol use: Not Currently    Comment: social   Drug  use: No     Allergies   Patient has no known allergies.   Review of Systems Review of Systems  Respiratory:  Negative for shortness of breath.   Musculoskeletal:        Left side pain along the lower ribs       Physical Exam Triage Vital Signs ED Triage Vitals  Encounter Vitals Group     BP 05/24/24 1423 118/68     Girls  Systolic BP Percentile --      Girls Diastolic BP Percentile --      Boys Systolic BP Percentile --      Boys Diastolic BP Percentile --      Pulse Rate 05/24/24 1423 66     Resp 05/24/24 1423 18     Temp 05/24/24 1423 98.5 F (36.9 C)     Temp Source 05/24/24 1423 Oral     SpO2 05/24/24 1423 92 %     Weight --      Height --      Head Circumference --      Peak Flow --      Pain Score 05/24/24 1420 6     Pain Loc --      Pain Education --      Exclude from Growth Chart --    No data found.  Updated Vital Signs BP 118/68 (BP Location: Left Arm)   Pulse 66   Temp 98.5 F (36.9 C) (Oral)   Resp 18   SpO2 92%   Visual Acuity Right Eye Distance:   Left Eye Distance:   Bilateral Distance:    Right Eye Near:   Left Eye Near:    Bilateral Near:     Physical Exam Vitals reviewed.  Constitutional:      General: He is awake.     Appearance: Normal appearance. He is well-developed and well-groomed.  HENT:     Head: Normocephalic and atraumatic.  Eyes:     Extraocular Movements: Extraocular movements intact.     Conjunctiva/sclera: Conjunctivae normal.  Pulmonary:     Effort: Pulmonary effort is normal.  Abdominal:   Musculoskeletal:     Cervical back: Normal range of motion.  Skin:     Neurological:     Mental Status: He is alert and oriented to person, place, and time.  Psychiatric:        Attention and Perception: Attention normal.        Mood and Affect: Mood normal.        Speech: Speech normal.        Behavior: Behavior normal. Behavior is cooperative.      Media Information   Document Information  Photos  Left  forearm skin tear  05/24/2024 15:38  Attached To:  Hospital Encounter on 05/24/24  Source Information  Janice Seales, Rocky BRAVO, PA-C  [facility]    UC Treatments / Results  Labs (all labs ordered are listed, but only abnormal results are displayed) Labs Reviewed - No data to display  EKG   Radiology DG Ribs Unilateral W/Chest Left Result Date: 05/24/2024 CLINICAL DATA:  left sided chest pain after fall EXAM: LEFT RIBS AND CHEST - 3+ VIEW COMPARISON:  Chest XR, 01/29/2023. CT chest, 02/22/2021 and 03/31/2017. FINDINGS: Support lines; LEFT subclavian-approach pacer with intact dual leads Cardiomediastinal silhouette is within normal limits. Lungs are well inflated. No focal consolidation or mass. No pleural effusion or pneumothorax. No acute displaced fracture. No fracture or other bone lesions are seen involving the ribs. IMPRESSION: 1. No acute cardiopulmonary process. 2. No acute displaced fracture. Electronically Signed   By: Thom Hall M.D.   On: 05/24/2024 16:25   NM PET CT CARDIAC PERFUSION MULTI W/ABSOLUTE BLOODFLOW Result Date: 05/24/2024 EXAM: OVER-READ INTERPRETATION  CT CHEST The following report is a limited chest CT over-read performed by radiologist Dr. Elsie Ko [facility] on 05/24/2024. This over-read does not include interpretation of cardiac or coronary anatomy or  pathology nor does it include evaluation of the PET data. The chronic PET-CT interpretation by the cardiologist is attached. COMPARISON:  Cardiac CT 02/22/2021 and 03/31/2017. Chest radiographs 01/19/2023. FINDINGS: Mediastinum/Nodes: Mildly prominent right paratracheal lymph nodes, not previously imaged, likely reactive. Left subclavian pacemaker leads and mild cardiac enlargement are noted. Lungs/Pleura: No pleural effusion or pneumothorax. Pulmonary assessment limited by breathing artifact. Lower lung volumes with central airway thickening and new patchy ground-glass opacities dependently in  both lower lobes, likely atelectasis or inflammation. There are some ill-defined nodular components in the right lower lobe which are nonspecific, measuring up to 1.1 cm on image 25/4 and 0.6 cm on image 27/4. Upper abdomen: No significant findings in the visualized upper abdomen. Musculoskeletal/Chest wall: No chest wall mass or suspicious osseous findings within the visualized chest. IMPRESSION: 1. Low lung volumes with central airway thickening and patchy ground-glass opacities dependently in both lower lobes, likely atelectasis or inflammation. 2. Ill-defined nodular components in the right lower lobe are nonspecific, but could be inflammatory. Follow-up noncontrast chest CT in 3 months recommended to ensure resolution. 3. Mildly prominent right paratracheal lymph nodes, likely reactive. Electronically Signed   By: Elsie Perone M.D.   On: 05/24/2024 12:49    Procedures Procedures (including critical care time)  Medications Ordered in UC Medications  Tdap (BOOSTRIX ) injection 0.5 mL (0.5 mLs Intramuscular Given 05/24/24 1612)    Initial Impression / Assessment and Plan / UC Course  I have reviewed the triage vital signs and the nursing notes.  Pertinent labs & imaging results that were available during my care of the patient were reviewed by me and considered in my medical decision making (see chart for details).      Final Clinical Impressions(s) / UC Diagnoses   Final diagnoses:  Rib pain on left side  Skin tear of forearm without complication, left, subsequent encounter   Patient presents today with concerns for a skin tear to the left forearm following a fall about 5 days ago.  He was seen at [facility] emergency room for evaluation immediately after the fall.  CT head, chest, cervical spine were completed without evidence of acute abnormality.  He reports that he has had difficulty managing the skin tear due to bandages sticking to the wound and reopening it.  Reviewed that he  should keep the wound bed moistened and will supply him with Silvadene  cream to take home.  Reviewed wound care measures as well as appropriate bandage changing.  Recommend soaking the arm in warm water to help loosen up the pain edges prior to removal and cleansing.  Recommend cleansing with warm water and gentle soap and then patting to try and then reapplying Silvadene  cream along with nonstick gauze.  Reviewed signs and symptoms of skin infection as well as worsening wound status which would require further intervention.  Will get left rib imaging today to assess for potential fracture given the fact that he is having pain with deep inspiration.  Chest x-ray was negative for acute fracture as was CT chest on 05/19/2024.  Reviewed importance of taking deep breaths several times per hour to prevent atelectasis.  At this time suspect likely costochondritis or other soft tissue injury causing discomfort.  Recommend Tylenol  as needed for pain control.  Recommend follow-up with PCP if symptoms or not improving or if they seem to be worsening or return urgent care for follow-up.     Discharge Instructions      You were seen today for concerns  of left-sided rib pain following a fall as well as a skin tear on your left forearm. To help with your skin tear I recommend changing your bandage at least once per day.  To help prevent sticking of the bandages I recommend soaking it in warm water and removing the bandages while still wet.  After this you can shower and wash the area with warm water and gentle soap and then pat to dry.  I recommend applying the Silvadene  cream on top of the skin tear wound and then applying a nonstick bandages to that to help prevent contamination or discomfort.  If you notice any of the following symptoms please return to urgent care for further evaluation: Excessive bleeding, drainage that looks like pus, severe pain, swelling of the forearm, redness that is extending from the wound  edges, Radiology has reviewed your x-ray and they do not see any evidence of an acute fracture or dislocation of your ribs.        ED Prescriptions   None    PDMP not reviewed this encounter.   Marylene Rocky BRAVO, PA-C 05/24/24 [id number]

## 2024-05-24 NOTE — Progress Notes (Signed)
 Pt tolerated lexiscan . Minor SOB endorsed, resolved before end of scan.

## 2024-05-24 NOTE — ED Triage Notes (Signed)
 Pt states tripped and fell last Thursday [place]. States hit his head and on blood thinner so was seen at ED. States has a large skin tear to lt forearm and states its painful and not getting any better. States having rt flank pain and hurts to take a deep breath. States his tetanus is out of take.

## 2024-05-25 LAB — NM PET CT CARDIAC PERFUSION MULTI W/ABSOLUTE BLOODFLOW
MBFR: 2.63
Nuc Rest EF: 44 %
Nuc Stress EF: 47 %
Rest MBF: 0.71 ml/g/min
Rest Nuclear Isotope Dose: 24.7 mCi
Stress MBF: 1.87 ml/g/min
Stress Nuclear Isotope Dose: 24.8 mCi
TID: 1.13

## 2024-06-02 ENCOUNTER — Encounter: Payer: Self-pay | Admitting: Cardiovascular Disease

## 2024-06-02 ENCOUNTER — Ambulatory Visit: Attending: Cardiovascular Disease | Admitting: Cardiovascular Disease

## 2024-06-02 VITALS — BP 100/62 | HR 67 | Ht 71.0 in | Wt 218.6 lb

## 2024-06-02 DIAGNOSIS — I5042 Chronic combined systolic (congestive) and diastolic (congestive) heart failure: Secondary | ICD-10-CM

## 2024-06-02 DIAGNOSIS — Z5181 Encounter for therapeutic drug level monitoring: Secondary | ICD-10-CM

## 2024-06-02 DIAGNOSIS — D500 Iron deficiency anemia secondary to blood loss (chronic): Secondary | ICD-10-CM

## 2024-06-02 DIAGNOSIS — I484 Atypical atrial flutter: Secondary | ICD-10-CM

## 2024-06-02 DIAGNOSIS — I428 Other cardiomyopathies: Secondary | ICD-10-CM | POA: Diagnosis not present

## 2024-06-02 DIAGNOSIS — Z01818 Encounter for other preprocedural examination: Secondary | ICD-10-CM

## 2024-06-02 DIAGNOSIS — D6869 Other thrombophilia: Secondary | ICD-10-CM

## 2024-06-02 DIAGNOSIS — I951 Orthostatic hypotension: Secondary | ICD-10-CM

## 2024-06-02 DIAGNOSIS — I495 Sick sinus syndrome: Secondary | ICD-10-CM

## 2024-06-02 DIAGNOSIS — G4733 Obstructive sleep apnea (adult) (pediatric): Secondary | ICD-10-CM

## 2024-06-02 DIAGNOSIS — Z79899 Other long term (current) drug therapy: Secondary | ICD-10-CM

## 2024-06-02 DIAGNOSIS — I442 Atrioventricular block, complete: Secondary | ICD-10-CM

## 2024-06-02 DIAGNOSIS — Z950 Presence of cardiac pacemaker: Secondary | ICD-10-CM

## 2024-06-02 NOTE — Patient Instructions (Signed)
 Medication Instructions:  No changes  *If you need a refill on your cardiac medications before your next appointment, please call your pharmacy*   Lab Work: CBC BMP If you have labs (blood work) drawn today and your tests are completely normal, you will receive your results only by: MyChart Message (if you have MyChart) OR A paper copy in the mail If you have any lab test that is abnormal or we need to change your treatment, we will call you to review the results.   Testing/Procedures: Schedule  for Oct 13 , 2025 at 1 pm Your physician has recommended that you have a Cardioversion (DCCV). Electrical Cardioversion uses a jolt of electricity to your heart either through paddles or wired patches attached to your chest. This is a controlled, usually prescheduled, procedure. Defibrillation is done under light anesthesia in the hospital, and you usually go home the day of the procedure. This is done to get your heart back into a normal rhythm. You are not awake for the procedure. Please see the instruction sheet given to you today.   Follow-Up: At [facility], you and your health needs are our priority.  As part of our continuing mission to provide you with exceptional heart care, we have created designated Provider Care Teams.  These Care Teams include your primary Cardiologist (physician) and Advanced Practice Providers (APPs -  Physician Assistants and Nurse Practitioners) who all work together to provide you with the care you need, when you need it.     Your next appointment:   6 month(s)  The format for your next appointment:   In Person  Provider:   Jerel Balding, MD   Other Instructions      Dear Shane [name]  You are scheduled for a Cardioversion on Monday, October 13 with Dr. Balding.  Please arrive at the [facility] (Main Entrance A) at [facility]: [address] [place], [place] [address] at 12:00 PM (This time is 1 hour(s) before your procedure to ensure  your preparation).   Free valet parking service is available. You will check in at ADMITTING.   *Please Note: You will receive a call the day before your procedure to confirm the appointment time. That time may have changed from the original time based on the schedule for that day.*    DIET:  Nothing to eat or drink after midnight except a sip of water with medications (see medication instructions below)  MEDICATION INSTRUCTIONS: !!IF ANY NEW MEDICATIONS ARE STARTED AFTER TODAY, PLEASE NOTIFY YOUR PROVIDER AS SOON AS POSSIBLE!!  FYI: Medications such as Semaglutide (Ozempic, [place]), Tirzepatide (Mounjaro, Zepbound), Dulaglutide (Trulicity), etc (GLP1 agonists) AND Canagliflozin (Invokana), Dapagliflozin (Farxiga), Empagliflozin  (Jardiance ), Ertugliflozin (Steglatro), Bexagliflozin Arboriculturist) or any combination with one of these drugs such as Invokamet (Canagliflozin/Metformin), Synjardy (Empagliflozin /Metformin), etc (SGLT2 inhibitors) must be held around the time of a procedure. This is not a comprehensive list of all of these drugs. Please review all of your medications and talk to your provider if you take any one of these. If you are not sure, ask your provider.  Jardiance  HOLD: Empagliflozin  (Jardiance ) for 3 days prior to the procedure. Last dose on Thursday, October 09.  Continue taking your anticoagulant (blood thinner): Apixaban  (Eliquis ).  You will need to continue this after your procedure until you are told by your provider that it is safe to stop.    LABS: CBC,BMP Come to the lab at the [facility] (  [address] [place], 1st Floor) between the hours of 8:00 am and 4:30 pm. You do NOT have to be fasting.  FYI:  For your safety, and to allow us  to monitor your vital signs accurately during the surgery/procedure we request: If you have artificial nails, gel coating, SNS etc, please have those removed prior to your  surgery/procedure. Not having the nail coverings /polish removed may result in cancellation or delay of your surgery/procedure.  Your support person will be asked to wait in the waiting room during your procedure.  It is OK to have someone drop you off and come back when you are ready to be discharged.  You cannot drive after the procedure and will need someone to drive you home.  Bring your insurance cards.  *Special Note: Every effort is made to have your procedure done on time. Occasionally there are emergencies that occur at the hospital that may cause delays. Please be patient if a delay does occur.

## 2024-06-02 NOTE — Progress Notes (Signed)
 Patient ID: Shane [name], male   DOB: 23-Jan-1946, 78 y.o.   MRN: [id number]     Cardiology Office Note    Date:  06/12/2024   ID:  Demarques, Pilz 02/26/1946, MRN [id number]  PCP:  Corlis Honor BROCKS, MD  Cardiologist:   Jerel Balding, MD   No chief complaint on file.   History of Present Illness:  Shane [name] is a 78 y.o. male with sinus node dysfunction, high-grade AV block, recurrent persistent atrial flutter and atrial fibrillation (endocardial ablation x 2 , most recently 03/01/2021), chronic heart failure (mildly depressed LVEF), obstructive sleep apnea.  He underwent pacemaker change out in 2021 [facility] [employer] Accolade dual-chamber device).  He had a recent mechanical fall and his ribs feel a little tender, but he did not have any serious injuries.  Had a negative head CT due to head impact.  He has been feeling short of breath.  NYHA functional class II-III.  He has problems with his CPAP mask.  He does not have lower extremity edema and denies chest pain or syncope.  Blood pressure remains borderline low at 100/62 today.  Despite increasing his dose of amiodarone  to 400 mg daily, interrogation of his pacemaker shows that he is in a rather slow atrial flutter/atrial tachycardia at 135 bpm.  Each alternative atrial beat falls in the refractory period so he is demand pacing in the ventricle at about 70 bpm.  Today, attempts to overdrive pace this tachycardia promptly led to a change in the flutter cycle length leading to an atrial rate of 252 bpm and mode switch.  The ventricular rate remained unchanged in the 70s.  As before , he has no palpitation awareness.  He is scheduled for ablation on November 20 with Dr. Inocencio.  He is not taking any medications with direct effect on blood pressure, but does take Jardiance  for heart failure and tamsulosin  for prostatism.  Interrogation of his pacemaker shows that for the last roughly 12 days he has had persistent atrial mode switch.   Senting rhythm is atrial tachycardia/slow atrial flutter with a cycle length at about 540 ms.  We tried to perform overdrive pacing in the arrhythmia immediately switched to rapid atrial flutter with a cycle length of around 250 ms.  Additional attempts at overdrive pacing with a gradually shorter cycle lengths eventually led to deterioration to atrial fibrillation.  During these changes in rhythm there was really no change in his symptoms and the ventricular rate remained around 60 bpm.  Otherwise pacemaker function is normal [employer] Accolade implanted as a generator change out in 2021, atrial lead 480 fine-line, ventricular lead 4575 fine-line).  When he is not in the atrial arrhythmia he has virtually 100% AV sequential paced.  He has not had any episodes of high ventricular rate.  He is not entirely device dependent (although he has 100% ventricular pacing).  Anemia has resolved completely and his hemoglobin is actually high at 17.2.  His most recent TSH 03/24/2024 ago was borderline high at 6.540, on the same date he had normal liver function tests except for pre-existing mild elevation in the alkaline phosphatase.   He has had extensive workup for structural heart disease: -  echocardiogram performed /09/07/2023 that shows a slight reduction in LVEF at 40-45%.  Previous echo in May 2024 estimated EF to be 50-55%.  When reviewing the images side-by-side, there is not really that big of a difference.  There does seem to be fairly profound  pacing related dyssynchrony (both apex to base and septum to lateral wall) on both studies.  His echoes have generally shown evidence of mild or moderate LVH and reduced global longitudinal strain with a severely dilated left atrium.  He has never had any serious valvular abnormalities. - Technetium 69m pyrophosphate scan was negative for cardiac amyloidosis, in my opinion.  Heart-lung ratio was 1.0. - Previous remote cardiac catheterizations performed in  1996 and in 2004 showed no evidence of coronary artery disease.   - There was no evidence of ischemic perfusion abnormalities on a nuclear stress test in 2012.   - On a cardiac CT performed in 2022 for atrial fibrillation ablation the calcium score was only 99, corresponding to 32nd percentile for age and gender.  He has previous undergone 2 ablation procedures for atrial arrhythmia: - RF ablation August 2018.  Required cardioversion for recurrent atrial fibrillation in February 2021 and June 2021 and August 2021 with successful maintenance of sinus rhythm only after addition of amiodarone .  Again required cardioversion for symptomatic atrial fibrillation with difficult to control rates in March 2022 - Had repeat ablation in July 2022 with Dr. Inocencio.  Returned with atrial flutter with controlled ventricular rate and amiodarone  dose was increased to 400 mg daily in August 2022.  Had successful overdrive pacing.   He has a history of tachycardia-bradycardia syndrome, chronic diastolic heart failure, morbid obesity, obstructive sleep apnea on CPAP.  He underwent radiofrequency ablation for atrial fibrillation in August 2018 with a recurrent atrial fibrillation requiring cardioversion in February 2021 and June 2021, then started on amiodarone  and underwent successful cardioversion with lasting benefit on April 11, 2020.  He underwent pacemaker generator change out last December [facility] [employer] Accolade, August 06, 2020).  He had recurrent atrial fibrillation in March 2022, symptomatic with difficult rate control.  Amiodarone  was initiated.  He had repeat ablation performed in July 2022.  He again returned with atrial flutter, this time well rate controlled.  The amiodarone  dose was increased to 200 mg twice daily in mid August.  Overdrive pacing was unsuccessful in the office, but was successful when he presented for cardioversion later the same month.  He has maintained normal rhythm since that time.   Amiodarone  has subsequently been reduced to 200 mg once daily.  However, in 2025 his atrial for recurrent and amiodarone  was again increased to 400 mg daily.   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   guidant  [facility] [employer] [phone])   Sinus node dysfunction (HCC)    Sleep apnea    CPAP   Wears hearing aid in both ears     Past Surgical History:  Procedure Laterality Date   ATRIAL FIBRILLATION ABLATION N/A 04/03/2017   Procedure: Atrial Fibrillation Ablation;  Surgeon: Inocencio Soyla Lunger, MD;  Location: [facility] INVASIVE CV LAB;  Service: Cardiovascular;  Laterality: N/A;   ATRIAL FIBRILLATION ABLATION N/A 03/01/2021   Procedure: ATRIAL FIBRILLATION ABLATION;  Surgeon: Inocencio Soyla Lunger, MD;  Location: [facility] INVASIVE CV LAB;  Service: Cardiovascular;  Laterality: N/A;   CARDIAC CATHETERIZATION  10/13/1994   No CAD   CARDIAC CATHETERIZATION  05/10/2003   No CAD   CARDIOVERSION N/A 10/21/2016   Procedure: CARDIOVERSION;  Surgeon: Jerel Balding, MD;  Location: [facility] ENDOSCOPY;  Service: Cardiovascular;  Laterality: N/A;   CARDIOVERSION N/A 05/15/2017  Procedure: CARDIOVERSION;  Surgeon: Okey Vina GAILS, MD;  Location: [facility] ENDOSCOPY;  Service: Cardiovascular;  Laterality: N/A;   CARDIOVERSION N/A 10/21/2019   Procedure: CARDIOVERSION;  Surgeon: Francyne Headland, MD;  Location: [facility] ENDOSCOPY;  Service: Cardiovascular;  Laterality: N/A;   CARDIOVERSION N/A 02/24/2020   Procedure: CARDIOVERSION;  Surgeon: Pietro Redell RAMAN, MD;  Location: [facility] ENDOSCOPY;  Service: Cardiovascular;  Laterality: N/A;   CARDIOVERSION N/A 04/11/2020   Procedure: CARDIOVERSION;  Surgeon: Raford Riggs, MD;  Location: [facility] ENDOSCOPY;  Service: Cardiovascular;  Laterality: N/A;   CARDIOVERSION N/A 12/26/2020   Procedure: CARDIOVERSION;  Surgeon: Francyne Headland, MD;  Location: [facility]  ENDOSCOPY;  Service: Cardiovascular;  Laterality: N/A;   CATARACT EXTRACTION W/PHACO Left 05/18/2019   Procedure: CATARACT EXTRACTION PHACO AND INTRAOCULAR LENS PLACEMENT (IOC) LEFT  1:21 11.0% 9.01;  Surgeon: Mittie Gaskin, MD;  Location: [facility] SURGERY CNTR;  Service: Ophthalmology;  Laterality: Left;  sleep apnea   CATARACT EXTRACTION W/PHACO Right 06/29/2019   Procedure: CATARACT EXTRACTION PHACO AND INTRAOCULAR LENS PLACEMENT (IOC) RIGHT  01:14.2  14.5%  10.92;  Surgeon: Mittie Gaskin, MD;  Location: [facility];  Service: Ophthalmology;  Laterality: Right;  sleep apnea   CYST EXCISION  03/2019   on back    NM MYOCAR PERF WALL MOTION  05/07/2011   Lexiscan : No ishcemia   PERMANENT PACEMAKER INSERTION  03/12/2010   guidant   PPM GENERATOR CHANGEOUT N/A 08/06/2020   Procedure: PPM GENERATOR CHANGEOUT;  Surgeon: Francyne Headland, MD;  Location: [facility] INVASIVE CV LAB;  Service: Cardiovascular;  Laterality: N/A;   ROTATOR CUFF REPAIR  1996   SHOULDER ARTHROSCOPY     TEE WITHOUT CARDIOVERSION N/A 03/01/2021   Procedure: TRANSESOPHAGEAL ECHOCARDIOGRAM (TEE);  Surgeon: Inocencio Soyla Lunger, MD;  Location: [facility] INVASIVE CV LAB;  Service: Cardiovascular;  Laterality: N/A;   US  ECHOCARDIOGRAPHY  05/07/2011   mod. LVH,LA mod. dilated,borderline aortic root dilatation    Outpatient Medications Prior to Visit  Medication Sig Dispense Refill   allopurinol  (ZYLOPRIM ) 300 MG tablet Take 1 tablet (300 mg total) by mouth daily. 30 tablet 2   amiodarone  (PACERONE ) 400 MG tablet Take 1 tablet (400 mg total) by mouth daily. 90 tablet 3   apixaban  (ELIQUIS ) 5 MG TABS tablet Take 1 tablet (5 mg total) by mouth 2 (two) times daily. 180 tablet 1   Apoaequorin (PREVAGEN PO) Take 1 tablet by mouth daily.     Ascorbic Acid  (VITAMIN C ) 1000 MG tablet Take 1,000 mg by mouth daily.     Cholecalciferol  (VITAMIN D -3 PO) Take 2,500 Units by mouth at bedtime.     colchicine  0.6 MG tablet Take 0.5 tablets (0.3  mg total) by mouth daily as needed. 30 tablet 2   empagliflozin  (JARDIANCE ) 10 MG TABS tablet Take 1 tablet (10 mg total) by mouth daily before breakfast. 90 tablet 3   erythromycin ophthalmic ointment Place 1 Application into both eyes as needed.     ferrous sulfate  325 (65 FE) MG EC tablet Take 1 tablet (325 mg total) by mouth 2 (two) times daily. (Patient taking differently: Take 325 mg by mouth daily at 6 (six) AM.) 180 tablet 3   furosemide  (LASIX ) 40 MG tablet Take 1 tablet (40 mg total) by mouth daily. 90 tablet 3   furosemide  (LASIX ) 80 MG tablet Take 1 tablet (80 mg total) by mouth daily. 90 tablet 3   montelukast  (SINGULAIR ) 10 MG tablet Take 10 mg by mouth at bedtime.     Multiple Vitamin (MULTIVITAMIN  WITH MINERALS) TABS tablet Take 1 tablet by mouth daily. Senior     mupirocin ointment (BACTROBAN) 2 % Apply 1 Application topically as needed.     Omega-3 Fatty Acids (FISH OIL) 1200 MG CAPS Take 1,200 mg by mouth 2 (two) times daily.      potassium chloride  SA (KLOR-CON  M) 20 MEQ tablet Take 1 tablet (20 mEq total) by mouth daily. 90 tablet 3   pyridOXINE (VITAMIN B-6) 100 MG tablet Take 100 mg by mouth daily.     tamsulosin  (FLOMAX ) 0.4 MG CAPS capsule Take 1 capsule (0.4 mg total) by mouth daily. 30 capsule 11   testosterone  cypionate (DEPOTESTOSTERONE CYPIONATE) 200 MG/ML injection Inject 200 mg into the muscle once a week.     triamcinolone cream (KENALOG) 0.1 % Apply 1 Application topically as needed.     vitamin B-12 (CYANOCOBALAMIN ) 1000 MCG tablet Take 1,000 mcg by mouth daily.     Wheat Dextrin (BENEFIBER DRINK MIX PO) Take 30 mLs by mouth in the morning and at bedtime. 2 tablespoons a day     zinc gluconate 50 MG tablet Take 50 mg by mouth daily.     cetirizine (ZYRTEC) 10 MG tablet Take 10 mg by mouth.     citalopram  (CELEXA ) 40 MG tablet Take 1 tablet (40 mg total) by mouth daily. MUST RECEIVE FUTURE REFILLS FROM PCP. 30 tablet 0   doxycycline (VIBRAMYCIN) 100 MG capsule  Take 100 mg by mouth 2 (two) times daily. (Patient not taking: Reported on 05/03/2024)     No facility-administered medications prior to visit.     Allergies:   Patient has no known allergies.   Family History:  The patient's family history includes Alzheimer's disease in his father; Dementia in his sister; Heart disease in his sister; Heart failure in his mother; Stroke in his mother.   ROS:   Please see the history of present illness.   All other systems are reviewed and are negative.   PHYSICAL EXAM:   VS:  BP 100/62 (BP Location: Right Arm, Patient Position: Sitting, Cuff Size: Large)   Pulse 67   Ht 5' 11 (1.803 m)   Wt 218 lb 9.6 oz (99.2 kg)   SpO2 97%   BMI 30.49 kg/m       General: Alert, oriented x3, no distress, borderline obese, healthy pacemaker site in the left subclavian area Head: no evidence of trauma, PERRL, EOMI, no exophtalmos or lid lag, no myxedema, no xanthelasma; normal ears, nose and oropharynx Neck: normal jugular venous pulsations and no hepatojugular reflux; brisk carotid pulses without delay and no carotid bruits Chest: clear to auscultation, no signs of consolidation by percussion or palpation, normal fremitus, symmetrical and full respiratory excursions Cardiovascular: normal position and quality of the apical impulse, regular rhythm, normal first and second heart sounds, no murmurs, rubs or gallops Abdomen: no tenderness or distention, no masses by palpation, no abnormal pulsatility or arterial bruits, normal bowel sounds, no hepatosplenomegaly Extremities: no clubbing, cyanosis or edema; 2+ radial, ulnar and brachial pulses bilaterally; 2+ right femoral, posterior tibial and dorsalis pedis pulses; 2+ left femoral, posterior tibial and dorsalis pedis pulses; no subclavian or femoral bruits Neurological: grossly nonfocal Psych: Normal mood and affect     Wt Readings from Last 3 Encounters:  06/02/24 218 lb 9.6 oz (99.2 kg)  05/03/24 211 lb (95.7  kg)  03/24/24 225 lb (102.1 kg)      Studies/Labs Reviewed:   EKG:  Reviewed the ECG tracing from  03/24/2024 which shows AV sequential pacing with a very broad paced QRS complex with with a duration of 238 ms  EKG Interpretation Date/Time:    Ventricular Rate:    PR Interval:    QRS Duration:    QT Interval:    QTC Calculation:   R Axis:      Text Interpretation:           ASSESSMENT:    1. Atypical atrial flutter (HCC)   2. Pre-op  testing   3. Chronic combined systolic and diastolic heart failure (HCC)   4. Other cardiomyopathy (HCC)   5. Acquired thrombophilia   6. Iron deficiency anemia due to chronic blood loss   7. SSS (sick sinus syndrome) (HCC)   8. CHB (complete heart block) (HCC)   9. Encounter for monitoring amiodarone  therapy   10. Orthostatic hypotension   11. Presence of cardiac pacemaker   12. OSA (obstructive sleep apnea)        PLAN:  In order of problems listed above:  Atrial flutter: He once again presents with an atypical slow flutter/atrial tachycardia and has symptoms of dyspnea, fatigue and weakness acceleration.  Attempts to overdrive pace this arrhythmia promptly led to acceleration to rapid atrial flutter, which could not be paced terminated.  In late November he is scheduled for a third attempt at ablation with Dr. Inocencio .  History of atypical atrial flutter, likely with a left atrial circuit, which has had both successful and unsuccessful attempts at overdrive pacing in the past, not successful today.   CHA2DS2-VASc 4 (age 71, CHF, hypertension).   No history of embolic events.  He is quite symptomatic.  We will schedule him for cardioversion but hopefully will give him some relief until he can undergo ablation. CHF: He has a rather narrow margin of compensation between heart failure and symptomatic orthostatic hypotension.  Currently only on Jardiance  and loop diuretic.  His optimal range generally seems to be in the 215-220 pound weight  range, and he is in that range today.  Low blood pressure precludes adding medical therapy for heart failure.SABRA  His EF is only mildly decreased, but suspect that the cardiomyopathy is largely due to pacing induced dyssynchrony which is quite prominent on his echocardiogram and is also supported by a very broad paced QRS at over 230 ms.   CMP: Have not identified secondary causes for his cardiomyopathy other than the recent 20 possible causes for his cardiomyopathy are considered.  His pyrophosphate scan does not support a diagnosis of amyloidosis.  Evaluation of both invasive angiography and perfusion study has shown no evidence of coronary disease.    Anticoagulation: Has not had overt bleeding.  Anemia has resolved.  He has had a single mechanical fall that thankfully did not lead to serious complications. Iron deficiency anemia: Has been reluctant to undergo endoscopic evaluation due to previous complications. SSS: Before the onset of the atrial arrhythmias heart rate histogram distribution was favorable with conventional sensor settings. CHB: This occurs intermittently.  In the past he has become pacemaker dependent, we have used the current higher dose of amiodarone  400 mg daily. Amiodarone : Borderline TSH in July.  Normal liver function tests.  Recheck thyroid  function studies before the end of the year. Hypotension: Currently not symptomatic.  He has a very tight margin between orthostatic hypotension and heart failure symptoms.  At this point he does not appear to need midodrine.  Keep weight 216-219 pounds. PM: Normal device function, with extremely broad-based QRS.  May benefit from CRT upgrade at some point. OSA: He has had some recent problems with the CPAP equipment but generally reports 100% compliance with therapy and denies daytime hypersomnolence.   Informed Consent   Shared Decision Making/Informed Consent The risks (stroke, cardiac arrhythmias rarely resulting in the need for a  temporary or permanent pacemaker, skin irritation or burns and complications associated with conscious sedation including aspiration, arrhythmia, respiratory failure and death), benefits (restoration of normal sinus rhythm) and alternatives of a direct current cardioversion were explained in detail to Mr. Sohn and he agrees to proceed.          Medication Adjustments/Labs and Tests Ordered: Current medicines are reviewed at length with the patient today.  Concerns regarding medicines are outlined above.  Medication changes, Labs and Tests ordered today are listed in the Patient Instructions below. Patient Instructions  Medication Instructions:  No changes  *If you need a refill on your cardiac medications before your next appointment, please call your pharmacy*   Lab Work: CBC BMP If you have labs (blood work) drawn today and your tests are completely normal, you will receive your results only by: MyChart Message (if you have MyChart) OR A paper copy in the mail If you have any lab test that is abnormal or we need to change your treatment, we will call you to review the results.   Testing/Procedures: Schedule  for Oct 13 , 2025 at 1 pm Your physician has recommended that you have a Cardioversion (DCCV). Electrical Cardioversion uses a jolt of electricity to your heart either through paddles or wired patches attached to your chest. This is a controlled, usually prescheduled, procedure. Defibrillation is done under light anesthesia in the hospital, and you usually go home the day of the procedure. This is done to get your heart back into a normal rhythm. You are not awake for the procedure. Please see the instruction sheet given to you today.   Follow-Up: At [facility], you and your health needs are our priority.  As part of our continuing mission to provide you with exceptional heart care, we have created designated Provider Care Teams.  These Care Teams include your primary  Cardiologist (physician) and Advanced Practice Providers (APPs -  Physician Assistants and Nurse Practitioners) who all work together to provide you with the care you need, when you need it.     Your next appointment:   6 month(s)  The format for your next appointment:   In Person  Provider:   Jerel Balding, MD   Other Instructions      Dear Lynwood JONELLE Lisbeth  You are scheduled for a Cardioversion on Monday, October 13 with Dr. Balding.  Please arrive at the [facility] (Main Entrance A) at [facility]: [address] [place], [place] [address] at 12:00 PM (This time is 1 hour(s) before your procedure to ensure your preparation).   Free valet parking service is available. You will check in at ADMITTING.   *Please Note: You will receive a call the day before your procedure to confirm the appointment time. That time may have changed from the original time based on the schedule for that day.*    DIET:  Nothing to eat or drink after midnight except a sip of water with medications (see medication instructions below)  MEDICATION INSTRUCTIONS: !!IF ANY NEW MEDICATIONS ARE STARTED AFTER TODAY, PLEASE NOTIFY YOUR PROVIDER AS SOON AS POSSIBLE!!  FYI: Medications such as Semaglutide (Ozempic, [place]), Tirzepatide (Mounjaro, Zepbound), Dulaglutide (  Trulicity), etc (GLP1 agonists) AND Canagliflozin (Invokana), Dapagliflozin Pauletta), Empagliflozin  (Jardiance ), Ertugliflozin (Steglatro), Bexagliflozin [employer]) or any combination with one of these drugs such as Invokamet (Canagliflozin/Metformin), Synjardy (Empagliflozin /Metformin), etc (SGLT2 inhibitors) must be held around the time of a procedure. This is not a comprehensive list of all of these drugs. Please review all of your medications and talk to your provider if you take any one of these. If you are not sure, ask your provider.  Jardiance  HOLD: Empagliflozin  (Jardiance ) for 3 days prior to the procedure. Last dose on Thursday,  October 09.  Continue taking your anticoagulant (blood thinner): Apixaban  (Eliquis ).  You will need to continue this after your procedure until you are told by your provider that it is safe to stop.    LABS: CBC,BMP Come to the lab at the [facility] ([address], [place], 1st Floor) between the hours of 8:00 am and 4:30 pm. You do NOT have to be fasting.  FYI:  For your safety, and to allow us  to monitor your vital signs accurately during the surgery/procedure we request: If you have artificial nails, gel coating, SNS etc, please have those removed prior to your surgery/procedure. Not having the nail coverings /polish removed may result in cancellation or delay of your surgery/procedure.  Your support person will be asked to wait in the waiting room during your procedure.  It is OK to have someone drop you off and come back when you are ready to be discharged.  You cannot drive after the procedure and will need someone to drive you home.  Bring your insurance cards.  *Special Note: Every effort is made to have your procedure done on time. Occasionally there are emergencies that occur at the hospital that may cause delays. Please be patient if a delay does occur.         Signed, Jerel Balding, MD   [facility] [address] [place], [place], [place]  [address] Phone: [phone]; Fax: [phone]     ADDENDUM:  Shortly after this office visit almost pacemaker download shows that he has spontaneously returned to normal rhythm (AV sequential pacing).  Will cancel the planned cardioversion and go ahead with the ablation in November.  Jerel Balding, MD, [facility] [phone] office [phone] pager 06/12/2024

## 2024-06-03 ENCOUNTER — Ambulatory Visit: Payer: Self-pay | Admitting: Cardiovascular Disease

## 2024-06-03 LAB — BASIC METABOLIC PANEL WITH GFR
BUN/Creatinine Ratio: 21 (ref 10–24)
BUN: 23 mg/dL (ref 8–27)
CO2: 28 mmol/L (ref 20–29)
Calcium: 9.1 mg/dL (ref 8.6–10.2)
Chloride: 100 mmol/L (ref 96–106)
Creatinine, Ser: 1.12 mg/dL (ref 0.76–1.27)
Glucose: 91 mg/dL (ref 70–99)
Potassium: 4.4 mmol/L (ref 3.5–5.2)
Sodium: 141 mmol/L (ref 134–144)
eGFR: 67 mL/min/1.73 (ref >59)

## 2024-06-03 LAB — CBC
Hematocrit: 51.5 % — ABNORMAL HIGH (ref 37.5–51.0)
Hemoglobin: 17.2 g/dL (ref 13.0–17.7)
MCH: 33.5 pg — ABNORMAL HIGH (ref 26.6–33.0)
MCHC: 33.4 g/dL (ref 31.5–35.7)
MCV: 100 fL — ABNORMAL HIGH (ref 79–97)
Platelets: 305 x10E3/uL (ref 150–450)
RBC: 5.13 x10E6/uL (ref 4.14–5.80)
RDW: 14.2 % (ref 11.6–15.4)
WBC: 8 x10E3/uL (ref 3.4–10.8)

## 2024-06-06 ENCOUNTER — Ambulatory Visit (INDEPENDENT_AMBULATORY_CARE_PROVIDER_SITE_OTHER): Payer: Self-pay

## 2024-06-06 ENCOUNTER — Telehealth: Payer: Self-pay

## 2024-06-06 DIAGNOSIS — I484 Atypical atrial flutter: Secondary | ICD-10-CM | POA: Diagnosis not present

## 2024-06-06 LAB — CUP PACEART REMOTE DEVICE CHECK
Battery Remaining Longevity: 102 mo
Battery Remaining Percentage: 96 %
Brady Statistic RA Percent Paced: 8 %
Brady Statistic RV Percent Paced: 55 %
Date Time Interrogation Session: 20251006000100
Implantable Lead Connection Status: 753985
Implantable Lead Connection Status: 753985
Implantable Lead Implant Date: 20041025
Implantable Lead Implant Date: 20041025
Implantable Lead Location: 753859
Implantable Lead Location: 753860
Implantable Lead Model: 4457
Implantable Lead Model: 4480
Implantable Lead Serial Number: 338209
Implantable Lead Serial Number: 424134
Implantable Pulse Generator Implant Date: 20211206
Lead Channel Impedance Value: 540 Ohm
Lead Channel Impedance Value: 561 Ohm
Lead Channel Setting Pacing Amplitude: 2.5 V
Lead Channel Setting Pacing Amplitude: 2.5 V
Lead Channel Setting Pacing Pulse Width: 0.4 ms
Lead Channel Setting Sensing Sensitivity: 3 mV
Pulse Gen Serial Number: 955727
Zone Setting Status: 755011

## 2024-06-06 NOTE — Telephone Encounter (Signed)
 I contacted the patient and rescheduled his cardioversion to 8:30 AM on 06/13/2024. The patient was advised that his new arrival time will be 7:30 AM. Pt agreed with plan.

## 2024-06-07 ENCOUNTER — Telehealth: Payer: Self-pay

## 2024-06-07 ENCOUNTER — Ambulatory Visit: Payer: Self-pay | Admitting: Cardiovascular Disease

## 2024-06-07 NOTE — Progress Notes (Signed)
 Remote PPM Transmission

## 2024-06-07 NOTE — Telephone Encounter (Signed)
 Remote transmission received 06/06/24. Patient has resumed NSR. Routing to staff to advise considering DCCV is scheduled.

## 2024-06-13 ENCOUNTER — Ambulatory Visit (HOSPITAL_COMMUNITY): Admission: RE | Admit: 2024-06-13 | Source: Home / Self Care | Admitting: Cardiovascular Disease

## 2024-06-13 ENCOUNTER — Encounter (HOSPITAL_COMMUNITY): Admission: RE | Payer: Self-pay | Source: Home / Self Care

## 2024-06-13 SURGERY — CARDIOVERSION (CATH LAB)
Anesthesia: General

## 2024-06-13 NOTE — Progress Notes (Signed)
 Remote PPM Transmission

## 2024-06-22 ENCOUNTER — Encounter (HOSPITAL_COMMUNITY): Payer: Self-pay

## 2024-06-23 ENCOUNTER — Telehealth: Payer: Self-pay

## 2024-06-23 NOTE — Telephone Encounter (Signed)
-----   Message from Nurse Sherri P sent at 05/03/2024  6:04 PM EDT ----- Regarding: 11/20 AFib ablation Precert:  MD: Camnitz Type of ablation: repeat A-fib Diagnosis: afib CPT code: A-fib (06343) Ablation scheduled (date/time): 11/20 @ 7:30 am  Procedure:  Added to calendar? Yes Orders entered? No,  Letter complete? No,  Scheduled with cath lab? Yes Any medications to hold? Yes (please list hold instructions): Jardiance  x3 days Labs ordered (CBC, BMET, PT/INR if on warfarin): Yes Mapping system: CARTO (lab 4 or 6) CARTO/OPAL rep notified? No Cardiac CT needed? Yes, ordered Dye allergy? No Pre-meds ordered and instructions given? No,  Letter method: MyChart H&P:  Device: No  Follow-up:  Cassie/Angel, please schedule Routine.

## 2024-06-26 DIAGNOSIS — Z5181 Encounter for therapeutic drug level monitoring: Secondary | ICD-10-CM | POA: Insufficient documentation

## 2024-06-26 DIAGNOSIS — I422 Other hypertrophic cardiomyopathy: Secondary | ICD-10-CM | POA: Insufficient documentation

## 2024-06-26 DIAGNOSIS — D500 Iron deficiency anemia secondary to blood loss (chronic): Secondary | ICD-10-CM | POA: Insufficient documentation

## 2024-06-26 NOTE — Patient Instructions (Signed)

## 2024-06-27 ENCOUNTER — Ambulatory Visit (HOSPITAL_COMMUNITY)
Admission: RE | Admit: 2024-06-27 | Discharge: 2024-06-27 | Disposition: A | Discharge status: Home / Self Care | Source: Ambulatory Visit | Attending: Cardiology | Admitting: Cardiology

## 2024-06-27 DIAGNOSIS — I4819 Other persistent atrial fibrillation: Secondary | ICD-10-CM | POA: Diagnosis present

## 2024-06-27 MED ORDER — IOHEXOL 350 MG/ML SOLN
100.0000 mL | Freq: Once | INTRAVENOUS | Status: AC | PRN
  Administered 2024-06-27: 100 mL via INTRAVENOUS

## 2024-06-28 ENCOUNTER — Encounter: Payer: Self-pay | Admitting: Nurse Practitioner

## 2024-06-28 ENCOUNTER — Ambulatory Visit: Admitting: Nurse Practitioner

## 2024-06-28 ENCOUNTER — Ambulatory Visit (INDEPENDENT_AMBULATORY_CARE_PROVIDER_SITE_OTHER): Admitting: Nurse Practitioner

## 2024-06-28 ENCOUNTER — Ambulatory Visit

## 2024-06-28 VITALS — BP 121/69 | HR 62 | Temp 97.9°F | Resp 15 | Ht 69.88 in | Wt 218.4 lb

## 2024-06-28 DIAGNOSIS — G4733 Obstructive sleep apnea (adult) (pediatric): Secondary | ICD-10-CM | POA: Diagnosis not present

## 2024-06-28 DIAGNOSIS — I484 Atypical atrial flutter: Secondary | ICD-10-CM

## 2024-06-28 DIAGNOSIS — I5042 Chronic combined systolic (congestive) and diastolic (congestive) heart failure: Secondary | ICD-10-CM

## 2024-06-28 DIAGNOSIS — Z79899 Other long term (current) drug therapy: Secondary | ICD-10-CM

## 2024-06-28 DIAGNOSIS — L03114 Cellulitis of left upper limb: Secondary | ICD-10-CM

## 2024-06-28 DIAGNOSIS — Z5181 Encounter for therapeutic drug level monitoring: Secondary | ICD-10-CM

## 2024-06-28 DIAGNOSIS — Z23 Encounter for immunization: Secondary | ICD-10-CM | POA: Diagnosis not present

## 2024-06-28 DIAGNOSIS — E782 Mixed hyperlipidemia: Secondary | ICD-10-CM | POA: Diagnosis not present

## 2024-06-28 DIAGNOSIS — L0390 Cellulitis, unspecified: Secondary | ICD-10-CM | POA: Insufficient documentation

## 2024-06-28 DIAGNOSIS — Z950 Presence of cardiac pacemaker: Secondary | ICD-10-CM

## 2024-06-28 MED ORDER — CEPHALEXIN 500 MG PO CAPS: 500.0000 mg | ORAL_CAPSULE | Freq: Three times a day (TID) | ORAL | 0 refills | Status: AC

## 2024-06-28 MED ORDER — SILVER SULFADIAZINE 1 % EX CREA
1.0000 | TOPICAL_CREAM | Freq: Every day | CUTANEOUS | 0 refills | Status: AC
Start: 2024-06-28 — End: ?

## 2024-06-28 NOTE — Assessment & Plan Note (Signed)
 Currently present to left upper extremity and to right lower abdomen.  Will start Keflex  TID, on review no interactions with Amiodarone . Refills on Silvadene  sent in to place to areas.  Recommend he monitor and if any increased redness to alert PCP immediately.  Plan on return prior to his ablation to ensure improvement of areas.

## 2024-06-28 NOTE — Progress Notes (Signed)
 New Patient Office Visit  Subjective    Patient ID: Shane [name], male    DOB: 01-09-1946  Age: 78 y.o. MRN: [id number]  CC:  Chief Complaint  Patient presents with   Establish Care    Dr. Corlis patient.    Arm Injury    Slid on concrete still looks to be healing.    Skin Problem    Had a derm procedure at [facility] however now noticed a spot where the removal was done.     HPI Shane [name] presents for new patient visit to establish care.  Introduced to publishing rights manager role and practice setting.  All questions answered.  Discussed provider/patient relationship and expectations.  ATRIAL FIBRILLATION Follows with cardiology, last visit 06/02/24.  Has pacemaker in place (2nd pacemaker), last check 06/07/24.  Has ablation on 07/21/24, had a bad experience on his first one with bleeding at site. Atrial fibrillation status: uncontrolled Satisfied with current treatment: yes  Medication side effects:  no Medication compliance: good compliance Etiology of atrial fibrillation: unknown Palpitations:  no Chest pain:  no Dyspnea on exertion:  no Orthopnea:  no Syncope:  no Edema:  no Ventricular rate control: B-blocker Anti-coagulation: long acting   HYPERTENSION / HYPERLIPIDEMIA No current cholesterol medicine as cardiology recommended, took him off of statin in past.  Did not take statin long, was started by PCP at time. Uses CPAP at home for sleep apnea, 100% of the time. Satisfied with current treatment? yes Duration of hypertension: chronic BP monitoring frequency: not checking BP range:  BP medication side effects: no Duration of hyperlipidemia: chronic Cholesterol medication side effects: no Cholesterol supplements: none Medication compliance: good compliance Aspirin: no Recent stressors: no Recurrent headaches: no Visual changes: no Palpitations: no Dyspnea: no Chest pain: no Lower extremity edema: once and awhile Dizzy/lightheaded: no   SKIN INFECTION Fell on  concrete 05/19/24 and had large skin tear, was seen at [facility] in [place]. Seen in urgent care on 05/24/24 - Silvadene  cream given.  Also recently had a spot removed at [facility] dermatology, but has noticed the area to lower right abdomen site is red and swollen. Duration: weeks Location: to left arm History of trauma in area: yes Pain: yes aggravating Redness: yes Swelling: yes Oozing: no Pus: no Fevers: no Nausea/vomiting: no Status: fluctuating Treatments attempted: Silvadene   Tetanus: UTD    Outpatient Encounter Medications as of 06/28/2024  Medication Sig   allopurinol  (ZYLOPRIM ) 300 MG tablet Take 1 tablet (300 mg total) by mouth daily.   amiodarone  (PACERONE ) 400 MG tablet Take 1 tablet (400 mg total) by mouth daily.   apixaban  (ELIQUIS ) 5 MG TABS tablet Take 1 tablet (5 mg total) by mouth 2 (two) times daily.   Apoaequorin (PREVAGEN PO) Take 1 tablet by mouth daily.   Ascorbic Acid  (VITAMIN C ) 1000 MG tablet Take 1,000 mg by mouth daily.   cephALEXin  (KEFLEX ) 500 MG capsule Take 1 capsule (500 mg total) by mouth 3 (three) times daily for 7 days.   Cholecalciferol  (VITAMIN D -3 PO) Take 2,500 Units by mouth at bedtime.   empagliflozin  (JARDIANCE ) 10 MG TABS tablet Take 1 tablet (10 mg total) by mouth daily before breakfast.   erythromycin ophthalmic ointment Place 1 Application into both eyes as needed.   ferrous sulfate  325 (65 FE) MG EC tablet Take 1 tablet (325 mg total) by mouth 2 (two) times daily. (Patient taking differently: Take 325 mg by mouth daily at 6 (six) AM.)   furosemide  (  LASIX ) 40 MG tablet Take 1 tablet (40 mg total) by mouth daily.   furosemide  (LASIX ) 80 MG tablet Take 1 tablet (80 mg total) by mouth daily.   Multiple Vitamin (MULTIVITAMIN WITH MINERALS) TABS tablet Take 1 tablet by mouth daily. Senior   mupirocin ointment (BACTROBAN) 2 % Apply 1 Application topically as needed.   Omega-3 Fatty Acids (FISH OIL) 1200 MG CAPS Take 1,200 mg by mouth 2 (two)  times daily.    potassium chloride  SA (KLOR-CON  M) 20 MEQ tablet Take 1 tablet (20 mEq total) by mouth daily.   pyridOXINE (VITAMIN B-6) 100 MG tablet Take 100 mg by mouth daily.   silver  sulfADIAZINE  (SILVADENE ) 1 % cream Apply 1 Application topically daily.   testosterone  cypionate (DEPOTESTOSTERONE CYPIONATE) 200 MG/ML injection Inject 200 mg into the muscle once a week.   triamcinolone cream (KENALOG) 0.1 % Apply 1 Application topically as needed.   vitamin B-12 (CYANOCOBALAMIN ) 1000 MCG tablet Take 1,000 mcg by mouth daily.   Wheat Dextrin (BENEFIBER DRINK MIX PO) Take 30 mLs by mouth in the morning and at bedtime. 2 tablespoons a day   zinc gluconate 50 MG tablet Take 50 mg by mouth daily.   [DISCONTINUED] colchicine  0.6 MG tablet Take 0.5 tablets (0.3 mg total) by mouth daily as needed.   [DISCONTINUED] montelukast  (SINGULAIR ) 10 MG tablet Take 10 mg by mouth at bedtime.   [DISCONTINUED] tamsulosin  (FLOMAX ) 0.4 MG CAPS capsule Take 1 capsule (0.4 mg total) by mouth daily. (Patient not taking: Reported on 06/28/2024)   No facility-administered encounter medications on file as of 06/28/2024.    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   guidant  [facility] [phone])   Sinus node dysfunction (HCC)    Sleep apnea    CPAP   Wears hearing aid in both ears     Past Surgical History:  Procedure Laterality Date   ATRIAL FIBRILLATION ABLATION N/A 04/03/2017   Procedure: Atrial Fibrillation Ablation;  Surgeon: Inocencio Soyla Lunger, MD;  Location: [facility] INVASIVE CV LAB;  Service: Cardiovascular;  Laterality: N/A;   ATRIAL FIBRILLATION ABLATION N/A 03/01/2021   Procedure: ATRIAL FIBRILLATION ABLATION;   Surgeon: Inocencio Soyla Lunger, MD;  Location: [facility] INVASIVE CV LAB;  Service: Cardiovascular;  Laterality: N/A;   CARDIAC CATHETERIZATION  10/13/1994   No CAD   CARDIAC CATHETERIZATION  05/10/2003   No CAD   CARDIOVERSION N/A 10/21/2016   Procedure: CARDIOVERSION;  Surgeon: Jerel Balding, MD;  Location: [facility] ENDOSCOPY;  Service: Cardiovascular;  Laterality: N/A;   CARDIOVERSION N/A 05/15/2017   Procedure: CARDIOVERSION;  Surgeon: Okey Vina GAILS, MD;  Location: [facility] ENDOSCOPY;  Service: Cardiovascular;  Laterality: N/A;   CARDIOVERSION N/A 10/21/2019   Procedure: CARDIOVERSION;  Surgeon: Balding Jerel, MD;  Location: [facility] ENDOSCOPY;  Service: Cardiovascular;  Laterality: N/A;   CARDIOVERSION N/A 02/24/2020   Procedure: CARDIOVERSION;  Surgeon: Pietro Redell RAMAN, MD;  Location: [facility] ENDOSCOPY;  Service: Cardiovascular;  Laterality: N/A;   CARDIOVERSION N/A 04/11/2020   Procedure: CARDIOVERSION;  Surgeon: Raford Riggs, MD;  Location: [facility] ENDOSCOPY;  Service: Cardiovascular;  Laterality: N/A;   CARDIOVERSION N/A 12/26/2020   Procedure: CARDIOVERSION;  Surgeon: Francyne Headland, MD;  Location: [facility] ENDOSCOPY;  Service: Cardiovascular;  Laterality: N/A;   CATARACT EXTRACTION W/PHACO Left 05/18/2019   Procedure: CATARACT EXTRACTION PHACO AND INTRAOCULAR LENS PLACEMENT (IOC) LEFT  1:21 11.0% 9.01;  Surgeon: Mittie Gaskin, MD;  Location: [facility]GERY [facility]TR;  Service: Ophthalmology;  Laterality: Left;  sleep apnea   CATARACT EXTRACTION W/PHACO Right 06/29/2019   Procedure: CATARACT EXTRACTION PHACO AND INTRAOCULAR LENS PLACEMENT (IOC) RIGHT  01:14.2  14.5%  10.92;  Surgeon: Mittie Gaskin, MD;  Location: [facility];  Service: Ophthalmology;  Laterality: Right;  sleep apnea   CYST EXCISION  03/2019   on back    EYE SURGERY     INSERT / REPLACE / REMOVE PACEMAKER     NM MYOCAR PERF WALL MOTION  05/07/2011   Lexiscan : No ishcemia   PERMANENT PACEMAKER INSERTION  03/12/2010   guidant    PPM GENERATOR CHANGEOUT N/A 08/06/2020   Procedure: PPM GENERATOR CHANGEOUT;  Surgeon: Francyne Headland, MD;  Location: [facility] INVASIVE CV LAB;  Service: Cardiovascular;  Laterality: N/A;   ROTATOR CUFF REPAIR  1996   SHOULDER ARTHROSCOPY     TEE WITHOUT CARDIOVERSION N/A 03/01/2021   Procedure: TRANSESOPHAGEAL ECHOCARDIOGRAM (TEE);  Surgeon: Inocencio Soyla Lunger, MD;  Location: [facility] INVASIVE CV LAB;  Service: Cardiovascular;  Laterality: N/A;   US  ECHOCARDIOGRAPHY  05/07/2011   mod. LVH,LA mod. dilated,borderline aortic root dilatation    Family History  Problem Relation Age of Onset   Stroke Mother    Heart failure Mother    Alzheimer's disease Father    Heart disease Sister    Dementia Sister     Social History   Socioeconomic History   Marital status: Married    Spouse name: Not on file   Number of children: Not on file   Years of education: Not on file   Highest education level: Associate degree: occupational, scientist, product/process development, or vocational program  Occupational History   Not on file  Tobacco Use   Smoking status: Former    Current packs/day: 0.00    Types: Cigars, Cigarettes    Start date: 3    Quit date: 05/11/1993    Years since quitting: 31.1   Smokeless tobacco: Former    Types: Chew    Quit date: 05/11/1993   Tobacco comments:    Former smoker 04/29/21  Vaping Use   Vaping status: Never Used  Substance and Sexual Activity   Alcohol use: Not Currently    Comment: social   Drug use: No   Sexual activity: Not Currently    Birth control/protection: Abstinence, Surgical  Other Topics Concern   Not on file  Social History Narrative   Not on file   Social Drivers of Health   Financial Resource Strain: Low Risk  (06/24/2024)   Overall Financial Resource Strain (CARDIA)    Difficulty of Paying Living Expenses: Not hard at all  Food Insecurity: No Food Insecurity (06/24/2024)   Hunger Vital Sign    Worried About Running Out of Food in the Last Year: Never true     Ran Out of Food in the Last Year: Never true  Transportation Needs: No Transportation Needs (06/24/2024)   PRAPARE - Administrator, Civil Service (Medical): No    Lack of Transportation (Non-Medical): No  Physical Activity: Inactive (06/24/2024)   Exercise Vital Sign    Days of Exercise per Week: 0 days    Minutes of Exercise per Session: Not on file  Stress:  No Stress Concern Present (06/24/2024)   [employer] of Occupational Health - Occupational Stress Questionnaire    Feeling of Stress: Not at all  Social Connections: Unknown (06/24/2024)   Social Connection and Isolation Panel    Frequency of Communication with Friends and Family: Patient declined    Frequency of Social Gatherings with Friends and Family: Patient declined    Attends Religious Services: Patient declined    Database Administrator or Organizations: Patient declined    Attends Banker Meetings: Not on file    Marital Status: Married  Intimate Partner Violence: Not At Risk (06/28/2024)   Humiliation, Afraid, Rape, and Kick questionnaire    Fear of Current or Ex-Partner: No    Emotionally Abused: No    Physically Abused: No    Sexually Abused: No    Review of Systems  Constitutional:  Negative for chills, diaphoresis, fever and weight loss.  Respiratory:  Negative for cough, shortness of breath and wheezing.   Cardiovascular:  Negative for chest pain, palpitations, orthopnea and leg swelling.  Neurological: Negative.   Psychiatric/Behavioral: Negative.          Objective    BP 121/69 (BP Location: Left Arm, Patient Position: Sitting, Cuff Size: Normal)   Pulse 62   Temp 97.9 F (36.6 C) (Oral)   Resp 15   Ht 5' 9.88 (1.775 m)   Wt 218 lb 6.4 oz (99.1 kg)   SpO2 95%   BMI 31.44 kg/m   Physical Exam Vitals and nursing note reviewed.  Constitutional:      General: He is awake. He is not in acute distress.    Appearance: He is well-developed and well-groomed. He is  obese. He is not ill-appearing or toxic-appearing.  HENT:     Head: Normocephalic.     Right Ear: Hearing and external ear normal.     Left Ear: Hearing and external ear normal.  Eyes:     General: Lids are normal.     Extraocular Movements: Extraocular movements intact.     Conjunctiva/sclera: Conjunctivae normal.  Neck:     Thyroid : No thyromegaly.     Vascular: No carotid bruit.  Cardiovascular:     Rate and Rhythm: Normal rate and regular rhythm.     Heart sounds: Normal heart sounds. No murmur heard.    No gallop.  Pulmonary:     Effort: No accessory muscle usage or respiratory distress.     Breath sounds: Normal breath sounds. No decreased breath sounds, wheezing or rales.  Abdominal:     General: Bowel sounds are normal. There is no distension.     Palpations: Abdomen is soft.     Tenderness: There is no abdominal tenderness.  Musculoskeletal:     Cervical back: Full passive range of motion without pain.     Right lower leg: No edema.     Left lower leg: No edema.  Lymphadenopathy:     Cervical: No cervical adenopathy.  Skin:    General: Skin is warm.     Capillary Refill: Capillary refill takes less than 2 seconds.     Findings: Wound present.      Neurological:     Mental Status: He is alert and oriented to person, place, and time.     Deep Tendon Reflexes: Reflexes are normal and symmetric.     Reflex Scores:      Brachioradialis reflexes are 2+ on the right side and 2+ on the left side.  Patellar reflexes are 2+ on the right side and 2+ on the left side. Psychiatric:        Attention and Perception: Attention normal.        Mood and Affect: Mood normal.        Speech: Speech normal.        Behavior: Behavior normal. Behavior is cooperative.        Thought Content: Thought content normal.    Last CBC Lab Results  Component Value Date   WBC 8.0 06/02/2024   HGB 17.2 06/02/2024   HCT 51.5 (H) 06/02/2024   MCV 100 (H) 06/02/2024   MCH 33.5 (H)  06/02/2024   RDW 14.2 06/02/2024   PLT 305 06/02/2024   Last metabolic panel Lab Results  Component Value Date   GLUCOSE 91 06/02/2024   NA 141 06/02/2024   K 4.4 06/02/2024   CL 100 06/02/2024   CO2 28 06/02/2024   BUN 23 06/02/2024   CREATININE 1.12 06/02/2024   EGFR 67 06/02/2024   CALCIUM 9.1 06/02/2024   PROT 7.4 03/24/2024   ALBUMIN 4.1 03/24/2024   LABGLOB 3.3 03/24/2024   AGRATIO 1.4 01/19/2023   BILITOT 1.0 03/24/2024   ALKPHOS 136 (H) 03/24/2024   AST 22 03/24/2024   ALT 20 03/24/2024   ANIONGAP 7 04/29/2021   Last lipids No results found for: CHOL, HDL, LDLCALC, LDLDIRECT, TRIG, CHOLHDL Last hemoglobin A1c No results found for: HGBA1C Last thyroid  functions Lab Results  Component Value Date   TSH 6.540 (H) 03/24/2024   FREET4 1.24 01/19/2023      Assessment & Plan:   Problem List Items Addressed This Visit       Cardiovascular and Mediastinum   Chronic combined systolic and diastolic heart failure (HCC)   Chronic, stable.  Euvolemic today. Continue current medication regimen as ordered by cardiology and collaboration.  Recent notes reviewed. Recommend: - Reminded to call for an overnight weight gain of >2 pounds or a weekly weight gain of >5 pounds - not adding salt to food and read food labels. Reviewed the importance of keeping daily sodium intake to 2000mg  daily.  - Avoid Ibuprofen products.      Atypical atrial flutter (HCC) - Primary   Chronic, ongoing.  Followed by cardiology. Has upcoming ablation on 07/21/24. Continue current collaboration and medication regimen as ordered.  Recent notes reviewed.        Respiratory   OSA (obstructive sleep apnea)   Chronic, ongoing.  Continue use of CPAP 100% of the time. Praised for consistent use.        Other   Presence of cardiac pacemaker   Followed by cardiology who performs checks.  Continue this collaboration.  Recent notes reviewed.      Mixed hyperlipidemia   Chronic,  ongoing.  Remain off statin therapy, was discontinued by cardiology in past. Continue collaboration with cardiology.      Encounter for monitoring amiodarone  therapy   Remains on this at this time.  TSH elevated in July.  Needs recheck. Plan on checking next visit if not checked on labs scheduled for tomorrow with cardiology.      Cellulitis   Currently present to left upper extremity and to right lower abdomen.  Will start Keflex  TID, on review no interactions with Amiodarone . Refills on Silvadene  sent in to place to areas.  Recommend he monitor and if any increased redness to alert PCP immediately.  Plan on return prior to his ablation to ensure improvement of  areas.       Return in about 2 weeks (around 07/12/2024) for Celluitis check and then 4 months for HTN/HLD, A-FIB, CHF.   Damya Comley T Elie Gragert, NP

## 2024-06-28 NOTE — Assessment & Plan Note (Signed)
 Chronic, stable.  Euvolemic today. Continue current medication regimen as ordered by cardiology and collaboration.  Recent notes reviewed. Recommend: - Reminded to call for an overnight weight gain of >2 pounds or a weekly weight gain of >5 pounds - not adding salt to food and read food labels. Reviewed the importance of keeping daily sodium intake to 2000mg  daily.  - Avoid Ibuprofen products.

## 2024-06-28 NOTE — Assessment & Plan Note (Signed)
 Chronic, ongoing.  Continue use of CPAP 100% of the time. Praised for consistent use.

## 2024-06-28 NOTE — Assessment & Plan Note (Signed)
 Followed by cardiology who performs checks.  Continue this collaboration.  Recent notes reviewed.

## 2024-06-28 NOTE — Addendum Note (Signed)
 Addended by: Shriyan Arakawa M on: 06/28/2024 04:19 PM   Modules accepted: Orders

## 2024-06-28 NOTE — Assessment & Plan Note (Signed)
 Remains on this at this time.  TSH elevated in July.  Needs recheck. Plan on checking next visit if not checked on labs scheduled for tomorrow with cardiology.

## 2024-06-28 NOTE — Assessment & Plan Note (Signed)
 Chronic, ongoing.  Followed by cardiology. Has upcoming ablation on 07/21/24. Continue current collaboration and medication regimen as ordered.  Recent notes reviewed.

## 2024-06-28 NOTE — Assessment & Plan Note (Signed)
 Chronic, ongoing.  Remain off statin therapy, was discontinued by cardiology in past. Continue collaboration with cardiology.

## 2024-06-29 LAB — BASIC METABOLIC PANEL WITH GFR
BUN/Creatinine Ratio: 19 (ref 10–24)
BUN: 23 mg/dL (ref 8–27)
CO2: 24 mmol/L (ref 20–29)
Calcium: 9.7 mg/dL (ref 8.6–10.2)
Chloride: 97 mmol/L (ref 96–106)
Creatinine, Ser: 1.23 mg/dL (ref 0.76–1.27)
Glucose: 101 mg/dL — AB (ref 70–99)
Potassium: 4 mmol/L (ref 3.5–5.2)
Sodium: 138 mmol/L (ref 134–144)
eGFR: 60 mL/min/1.73 (ref >59)

## 2024-06-29 LAB — CBC
Hematocrit: 50.7 % (ref 37.5–51.0)
Hemoglobin: 17.2 g/dL (ref 13.0–17.7)
MCH: 34 pg — ABNORMAL HIGH (ref 26.6–33.0)
MCHC: 33.9 g/dL (ref 31.5–35.7)
MCV: 100 fL — ABNORMAL HIGH (ref 79–97)
Platelets: 274 x10E3/uL (ref 150–450)
RBC: 5.06 x10E6/uL (ref 4.14–5.80)
RDW: 14.2 % (ref 11.6–15.4)
WBC: 13 x10E3/uL — ABNORMAL HIGH (ref 3.4–10.8)

## 2024-06-30 ENCOUNTER — Telehealth (HOSPITAL_COMMUNITY): Payer: Self-pay

## 2024-06-30 ENCOUNTER — Other Ambulatory Visit (HOSPITAL_COMMUNITY)

## 2024-06-30 NOTE — Telephone Encounter (Signed)
 Attempted to reach patient to discuss upcoming procedure. Patient stated that he is in a meeting currently and will call back later. Contact information provided.

## 2024-07-01 ENCOUNTER — Encounter (HOSPITAL_COMMUNITY): Payer: Self-pay

## 2024-07-01 NOTE — Telephone Encounter (Signed)
 Spoke with patient to complete pre-procedure call.     Health status review:  Any new medical conditions, recent signs of acute illness or been started on antibiotics? Pt had a fall on 9/18, injuring left forearm with a large skin tear and left rib pain (resolved). As of 10/28 appointment with PCP, he is being treated for cellulitis of left forearm and area on right lower abdomen from previous dermatology skin biopsy that is slow to heal. He will follow up with PCP on 11/17, to re-evaluate healing progress.  Follow all medication instructions prior to procedure or the procedure may be rescheduled:    Continue taking Eliquis  (Apixaban ) twice daily without missing any doses before procedure. Essential chronic medications:  No medication should be continued, unless told otherwise.  HOLD: Empagliflozin  (Jardiance ) for 3 days prior to the procedure. Last dose on Sunday, November 16.  On the morning of your procedure DO NOT take any medication., including Eliquis  (Apixaban ).  Nothing to eat or drink after midnight prior to your procedure.  Pre-procedure testing scheduled: CT and lab work complete.  Confirmed patient is scheduled for Atrial Fibrillation Ablation on Thursday, November 20 with Dr. Dr. Inocencio. Instructed patient to arrive at the Main Entrance A at [facility]: [address] [place], [place] [address] and check in at Admitting at 5:30 AM.  Plan to go home the same day, you will only stay overnight if medically necessary.. You MUST have a responsible adult to drive you home and MUST be with you the first 24 hours after you arrive home or your procedure could be cancelled.  Informed a nurse may call a day before the procedure to confirm arrival time and ensure instructions are followed.  Patient verbalized understanding to information provided and is agreeable to proceed with procedure.   Advised to contact RN Navigator at [phone], to inform of any new medications started  after call or concerns prior to procedure.

## 2024-07-03 ENCOUNTER — Ambulatory Visit: Payer: Self-pay | Admitting: Cardiology

## 2024-07-04 ENCOUNTER — Other Ambulatory Visit: Payer: Self-pay | Admitting: Cardiovascular Disease

## 2024-07-16 NOTE — Patient Instructions (Signed)
 Cellulitis, Adult    Cellulitis is a skin infection. The infected area is often warm, red, swollen, and sore. It occurs most often on the legs, feet, and toes, but can happen on any part of the body.  This condition can be life-threatening without treatment. It is very important to get treated right away.  What are the causes?  This condition is caused by bacteria. The bacteria enter through a break in the skin, such as:  A cut.  A burn.  A bug bite.  An animal bite.  An open sore.  A crack.  What increases the risk?  Having a weak body's defense system (immune system).  Being older than 80 years old.  Having a blood sugar problem (diabetes).  Having a long-term liver disease (cirrhosis) or kidney disease.  Being very overweight (obese).  Having a skin problem, such as:  An itchy rash.  A rash caused by a fungus.  A rash with blisters.  Slow movement of blood in the veins (venous stasis).  Fluid buildup below the skin (edema).  This condition is more likely to occur in people who:  Have open cuts, burns, bites, or scrapes on the skin.  Have been treated with high-energy rays (radiation).  Use IV drugs.  What are the signs or symptoms?  Skin that:  Looks red or purple, or slightly darker than your usual skin color.  Has streaks.  Has spots.  Is swollen.  Is sore or painful when you touch it.  Is warm.  A fever.  Chills.  Blisters.  Tiredness (fatigue).  How is this treated?  Medicines to treat infections or allergies.  Rest.  Placing cold or warm cloths on the skin.  Staying in the hospital, if the condition is very bad. You may need medicines through an IV.  Follow these instructions at home:  Medicines  Take over-the-counter and prescription medicines only as told by your doctor.  If you were prescribed antibiotics, take them as told by your doctor. Do not stop using them even if you start to feel better.  General instructions  Drink enough fluid to keep your pee (urine) pale yellow.  Do not touch or rub the  infected area.  Raise (elevate) the infected area above the level of your heart while you are sitting or lying down.  Return to your normal activities when your doctor says that it is safe.  Place cold or warm cloths on the area as told by your doctor.  Keep all follow-up visits. Your doctor will need to make sure that a more serious infection is not developing.  Contact a doctor if:  You have a fever.  You do not start to get better after 1-2 days of treatment.  Your bone or joint under the infected area starts to hurt after the skin has healed.  Your infection comes back in the same area or another area. Signs of this may include:  You have a swollen bump in the area.  Your red area gets larger, turns dark in color, or hurts more.  You have more fluid coming from the wound.  Pus or a bad smell develops in your infected area.  You have more pain.  You feel sick and have muscle aches and weakness.  You develop vomiting or watery poop that will not go away.  Get help right away if:  You see red streaks coming from the area.  You notice the skin turns purple or black and falls  off.  These symptoms may be an emergency. Get help right away. Call 911.  Do not wait to see if the symptoms will go away.  Do not drive yourself to the hospital.  This information is not intended to replace advice given to you by your health care provider. Make sure you discuss any questions you have with your health care provider.  Document Revised: 04/15/2022 Document Reviewed: 04/15/2022  Elsevier Patient Education  2024 [employer].

## 2024-07-18 ENCOUNTER — Encounter: Payer: Self-pay | Admitting: Nurse Practitioner

## 2024-07-18 ENCOUNTER — Ambulatory Visit (INDEPENDENT_AMBULATORY_CARE_PROVIDER_SITE_OTHER): Admitting: Nurse Practitioner

## 2024-07-18 VITALS — BP 110/69 | HR 62 | Temp 98.9°F | Resp 14 | Ht 69.88 in | Wt 217.0 lb

## 2024-07-18 DIAGNOSIS — L03114 Cellulitis of left upper limb: Secondary | ICD-10-CM

## 2024-07-18 NOTE — Assessment & Plan Note (Signed)
 Improved on exam today.  Continue Silvadene  as needed.  Recommend he monitor and if any increased redness to alert PCP immediately.

## 2024-07-18 NOTE — Progress Notes (Signed)
 BP 110/69 (BP Location: Left Arm, Patient Position: Sitting, Cuff Size: Normal)   Pulse 62   Temp 98.9 F (37.2 C) (Oral)   Resp 14   Ht 5' 9.88 (1.775 m)   Wt 217 lb (98.4 kg)   SpO2 98%   BMI 31.24 kg/m    Subjective:    Patient ID: Shane [name], male    DOB: [birth year], 78 y.o.   MRN: [id number]  HPI: Shane [name] is a 78 y.o. male  Chief Complaint  Patient presents with   Cellulitis    Much improved   SKIN INFECTION Follow-up today for cellulitis to left forearm Duration: days Location: left forearm History of trauma in area: no Pain: no Redness: improved, less Swelling: no Oozing: no Pus: no Fevers: no Nausea/vomiting: no Status: better Treatments attempted:antibiotics  Tetanus: UTD   Relevant past medical, surgical, family and social history reviewed and updated as indicated. Interim medical history since our last visit reviewed. Allergies and medications reviewed and updated.  Review of Systems  Constitutional:  Negative for activity change, appetite change, diaphoresis, fatigue and fever.  Respiratory:  Negative for cough, chest tightness, shortness of breath and wheezing.   Cardiovascular:  Negative for chest pain, palpitations and leg swelling.  Skin:  Positive for wound.  Neurological: Negative.   Psychiatric/Behavioral: Negative.      Per HPI unless specifically indicated above     Objective:    BP 110/69 (BP Location: Left Arm, Patient Position: Sitting, Cuff Size: Normal)   Pulse 62   Temp 98.9 F (37.2 C) (Oral)   Resp 14   Ht 5' 9.88 (1.775 m)   Wt 217 lb (98.4 kg)   SpO2 98%   BMI 31.24 kg/m   Wt Readings from Last 3 Encounters:  07/18/24 217 lb (98.4 kg)  06/28/24 218 lb 6.4 oz (99.1 kg)  06/02/24 218 lb 9.6 oz (99.2 kg)    Physical Exam Vitals and nursing note reviewed.  Constitutional:      General: He is awake. He is not in acute distress.    Appearance: He is well-developed and well-groomed. He is obese. He is  not ill-appearing or toxic-appearing.  HENT:     Head: Normocephalic.     Right Ear: Hearing and external ear normal.     Left Ear: Hearing and external ear normal.  Eyes:     General: Lids are normal.     Extraocular Movements: Extraocular movements intact.     Conjunctiva/sclera: Conjunctivae normal.  Neck:     Thyroid : No thyromegaly.     Vascular: No carotid bruit.  Cardiovascular:     Rate and Rhythm: Normal rate and regular rhythm.     Heart sounds: Normal heart sounds. No murmur heard.    No gallop.  Pulmonary:     Effort: No accessory muscle usage or respiratory distress.     Breath sounds: Normal breath sounds. No decreased breath sounds, wheezing or rales.  Abdominal:     General: Bowel sounds are normal. There is no distension.     Palpations: Abdomen is soft.     Tenderness: There is no abdominal tenderness.  Musculoskeletal:     Cervical back: Full passive range of motion without pain.     Right lower leg: No edema.     Left lower leg: No edema.  Lymphadenopathy:     Cervical: No cervical adenopathy.  Skin:    General: Skin is warm.     Capillary Refill:  Capillary refill takes less than 2 seconds.     Findings: Wound present.      Neurological:     Mental Status: He is alert and oriented to person, place, and time.     Deep Tendon Reflexes: Reflexes are normal and symmetric.     Reflex Scores:      Brachioradialis reflexes are 2+ on the right side and 2+ on the left side.      Patellar reflexes are 2+ on the right side and 2+ on the left side. Psychiatric:        Attention and Perception: Attention normal.        Mood and Affect: Mood normal.        Speech: Speech normal.        Behavior: Behavior normal. Behavior is cooperative.        Thought Content: Thought content normal.     Results for orders placed or performed in visit on 06/06/24  CUP PACEART REMOTE DEVICE CHECK   Collection Time: 06/06/24 12:01 AM  Result Value Ref Range   Date Time  Interrogation Session [id number]    Pulse Generator Manufacturer BOST    Pulse Gen Model L331 ACCOLADE MRI EL    Pulse Gen Serial Number [id number]    Clinic Name [facility]    Implantable Pulse Generator Type Implantable Pulse Generator    Implantable Pulse Generator Implant Date [id number]    Implantable Lead Manufacturer BOST    Implantable Lead Model 4457 Fineline II Sterox    Implantable Lead Serial Number [id number]    Implantable Lead Implant Date 79958974    Implantable Lead Location Detail 1 APEX    Implantable Lead Location [id number]    Implantable Lead Connection Status [id number]    Implantable Lead Manufacturer BOST    Implantable Lead Model 4480 Fineline II Sterox AJ    Implantable Lead Serial Number [id number]    Implantable Lead Implant Date 79958974    Implantable Lead Location Detail 1 APPENDAGE    Implantable Lead Location [id number]    Implantable Lead Connection Status [id number]    Lead Channel Setting Sensing Sensitivity 3.0 mV   Lead Channel Setting Sensing Adaptation Mode Fixed Pacing    Lead Channel Setting Pacing Amplitude 2.5 V   Lead Channel Setting Pacing Pulse Width 0.4 ms   Lead Channel Setting Pacing Amplitude 2.5 V   Zone Setting Status [id number]    Lead Channel Impedance Value 540 ohm   Lead Channel Impedance Value 561 ohm   Battery Status BOS    Battery Remaining Longevity 102 mo   Battery Remaining Percentage 96 %   Brady Statistic RA Percent Paced 8 %   Brady Statistic RV Percent Paced 55 %      Assessment & Plan:   Problem List Items Addressed This Visit       Other   Cellulitis - Primary   Improved on exam today.  Continue Silvadene  as needed.  Recommend he monitor and if any increased redness to alert PCP immediately.          Follow up plan: Return if symptoms worsen or fail to improve.

## 2024-07-18 NOTE — Telephone Encounter (Signed)
 Dr. Inocencio- patient seen by PCP office today to follow up on cellulitis of left forearm wound. Per notes, status is better and redness has improved. No swelling or fever. Recommend patient continue to monitor, use Silvadene  as needed, and return if symptoms worsen or fail to improve. Is patient OK to continue with AF ablation as planned for 11/20?

## 2024-07-20 NOTE — Pre-Procedure Instructions (Signed)
 Instructed patient on the following items: Arrival time 0515 Nothing to eat or drink after midnight No meds AM of procedure Responsible person to drive you home and stay with you for 24 hrs  Have you missed any doses of anti-coagulant Eliquis- takes twice a day, hasn't missed any doses in last 4 weeks.  Don't take dose morning of procedure.

## 2024-07-21 ENCOUNTER — Inpatient Hospital Stay (HOSPITAL_COMMUNITY)
Admission: RE | Disposition: A | Discharge status: Home / Self Care | Payer: Self-pay | Source: Home / Self Care | Attending: Cardiology

## 2024-07-21 ENCOUNTER — Ambulatory Visit (HOSPITAL_COMMUNITY): Admitting: Anesthesiology

## 2024-07-21 ENCOUNTER — Other Ambulatory Visit: Payer: Self-pay

## 2024-07-21 ENCOUNTER — Ambulatory Visit (HOSPITAL_COMMUNITY)
Admission: RE | Admit: 2024-07-21 | Discharge: 2024-07-21 | Disposition: A | Discharge status: Home / Self Care | Source: Home / Self Care | Attending: Cardiology | Admitting: Cardiology

## 2024-07-21 DIAGNOSIS — I4819 Other persistent atrial fibrillation: Secondary | ICD-10-CM

## 2024-07-21 DIAGNOSIS — I5042 Chronic combined systolic (congestive) and diastolic (congestive) heart failure: Secondary | ICD-10-CM | POA: Diagnosis not present

## 2024-07-21 DIAGNOSIS — A419 Sepsis, unspecified organism: Secondary | ICD-10-CM | POA: Diagnosis not present

## 2024-07-21 DIAGNOSIS — E782 Mixed hyperlipidemia: Secondary | ICD-10-CM

## 2024-07-21 DIAGNOSIS — Z950 Presence of cardiac pacemaker: Secondary | ICD-10-CM | POA: Insufficient documentation

## 2024-07-21 DIAGNOSIS — I483 Typical atrial flutter: Secondary | ICD-10-CM

## 2024-07-21 DIAGNOSIS — G4733 Obstructive sleep apnea (adult) (pediatric): Secondary | ICD-10-CM

## 2024-07-21 DIAGNOSIS — I4891 Unspecified atrial fibrillation: Secondary | ICD-10-CM

## 2024-07-21 DIAGNOSIS — J690 Pneumonitis due to inhalation of food and vomit: Secondary | ICD-10-CM | POA: Diagnosis not present

## 2024-07-21 DIAGNOSIS — Z6830 Body mass index (BMI) 30.0-30.9, adult: Secondary | ICD-10-CM | POA: Insufficient documentation

## 2024-07-21 DIAGNOSIS — E669 Obesity, unspecified: Secondary | ICD-10-CM | POA: Insufficient documentation

## 2024-07-21 HISTORY — PX: ATRIAL FIBRILLATION ABLATION: EP1191

## 2024-07-21 LAB — POCT ACTIVATED CLOTTING TIME
Activated Clotting Time: 274 s
Activated Clotting Time: 314 s
Activated Clotting Time: 331 s

## 2024-07-21 SURGERY — ATRIAL FIBRILLATION ABLATION
Anesthesia: General

## 2024-07-21 MED ORDER — LIDOCAINE 2% (20 MG/ML) 5 ML SYRINGE
INTRAMUSCULAR | Status: DC | PRN
  Administered 2024-07-21: 60 mg via INTRAVENOUS

## 2024-07-21 MED ORDER — HEPARIN (PORCINE) IN NACL 1000-0.9 UT/500ML-% IV SOLN
INTRAVENOUS | Status: DC | PRN
  Administered 2024-07-21 (×2): 500 mL

## 2024-07-21 MED ORDER — DEXAMETHASONE SOD PHOSPHATE PF 10 MG/ML IJ SOLN
INTRAMUSCULAR | Status: DC | PRN
Start: 2024-07-21 — End: 2024-07-21
  Administered 2024-07-21: 5 mg via INTRAVENOUS

## 2024-07-21 MED ORDER — ACETAMINOPHEN 500 MG PO TABS
1000.0000 mg | ORAL_TABLET | Freq: Once | ORAL | Status: AC
  Administered 2024-07-21: 1000 mg via ORAL

## 2024-07-21 MED ORDER — PROTAMINE SULFATE 10 MG/ML IV SOLN
INTRAVENOUS | Status: DC | PRN
Start: 2024-07-21 — End: 2024-07-21
  Administered 2024-07-21: 40 mg via INTRAVENOUS

## 2024-07-21 MED ORDER — HEPARIN SODIUM (PORCINE) 1000 UNIT/ML IJ SOLN
INTRAMUSCULAR | Status: DC | PRN
  Administered 2024-07-21: 1000 [IU] via INTRAVENOUS

## 2024-07-21 MED ORDER — ACETAMINOPHEN 500 MG PO TABS
ORAL_TABLET | ORAL | Status: AC
  Filled 2024-07-21: qty 2

## 2024-07-21 MED ORDER — PROPOFOL 10 MG/ML IV BOLUS
INTRAVENOUS | Status: DC | PRN
  Administered 2024-07-21 (×2): 50 mg via INTRAVENOUS
  Administered 2024-07-21: 100 mg via INTRAVENOUS

## 2024-07-21 MED ORDER — SUGAMMADEX SODIUM 200 MG/2ML IV SOLN
INTRAVENOUS | Status: DC | PRN
  Administered 2024-07-21: 400 mg via INTRAVENOUS

## 2024-07-21 MED ORDER — SODIUM CHLORIDE 0.9% FLUSH: 3.0000 mL | Freq: Two times a day (BID) | INTRAVENOUS | Status: DC

## 2024-07-21 MED ORDER — HEPARIN SODIUM (PORCINE) 1000 UNIT/ML IJ SOLN
INTRAMUSCULAR | Status: AC
Start: 2024-07-21 — End: 2024-07-21
  Filled 2024-07-21: qty 10

## 2024-07-21 MED ORDER — SODIUM CHLORIDE 0.9 % IV SOLN: INTRAVENOUS | Status: DC

## 2024-07-21 MED ORDER — SODIUM CHLORIDE 0.9% FLUSH: 3.0000 mL | INTRAVENOUS | Status: DC | PRN

## 2024-07-21 MED ORDER — PHENYLEPHRINE HCL (PRESSORS) 10 MG/ML IV SOLN
INTRAVENOUS | Status: DC | PRN
  Administered 2024-07-21 (×4): 80 ug via INTRAVENOUS

## 2024-07-21 MED ORDER — SODIUM CHLORIDE 0.9 % IV SOLN: 250.0000 mL | INTRAVENOUS | Status: DC | PRN

## 2024-07-21 MED ORDER — ROCURONIUM BROMIDE 10 MG/ML (PF) SYRINGE
PREFILLED_SYRINGE | INTRAVENOUS | Status: DC | PRN
  Administered 2024-07-21: 10 mg via INTRAVENOUS
  Administered 2024-07-21: 60 mg via INTRAVENOUS

## 2024-07-21 MED ORDER — ACETAMINOPHEN 325 MG PO TABS: 650.0000 mg | ORAL_TABLET | ORAL | Status: DC | PRN

## 2024-07-21 MED ORDER — ONDANSETRON HCL 4 MG/2ML IJ SOLN
INTRAMUSCULAR | Status: DC | PRN
  Administered 2024-07-21: 4 mg via INTRAVENOUS

## 2024-07-21 MED ORDER — CEFAZOLIN SODIUM-DEXTROSE 2-4 GM/100ML-% IV SOLN
INTRAVENOUS | Status: AC
  Filled 2024-07-21: qty 100

## 2024-07-21 MED ORDER — HEPARIN SODIUM (PORCINE) 1000 UNIT/ML IJ SOLN
INTRAMUSCULAR | Status: DC | PRN
  Administered 2024-07-21: 4000 [IU] via INTRAVENOUS
  Administered 2024-07-21: 7000 [IU] via INTRAVENOUS
  Administered 2024-07-21: 15000 [IU] via INTRAVENOUS

## 2024-07-21 MED ORDER — PROPOFOL 500 MG/50ML IV EMUL
INTRAVENOUS | Status: DC | PRN
  Administered 2024-07-21: 120 ug/kg/min via INTRAVENOUS

## 2024-07-21 MED ORDER — CEFAZOLIN SODIUM-DEXTROSE 2-3 GM-%(50ML) IV SOLR
INTRAVENOUS | Status: DC | PRN
  Administered 2024-07-21: 2 g via INTRAVENOUS

## 2024-07-21 MED ORDER — ONDANSETRON HCL 4 MG/2ML IJ SOLN: 4.0000 mg | Freq: Four times a day (QID) | INTRAMUSCULAR | Status: DC | PRN

## 2024-07-21 SURGICAL SUPPLY — 19 items
BLANKET WARM UNDERBOD FULL ACC (MISCELLANEOUS) ×1 IMPLANT
CABLE VARIPULSE STERILE (CATHETERS) IMPLANT
CATH GE 8FR SOUNDSTAR (CATHETERS) IMPLANT
CATH OCTARAY 2.0 F 3-3-3-3-3 (CATHETERS) IMPLANT
CATH SMTCH THERMOCOOL SF DF (CATHETERS) IMPLANT
CATH WEBSTER BI DIR CS D-F CRV (CATHETERS) IMPLANT
CATHETER VARIPULSE 8.5FR (CATHETERS) IMPLANT
CLOSURE MYNX CONTROL 6F/7F (Vascular Products) IMPLANT
CLOSURE PERCLOSE PROSTYLE (Vascular Products) IMPLANT
COVER SWIFTLINK CONNECTOR (BAG) ×1 IMPLANT
KIT VERSACROSS 8.5F 63 45D 180 (KITS) IMPLANT
PACK EP LF (CUSTOM PROCEDURE TRAY) ×1 IMPLANT
PAD DEFIB RADIO PHYSIO CONN (PAD) ×1 IMPLANT
PATCH CARTO3 (PAD) IMPLANT
SHEATH CARTO VIZIGO SM CVD (SHEATH) IMPLANT
SHEATH PINNACLE 8F 10CM (SHEATH) IMPLANT
SHEATH PINNACLE 9F 10CM (SHEATH) IMPLANT
SHEATH PROBE COVER 6X72 (BAG) IMPLANT
TUBING SMART ABLATE COOLFLOW (TUBING) IMPLANT

## 2024-07-21 NOTE — H&P (Signed)
  Electrophysiology Office Note:   Date:  07/21/2024  [name], Shane [name] 05/29/1946, MRN [id number]  Primary Cardiologist: Jerel Balding, MD Primary Heart Failure: None Electrophysiologist: Deante Blough Gladis Norton, MD      History of Present Illness:   Shane [name] is a 78 y.o. male with h/o atrial fibrillation, sick sinus syndrome, sleep apnea seen today for  for Electrophysiology evaluation of atrial fibrillation at the request of Mihai Croitoru.    Today, denies symptoms of palpitations, chest pain, dyspnea, orthopnea, PND, lower extremity edema, claudication, dizziness, presyncope, syncope, bleeding, or neurologic sequela. The patient is tolerating medications without difficulties. Plan ablation today.    EP Information / Studies Reviewed:    EKG is not ordered today. EKG from 03/24/2024 reviewed which showed atrial sensed, ventricular paced      PPM Interrogation-  reviewed in detail today,  See PACEART report.  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 The patient's score is based upon: CHF History: 1 HTN History: 0 Diabetes History: 0 Stroke History: 0 Vascular Disease History: 0 Age Score: 2 Gender Score: 0            Physical Exam:   VS:  BP 111/62   Pulse 60   Temp 98.3 F (36.8 C)   Resp 16   Ht 5' 11 (1.803 m)   Wt 98.4 kg   SpO2 97%   BMI 30.27 kg/m    Wt Readings from Last 3 Encounters:  07/21/24 98.4 kg  07/18/24 98.4 kg  06/28/24 99.1 kg    GEN: Well nourished, well developed in no acute distress NECK: No JVD; No carotid bruits CARDIAC: Regular rate and rhythm, no murmurs, rubs, gallops RESPIRATORY:  Clear to auscultation without rales, wheezing or rhonchi  ABDOMEN: Soft, non-tender, non-distended EXTREMITIES:  No edema; No deformity    ASSESSMENT AND PLAN:    SND s/p  [employer] PPM  Normal PPM function See Elisabeth Art report No changes today  2.  Persistent atrial fibrillation/atypical atrial flutter/atrial tachycardia: Shane [name] has presented today for surgery, with the diagnosis of AF.  The various methods of treatment have been discussed with the patient and family. After consideration of risks, benefits and other options for treatment, the patient has consented to  Procedure(s): Catheter ablation as a surgical intervention .  Risks include but not limited to complete heart block, stroke, esophageal damage, nerve damage, bleeding, vascular damage, tamponade, perforation, MI, and death. The patient's history has been reviewed, patient examined, no change in status, stable for surgery.  I have reviewed the patient's chart and labs.  Questions were answered to the patient's satisfaction.    Jenise Iannelli Norton, MD 07/21/2024 7:07 AM

## 2024-07-21 NOTE — Discharge Instructions (Signed)

## 2024-07-21 NOTE — Progress Notes (Signed)
 Patient ambulated in the hallway. Patient ambulated to the bathroom and was able to void. Old drainage to right groin dressing. Dressing changed and bilateral groin dressings clean, dry, and intact. No hematoma or bleeding noted to the sites. Patient dressed for discharge. No changes to the sites.

## 2024-07-21 NOTE — Anesthesia Preprocedure Evaluation (Addendum)
 Anesthesia Evaluation  Patient identified by MRN, date of birth, ID band Patient awake    Reviewed: Allergy & Precautions, NPO status , Patient's Chart, lab work & pertinent test results  History of Anesthesia Complications Negative for: history of anesthetic complications  Airway Mallampati: III  TM Distance: >3 FB Neck ROM: Full    Dental  (+) Teeth Intact, Dental Advisory Given   Pulmonary neg shortness of breath, sleep apnea and Continuous Positive Airway Pressure Ventilation , neg COPD, neg recent URI, former smoker     breath sounds clear to auscultation       Cardiovascular (-) hypertension(-) angina +CHF  (-) Past MI + dysrhythmias Atrial Fibrillation + pacemaker  Rhythm:Irregular Rate:Abnormal     Neuro/Psych negative neurological ROS  negative psych ROS   GI/Hepatic negative GI ROS, Neg liver ROS,,,  Endo/Other  negative endocrine ROS  Obesity   Renal/GU negative Renal ROS     Musculoskeletal  (+) Arthritis ,    Abdominal   Peds  Hematology  (+) Blood dyscrasia, anemia eliquis  for afib   Anesthesia Other Findings Day of surgery medications reviewed with the patient.  Reproductive/Obstetrics                              Anesthesia Physical Anesthesia Plan  ASA: 3  Anesthesia Plan: General   Post-op Pain Management: Tylenol  PO (pre-op )*   Induction: Intravenous  PONV Risk Score and Plan: 2 and Ondansetron  and Dexamethasone   Airway Management Planned: Oral ETT and Video Laryngoscope Planned  Additional Equipment: None  Intra-op Plan:   Post-operative Plan: Extubation in OR  Informed Consent: I have reviewed the patients History and Physical, chart, labs and discussed the procedure including the risks, benefits and alternatives for the proposed anesthesia with the patient or authorized representative who has indicated his/her understanding and acceptance.      Dental advisory given  Plan Discussed with: CRNA and Surgeon  Anesthesia Plan Comments:          Anesthesia Quick Evaluation

## 2024-07-21 NOTE — Transfer of Care (Signed)
 Immediate Anesthesia Transfer of Care Note  Patient: Shane [name]  Procedure(s) Performed: ATRIAL FIBRILLATION ABLATION  Patient Location: PACU  Anesthesia Type:General  Level of Consciousness: awake  Airway & Oxygen Therapy: Patient Spontanous Breathing  Post-op Assessment: Report given to RN  Post vital signs: stable  Last Vitals:  Vitals Value Taken Time  BP 95/51 07/21/24 09:30  Temp    Pulse 65 07/21/24 09:33  Resp 24 07/21/24 09:33  SpO2 94 % 07/21/24 09:33  Vitals shown include unfiled device data.  Last Pain:  Vitals:   07/21/24 0603  PainSc: 0-No pain         Complications: There were no known notable events for this encounter.

## 2024-07-21 NOTE — Anesthesia Procedure Notes (Signed)
 Procedure Name: Intubation Date/Time: 07/21/2024 7:48 AM  Performed by: Theophilus Burnet, Aloysius Pour, CRNAPre-anesthesia Checklist: Patient identified, Emergency Drugs available, Suction available and Patient being monitored Patient Re-evaluated:Patient Re-evaluated prior to induction Oxygen Delivery Method: Circle system utilized Preoxygenation: Pre-oxygenation with 100% oxygen Induction Type: IV induction Ventilation: Mask ventilation without difficulty and Oral airway inserted - appropriate to patient size Laryngoscope Size: Mac and 4 Grade View: Grade I Tube type: Oral Tube size: 7.0 mm Number of attempts: 1 Airway Equipment and Method: Stylet, Oral airway and Video-laryngoscopy (glidescope go) Placement Confirmation: ETT inserted through vocal cords under direct vision, positive ETCO2 and breath sounds checked- equal and bilateral Secured at: 23 cm Tube secured with: Tape Dental Injury: Teeth and Oropharynx as per pre-operative assessment

## 2024-07-22 ENCOUNTER — Encounter (HOSPITAL_COMMUNITY): Payer: Self-pay | Admitting: Cardiology

## 2024-07-22 ENCOUNTER — Telehealth (HOSPITAL_COMMUNITY): Payer: Self-pay

## 2024-07-22 MED FILL — Cefazolin Sodium-Dextrose IV Solution 2 GM/100ML-4%: INTRAVENOUS | Qty: 100 | Status: AC

## 2024-07-22 NOTE — Anesthesia Postprocedure Evaluation (Signed)
 Anesthesia Post Note  Patient: ROMON DEVEREUX  Procedure(s) Performed: ATRIAL FIBRILLATION ABLATION     Patient location during evaluation: PACU Anesthesia Type: General Level of consciousness: awake and alert Pain management: pain level controlled Vital Signs Assessment: post-procedure vital signs reviewed and stable Respiratory status: spontaneous breathing, nonlabored ventilation, respiratory function stable and patient connected to nasal cannula oxygen Cardiovascular status: blood pressure returned to baseline and stable Postop Assessment: no apparent nausea or vomiting Anesthetic complications: no   There were no known notable events for this encounter.  Last Vitals:  Vitals:   07/21/24 1306 07/21/24 1315  BP: (!) 94/59 (!) 101/56  Pulse: 63 68  Resp: 16   Temp:    SpO2: 94% 95%    Last Pain:  Vitals:   07/21/24 1024  TempSrc:   PainSc: 0-No pain                 Epifanio Lamar BRAVO

## 2024-07-22 NOTE — Telephone Encounter (Signed)
 Spoke with patient to complete post procedure follow up call.  Patient reports no complications with groin sites.   Instructions reviewed with patient:  Remove large bandage at puncture site after 24 hours. It is normal to have bruising, tenderness, mild swelling, and a pea or marble sized lump/knot at the groin site which can take up to three months to resolve.  Get help right away if you notice sudden swelling at the puncture site.  Check your puncture site every day for signs of infection: fever, redness, swelling, pus drainage, warmth, foul odor or excessive pain. If this occurs, please call [phone], to speak with the RN Navigator. Get help right away if your puncture site is bleeding and the bleeding does not stop after applying firm pressure to the area.  You may continue to have skipped beats/ atrial fibrillation during the first several months after your procedure.  It is very important not to miss any doses of your blood thinner Eliquis .    You will follow up with the Afib clinic 4 weeks after your procedure and follow up with the Afib clinic 3 months after your procedure.  Activity restrictions reviewed.  Patient verbalized understanding to all instructions provided.

## 2024-07-23 ENCOUNTER — Telehealth: Payer: Self-pay

## 2024-07-23 ENCOUNTER — Emergency Department (HOSPITAL_COMMUNITY)

## 2024-07-23 ENCOUNTER — Encounter (HOSPITAL_COMMUNITY): Payer: Self-pay | Admitting: *Deleted

## 2024-07-23 ENCOUNTER — Inpatient Hospital Stay (HOSPITAL_COMMUNITY)
Admission: EM | Admit: 2024-07-23 | Discharge: 2024-07-27 | DRG: 853 | Disposition: A | Discharge status: Home / Self Care | Attending: Family Medicine | Admitting: Family Medicine

## 2024-07-23 ENCOUNTER — Other Ambulatory Visit: Payer: Self-pay

## 2024-07-23 DIAGNOSIS — Z961 Presence of intraocular lens: Secondary | ICD-10-CM | POA: Diagnosis present

## 2024-07-23 DIAGNOSIS — Z9842 Cataract extraction status, left eye: Secondary | ICD-10-CM

## 2024-07-23 DIAGNOSIS — R7989 Other specified abnormal findings of blood chemistry: Secondary | ICD-10-CM

## 2024-07-23 DIAGNOSIS — E669 Obesity, unspecified: Secondary | ICD-10-CM | POA: Diagnosis present

## 2024-07-23 DIAGNOSIS — Z7984 Long term (current) use of oral hypoglycemic drugs: Secondary | ICD-10-CM

## 2024-07-23 DIAGNOSIS — E876 Hypokalemia: Secondary | ICD-10-CM | POA: Diagnosis present

## 2024-07-23 DIAGNOSIS — A419 Sepsis, unspecified organism: Principal | ICD-10-CM | POA: Diagnosis present

## 2024-07-23 DIAGNOSIS — J690 Pneumonitis due to inhalation of food and vomit: Secondary | ICD-10-CM | POA: Diagnosis present

## 2024-07-23 DIAGNOSIS — G4733 Obstructive sleep apnea (adult) (pediatric): Secondary | ICD-10-CM | POA: Diagnosis present

## 2024-07-23 DIAGNOSIS — Z950 Presence of cardiac pacemaker: Secondary | ICD-10-CM

## 2024-07-23 DIAGNOSIS — Z79899 Other long term (current) drug therapy: Secondary | ICD-10-CM

## 2024-07-23 DIAGNOSIS — Z9889 Other specified postprocedural states: Secondary | ICD-10-CM

## 2024-07-23 DIAGNOSIS — I959 Hypotension, unspecified: Secondary | ICD-10-CM | POA: Diagnosis present

## 2024-07-23 DIAGNOSIS — Z823 Family history of stroke: Secondary | ICD-10-CM

## 2024-07-23 DIAGNOSIS — Z6831 Body mass index (BMI) 31.0-31.9, adult: Secondary | ICD-10-CM

## 2024-07-23 DIAGNOSIS — Z820 Family history of epilepsy and other diseases of the nervous system: Secondary | ICD-10-CM

## 2024-07-23 DIAGNOSIS — J189 Pneumonia, unspecified organism: Secondary | ICD-10-CM | POA: Diagnosis present

## 2024-07-23 DIAGNOSIS — Z9841 Cataract extraction status, right eye: Secondary | ICD-10-CM

## 2024-07-23 DIAGNOSIS — I2489 Other forms of acute ischemic heart disease: Secondary | ICD-10-CM | POA: Diagnosis present

## 2024-07-23 DIAGNOSIS — D509 Iron deficiency anemia, unspecified: Secondary | ICD-10-CM | POA: Diagnosis present

## 2024-07-23 DIAGNOSIS — Z7901 Long term (current) use of anticoagulants: Secondary | ICD-10-CM

## 2024-07-23 DIAGNOSIS — D6869 Other thrombophilia: Secondary | ICD-10-CM | POA: Diagnosis present

## 2024-07-23 DIAGNOSIS — Z8249 Family history of ischemic heart disease and other diseases of the circulatory system: Secondary | ICD-10-CM

## 2024-07-23 DIAGNOSIS — I5042 Chronic combined systolic (congestive) and diastolic (congestive) heart failure: Secondary | ICD-10-CM | POA: Diagnosis present

## 2024-07-23 DIAGNOSIS — I4819 Other persistent atrial fibrillation: Secondary | ICD-10-CM | POA: Diagnosis present

## 2024-07-23 DIAGNOSIS — Z87891 Personal history of nicotine dependence: Secondary | ICD-10-CM

## 2024-07-23 DIAGNOSIS — I483 Typical atrial flutter: Secondary | ICD-10-CM | POA: Diagnosis present

## 2024-07-23 DIAGNOSIS — I495 Sick sinus syndrome: Secondary | ICD-10-CM | POA: Diagnosis present

## 2024-07-23 DIAGNOSIS — R509 Fever, unspecified: Principal | ICD-10-CM

## 2024-07-23 DIAGNOSIS — I77810 Thoracic aortic ectasia: Secondary | ICD-10-CM | POA: Diagnosis present

## 2024-07-23 LAB — URINALYSIS, W/ REFLEX TO CULTURE (INFECTION SUSPECTED)
Bilirubin Urine: NEGATIVE
Glucose, UA: >=500 mg/dL — AB
Ketones, ur: NEGATIVE mg/dL
Leukocytes,Ua: NEGATIVE
Nitrite: NEGATIVE
Protein, ur: 30 mg/dL — AB
Specific Gravity, Urine: 1.02 (ref 1.005–1.030)
pH: 5 (ref 5.0–8.0)

## 2024-07-23 LAB — COMPREHENSIVE METABOLIC PANEL WITH GFR
ALT: 41 U/L (ref 0–44)
AST: 32 U/L (ref 15–41)
Albumin: 3 g/dL — ABNORMAL LOW (ref 3.5–5.0)
Alkaline Phosphatase: 81 U/L (ref 38–126)
Anion gap: 8 (ref 5–15)
BUN: 20 mg/dL (ref 8–23)
CO2: 28 mmol/L (ref 22–32)
Calcium: 8.4 mg/dL — ABNORMAL LOW (ref 8.9–10.3)
Chloride: 95 mmol/L — ABNORMAL LOW (ref 98–111)
Creatinine, Ser: 1.22 mg/dL (ref 0.61–1.24)
GFR, Estimated: >60 mL/min (ref >60)
Glucose, Bld: 93 mg/dL (ref 70–99)
Potassium: 3.5 mmol/L (ref 3.5–5.1)
Sodium: 131 mmol/L — ABNORMAL LOW (ref 135–145)
Total Bilirubin: 1.6 mg/dL — ABNORMAL HIGH (ref 0.0–1.2)
Total Protein: 6.7 g/dL (ref 6.5–8.1)

## 2024-07-23 LAB — TROPONIN I (HIGH SENSITIVITY): Troponin I (High Sensitivity): 155 ng/L (ref <18)

## 2024-07-23 LAB — CBC WITH DIFFERENTIAL/PLATELET
Abs Immature Granulocytes: 0.06 K/uL (ref 0.00–0.07)
Basophils Absolute: 0 K/uL (ref 0.0–0.1)
Basophils Relative: 0 %
Eosinophils Absolute: 0.1 K/uL (ref 0.0–0.5)
Eosinophils Relative: 1 %
HCT: 43.7 % (ref 39.0–52.0)
Hemoglobin: 14.9 g/dL (ref 13.0–17.0)
Immature Granulocytes: 1 %
Lymphocytes Relative: 7 %
Lymphs Abs: 0.8 K/uL (ref 0.7–4.0)
MCH: 33.6 pg (ref 26.0–34.0)
MCHC: 34.1 g/dL (ref 30.0–36.0)
MCV: 98.6 fL (ref 80.0–100.0)
Monocytes Absolute: 0.9 K/uL (ref 0.1–1.0)
Monocytes Relative: 8 %
Neutro Abs: 9.7 K/uL — ABNORMAL HIGH (ref 1.7–7.7)
Neutrophils Relative %: 83 %
Platelets: 195 K/uL (ref 150–400)
RBC: 4.43 MIL/uL (ref 4.22–5.81)
RDW: 14.5 % (ref 11.5–15.5)
WBC: 11.6 K/uL — ABNORMAL HIGH (ref 4.0–10.5)
nRBC: 0 % (ref 0.0–0.2)

## 2024-07-23 LAB — RESP PANEL BY RT-PCR (RSV, FLU A&B, COVID)  RVPGX2
Influenza A by PCR: NEGATIVE
Influenza B by PCR: NEGATIVE
Resp Syncytial Virus by PCR: NEGATIVE
SARS Coronavirus 2 by RT PCR: NEGATIVE

## 2024-07-23 LAB — PROTIME-INR
INR: 1.4 — ABNORMAL HIGH (ref 0.8–1.2)
Prothrombin Time: 18.3 s — ABNORMAL HIGH (ref 11.4–15.2)

## 2024-07-23 LAB — I-STAT CG4 LACTIC ACID, ED
Lactic Acid, Venous: 0.8 mmol/L (ref 0.5–1.9)
Lactic Acid, Venous: 0.6 mmol/L (ref 0.5–1.9)

## 2024-07-23 LAB — BRAIN NATRIURETIC PEPTIDE: B Natriuretic Peptide: 569.7 pg/mL — ABNORMAL HIGH (ref 0.0–100.0)

## 2024-07-23 MED ORDER — VANCOMYCIN HCL 2000 MG/400ML IV SOLN
2000.0000 mg | Freq: Once | INTRAVENOUS | Status: AC
  Administered 2024-07-23: 2000 mg via INTRAVENOUS
  Filled 2024-07-23: qty 400

## 2024-07-23 MED ORDER — SODIUM CHLORIDE 0.9 % IV SOLN: INTRAVENOUS | Status: AC

## 2024-07-23 MED ORDER — ACETAMINOPHEN 325 MG PO TABS
650.0000 mg | ORAL_TABLET | Freq: Once | ORAL | Status: AC
  Administered 2024-07-23: 650 mg via ORAL
  Filled 2024-07-23: qty 2

## 2024-07-23 MED ORDER — LACTATED RINGERS IV SOLN
150.0000 mL/h | INTRAVENOUS | Status: AC
  Administered 2024-07-24 (×2): 150 mL/h via INTRAVENOUS

## 2024-07-23 MED ORDER — LACTATED RINGERS IV BOLUS
500.0000 mL | Freq: Once | INTRAVENOUS | Status: AC
  Administered 2024-07-23: 500 mL via INTRAVENOUS

## 2024-07-23 MED ORDER — PIPERACILLIN-TAZOBACTAM 3.375 G IVPB 30 MIN
3.3750 g | Freq: Three times a day (TID) | INTRAVENOUS | Status: DC
  Administered 2024-07-24 – 2024-07-27 (×10): 3.375 g via INTRAVENOUS
  Filled 2024-07-23 (×11): qty 50

## 2024-07-23 MED ORDER — METRONIDAZOLE 500 MG/100ML IV SOLN
500.0000 mg | Freq: Once | INTRAVENOUS | Status: AC
  Administered 2024-07-23: 500 mg via INTRAVENOUS
  Filled 2024-07-23: qty 100

## 2024-07-23 MED ORDER — SODIUM CHLORIDE 0.9 % IV SOLN
2.0000 g | Freq: Once | INTRAVENOUS | Status: AC
  Administered 2024-07-23: 2 g via INTRAVENOUS
  Filled 2024-07-23: qty 12.5

## 2024-07-23 NOTE — Telephone Encounter (Signed)
 The patient started to feel unwell yesterday with chills and worsening fatigue. Today, he felt even worse and noted a temperature of 102F. He also feels lightheaded and dizziness. He has some shortness of breath but no cough. I recommend that the patient come to the ED for further evaluation. I spoke to his daughter, Warren, and she is bringing Shane [name] to the ED.

## 2024-07-23 NOTE — Progress Notes (Signed)
 ED Pharmacy Antibiotic Sign Off An antibiotic consult was received from an ED provider for cefepime  and vancomycin  per pharmacy dosing for bacteremia and sepsis. A chart review was completed to assess appropriateness.  A single dose of flagyl  placed by the ED provider.   The following one time order(s) were placed per pharmacy consult:  cefepime  2000 mg x 1 dose vancomycin  2000 mg x 1 dose  Further antibiotic and/or antibiotic pharmacy consults should be ordered by the admitting provider if indicated.   Thank you for allowing pharmacy to be a part of this patient's care.   Dorn Buttner, PharmD, BCPS 07/23/2024 9:24 PM ED Clinical Pharmacist -  [phone]

## 2024-07-23 NOTE — Sepsis Progress Note (Signed)
 Following for sepsis monitoring ?

## 2024-07-23 NOTE — ED Provider Notes (Signed)
 [place] [facility] DEPARTMENT AT [facility] Provider Note   CSN: [id number] Arrival date & time: 07/23/24  8068     Patient presents with: No chief complaint on file.   Shane [name] is a 78 y.o. male.  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 Authorizing Provider  allopurinol  (ZYLOPRIM ) 300 MG tablet Take 1 tablet (300 mg total) by mouth daily. 10/05/19   Dolphus Reiter, MD  amiodarone  (PACERONE ) 400 MG tablet Take 1 tablet (400 mg total) by mouth daily. 03/24/24   Croitoru, Mihai, MD  apixaban  (ELIQUIS ) 5 MG TABS tablet Take 1 tablet (5 mg total) by mouth 2 (two) times daily. 02/15/24   Croitoru, Mihai, MD  Apoaequorin (PREVAGEN PO) Take 1 tablet by mouth daily.    [provider]  Ascorbic Acid  (VITAMIN C ) 1000 MG tablet Take 1,000 mg by mouth daily.    [provider]  Cholecalciferol  (VITAMIN D -3 PO) Take 2,500 Units by mouth at bedtime.    [provider]  empagliflozin  (JARDIANCE ) 10 MG TABS tablet Take 1 tablet (10 mg total) by mouth daily before breakfast. 07/06/24   Croitoru, Mihai, MD  erythromycin ophthalmic ointment Place 1 Application into both eyes as needed. 08/14/22   [provider]  ferrous sulfate  325 (65 FE) MG EC tablet Take 1 tablet  (325 mg total) by mouth 2 (two) times daily. Patient taking differently: Take 325 mg by mouth daily at 6 (six) AM. 09/21/23   Croitoru, Jerel, MD  furosemide  (LASIX ) 40 MG tablet Take 1 tablet (40 mg total) by mouth daily. 02/25/24 07/21/24  Croitoru, Mihai, MD  furosemide  (LASIX ) 80 MG tablet Take 1 tablet (80 mg total) by mouth daily. 09/04/23 07/18/24  Meng, Hao, PA  Multiple Vitamin (MULTIVITAMIN WITH MINERALS) TABS tablet Take 1 tablet by mouth daily. Senior    [provider]  mupirocin ointment (BACTROBAN) 2 % Apply 1 Application topically as needed. 01/27/24   [provider]  Omega-3 Fatty Acids (FISH OIL) 1200 MG CAPS Take 1,200 mg by mouth 2 (two) times daily.     [provider]  potassium chloride  SA (KLOR-CON  M) 20 MEQ tablet Take 1 tablet (20 mEq total) by mouth daily. 04/25/24   Croitoru, Mihai, MD  pyridOXINE (VITAMIN B-6) 100 MG tablet Take 100 mg by mouth daily.    [provider]  silver  sulfADIAZINE  (SILVADENE ) 1 % cream Apply 1 Application topically daily. 06/28/24   Cannady, [name], NP  testosterone  cypionate (DEPOTESTOSTERONE CYPIONATE) 200 MG/ML injection Inject 200 mg into the muscle once a week.    [provider]  triamcinolone cream (KENALOG) 0.1 % Apply 1 Application  topically as needed. 02/03/24   [provider]  vitamin B-12 (CYANOCOBALAMIN ) 1000 MCG tablet Take 1,000 mcg by mouth daily.    [provider]  Wheat Dextrin (BENEFIBER DRINK MIX PO) Take 30 mLs by mouth in the morning and at bedtime. 2 tablespoons a day    [provider]  zinc gluconate 50 MG tablet Take 50 mg by mouth daily.    [provider]    Allergies: Patient has no known allergies.    Review of Systems  Updated Vital Signs BP (!) 117/54   Pulse (!) 59   Temp (!) 100.6 F (38.1 C) (Oral)   Resp 20   Ht 5' 11 (1.803 m)   Wt 98.4 kg   SpO2 96%   BMI 30.26 kg/m   Physical Exam  (all labs ordered are listed,  but only abnormal results are displayed) Labs Reviewed  CBC WITH DIFFERENTIAL/PLATELET - Abnormal; Notable for the following components:      Result Value   WBC 11.6 (*)    Neutro Abs 9.7 (*)    All other components within normal limits  PROTIME-INR - Abnormal; Notable for the following components:   Prothrombin Time 18.3 (*)    INR 1.4 (*)    All other components within normal limits  URINALYSIS, W/ REFLEX TO CULTURE (INFECTION SUSPECTED) - Abnormal; Notable for the following components:   Color, Urine AMBER (*)    Glucose, UA >=500 (*)    Hgb urine dipstick MODERATE (*)    Protein, ur 30 (*)    Bacteria, UA RARE (*)    All other components within normal limits  CULTURE, BLOOD (ROUTINE X 2)  CULTURE, BLOOD (ROUTINE X 2)  RESP PANEL BY RT-PCR (RSV, FLU A&B, COVID)  [id number]  COMPREHENSIVE METABOLIC PANEL WITH GFR  I-STAT CG4 LACTIC ACID, ED    EKG: EKG Interpretation Date/Time:  Saturday July 23 2024 20:27:16 EST Ventricular Rate:  60 PR Interval:    QRS Duration:  221 QT Interval:  523 QTC Calculation: 523 R Axis:   34  Text Interpretation: AV dual paced rhythm  No significant change since last tracing  Confirmed by Dreama Longs ([id number]) on 07/23/2024 8:30:43 PM  Radiology: No results found.  {Document cardiac monitor, telemetry assessment procedure when appropriate:32947} Procedures   Medications Ordered in the ED  acetaminophen  (TYLENOL ) tablet 650 mg (650 mg Oral Given 07/23/24 2005)      {Click here for ABCD2, HEART and other calculators REFRESH Note before signing:1}                              Medical Decision Making Amount and/or Complexity of Data Reviewed Radiology: ordered.   ***  {Document critical care time when appropriate  Document review of labs and clinical decision tools ie CHADS2VASC2, etc  Document your independent review of radiology images and any outside records  Document your discussion with family members, caretakers and  with consultants  Document social determinants of health affecting pt's care  Document your decision making why or why not admission, treatments were needed:32947:::1}   Final diagnoses:  None    ED Discharge Orders     None

## 2024-07-23 NOTE — H&P (Signed)
 History and Physical    Shane [name]:[id number] DOB: February 07, 1946 DOA: 07/23/2024  PCP: Shane Melanie DASEN, NP Patient coming from: Home  Chief Complaint: shortness of breath and fever  HPI: Shane [name] is a 78 y.o. male with medical history significant of combined systolic and diastolic heart failure, paroxysmal atrial fibrillation status post ablation on 07/21/2024 patient still on Eliquis  5 mg twice daily as well as amiodarone  for 100 mg.  Patient presents to the hospital 2 days after ablation daughter at bedside says that he started spiking temps in the last 2 days highest at this afternoon at 103.  States that they took over-the-counter like Tylenol  but no subsequent improvement in his symptoms he was starting to feel some shortness of breath as well.  Patient states that he did have sputum production but it was clear. Patient states that he was instructed that he became febrile or had any other symptoms after procedure that he should come directly to the hospital for further treatment and evaluation.  ED Course:  In the ER, BP 97/55, HR 61, RR 23, O2 saturation 96% on RA,  and Tmax 102.8. Cbc demonstrated wbc 11.6,  hb/hct 14.9/43.7, and platelet 195. Chemistry demonstrated Na 131, K 3.5, Cl 95, bicarb 28, Bun/Cr 20/1.22 and glucose 93. CXR demonstrated mild vascular congestion. Borderline cardiomegaly. Urinalysis was negative for evidence of infection. EKG : AV paced rhythm  Review of Systems:  All systems reviewed and apart from history of presenting illness, are negative.  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   [employer]  [facility] [employer] [phone])   Sinus node dysfunction (HCC)    Sleep  apnea    CPAP   Wears hearing aid in both ears     Past Surgical History:  Procedure Laterality Date   ATRIAL FIBRILLATION ABLATION N/A 04/03/2017   Procedure: Atrial Fibrillation Ablation;  Surgeon: Inocencio Soyla Lunger, MD;  Location: [facility] INVASIVE CV LAB;  Service: Cardiovascular;  Laterality: N/A;   ATRIAL FIBRILLATION ABLATION N/A 03/01/2021   Procedure: ATRIAL FIBRILLATION ABLATION;  Surgeon: Inocencio Soyla Lunger, MD;  Location: [facility] INVASIVE CV LAB;  Service: Cardiovascular;  Laterality: N/A;   ATRIAL FIBRILLATION ABLATION N/A 07/21/2024   Procedure: ATRIAL FIBRILLATION ABLATION;  Surgeon: Inocencio Soyla Lunger, MD;  Location: [facility] INVASIVE CV LAB;  Service: Cardiovascular;  Laterality: N/A;   CARDIAC CATHETERIZATION  10/13/1994   No CAD   CARDIAC CATHETERIZATION  05/10/2003   No CAD   CARDIOVERSION N/A 10/21/2016   Procedure: CARDIOVERSION;  Surgeon: Jerel Balding, MD;  Location: [facility] ENDOSCOPY;  Service: Cardiovascular;  Laterality: N/A;   CARDIOVERSION N/A 05/15/2017   Procedure: CARDIOVERSION;  Surgeon: Okey Vina GAILS, MD;  Location: [facility] ENDOSCOPY;  Service: Cardiovascular;  Laterality: N/A;   CARDIOVERSION N/A 10/21/2019   Procedure: CARDIOVERSION;  Surgeon: Balding Jerel, MD;  Location: [facility] ENDOSCOPY;  Service: Cardiovascular;  Laterality: N/A;   CARDIOVERSION N/A 02/24/2020   Procedure: CARDIOVERSION;  Surgeon: Pietro Redell RAMAN, MD;  Location: [facility] ENDOSCOPY;  Service: Cardiovascular;  Laterality: N/A;   CARDIOVERSION N/A 04/11/2020   Procedure: CARDIOVERSION;  Surgeon: Raford Riggs, MD;  Location: [facility] ENDOSCOPY;  Service:  Cardiovascular;  Laterality: N/A;   CARDIOVERSION N/A 12/26/2020   Procedure: CARDIOVERSION;  Surgeon: Francyne Headland, MD;  Location: [facility] ENDOSCOPY;  Service: Cardiovascular;  Laterality: N/A;   CATARACT EXTRACTION W/PHACO Left 05/18/2019   Procedure: CATARACT EXTRACTION PHACO AND INTRAOCULAR LENS PLACEMENT (IOC) LEFT  1:21 11.0% 9.01;  Surgeon: Mittie Gaskin, MD;  Location: [facility] SURGERY CNTR;  Service: Ophthalmology;  Laterality: Left;  sleep apnea   CATARACT EXTRACTION W/PHACO Right 06/29/2019   Procedure: CATARACT EXTRACTION PHACO AND INTRAOCULAR LENS PLACEMENT (IOC) RIGHT  01:14.2  14.5%  10.92;  Surgeon: Mittie Gaskin, MD;  Location: [facility] SURGERY [facility];  Service: Ophthalmology;  Laterality: Right;  sleep apnea   CYST EXCISION  03/2019   on back    EYE SURGERY     INSERT / REPLACE / REMOVE PACEMAKER     NM MYOCAR PERF WALL MOTION  05/07/2011   Lexiscan : No ishcemia   PERMANENT PACEMAKER INSERTION  03/12/2010   [employer]ant   PPM GENERATOR CHANGEOUT N/A 08/06/2020   Procedure: PPM GENERATOR CHANGEOUT;  Surgeon: Francyne Headland, MD;  Location: [facility] INVASIVE CV LAB;  Service: Cardiovascular;  Laterality: N/A;   ROTATOR CUFF REPAIR  1996   SHOULDER ARTHROSCOPY     TEE WITHOUT CARDIOVERSION N/A 03/01/2021   Procedure: TRANSESOPHAGEAL ECHOCARDIOGRAM (TEE);  Surgeon: Inocencio Soyla Lunger, MD;  Location: [facility] INVASIVE CV LAB;  Service: Cardiovascular;  Laterality: N/A;   US  ECHOCARDIOGRAPHY  05/07/2011   mod. LVH,LA mod. dilated,borderline aortic root dilatation     reports that he quit smoking about 31 years ago. His smoking use included cigars and cigarettes. He started smoking about 35 years ago. He quit smokeless tobacco use about 31 years ago.  His smokeless tobacco use included chew. He reports that he does not currently use alcohol. He reports that he does not use drugs.  No Known Allergies  Family History  Problem Relation Age of Onset   Stroke Mother    Heart failure Mother    Alzheimer's disease Father    Heart disease Sister    Dementia Sister     Prior to Admission medications   Medication Sig Start Date End Date Taking? Authorizing Provider  allopurinol  (ZYLOPRIM ) 300 MG tablet Take 1 tablet (300 mg total) by mouth daily. 10/05/19   Dolphus Reiter, MD  amiodarone  (PACERONE ) 400 MG tablet Take 1 tablet (400 mg  total) by mouth daily. 03/24/24   Croitoru, Mihai, MD  apixaban  (ELIQUIS ) 5 MG TABS tablet Take 1 tablet (5 mg total) by mouth 2 (two) times daily. 02/15/24   Croitoru, Mihai, MD  Apoaequorin (PREVAGEN PO) Take 1 tablet by mouth daily.    [provider]  Ascorbic Acid  (VITAMIN C ) 1000 MG tablet Take 1,000 mg by mouth daily.    [provider]  Cholecalciferol  (VITAMIN D -3 PO) Take 2,500 Units by mouth at bedtime.    [provider]  empagliflozin  (JARDIANCE ) 10 MG TABS tablet Take 1 tablet (10 mg total) by mouth daily before breakfast. 07/06/24   Croitoru, Mihai, MD  erythromycin ophthalmic ointment Place 1 Application into both eyes as needed. 08/14/22   [provider]  ferrous sulfate  325 (65 FE) MG EC tablet Take 1 tablet (325 mg total) by mouth 2 (two) times daily. Patient taking differently: Take 325 mg by mouth daily at 6 (six) AM. 09/21/23   Croitoru, Headland, MD  furosemide  (LASIX ) 40 MG tablet Take 1 tablet (40 mg total) by mouth daily. 02/25/24 07/21/24  Croitoru, Headland, MD  furosemide  (LASIX ) 80 MG tablet Take 1 tablet (80 mg total) by mouth daily. 09/04/23 07/18/24  Meng, Hao, PA  Multiple Vitamin (MULTIVITAMIN WITH MINERALS) TABS tablet Take 1 tablet by mouth daily. Senior    [provider]  mupirocin ointment (BACTROBAN) 2 % Apply 1 Application topically as needed. 01/27/24   [provider]  Omega-3 Fatty Acids (FISH OIL) 1200 MG CAPS Take 1,200 mg by mouth 2 (two) times daily.     [provider]  potassium chloride  SA (KLOR-CON  M) 20 MEQ tablet Take 1 tablet (20 mEq total) by mouth daily. 04/25/24   Croitoru, Mihai, MD  pyridOXINE (VITAMIN B-6) 100 MG tablet Take 100 mg by mouth daily.    [provider]  silver  sulfADIAZINE  (SILVADENE ) 1 % cream Apply 1 Application topically daily. 06/28/24   Cannady, [name], NP  testosterone  cypionate (DEPOTESTOSTERONE CYPIONATE) 200 MG/ML injection Inject 200 mg into the muscle  once a week.    [provider]  triamcinolone cream (KENALOG) 0.1 % Apply 1 Application topically as needed. 02/03/24   [provider]  vitamin B-12 (CYANOCOBALAMIN ) 1000 MCG tablet Take 1,000 mcg by mouth daily.    [provider]  Wheat Dextrin (BENEFIBER DRINK MIX PO) Take 30 mLs by mouth in the morning and at bedtime. 2 tablespoons a day    [provider]  zinc gluconate 50 MG tablet Take 50 mg by mouth daily.    [provider]    Physical Exam: Vitals:   07/23/24 1956 07/23/24 2030 07/23/24 2045 07/23/24 2130  BP:  (!) 117/54  (!) 99/54  Pulse:  (!) 59  61  Resp:  20  (!) 21  Temp:   (!) 100.6 F (38.1 C)   TempSrc:   Oral   SpO2:  96%  97%  Weight: 98.4 kg     Height: 5' 11 (1.803 m)       Physical Exam Constitutional:      General: He is not in acute distress.    Appearance: Normal appearance.  HENT:     Head: Normocephalic and atraumatic.  Eyes:     Extraocular Movements: Extraocular movements intact.     Conjunctiva/sclera: Conjunctivae normal.     Pupils: Pupils are equal, round, and reactive to light.  Cardiovascular:     Rate and Rhythm: Normal rate and regular rhythm.     Pulses: Normal pulses.     Heart sounds: Normal heart sounds.  Pulmonary:     Effort: Pulmonary effort is normal. No respiratory distress.     Breath sounds: Normal breath sounds. No wheezing, rhonchi or rales.  Abdominal:     General: Abdomen is flat. Bowel sounds are normal. There is no distension.     Palpations: Abdomen is soft.     Tenderness: There is no abdominal tenderness.  Musculoskeletal:        General: No deformity. Normal range of motion.  Skin:    General: Skin is warm and dry.     Coloration: Skin is not jaundiced.  Neurological:     General: No focal deficit present.     Mental Status: He is alert and oriented to person, place, and time. Mental status is at baseline.   Labs on Admission: I have personally reviewed  following labs and imaging studies  CBC: Recent Labs  Lab 07/23/24 2013  WBC 11.6*  NEUTROABS 9.7*  HGB 14.9  HCT 43.7  MCV 98.6  PLT 195   Basic  Metabolic Panel: Recent Labs  Lab 07/23/24 2013  NA 131*  K 3.5  CL 95*  CO2 28  GLUCOSE 93  BUN 20  CREATININE 1.22  CALCIUM 8.4*   GFR: Estimated Creatinine Clearance: 58.7 mL/min (by C-G formula based on SCr of 1.22 mg/dL). Liver Function Tests: Recent Labs  Lab 07/23/24 2013  AST 32  ALT 41  ALKPHOS 81  BILITOT 1.6*  PROT 6.7  ALBUMIN 3.0*   No results for input(s): LIPASE, AMYLASE in the last 168 hours. No results for input(s): AMMONIA in the last 168 hours. Coagulation Profile: Recent Labs  Lab 07/23/24 2013  INR 1.4*   Cardiac Enzymes: No results for input(s): CKTOTAL, CKMB, CKMBINDEX, TROPONINI in the last 168 hours. BNP (last 3 results) No results for input(s): PROBNP in the last 8760 hours. HbA1C: No results for input(s): HGBA1C in the last 72 hours. CBG: No results for input(s): GLUCAP in the last 168 hours. Lipid Profile: No results for input(s): CHOL, HDL, LDLCALC, TRIG, CHOLHDL, LDLDIRECT in the last 72 hours. Thyroid  Function Tests: No results for input(s): TSH, T4TOTAL, FREET4, T3FREE, THYROIDAB in the last 72 hours. Anemia Panel: No results for input(s): VITAMINB12, FOLATE, FERRITIN, TIBC, IRON, RETICCTPCT in the last 72 hours. Urine analysis:    Component Value Date/Time   COLORURINE AMBER (A) 07/23/2024 2016   APPEARANCEUR CLEAR 07/23/2024 2016   LABSPEC 1.020 07/23/2024 2016   PHURINE 5.0 07/23/2024 2016   GLUCOSEU >=500 (A) 07/23/2024 2016   HGBUR MODERATE (A) 07/23/2024 2016   BILIRUBINUR NEGATIVE 07/23/2024 2016   KETONESUR NEGATIVE 07/23/2024 2016   PROTEINUR 30 (A) 07/23/2024 2016   NITRITE NEGATIVE 07/23/2024 2016   LEUKOCYTESUR NEGATIVE 07/23/2024 2016    Radiological Exams on Admission: DG Chest Portable 1  View Result Date: 07/23/2024 EXAM: 1 VIEW(S) XRAY OF THE CHEST 07/23/2024 09:03:00 PM COMPARISON: 05/24/2024 CLINICAL HISTORY: SOB SOB FINDINGS: LINES, TUBES AND DEVICES: Left pacer remains in place, unchanged. LUNGS AND PLEURA: Mild vascular congestion. No pleural effusion. No pneumothorax. HEART AND MEDIASTINUM: Heart is borderline in size. No acute abnormality of the mediastinal silhouette. BONES AND SOFT TISSUES: No acute osseous abnormality. IMPRESSION: 1. Mild vascular congestion. 2. Borderline cardiomegaly. Electronically signed by: Franky Crease MD 07/23/2024 09:07 PM EST RP Workstation: [id number]    EKG: Independently reviewed.   Assessment/Plan Principal Problem:   Aspiration pneumonia (HCC) Active Problems:   Sepsis (HCC)   Chronic combined systolic and diastolic heart failure (HCC)   OSA (obstructive sleep apnea)   Status post catheter ablation of atrial fibrillation   Presence of cardiac pacemaker   SSS (sick sinus syndrome) (HCC)   Secondary hypercoagulable state    Aspiration pneumonia Patient has been started on IV zosyn  Blood cultures have been ordered Will obtain procalcitonin level Patient not currently requiring breathing treatments at this time  Sepsis Source of infection; likely secondary to aspiration pneumonia after procedure Blood cultures have been ordered at this time Imaging: CXR did demonstrated some pulmonary congestion  IV antibiotics started Patient will receive some IV normal saline Initial lactic acid was 0.8 and repeat was 0.6 Patient is borderline hypotensive and therefore will continue fluids at this time   Chronic combined systolic and diastolic heart failure BNP is elevated at 569 Patient did have venous congestion demonstrated on imaging However he is currently hypotensive and likely septic Will continue fluids and this time and monitor Will hold off on any diuretics at this time   OSA Patient wears cpap at home Will  consider  continuing at this time  Atrial fibrillation s/p ablation  Procedure was performed on 07/21/24 Will place patient on telemetry and continue to monitor closely  Continue amiodarone  400 mg daily  Continue eliquis  5 mg BID at this time   Iron deficiency anemia Patient has been placed on ferrous sulfate  and will continue at this time  DVT prophylaxis: eliquis    Code Status:full  Family Communication:     Decoste,Amy (Spouse) [phone] (Mobile)   Disposition Plan:  Patient class is not currently inpatient or observation. Update patient class prior to completing documentation    Consults called: none Admission status: inpatient Level of care: Level of care:  The medical decision making on this patient was of high complexity and the patient is at high risk for clinical deterioration, therefore this is a level 3 visit.  The medical decision making is of moderate complexity, therefore this is a level 2 visit.  Bradly MARLA Drones MD [facility]  If 7PM-7AM, please contact night-coverage [email or web address]  07/23/2024, 10:31 PM

## 2024-07-23 NOTE — ED Triage Notes (Signed)
 Patient had a cardiac ablation 2 days ago. He is in ED today with complaints of fever greater than 102. He's shob and dizzy. Cardiology told them to come to ED to check for an infection. Denies chest pain, puncture sites are fine.

## 2024-07-24 DIAGNOSIS — I2489 Other forms of acute ischemic heart disease: Secondary | ICD-10-CM | POA: Diagnosis present

## 2024-07-24 DIAGNOSIS — Z9841 Cataract extraction status, right eye: Secondary | ICD-10-CM | POA: Diagnosis not present

## 2024-07-24 DIAGNOSIS — Z823 Family history of stroke: Secondary | ICD-10-CM | POA: Diagnosis not present

## 2024-07-24 DIAGNOSIS — Z9889 Other specified postprocedural states: Secondary | ICD-10-CM | POA: Diagnosis not present

## 2024-07-24 DIAGNOSIS — J189 Pneumonia, unspecified organism: Secondary | ICD-10-CM

## 2024-07-24 DIAGNOSIS — Z7984 Long term (current) use of oral hypoglycemic drugs: Secondary | ICD-10-CM | POA: Diagnosis not present

## 2024-07-24 DIAGNOSIS — I495 Sick sinus syndrome: Secondary | ICD-10-CM | POA: Diagnosis present

## 2024-07-24 DIAGNOSIS — D509 Iron deficiency anemia, unspecified: Secondary | ICD-10-CM | POA: Diagnosis present

## 2024-07-24 DIAGNOSIS — I483 Typical atrial flutter: Secondary | ICD-10-CM | POA: Diagnosis present

## 2024-07-24 DIAGNOSIS — Z8249 Family history of ischemic heart disease and other diseases of the circulatory system: Secondary | ICD-10-CM | POA: Diagnosis not present

## 2024-07-24 DIAGNOSIS — Z87891 Personal history of nicotine dependence: Secondary | ICD-10-CM | POA: Diagnosis not present

## 2024-07-24 DIAGNOSIS — G4733 Obstructive sleep apnea (adult) (pediatric): Secondary | ICD-10-CM | POA: Diagnosis present

## 2024-07-24 DIAGNOSIS — E669 Obesity, unspecified: Secondary | ICD-10-CM | POA: Diagnosis present

## 2024-07-24 DIAGNOSIS — Z9842 Cataract extraction status, left eye: Secondary | ICD-10-CM | POA: Diagnosis not present

## 2024-07-24 DIAGNOSIS — Z950 Presence of cardiac pacemaker: Secondary | ICD-10-CM | POA: Diagnosis not present

## 2024-07-24 DIAGNOSIS — D6869 Other thrombophilia: Secondary | ICD-10-CM | POA: Diagnosis present

## 2024-07-24 DIAGNOSIS — Z7901 Long term (current) use of anticoagulants: Secondary | ICD-10-CM | POA: Diagnosis not present

## 2024-07-24 DIAGNOSIS — J690 Pneumonitis due to inhalation of food and vomit: Secondary | ICD-10-CM | POA: Diagnosis present

## 2024-07-24 DIAGNOSIS — Z961 Presence of intraocular lens: Secondary | ICD-10-CM | POA: Diagnosis present

## 2024-07-24 DIAGNOSIS — Z79899 Other long term (current) drug therapy: Secondary | ICD-10-CM | POA: Diagnosis not present

## 2024-07-24 DIAGNOSIS — I77810 Thoracic aortic ectasia: Secondary | ICD-10-CM | POA: Diagnosis present

## 2024-07-24 DIAGNOSIS — E876 Hypokalemia: Secondary | ICD-10-CM | POA: Diagnosis present

## 2024-07-24 DIAGNOSIS — A419 Sepsis, unspecified organism: Secondary | ICD-10-CM | POA: Diagnosis present

## 2024-07-24 DIAGNOSIS — I5042 Chronic combined systolic (congestive) and diastolic (congestive) heart failure: Secondary | ICD-10-CM | POA: Diagnosis present

## 2024-07-24 DIAGNOSIS — I4819 Other persistent atrial fibrillation: Secondary | ICD-10-CM

## 2024-07-24 LAB — COMPREHENSIVE METABOLIC PANEL WITH GFR
ALT: 35 U/L (ref 0–44)
AST: 27 U/L (ref 15–41)
Albumin: 2.7 g/dL — ABNORMAL LOW (ref 3.5–5.0)
Alkaline Phosphatase: 72 U/L (ref 38–126)
Anion gap: 10 (ref 5–15)
BUN: 19 mg/dL (ref 8–23)
CO2: 27 mmol/L (ref 22–32)
Calcium: 8.3 mg/dL — ABNORMAL LOW (ref 8.9–10.3)
Chloride: 99 mmol/L (ref 98–111)
Creatinine, Ser: 1.11 mg/dL (ref 0.61–1.24)
GFR, Estimated: >60 mL/min (ref >60)
Glucose, Bld: 99 mg/dL (ref 70–99)
Potassium: 3.2 mmol/L — ABNORMAL LOW (ref 3.5–5.1)
Sodium: 136 mmol/L (ref 135–145)
Total Bilirubin: 1.6 mg/dL — ABNORMAL HIGH (ref 0.0–1.2)
Total Protein: 6 g/dL — ABNORMAL LOW (ref 6.5–8.1)

## 2024-07-24 LAB — CBC WITH DIFFERENTIAL/PLATELET
Abs Immature Granulocytes: 0.07 K/uL (ref 0.00–0.07)
Basophils Absolute: 0.1 K/uL (ref 0.0–0.1)
Basophils Relative: 1 %
Eosinophils Absolute: 0.2 K/uL (ref 0.0–0.5)
Eosinophils Relative: 1 %
HCT: 40.4 % (ref 39.0–52.0)
Hemoglobin: 13.6 g/dL (ref 13.0–17.0)
Immature Granulocytes: 1 %
Lymphocytes Relative: 12 %
Lymphs Abs: 1.4 K/uL (ref 0.7–4.0)
MCH: 33.6 pg (ref 26.0–34.0)
MCHC: 33.7 g/dL (ref 30.0–36.0)
MCV: 99.8 fL (ref 80.0–100.0)
Monocytes Absolute: 1 K/uL (ref 0.1–1.0)
Monocytes Relative: 9 %
Neutro Abs: 8.9 K/uL — ABNORMAL HIGH (ref 1.7–7.7)
Neutrophils Relative %: 76 %
Platelets: 171 K/uL (ref 150–400)
RBC: 4.05 MIL/uL — ABNORMAL LOW (ref 4.22–5.81)
RDW: 14.4 % (ref 11.5–15.5)
WBC: 11.6 K/uL — ABNORMAL HIGH (ref 4.0–10.5)
nRBC: 0 % (ref 0.0–0.2)

## 2024-07-24 LAB — PROTIME-INR
INR: 1.5 — ABNORMAL HIGH (ref 0.8–1.2)
Prothrombin Time: 19.2 s — ABNORMAL HIGH (ref 11.4–15.2)

## 2024-07-24 LAB — TROPONIN I (HIGH SENSITIVITY): Troponin I (High Sensitivity): 125 ng/L (ref <18)

## 2024-07-24 MED ORDER — ACETAMINOPHEN 325 MG PO TABS
ORAL_TABLET | ORAL | Status: AC
Start: 2024-07-24 — End: 2024-07-24
  Administered 2024-07-24: 650 mg
  Filled 2024-07-24: qty 2

## 2024-07-24 MED ORDER — FERROUS SULFATE 325 (65 FE) MG PO TABS
325.0000 mg | ORAL_TABLET | Freq: Every day | ORAL | Status: DC
  Administered 2024-07-24 – 2024-07-27 (×4): 325 mg via ORAL
  Filled 2024-07-24 (×5): qty 1

## 2024-07-24 MED ORDER — VITAMIN B-12 1000 MCG PO TABS
1000.0000 ug | ORAL_TABLET | Freq: Every day | ORAL | Status: DC
  Administered 2024-07-24 – 2024-07-27 (×4): 1000 ug via ORAL
  Filled 2024-07-24 (×4): qty 1

## 2024-07-24 MED ORDER — AMIODARONE HCL 200 MG PO TABS
400.0000 mg | ORAL_TABLET | Freq: Every day | ORAL | Status: DC
  Administered 2024-07-24 – 2024-07-27 (×4): 400 mg via ORAL
  Filled 2024-07-24 (×5): qty 2

## 2024-07-24 MED ORDER — ACETAMINOPHEN 325 MG PO TABS
650.0000 mg | ORAL_TABLET | Freq: Four times a day (QID) | ORAL | Status: DC | PRN
  Administered 2024-07-24 – 2024-07-25 (×3): 650 mg via ORAL
  Filled 2024-07-24 (×3): qty 2

## 2024-07-24 MED ORDER — POTASSIUM CHLORIDE 20 MEQ PO PACK
40.0000 meq | PACK | Freq: Once | ORAL | Status: AC
  Administered 2024-07-24: 40 meq via ORAL
  Filled 2024-07-24: qty 2

## 2024-07-24 MED ORDER — APIXABAN 5 MG PO TABS
5.0000 mg | ORAL_TABLET | Freq: Two times a day (BID) | ORAL | Status: DC
  Administered 2024-07-24 – 2024-07-27 (×7): 5 mg via ORAL
  Filled 2024-07-24 (×7): qty 1

## 2024-07-24 MED ORDER — ALLOPURINOL 300 MG PO TABS
300.0000 mg | ORAL_TABLET | Freq: Every day | ORAL | Status: DC
  Administered 2024-07-24 – 2024-07-27 (×4): 300 mg via ORAL
  Filled 2024-07-24 (×3): qty 1
  Filled 2024-07-24: qty 3

## 2024-07-24 NOTE — Consult Note (Signed)
 Cardiology Consultation:   Patient ID: Shane [name] MRN: [id number]; DOB: 15-Oct-1945  Admit date: 07/23/2024 Date of Consult: 07/24/2024  Primary Care Provider: Valerio Melanie DASEN, NP [facility] Cardiologist: Shane Balding, MD  [facility] Electrophysiologist:  Shane Gladis Norton, MD   Patient Profile:  Shane [name] is a 78 y.o. male with persistent AF, SSS, and OSA who presents with fever following recent AF ablation.  History of Present Illness:   Shane [name] on 05/03/2024 by Dr. Norton with recurrent AF/AT with associated symptoms.  With persistent AF/atypical flutter and atrial tachycardia repeat EP study and ablation was planned.  He had prior ablation in 2018 and 2022.  He has a BSCI PPM for SND.   He underwent acutely successful PFA PVI with RF mitral line and RF CTI on 07/21/2024.  Today presents to clinic after having chills and febrile.  Blood pressure stable 110/58 on ED admission and maintaining normal sinus rhythm with heart rate 60.  Febrile to 38.3.  Labs notable for mild troponin elevation (155->125) following recent ablation.  Mild leukocytosis that was stable from prior.  Lactic acid normal.  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   guidant  [facility] [phone])   Sinus node dysfunction (HCC)    Sleep apnea    CPAP   Wears hearing aid in both ears    Past Surgical History:  Procedure Laterality Date   ATRIAL FIBRILLATION ABLATION N/A 04/03/2017   Procedure: Atrial Fibrillation Ablation;  Surgeon: [name] Shane Gladis, MD;  Location: [facility] INVASIVE CV LAB;  Service: Cardiovascular;  Laterality: N/A;   ATRIAL FIBRILLATION ABLATION N/A 03/01/2021   Procedure:  ATRIAL FIBRILLATION ABLATION;  Surgeon: [name] Shane Gladis, MD;  Location: [facility] INVASIVE CV LAB;  Service: Cardiovascular;  Laterality: N/A;   ATRIAL FIBRILLATION ABLATION N/A 07/21/2024   Procedure: ATRIAL FIBRILLATION ABLATION;  Surgeon: [name] Shane Gladis, MD;  Location: [facility] INVASIVE CV LAB;  Service: Cardiovascular;  Laterality: N/A;   CARDIAC CATHETERIZATION  10/13/1994   No CAD   CARDIAC CATHETERIZATION  05/10/2003   No CAD   CARDIOVERSION N/A 10/21/2016   Procedure: CARDIOVERSION;  Surgeon: Shane Balding, MD;  Location: [facility] ENDOSCOPY;  Service: Cardiovascular;  Laterality: N/A;   CARDIOVERSION N/A 05/15/2017   Procedure: CARDIOVERSION;  Surgeon: Okey Vina GAILS, MD;  Location: [facility] ENDOSCOPY;  Service: Cardiovascular;  Laterality: N/A;   CARDIOVERSION N/A 10/21/2019   Procedure: CARDIOVERSION;  Surgeon: [name] Jerel, MD;  Location: [facility] ENDOSCOPY;  Service: Cardiovascular;  Laterality: N/A;   CARDIOVERSION N/A 02/24/2020   Procedure: CARDIOVERSION;  Surgeon: Pietro Redell RAMAN, MD;  Location: [facility] ENDOSCOPY;  Service: Cardiovascular;  Laterality: N/A;   CARDIOVERSION N/A 04/11/2020   Procedure: CARDIOVERSION;  Surgeon: Raford Riggs, MD;  Location: [facility] ENDOSCOPY;  Service: Cardiovascular;  Laterality: N/A;   CARDIOVERSION N/A 12/26/2020   Procedure: CARDIOVERSION;  Surgeon: [name] Jerel, MD;  Location: [facility] ENDOSCOPY;  Service: Cardiovascular;  Laterality: N/A;   CATARACT EXTRACTION W/PHACO Left 05/18/2019   Procedure: CATARACT EXTRACTION PHACO AND INTRAOCULAR LENS PLACEMENT (IOC) LEFT  1:21 11.0% 9.01;  Surgeon: Shane Gaskin, MD;  Location: [facility] SURGERY CNTR;  Service: Ophthalmology;  Laterality: Left;  sleep apnea   CATARACT EXTRACTION W/PHACO Right 06/29/2019   Procedure: CATARACT EXTRACTION PHACO AND INTRAOCULAR LENS PLACEMENT (IOC) RIGHT  01:14.2  14.5%  10.92;  Surgeon: Shane Gaskin, MD;  Location: [facility] SURGERY CNTR;  Service: Ophthalmology;  Laterality: Right;   sleep apnea   CYST EXCISION  03/2019   on back    EYE SURGERY     INSERT / REPLACE / REMOVE PACEMAKER     NM MYOCAR PERF WALL MOTION  05/07/2011   Lexiscan : No ishcemia   PERMANENT PACEMAKER INSERTION  03/12/2010   guidant   PPM GENERATOR CHANGEOUT N/A 08/06/2020   Procedure: PPM GENERATOR CHANGEOUT;  Surgeon: Francyne Headland, MD;  Location: [facility] INVASIVE CV LAB;  Service: Cardiovascular;  Laterality: N/A;   ROTATOR CUFF REPAIR  1996   SHOULDER ARTHROSCOPY     TEE WITHOUT CARDIOVERSION N/A 03/01/2021   Procedure: TRANSESOPHAGEAL ECHOCARDIOGRAM (TEE);  Surgeon: Shane Shane Lunger, MD;  Location: [facility] INVASIVE CV LAB;  Service: Cardiovascular;  Laterality: N/A;   US  ECHOCARDIOGRAPHY  05/07/2011   mod. LVH,LA mod. dilated,borderline aortic root dilatation    Inpatient Medications: Scheduled Meds:  allopurinol   300 mg Oral Daily   amiodarone   400 mg Oral Daily   apixaban   5 mg Oral BID   cyanocobalamin   1,000 mcg Oral Daily   ferrous sulfate   325 mg Oral Q0600   Continuous Infusions:  sodium chloride      piperacillin -tazobactam 3.375 g (07/24/24 1723)   PRN Meds: acetaminophen   Allergies:   No Known Allergies  Social History:   Social History   Socioeconomic History   Marital status: Married    Spouse name: Not on file   Number of children: Not on file   Years of education: Not on file   Highest education level: Associate degree: occupational, scientist, product/process development, or vocational program  Occupational History   Not on file  Tobacco Use   Smoking status: Former    Current packs/day: 0.00    Types: Cigars, Cigarettes    Start date: 36    Quit date: 05/11/1993    Years since quitting: 31.2   Smokeless tobacco: Former    Types: Chew    Quit date: 05/11/1993   Tobacco comments:    Former smoker 04/29/21  Vaping Use   Vaping status: Never Used  Substance and Sexual Activity   Alcohol use: Not Currently    Comment: social   Drug use: No   Sexual activity: Not Currently     Birth control/protection: Abstinence, Surgical  Other Topics Concern   Not on file  Social History Narrative   Not on file   Social Drivers of Health   Financial Resource Strain: Low Risk  (06/24/2024)   Overall Financial Resource Strain (CARDIA)    Difficulty of Paying Living Expenses: Not hard at all  Food Insecurity: No Food Insecurity (07/24/2024)   Hunger Vital Sign    Worried About Running Out of Food in the Last Year: Never true    Ran Out of Food in the Last Year: Never true  Transportation Needs: No Transportation Needs (07/24/2024)   PRAPARE - Administrator, Civil Service (Medical): No    Lack of Transportation (Non-Medical): No  Physical Activity: Inactive (06/24/2024)   Exercise Vital Sign    Days of Exercise per Week: 0 days    Minutes of Exercise per Session: Not on file  Stress: No Stress Concern Present (06/24/2024)   [employer] of Occupational Health -  Occupational Stress Questionnaire    Feeling of Stress: Not at all  Social Connections: Moderately Integrated (07/24/2024)   Social Connection and Isolation Panel    Frequency of Communication with Friends and Family: More than three times a week    Frequency of Social Gatherings with Friends and Family: Twice a week    Attends Religious Services: More than 4 times per year    Active Member of [employer] or Organizations: No    Attends Banker Meetings: Never    Marital Status: Married  Catering Manager Violence: Not At Risk (07/24/2024)   Humiliation, Afraid, Rape, and Kick questionnaire    Fear of Current or Ex-Partner: No    Emotionally Abused: No    Physically Abused: No    Sexually Abused: No    Family History:    Family History  Problem Relation Age of Onset   Stroke Mother    Heart failure Mother    Alzheimer's disease Father    Heart disease Sister    Dementia Sister     ROS:  Review of Systems: [y] = yes, [ ]  = no      General: Weight gain [ ] ; Weight loss [ ] ;  Anorexia [ ] ; Fatigue [ ] ; Fever [y]; Chills [y]; Weakness [ ]    Cardiac: Chest pain/pressure [ ] ; Resting SOB [ ] ; Exertional SOB [ ] ; Orthopnea [ ] ; Pedal Edema [ ] ; Palpitations [ ] ; Syncope [ ] ; Presyncope [ ] ; Paroxysmal nocturnal dyspnea [ ]    Pulmonary: Cough [ ] ; Wheezing [ ] ; Hemoptysis [ ] ; Sputum [ ] ; Snoring [ ]    GI: Vomiting [ ] ; Dysphagia [ ] ; Melena [ ] ; Hematochezia [ ] ; Heartburn [ ] ; Abdominal pain [ ] ; Constipation [ ] ; Diarrhea [ ] ; BRBPR [ ]    GU: Hematuria [ ] ; Dysuria [ ] ; Nocturia [ ]  Vascular: Pain in legs with walking [ ] ; Pain in feet with lying flat [ ] ; Non-healing sores [ ] ; Stroke [ ] ; TIA [ ] ; Slurred speech [ ] ;   Neuro: Headaches [ ] ; Vertigo [ ] ; Seizures [ ] ; Paresthesias [ ] ;Blurred vision [ ] ; Diplopia [ ] ; Vision changes [ ]    Ortho/Skin: Arthritis [ ] ; Joint pain [ ] ; Muscle pain [ ] ; Joint swelling [ ] ; Back Pain [ ] ; Rash [ ]    Psych: Depression [ ] ; Anxiety [ ]    Heme: Bleeding problems [ ] ; Clotting disorders [ ] ; Anemia [ ]    Endocrine: Diabetes [ ] ; Thyroid  dysfunction [ ]    Physical Exam/Data:   Vitals:   07/24/24 1245 07/24/24 1330 07/24/24 1430 07/24/24 1502  BP: (!) 100/57 96/67 113/81 121/67  Pulse: (!) 59 61 60 64  Resp: 17 19 20 20   Temp:    99.1 F (37.3 C)  TempSrc:    Oral  SpO2: 93% 100% 99% 94%  Weight:    103 kg  Height:    5' 11 (1.803 m)    Intake/Output Summary (Last 24 hours) at 07/24/2024 2000 Last data filed at 07/24/2024 1622 Gross per 24 hour  Intake 3183.87 ml  Output --  Net 3183.87 ml      07/24/2024    3:02 PM 07/23/2024    7:56 PM 07/21/2024    6:03 AM  Last 3 Weights  Weight (lbs) 227 lb 1.2 oz 216 lb 14.9 oz 217 lb  Weight (kg) 103 kg 98.4 kg 98.431 kg     Body mass index is 31.67 kg/m.  General:  Well nourished, well developed, in no acute distress HEENT: normal Cardiac:  normal S1, S2; RRR; no murmur  Lungs: b/l basilar rales  Abd: soft, nontender, no hepatomegaly  Ext: no  edema Musculoskeletal:  No deformities, BUE and BLE strength normal and equal Skin: warm and dry, b/l groin access with minimal ecchymosis but no bleeding or hematoma  Neuro:  CNs 2-12 intact, no focal abnormalities noted Psych:  Normal affect   EKG:  The EKG (07/23/24) was personally reviewed and demonstrates:  APVP 60, QRS 221, QT/c 523/523 Telemetry:  Telemetry was personally reviewed and demonstrates: NSR   Relevant CV Studies: None   Laboratory Data:  High Sensitivity Troponin:   Recent Labs  Lab 07/23/24 2118 07/24/24 0126  TROPONINIHS 155* 125*     Chemistry Recent Labs  Lab 07/23/24 2013 07/24/24 0126  NA 131* 136  K 3.5 3.2*  CL 95* 99  CO2 28 27  GLUCOSE 93 99  BUN 20 19  CREATININE 1.22 1.11  CALCIUM 8.4* 8.3*  GFRNONAA >60 >60  ANIONGAP 8 10    Recent Labs  Lab 07/23/24 2013 07/24/24 0126  PROT 6.7 6.0*  ALBUMIN 3.0* 2.7*  AST 32 27  ALT 41 35  ALKPHOS 81 72  BILITOT 1.6* 1.6*   Hematology Recent Labs  Lab 07/23/24 2013 07/24/24 0126  WBC 11.6* 11.6*  RBC 4.43 4.05*  HGB 14.9 13.6  HCT 43.7 40.4  MCV 98.6 99.8  MCH 33.6 33.6  MCHC 34.1 33.7  RDW 14.5 14.4  PLT 195 171   BNP Recent Labs  Lab 07/23/24 2118  BNP 569.7*    Radiology/Studies:  CT CHEST WO CONTRAST Result Date: 07/23/2024 EXAM: CT CHEST WITHOUT CONTRAST 07/23/2024 11:31:30 PM TECHNIQUE: CT of the chest was performed without the administration of intravenous contrast. Multiplanar reformatted images are provided for review. Automated exposure control, iterative reconstruction, and/or weight based adjustment of the mA/kV was utilized to reduce the radiation dose to as low as reasonably achievable. COMPARISON: Chest x-ray today. CLINICAL HISTORY: Respiratory illness, nondiagnostic xray. FINDINGS: MEDIASTINUM: Pacer wires in the right heart. Heart and pericardium are otherwise unremarkable. The central airways are clear. LYMPH NODES: Mildly enlarged mediastinal lymph nodes.  Right paratracheal lymph node with a short axis diameter of 13 mm. Prevascular lymph node with a short-axis diameter of 11 mm. Subcarinal lymph node with a short axis diameter of 13 mm. No axillary adenopathy. LUNGS AND PLEURA: Airspace opacities medially in the right lower lobe and posteriorly in the left lower lobe concerning for pneumonia. Several nodules along the left minor fissure are stable, likely postinflammatory. No pleural effusion or pneumothorax. SOFT TISSUES/BONES: No acute abnormality of the bones or soft tissues. UPPER ABDOMEN: Limited images of the upper abdomen demonstrate gallstones within the gallbladder measuring up to 3 cm. IMPRESSION: 1. Airspace opacities in the right lower lobe medially and left lower lobe posteriorly, concerning for pneumonia. 2. Mild mediastinal lymphadenopathy involving right paratracheal, prevascular, and subcarinal stations, which may be reactive. Electronically signed by: Franky Crease MD 07/23/2024 11:41 PM EST RP Workstation: [id number]   DG Chest Portable 1 View Result Date: 07/23/2024 EXAM: 1 VIEW(S) XRAY OF THE CHEST 07/23/2024 09:03:00 PM COMPARISON: 05/24/2024 CLINICAL HISTORY: SOB SOB FINDINGS: LINES, TUBES AND DEVICES: Left pacer remains in place, unchanged. LUNGS AND PLEURA: Mild vascular congestion. No pleural effusion. No pneumothorax. HEART AND MEDIASTINUM: Heart is borderline in size. No acute abnormality of the mediastinal silhouette. BONES AND SOFT TISSUES: No acute osseous abnormality. IMPRESSION: 1.  Mild vascular congestion. 2. Borderline cardiomegaly. Electronically signed by: Franky Crease MD 07/23/2024 09:07 PM EST RP Workstation: [id number]   EP STUDY Result Date: 07/21/2024 SURGEON:  Shane Norton, MD PREPROCEDURE DIAGNOSES: 1. Persistent atrial fibrillation. 2. Typical atrial flutter POSTPROCEDURE DIAGNOSES: 1. Persistent atrial fibrillation. 2. Typical atrial flutter PROCEDURES: 1. Comprehensive electrophysiologic study. 2. Coronary sinus  pacing and recording. 3. Three-dimensional mapping of atrial fibrillation with additional mapping and ablation of a second discrete focus 4. Ablation of atrial fibrillation with additional mapping and ablation of a second discrete focus 5. Intracardiac echocardiography. 6. Transseptal puncture of an intact septum. INTRODUCTION:  GRAYLAND DAISEY is a 78 y.o. male with a history of persistent atrial fibrillation who now presents for EP study and radiofrequency ablation.  He has had prior ablation but has had more episodes of atrial fibrillation/tachycardia/flutter.  The patient therefore presents today for catheter ablation of atrial fibrillation. DESCRIPTION OF PROCEDURE:  Informed written consent was obtained, and the patient was brought to the electrophysiology lab in a fasting state.  The patient was adequately sedated with intravenous medications as outlined in the anesthesia report.  The patient's left and right groins were prepped and draped in the usual sterile fashion by the EP lab staff.  Using a percutaneous Seldinger technique, 1 8-French hemostasis sheaths were placed in the right femoral vein, and one 7 French and one 9-French hemostasis sheaths were placed into the left common femoral vein. The right femoral vein sheath site was preclosed using an Abbott Perclose and closed at the end of the case. A Biosense Webster Octarray catheter was advanced into the atrium to make a right atrial map. Direct ultrasound guidance is used for right and left femoral veins with normal vessel patency. Ultrasound images are captured and stored in the patient's chart. Using ultrasound guidance, the Brockenbrough needle and wire were visualized entering the vessel. Catheter Placement:  A 7-French Biosense Webster Decapolar coronary sinus catheter was introduced through the right common femoral vein and advanced into the coronary sinus for recording and pacing from this location.  Initial Measurements: The patient presented  to the electrophysiology lab in AV paced rhythm.  Intracardiac Echocardiography: An 8-French Biosense Webster AcuNav intracardiac echocardiography catheter was introduced through the right common femoral vein and advanced into the right atrium. Intracardiac echocardiography was performed of the left atrium, and a three-dimensional anatomical rendering of the left atrium was performed using CARTO sound technology.  The patient was noted to have a moderate sized left atrium.  The interatrial septum was prominent but not aneurysmal. All 4 pulmonary veins were visualized and noted to have separate ostia.  The pulmonary veins were moderate in size.  The left atrial appendage was visualized and did not reveal thrombus.   There was no evidence of pulmonary vein stenosis. Transseptal Puncture: The right common femoral vein sheath was exchanged for one Visigo sheath and transseptal access was achieved in a standard fashion using a Bayless pigtail wire under fluoroscopy with intracardiac echocardiography confirmation of the transseptal puncture.  Once transseptal access had been achieved, heparin  was administered intravenously and intra- arterially in order to maintain an ACT of greater than 350 seconds throughout the procedure. 3D Mapping and Ablation:  A  Biosense Webster Octarray catheter was advanced into the left atrium through the Visigo sheath.  The Octarray mapping catheter was introduced through the transseptal sheath and positioned over the mouth of all 4 pulmonary veins.  Three-dimensional electroanatomical mapping was performed using CARTO technology.  This demonstrated  electrical activity within the carina of the left pulmonary veins and the posterior wall at baseline.  The Varipulse catheter was then placed into the left atrium.  The patient underwent successful sequential electrical isolation of all four pulmonary veins using pulsed field energy with 7 total pulses delivered.  Entrance and exit block were  confirmed. Due to persistence of atrial fibrillation and risk of left atrial flutter, ablation using PFA was performed across the posterior wall. The Farawave catheter was removed from the body and the pentarray catheter was introduced into the left atrium. 3D mapping was performed which confirmed electrical isolation of the pulmonary veins and posterior wall. The patient had prior ablaiton from the right superior pulmonary vein to the mitral valve. There was conduction through that ablation line. Using a [employer] DF ST/SF catheter, ablation was performed from the right superior pulmonary vein to the mitral valve. Conduction time across this line measured 200 msec. The sheath was then pulled back into the right atrial and positioned along the cavo-tricuspid isthmus.  A Biosense Webster DF ST/SF ablation catheter was placed through the sheath into the right atrium. Mapping along the atrial side of the isthmus was performed.  This demonstrated a standard isthmus.  A series of radiofrequency applications were then delivered along the isthmus.  Complete bidirectional cavotricuspid isthmus block was achieved as confirmed by differential atrial pacing from the low lateral right atrium.  A stimulus to earliest atrial activation across the isthmus measured 289 msec bi-directionally.  The patient was observe without return of conduction through the isthmus. Measurements Following Ablation: In sinus rhythm with RR interval was 1003 msec, with PR 150 msec, QRS 254 msec, and QT 517 msec.  Pacing down to 500/200 did not induce any arrhythmias. No further measurements were done due to the presence of the pacemaker and complete AV block.  Electroisolation was then again confirmed in all four pulmonary veins.  Pacing was performed along the ablation line which confirmed entrance and exit block. The procedure was therefore considered completed.  All catheters were removed, and the sheaths were aspirated and flushed.  40 mg  protamine  was given to reverse heparin .  The right femoral vein sheath was removed and closed using a Perclose device.  The left femoral vein sheaths were closed using the Mynx closure device and the sheaths were removed. The patient was transferred to the recovery area.  Intracardiac echocardiogram revealed no pericardial effusion.  There were no early apparent complications. CONCLUSIONS: 1. Sinus rhythm upon presentation.  2. Successful electrical isolation and anatomical encircling of all four pulmonary veins with pulsed field ablation. 3. Posterior wall isolation using pulse field ablation 4. Cavotricuspid isthmus ablation 5. Ablation from the right upper pulmonary vein to the mitral valve 6. No early apparent complications. Shane Gladis Camnitz,MD 9:14 AM 07/21/2024   Assessment and Plan:  YANCEY PEDLEY is a 78 y.o. male with persistent AF, SSS, and OSA who presents with fever following recent AF ablation.  Febrile following PVI  Aspiration PNA  Mild leukocytosis and febrile following recent PVI/CTI/mitral line.  Most likely respiratory infection following intubation and procedure.  Has ongoing cough, shortness of breath and fever consistent with PNA. CT scan also c/w this. Timeline would not make sense for something more serious like atrial esophageal fistula.  PFA ablation on posterior wall and RF ablation on septum and CTI making esophageal fistula unlikely.  No changes to current medication regimen.  Continuing on apixaban  for stroke risk reduction and amiodarone .  For questions or updates, please contact [facility] Please consult [email or web address] for contact info under   Signed, Donnice DELENA Primus, MD  07/24/2024 8:00 PM

## 2024-07-24 NOTE — Progress Notes (Addendum)
 PROGRESS NOTE    Shane [name]  FMW:[id number] DOB: [birth year] DOA: 07/23/2024 PCP: Valerio Melanie DASEN, NP   Brief Narrative:  This 78 yrs old Male with PMH significant for combined systolic and diastolic heart failure, paroxysmal A-fib status post ablation on 07/21/2024.  Patient is still on Eliquis  5 mg twice daily as well as amiodarone  100 mg.  Patient presented after 2 days after ablation.  He started spiking fevers at home in last 2 days.  Tmax noted was 103.  Patient has been taking over-the-counter Tylenol  but no subsequent improvement in symptoms.  He has developed some shortness of breath with productive sputum.  Patient reports he was told if he becomes febrile or had any other symptoms,  He should come to directly to the hospital.  In the ED he was febrile.  Chest x-ray demonstrated mild vascular congestion,  borderline cardiomegaly.  UA negative for evidence of infection.  Patient was admitted for possible aspiration pneumonia and started on antibiotics.  Assessment & Plan:   Principal Problem:   Aspiration pneumonia (HCC) Active Problems:   Sepsis (HCC)   Chronic combined systolic and diastolic heart failure (HCC)   OSA (obstructive sleep apnea)   Status post catheter ablation of atrial fibrillation   Presence of cardiac pacemaker   SSS (sick sinus syndrome) (HCC)   Secondary hypercoagulable state  Suspected Sepsis secondary to aspiration pneumonia: Patient presented with fever, hypotension, leukocytosis, cough, shortness of breath. Status post ablation has developed fever and shortness of breath with cough. Patient initiated on IV Zosyn .  Follow blood cultures. Patient is not hypoxic,  not requiring oxygen at this time. Chest x-ray demonstrated some pulmonary congestion. CT chest  shows airspace opacities consistent with pneumonia. Lactic acid 0.8.  Continue IV fluid resuscitation for hypotension. Follow up Procalcitonin.  Chronic combined systolic and diastolic heart  failure: Elevated troponin likely due to demand ischemia: BNP 569.  Troponin elevated trending down. Patient did have venous congestion demonstrated on chest x-ray.   However he is hypotensive and likely septic. He does not seem in acute exacerbation. Continue gentle IV hydration.  Hold on diuretics at this time. Cardiology is consulted.  OSA: Patient wears CPAP at home.  Atrial fibrillation is status post ablation. Patient had a procedure performed on 07/21/2024. Continue telemetry. Continue amiodarone  400 mg daily Continue Eliquis  5 mg twice daily. Cardiology consulted.  Iron deficiency anemia. Continue ferrous sulfate .  Hypokalemia: Replaced.  Continue to monitor  DVT prophylaxis: Eliquis  Code Status: Full code Family Communication: No family at bed side Disposition Plan:   Status is: Inpatient Remains inpatient appropriate because: Patient admitted for fever, cough, shortness of breath,  post ablation for A-fib.  Started on empiric antibiotics.   Consultants:  Cardiology  Procedures:  Antimicrobials:  Anti-infectives (From admission, onward)    Start     Dose/Rate Route Frequency Ordered Stop   07/24/24 1000  piperacillin -tazobactam (ZOSYN ) IVPB 3.375 g        3.375 g 12.5 mL/hr over 240 Minutes Intravenous Every 8 hours 07/23/24 2305     07/23/24 2130  metroNIDAZOLE  (FLAGYL ) IVPB 500 mg        500 mg 100 mL/hr over 60 Minutes Intravenous  Once 07/23/24 2117 07/24/24 0027   07/23/24 2130  ceFEPIme  (MAXIPIME ) 2 g in sodium chloride  0.9 % 100 mL IVPB        2 g 200 mL/hr over 30 Minutes Intravenous  Once 07/23/24 2124 07/23/24 2207   07/23/24 2130  vancomycin  (VANCOREADY)  IVPB 2000 mg/400 mL        2,000 mg 200 mL/hr over 120 Minutes Intravenous  Once 07/23/24 2124 07/24/24 0101      Subjective: Patient was seen and examined at bedside.  Overnight events noted. Patient reports feeling better, He denies any chest pain or shortness of  breath.   Objective: Vitals:   07/24/24 0700 07/24/24 0730 07/24/24 0739 07/24/24 0745  BP: (!) 101/54 (!) 100/54  (!) 110/58  Pulse: (!) 58 (!) 59  60  Resp: 20 20  (!) 21  Temp:   (!) 101 F (38.3 C)   TempSrc:   Oral   SpO2: 95% 94%  97%  Weight:      Height:        Intake/Output Summary (Last 24 hours) at 07/24/2024 1037 Last data filed at 07/24/2024 0101 Gross per 24 hour  Intake 1110.03 ml  Output --  Net 1110.03 ml   Filed Weights   07/23/24 1956  Weight: 98.4 kg    Examination:  General exam: Appears calm and comfortable, deconditioned, not in any acute distress Respiratory system: Clear to auscultation. Respiratory effort normal. Cardiovascular system: S1 & S2 heard, RRR. No JVD, murmurs, rubs, gallops or clicks.  Gastrointestinal system: Abdomen is nondistended, soft and nontender.  Normal bowel sounds heard. Central nervous system: Alert and oriented X 3. No focal neurological deficits. Extremities: No edema, no cyanosis, no clubbing. Skin: No rashes, lesions or ulcers Psychiatry: Judgement and insight appear normal. Mood & affect appropriate.     Data Reviewed: I have personally reviewed following labs and imaging studies  CBC: Recent Labs  Lab 07/23/24 2013 07/24/24 0126  WBC 11.6* 11.6*  NEUTROABS 9.7* 8.9*  HGB 14.9 13.6  HCT 43.7 40.4  MCV 98.6 99.8  PLT 195 171   Basic Metabolic Panel: Recent Labs  Lab 07/23/24 2013 07/24/24 0126  NA 131* 136  K 3.5 3.2*  CL 95* 99  CO2 28 27  GLUCOSE 93 99  BUN 20 19  CREATININE 1.22 1.11  CALCIUM 8.4* 8.3*   GFR: Estimated Creatinine Clearance: 64.5 mL/min (by C-G formula based on SCr of 1.11 mg/dL). Liver Function Tests: Recent Labs  Lab 07/23/24 2013 07/24/24 0126  AST 32 27  ALT 41 35  ALKPHOS 81 72  BILITOT 1.6* 1.6*  PROT 6.7 6.0*  ALBUMIN 3.0* 2.7*   No results for input(s): LIPASE, AMYLASE in the last 168 hours. No results for input(s): AMMONIA in the last 168  hours. Coagulation Profile: Recent Labs  Lab 07/23/24 2013  INR 1.4*   Cardiac Enzymes: No results for input(s): CKTOTAL, CKMB, CKMBINDEX, TROPONINI in the last 168 hours. BNP (last 3 results) No results for input(s): PROBNP in the last 8760 hours. HbA1C: No results for input(s): HGBA1C in the last 72 hours. CBG: No results for input(s): GLUCAP in the last 168 hours. Lipid Profile: No results for input(s): CHOL, HDL, LDLCALC, TRIG, CHOLHDL, LDLDIRECT in the last 72 hours. Thyroid  Function Tests: No results for input(s): TSH, T4TOTAL, FREET4, T3FREE, THYROIDAB in the last 72 hours. Anemia Panel: No results for input(s): VITAMINB12, FOLATE, FERRITIN, TIBC, IRON, RETICCTPCT in the last 72 hours. Sepsis Labs: Recent Labs  Lab 07/23/24 2013 07/23/24 2221  LATICACIDVEN 0.8 0.6    Recent Results (from the past 240 hours)  Resp panel by RT-PCR (RSV, Flu A&B, Covid) Anterior Nasal Swab     Status: None   Collection Time: 07/23/24  8:33 PM   Specimen: Anterior Nasal  Swab  Result Value Ref Range Status   SARS Coronavirus 2 by RT PCR NEGATIVE NEGATIVE Final   Influenza A by PCR NEGATIVE NEGATIVE Final   Influenza B by PCR NEGATIVE NEGATIVE Final    Comment: (NOTE) The Xpert Xpress SARS-CoV-2/FLU/RSV plus assay is intended as an aid in the diagnosis of influenza from Nasopharyngeal swab specimens and should not be used as a sole basis for treatment. Nasal washings and aspirates are unacceptable for Xpert Xpress SARS-CoV-2/FLU/RSV testing.  Fact Sheet for Patients: bloggercourse.com  Fact Sheet for Healthcare Providers: seriousbroker.it  This test is not yet approved or cleared by the [place]  FDA and has been authorized for detection and/or diagnosis of SARS-CoV-2 by FDA under an Emergency Use Authorization (EUA). This EUA will remain in effect (meaning this test can be used)  for the duration of the COVID-19 declaration under Section 564(b)(1) of the Act, 21 U.S.C. section 360bbb-3(b)(1), unless the authorization is terminated or revoked.     Resp Syncytial Virus by PCR NEGATIVE NEGATIVE Final    Comment: (NOTE) Fact Sheet for Patients: bloggercourse.com  Fact Sheet for Healthcare Providers: seriousbroker.it  This test is not yet approved or cleared by the [place]  FDA and has been authorized for detection and/or diagnosis of SARS-CoV-2 by FDA under an Emergency Use Authorization (EUA). This EUA will remain in effect (meaning this test can be used) for the duration of the COVID-19 declaration under Section 564(b)(1) of the Act, 21 U.S.C. section 360bbb-3(b)(1), unless the authorization is terminated or revoked.  Performed at [facility], [address] [place], [place] [address]     Radiology Studies: CT CHEST WO CONTRAST Result Date: 07/23/2024 EXAM: CT CHEST WITHOUT CONTRAST 07/23/2024 11:31:30 PM TECHNIQUE: CT of the chest was performed without the administration of intravenous contrast. Multiplanar reformatted images are provided for review. Automated exposure control, iterative reconstruction, and/or weight based adjustment of the mA/kV was utilized to reduce the radiation dose to as low as reasonably achievable. COMPARISON: Chest x-ray today. CLINICAL HISTORY: Respiratory illness, nondiagnostic xray. FINDINGS: MEDIASTINUM: Pacer wires in the right heart. Heart and pericardium are otherwise unremarkable. The central airways are clear. LYMPH NODES: Mildly enlarged mediastinal lymph nodes. Right paratracheal lymph node with a short axis diameter of 13 mm. Prevascular lymph node with a short-axis diameter of 11 mm. Subcarinal lymph node with a short axis diameter of 13 mm. No axillary adenopathy. LUNGS AND PLEURA: Airspace opacities medially in the right lower lobe and posteriorly in the left  lower lobe concerning for pneumonia. Several nodules along the left minor fissure are stable, likely postinflammatory. No pleural effusion or pneumothorax. SOFT TISSUES/BONES: No acute abnormality of the bones or soft tissues. UPPER ABDOMEN: Limited images of the upper abdomen demonstrate gallstones within the gallbladder measuring up to 3 cm. IMPRESSION: 1. Airspace opacities in the right lower lobe medially and left lower lobe posteriorly, concerning for pneumonia. 2. Mild mediastinal lymphadenopathy involving right paratracheal, prevascular, and subcarinal stations, which may be reactive. Electronically signed by: Franky Crease MD 07/23/2024 11:41 PM EST RP Workstation: [id number]   DG Chest Portable 1 View Result Date: 07/23/2024 EXAM: 1 VIEW(S) XRAY OF THE CHEST 07/23/2024 09:03:00 PM COMPARISON: 05/24/2024 CLINICAL HISTORY: SOB SOB FINDINGS: LINES, TUBES AND DEVICES: Left pacer remains in place, unchanged. LUNGS AND PLEURA: Mild vascular congestion. No pleural effusion. No pneumothorax. HEART AND MEDIASTINUM: Heart is borderline in size. No acute abnormality of the mediastinal silhouette. BONES AND SOFT TISSUES: No acute osseous abnormality. IMPRESSION: 1.  Mild vascular congestion. 2. Borderline cardiomegaly. Electronically signed by: Franky Crease MD 07/23/2024 09:07 PM EST RP Workstation: [id number]   Scheduled Meds:  allopurinol   300 mg Oral Daily   amiodarone   400 mg Oral Daily   apixaban   5 mg Oral BID   cyanocobalamin   1,000 mcg Oral Daily   ferrous sulfate   325 mg Oral Q0600   Continuous Infusions:  sodium chloride      lactated ringers  150 mL/hr (07/24/24 0540)   piperacillin -tazobactam 3.375 g (07/24/24 0911)     LOS: 0 days    Time spent: 50 mins    Darcel Dawley, MD [facility]   If 7PM-7AM, please contact night-coverage

## 2024-07-24 NOTE — ED Notes (Signed)
 MD Leotis notified of patient fever.

## 2024-07-24 NOTE — ED Notes (Signed)
 PT placed on monitor with CCMD

## 2024-07-24 NOTE — Plan of Care (Signed)
 Problem: Fluid Volume: Goal: Hemodynamic stability will improve Outcome: Progressing   Problem: Clinical Measurements: Goal: Diagnostic test results will improve Outcome: Progressing Goal: Signs and symptoms of infection will decrease Outcome: Progressing   Problem: Respiratory: Goal: Ability to maintain adequate ventilation will improve Outcome: Progressing   Problem: Education: Goal: Knowledge of General Education information will improve Description: Including pain rating scale, medication(s)/side effects and non-pharmacologic comfort measures Outcome: Progressing   Problem: Health Behavior/Discharge Planning: Goal: Ability to manage health-related needs will improve Outcome: Progressing   Problem: Clinical Measurements: Goal: Ability to maintain clinical measurements within normal limits will improve Outcome: Progressing Goal: Will remain free from infection Outcome: Progressing Goal: Diagnostic test results will improve Outcome: Progressing Goal: Respiratory complications will improve Outcome: Progressing Goal: Cardiovascular complication will be avoided Outcome: Progressing   Problem: Activity: Goal: Risk for activity intolerance will decrease Outcome: Progressing   Problem: Nutrition: Goal: Adequate nutrition will be maintained Outcome: Progressing   Problem: Coping: Goal: Level of anxiety will decrease Outcome: Progressing   Problem: Elimination: Goal: Will not experience complications related to bowel motility Outcome: Progressing Goal: Will not experience complications related to urinary retention Outcome: Progressing   Problem: Pain Managment: Goal: General experience of comfort will improve and/or be controlled Outcome: Progressing   Problem: Safety: Goal: Ability to remain free from injury will improve Outcome: Progressing   Problem: Skin Integrity: Goal: Risk for impaired skin integrity will decrease Outcome: Progressing   Problem:  Fluid Volume: Goal: Hemodynamic stability will improve Outcome: Progressing   Problem: Clinical Measurements: Goal: Diagnostic test results will improve Outcome: Progressing Goal: Signs and symptoms of infection will decrease Outcome: Progressing   Problem: Respiratory: Goal: Ability to maintain adequate ventilation will improve Outcome: Progressing   Problem: Education: Goal: Knowledge of General Education information will improve Description: Including pain rating scale, medication(s)/side effects and non-pharmacologic comfort measures Outcome: Progressing   Problem: Health Behavior/Discharge Planning: Goal: Ability to manage health-related needs will improve Outcome: Progressing   Problem: Clinical Measurements: Goal: Ability to maintain clinical measurements within normal limits will improve Outcome: Progressing Goal: Will remain free from infection Outcome: Progressing Goal: Diagnostic test results will improve Outcome: Progressing Goal: Respiratory complications will improve Outcome: Progressing Goal: Cardiovascular complication will be avoided Outcome: Progressing   Problem: Activity: Goal: Risk for activity intolerance will decrease Outcome: Progressing   Problem: Nutrition: Goal: Adequate nutrition will be maintained Outcome: Progressing   Problem: Coping: Goal: Level of anxiety will decrease Outcome: Progressing   Problem: Elimination: Goal: Will not experience complications related to bowel motility Outcome: Progressing Goal: Will not experience complications related to urinary retention Outcome: Progressing   Problem: Pain Managment: Goal: General experience of comfort will improve and/or be controlled Outcome: Progressing   Problem: Safety: Goal: Ability to remain free from injury will improve Outcome: Progressing   Problem: Skin Integrity: Goal: Risk for impaired skin integrity will decrease Outcome: Progressing   Problem: Fluid  Volume: Goal: Hemodynamic stability will improve Outcome: Progressing   Problem: Clinical Measurements: Goal: Diagnostic test results will improve Outcome: Progressing Goal: Signs and symptoms of infection will decrease Outcome: Progressing   Problem: Respiratory: Goal: Ability to maintain adequate ventilation will improve Outcome: Progressing   Problem: Education: Goal: Knowledge of General Education information will improve Description: Including pain rating scale, medication(s)/side effects and non-pharmacologic comfort measures Outcome: Progressing   Problem: Health Behavior/Discharge Planning: Goal: Ability to manage health-related needs will improve Outcome: Progressing   Problem: Clinical Measurements: Goal: Ability to maintain clinical measurements within  normal limits will improve Outcome: Progressing Goal: Will remain free from infection Outcome: Progressing Goal: Diagnostic test results will improve Outcome: Progressing Goal: Respiratory complications will improve Outcome: Progressing Goal: Cardiovascular complication will be avoided Outcome: Progressing   Problem: Activity: Goal: Risk for activity intolerance will decrease Outcome: Progressing   Problem: Nutrition: Goal: Adequate nutrition will be maintained Outcome: Progressing   Problem: Coping: Goal: Level of anxiety will decrease Outcome: Progressing   Problem: Elimination: Goal: Will not experience complications related to bowel motility Outcome: Progressing Goal: Will not experience complications related to urinary retention Outcome: Progressing   Problem: Pain Managment: Goal: General experience of comfort will improve and/or be controlled Outcome: Progressing   Problem: Safety: Goal: Ability to remain free from injury will improve Outcome: Progressing   Problem: Skin Integrity: Goal: Risk for impaired skin integrity will decrease Outcome: Progressing   Problem: Fluid Volume: Goal:  Hemodynamic stability will improve Outcome: Progressing   Problem: Clinical Measurements: Goal: Diagnostic test results will improve Outcome: Progressing Goal: Signs and symptoms of infection will decrease Outcome: Progressing   Problem: Respiratory: Goal: Ability to maintain adequate ventilation will improve Outcome: Progressing   Problem: Education: Goal: Knowledge of General Education information will improve Description: Including pain rating scale, medication(s)/side effects and non-pharmacologic comfort measures Outcome: Progressing   Problem: Health Behavior/Discharge Planning: Goal: Ability to manage health-related needs will improve Outcome: Progressing   Problem: Clinical Measurements: Goal: Ability to maintain clinical measurements within normal limits will improve Outcome: Progressing Goal: Will remain free from infection Outcome: Progressing Goal: Diagnostic test results will improve Outcome: Progressing Goal: Respiratory complications will improve Outcome: Progressing Goal: Cardiovascular complication will be avoided Outcome: Progressing   Problem: Activity: Goal: Risk for activity intolerance will decrease Outcome: Progressing   Problem: Nutrition: Goal: Adequate nutrition will be maintained Outcome: Progressing   Problem: Coping: Goal: Level of anxiety will decrease Outcome: Progressing   Problem: Elimination: Goal: Will not experience complications related to bowel motility Outcome: Progressing Goal: Will not experience complications related to urinary retention Outcome: Progressing   Problem: Pain Managment: Goal: General experience of comfort will improve and/or be controlled Outcome: Progressing   Problem: Safety: Goal: Ability to remain free from injury will improve Outcome: Progressing   Problem: Skin Integrity: Goal: Risk for impaired skin integrity will decrease Outcome: Progressing

## 2024-07-24 NOTE — ED Notes (Signed)
 Pt stood up beside stretcher to urinate.

## 2024-07-24 NOTE — ED Notes (Signed)
 This RN received care of this PT at this time. 2300 Labs were not collected when this RN received the PT

## 2024-07-24 NOTE — Evaluation (Signed)
 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   guidant  [employer] [phone])   Sinus node dysfunction (HCC)    Sleep apnea    CPAP   Wears hearing aid in both ears    Past Surgical History:  Past Surgical History:  Procedure Laterality Date   ATRIAL FIBRILLATION ABLATION N/A 04/03/2017   Procedure: Atrial Fibrillation Ablation;  Surgeon: Inocencio Soyla Lunger, MD;  Location: [facility] INVASIVE CV LAB;  Service: Cardiovascular;  Laterality: N/A;   ATRIAL FIBRILLATION ABLATION N/A 03/01/2021   Procedure: ATRIAL FIBRILLATION ABLATION;  Surgeon: Inocencio Soyla Lunger, MD;  Location: [facility] INVASIVE CV LAB;  Service: Cardiovascular;  Laterality: N/A;   ATRIAL FIBRILLATION ABLATION N/A 07/21/2024   Procedure: ATRIAL FIBRILLATION ABLATION;  Surgeon: Inocencio Soyla Lunger, MD;  Location: [facility] INVASIVE CV LAB;  Service: Cardiovascular;  Laterality: N/A;   CARDIAC CATHETERIZATION  10/13/1994   No CAD   CARDIAC CATHETERIZATION  05/10/2003   No CAD   CARDIOVERSION N/A 10/21/2016   Procedure: CARDIOVERSION;  Surgeon: Jerel Balding, MD;  Location: [facility] ENDOSCOPY;  Service: Cardiovascular;  Laterality: N/A;   CARDIOVERSION N/A 05/15/2017   Procedure: CARDIOVERSION;  Surgeon: Okey Vina GAILS, MD;  Location: [facility] ENDOSCOPY;  Service: Cardiovascular;  Laterality: N/A;   CARDIOVERSION N/A 10/21/2019   Procedure: CARDIOVERSION;  Surgeon: Balding Jerel, MD;  Location: [facility] ENDOSCOPY;  Service: Cardiovascular;  Laterality: N/A;   CARDIOVERSION N/A 02/24/2020   Procedure: CARDIOVERSION;  Surgeon: Pietro Redell RAMAN, MD;  Location: [facility] ENDOSCOPY;  Service: Cardiovascular;  Laterality: N/A;   CARDIOVERSION N/A 04/11/2020   Procedure: CARDIOVERSION;  Surgeon: Raford Riggs, MD;  Location: [facility] ENDOSCOPY;  Service: Cardiovascular;  Laterality: N/A;   CARDIOVERSION N/A 12/26/2020   Procedure: CARDIOVERSION;  Surgeon: Balding Jerel, MD;  Location: [facility] ENDOSCOPY;  Service: Cardiovascular;  Laterality: N/A;   CATARACT EXTRACTION W/PHACO Left 05/18/2019   Procedure: CATARACT EXTRACTION PHACO AND INTRAOCULAR LENS PLACEMENT (IOC) LEFT  1:21 11.0% 9.01;  Surgeon: Mittie Gaskin, MD;  Location: [facility] SURGERY CNTR;  Service: Ophthalmology;  Laterality: Left;  sleep apnea   CATARACT EXTRACTION W/PHACO Right 06/29/2019   Procedure: CATARACT EXTRACTION PHACO AND INTRAOCULAR LENS PLACEMENT (IOC) RIGHT  01:14.2  14.5%  10.92;  Surgeon: Mittie Gaskin, MD;  Location: [facility] SURGERY CNTR;  Service: Ophthalmology;  Laterality: Right;  sleep apnea   CYST EXCISION  03/2019   on back    EYE SURGERY     INSERT / REPLACE / REMOVE PACEMAKER     NM MYOCAR PERF WALL MOTION  05/07/2011   Lexiscan : No ishcemia   PERMANENT PACEMAKER INSERTION  03/12/2010   guidant   PPM GENERATOR CHANGEOUT N/A 08/06/2020   Procedure: PPM GENERATOR CHANGEOUT;  Surgeon: Balding Jerel, MD;  Location: [facility] INVASIVE CV LAB;  Service: Cardiovascular;  Laterality: N/A;   ROTATOR CUFF REPAIR  1996   SHOULDER ARTHROSCOPY  TEE WITHOUT CARDIOVERSION N/A 03/01/2021   Procedure: TRANSESOPHAGEAL ECHOCARDIOGRAM (TEE);  Surgeon: Inocencio Soyla Lunger, MD;  Location: [facility] INVASIVE CV LAB;  Service: Cardiovascular;  Laterality: N/A;   US  ECHOCARDIOGRAPHY  05/07/2011   mod. LVH,LA mod. dilated,borderline aortic root dilatation   HPI:  Shane [name] is a 78 year old male admitted to ED  with SOB and fever. CXR with mild vascular congestion, borderline cardiomegaly. Patient admitted for possible aspiration PNA. PMH of combined systolic and diastolic heart failure, paroxysmal A-fib status post ablation on 07/21/2024.    Assessment / Plan / Recommendation  Clinical Impression  Swallow evaluation complete. Patient present with a normal oropharyngeal swallow with appropriate and full mastication abilities and control of bolus, timely appearing swallow initiation based on palpation and no overt indication of aspiration. He reports no h/o dysphagia and has limited risk factors for dysphagia or aspiration. If patient with aspiration PNA acutely, suspect it originated from aspiraton during recent cardiac procedure rather than from aspiration of food/liquids during po intake. No SLP f/u indicated at this time. SLP Visit Diagnosis: Dysphagia, unspecified (R13.10)    Aspiration Risk  No limitations    Diet Recommendation Regular;Thin liquid    Liquid Administration via: Cup;Straw Medication Administration: Whole meds with liquid Supervision: Patient able to self feed Compensations: Slow rate;Small sips/bites Postural Changes: Seated upright at 90 degrees    Other  Recommendations Oral Care Recommendations: Oral care BID        Functional Status Assessment Patient has not had a recent decline in their functional status    Swallow Study   General HPI: Shane [name] is a 78 year old male admitted to ED with SOB and fever. CXR with mild vascular congestion, borderline cardiomegaly. Patient admitted for possible aspiration PNA. PMH of combined systolic and diastolic heart failure, paroxysmal A-fib status post ablation on 07/21/2024. Type of Study: Bedside Swallow Evaluation Previous Swallow Assessment: none Diet Prior to this Study: Regular;Thin liquids (Level 0) Temperature Spikes Noted: No Respiratory Status: Room air History of Recent Intubation: No Behavior/Cognition:  Alert;Cooperative;Pleasant mood Oral Cavity Assessment: Within Functional Limits Oral Care Completed by SLP: No Oral Cavity - Dentition: Adequate natural dentition Vision: Functional for self-feeding Self-Feeding Abilities: Able to feed self Patient Positioning: Upright in bed Baseline Vocal Quality: Normal Volitional Cough: Strong Volitional Swallow: Able to elicit    Oral/Motor/Sensory Function Overall Oral Motor/Sensory Function: Within functional limits   Ice Chips Ice chips: Not tested   Thin Liquid Thin Liquid: Within functional limits Presentation: Cup;Self Fed;Straw    Nectar Thick Nectar Thick Liquid: Not tested   Honey Thick Honey Thick Liquid: Not tested   Puree Puree: Within functional limits Presentation: Spoon;Self Fed   Solid     Solid: Within functional limits Presentation: Self Fed     [employer] MA, CCC-SLP  Analyssa Downs Meryl 07/24/2024,3:00 PM

## 2024-07-25 DIAGNOSIS — Z9889 Other specified postprocedural states: Secondary | ICD-10-CM | POA: Diagnosis not present

## 2024-07-25 DIAGNOSIS — J690 Pneumonitis due to inhalation of food and vomit: Secondary | ICD-10-CM | POA: Diagnosis not present

## 2024-07-25 LAB — PROCALCITONIN: Procalcitonin: <0.1 ng/mL

## 2024-07-25 NOTE — Progress Notes (Signed)
 PROGRESS NOTE    Shane [name]  FMW:[id number] DOB: [birth year] DOA: 07/23/2024 PCP: Valerio Melanie DASEN, NP   Brief Narrative:  This 78 yrs old Male with PMH significant for combined systolic and diastolic heart failure, paroxysmal A-fib status post ablation on 07/21/2024.  Patient is still on Eliquis  5 mg twice daily as well as amiodarone  100 mg.  Patient presented after 2 days after ablation.  He started spiking fevers at home in last 2 days.  Tmax noted was 103.  Patient has been taking over-the-counter Tylenol  but no subsequent improvement in symptoms.  He has developed some shortness of breath with productive sputum.  Patient reports he was told if he becomes febrile or had any other symptoms,  He should come to directly to the hospital.  In the ED he was febrile.  Chest x-ray demonstrated mild vascular congestion,  borderline cardiomegaly.  UA negative for evidence of infection.  Patient was admitted for possible aspiration pneumonia and started on antibiotics.  Assessment & Plan:   Principal Problem:   Aspiration pneumonia (HCC) Active Problems:   Sepsis (HCC)   Chronic combined systolic and diastolic heart failure (HCC)   OSA (obstructive sleep apnea)   Status post catheter ablation of atrial fibrillation   Presence of cardiac pacemaker   SSS (sick sinus syndrome) (HCC)   Secondary hypercoagulable state  Suspected Sepsis secondary to aspiration pneumonia: Patient presented with fever, hypotension, leukocytosis, cough, shortness of breath. Status post ablation has developed fever and shortness of breath with cough. Patient initiated on IV Zosyn .  Cultures no growth for 2 days Patient is not hypoxic,  not requiring oxygen at this time. Chest x-ray demonstrated some pulmonary congestion. CT chest  shows airspace opacities consistent with pneumonia. Lactic acid 0.8.  Continue IV fluid resuscitation for hypotension. Follow up Procalcitonin. Still remains febrile.  Chronic combined  systolic and diastolic heart failure: Elevated troponin likely due to demand ischemia: BNP 569.  Troponin elevated but trending down. Patient did have venous congestion demonstrated on chest x-ray.   However he is hypotensive and likely septic. He does not seem in acute exacerbation. Continue gentle IV hydration.  Hold on diuretics at this time. Cardiology is consulted.  Agree with the IV antibiotics.  OSA: Patient wears CPAP at home.  Atrial fibrillation is status post ablation. Patient had a procedure performed on 07/21/2024. Continue telemetry.  Maintaining normal sinus rhythm. Continue amiodarone  400 mg daily Continue Eliquis  5 mg twice daily. Cardiology consulted.  Iron deficiency anemia. Continue ferrous sulfate .  Hypokalemia: Replaced.  Continue to monitor  DVT prophylaxis: Eliquis  Code Status: Full code Family Communication: No family at bed side. Disposition Plan:   Status is: Inpatient Remains inpatient appropriate because: Patient admitted for fever, cough, shortness of breath,  post ablation for A-fib.  Started on empiric antibiotics.  Cardiology was consulted.   Consultants:  Cardiology  Procedures:  Antimicrobials:  Anti-infectives (From admission, onward)    Start     Dose/Rate Route Frequency Ordered Stop   07/24/24 1000  piperacillin -tazobactam (ZOSYN ) IVPB 3.375 g        3.375 g 12.5 mL/hr over 240 Minutes Intravenous Every 8 hours 07/23/24 2305     07/23/24 2130  metroNIDAZOLE  (FLAGYL ) IVPB 500 mg        500 mg 100 mL/hr over 60 Minutes Intravenous  Once 07/23/24 2117 07/24/24 0027   07/23/24 2130  ceFEPIme  (MAXIPIME ) 2 g in sodium chloride  0.9 % 100 mL IVPB  2 g 200 mL/hr over 30 Minutes Intravenous  Once 07/23/24 2124 07/23/24 2207   07/23/24 2130  vancomycin  (VANCOREADY) IVPB 2000 mg/400 mL        2,000 mg 200 mL/hr over 120 Minutes Intravenous  Once 07/23/24 2124 07/24/24 0101      Subjective: Patient was seen and examined at  bedside.  Overnight events noted. Patient reports feeling better, He denies any chest pain or shortness of breath. He was sitting comfortably in the chair, completed his breakfast.   Objective: Vitals:   07/24/24 2000 07/25/24 0000 07/25/24 0400 07/25/24 0723  BP: (!) 94/51 (!) 117/53 110/69 (!) 97/57  Pulse:  65 72   Resp: 20 20 20 18   Temp: 99.2 F (37.3 C) 98.5 F (36.9 C) (!) 102.1 F (38.9 C) 98.2 F (36.8 C)  TempSrc: Oral Oral Oral Oral  SpO2:      Weight:      Height:        Intake/Output Summary (Last 24 hours) at 07/25/2024 1040 Last data filed at 07/24/2024 2100 Gross per 24 hour  Intake 2193.84 ml  Output --  Net 2193.84 ml   Filed Weights   07/23/24 1956 07/24/24 1502  Weight: 98.4 kg 103 kg    Examination:  General exam: Appears calm and comfortable, deconditioned, not in any acute distress. Respiratory system: CTA Bilaterally. Respiratory effort normal. RR 15 Cardiovascular system: S1 & S2 heard, RRR. No JVD, murmurs, rubs, gallops or clicks.  Gastrointestinal system: Abdomen is non distended, soft and non tender.  Normal bowel sounds heard. Central nervous system: Alert and oriented X 3. No focal neurological deficits. Extremities: No edema, no cyanosis, no clubbing. Skin: No rashes, lesions or ulcers Psychiatry: Judgement and insight appear normal. Mood & affect appropriate.    Data Reviewed: I have personally reviewed following labs and imaging studies  CBC: Recent Labs  Lab 07/23/24 2013 07/24/24 0126  WBC 11.6* 11.6*  NEUTROABS 9.7* 8.9*  HGB 14.9 13.6  HCT 43.7 40.4  MCV 98.6 99.8  PLT 195 171   Basic Metabolic Panel: Recent Labs  Lab 07/23/24 2013 07/24/24 0126  NA 131* 136  K 3.5 3.2*  CL 95* 99  CO2 28 27  GLUCOSE 93 99  BUN 20 19  CREATININE 1.22 1.11  CALCIUM 8.4* 8.3*   GFR: Estimated Creatinine Clearance: 65.9 mL/min (by C-G formula based on SCr of 1.11 mg/dL). Liver Function Tests: Recent Labs  Lab  07/23/24 2013 07/24/24 0126  AST 32 27  ALT 41 35  ALKPHOS 81 72  BILITOT 1.6* 1.6*  PROT 6.7 6.0*  ALBUMIN 3.0* 2.7*   No results for input(s): LIPASE, AMYLASE in the last 168 hours. No results for input(s): AMMONIA in the last 168 hours. Coagulation Profile: Recent Labs  Lab 07/23/24 2013 07/24/24 1516  INR 1.4* 1.5*   Cardiac Enzymes: No results for input(s): CKTOTAL, CKMB, CKMBINDEX, TROPONINI in the last 168 hours. BNP (last 3 results) No results for input(s): PROBNP in the last 8760 hours. HbA1C: No results for input(s): HGBA1C in the last 72 hours. CBG: No results for input(s): GLUCAP in the last 168 hours. Lipid Profile: No results for input(s): CHOL, HDL, LDLCALC, TRIG, CHOLHDL, LDLDIRECT in the last 72 hours. Thyroid  Function Tests: No results for input(s): TSH, T4TOTAL, FREET4, T3FREE, THYROIDAB in the last 72 hours. Anemia Panel: No results for input(s): VITAMINB12, FOLATE, FERRITIN, TIBC, IRON, RETICCTPCT in the last 72 hours. Sepsis Labs: Recent Labs  Lab 07/23/24 2013 07/23/24  2221  LATICACIDVEN 0.8 0.6    Recent Results (from the past 240 hours)  Culture, blood (Routine x 2)     Status: None (Preliminary result)   Collection Time: 07/23/24  8:13 PM   Specimen: BLOOD RIGHT ARM  Result Value Ref Range Status   Specimen Description BLOOD RIGHT ARM  Final   Special Requests   Final    BOTTLES DRAWN AEROBIC AND ANAEROBIC Blood Culture results may not be optimal due to an inadequate volume of blood received in culture bottles   Culture   Final    NO GROWTH 2 DAYS Performed at [facility], [address], [place], [place] [address]    Report Status PENDING  Incomplete  Culture, blood (Routine x 2)     Status: None (Preliminary result)   Collection Time: 07/23/24  8:13 PM   Specimen: BLOOD LEFT ARM  Result Value Ref Range Status   Specimen Description BLOOD LEFT ARM  Final   Special  Requests   Final    BOTTLES DRAWN AEROBIC ONLY Blood Culture results may not be optimal due to an inadequate volume of blood received in culture bottles   Culture   Final    NO GROWTH 2 DAYS Performed at [facility], [address], [place], [place] [address]    Report Status PENDING  Incomplete  Resp panel by RT-PCR (RSV, Flu A&B, Covid) Anterior Nasal Swab     Status: None   Collection Time: 07/23/24  8:33 PM   Specimen: Anterior Nasal Swab  Result Value Ref Range Status   SARS Coronavirus 2 by RT PCR NEGATIVE NEGATIVE Final   Influenza A by PCR NEGATIVE NEGATIVE Final   Influenza B by PCR NEGATIVE NEGATIVE Final    Comment: (NOTE) The Xpert Xpress SARS-CoV-2/FLU/RSV plus assay is intended as an aid in the diagnosis of influenza from Nasopharyngeal swab specimens and should not be used as a sole basis for treatment. Nasal washings and aspirates are unacceptable for Xpert Xpress SARS-CoV-2/FLU/RSV testing.  Fact Sheet for Patients: bloggercourse.com  Fact Sheet for Healthcare Providers: seriousbroker.it  This test is not yet approved or cleared by the [place]  FDA and has been authorized for detection and/or diagnosis of SARS-CoV-2 by FDA under an Emergency Use Authorization (EUA). This EUA will remain in effect (meaning this test can be used) for the duration of the COVID-19 declaration under Section 564(b)(1) of the Act, 21 U.S.C. section 360bbb-3(b)(1), unless the authorization is terminated or revoked.     Resp Syncytial Virus by PCR NEGATIVE NEGATIVE Final    Comment: (NOTE) Fact Sheet for Patients: bloggercourse.com  Fact Sheet for Healthcare Providers: seriousbroker.it  This test is not yet approved or cleared by the [place]  FDA and has been authorized for detection and/or diagnosis of SARS-CoV-2 by FDA under an Emergency Use Authorization (EUA).  This EUA will remain in effect (meaning this test can be used) for the duration of the COVID-19 declaration under Section 564(b)(1) of the Act, 21 U.S.C. section 360bbb-3(b)(1), unless the authorization is terminated or revoked.  Performed at [facility], [address] [place], [place] [address]     Radiology Studies: CT CHEST WO CONTRAST Result Date: 07/23/2024 EXAM: CT CHEST WITHOUT CONTRAST 07/23/2024 11:31:30 PM TECHNIQUE: CT of the chest was performed without the administration of intravenous contrast. Multiplanar reformatted images are provided for review. Automated exposure control, iterative reconstruction, and/or weight based adjustment of the mA/kV was utilized to reduce the  radiation dose to as low as reasonably achievable. COMPARISON: Chest x-ray today. CLINICAL HISTORY: Respiratory illness, nondiagnostic xray. FINDINGS: MEDIASTINUM: Pacer wires in the right heart. Heart and pericardium are otherwise unremarkable. The central airways are clear. LYMPH NODES: Mildly enlarged mediastinal lymph nodes. Right paratracheal lymph node with a short axis diameter of 13 mm. Prevascular lymph node with a short-axis diameter of 11 mm. Subcarinal lymph node with a short axis diameter of 13 mm. No axillary adenopathy. LUNGS AND PLEURA: Airspace opacities medially in the right lower lobe and posteriorly in the left lower lobe concerning for pneumonia. Several nodules along the left minor fissure are stable, likely postinflammatory. No pleural effusion or pneumothorax. SOFT TISSUES/BONES: No acute abnormality of the bones or soft tissues. UPPER ABDOMEN: Limited images of the upper abdomen demonstrate gallstones within the gallbladder measuring up to 3 cm. IMPRESSION: 1. Airspace opacities in the right lower lobe medially and left lower lobe posteriorly, concerning for pneumonia. 2. Mild mediastinal lymphadenopathy involving right paratracheal, prevascular, and subcarinal stations, which may be  reactive. Electronically signed by: Franky Crease MD 07/23/2024 11:41 PM EST RP Workstation: [id number]   DG Chest Portable 1 View Result Date: 07/23/2024 EXAM: 1 VIEW(S) XRAY OF THE CHEST 07/23/2024 09:03:00 PM COMPARISON: 05/24/2024 CLINICAL HISTORY: SOB SOB FINDINGS: LINES, TUBES AND DEVICES: Left pacer remains in place, unchanged. LUNGS AND PLEURA: Mild vascular congestion. No pleural effusion. No pneumothorax. HEART AND MEDIASTINUM: Heart is borderline in size. No acute abnormality of the mediastinal silhouette. BONES AND SOFT TISSUES: No acute osseous abnormality. IMPRESSION: 1. Mild vascular congestion. 2. Borderline cardiomegaly. Electronically signed by: Franky Crease MD 07/23/2024 09:07 PM EST RP Workstation: [id number]   Scheduled Meds:  allopurinol   300 mg Oral Daily   amiodarone   400 mg Oral Daily   apixaban   5 mg Oral BID   cyanocobalamin   1,000 mcg Oral Daily   ferrous sulfate   325 mg Oral Q0600   Continuous Infusions:  piperacillin -tazobactam 3.375 g (07/25/24 1010)     LOS: 1 day    Time spent: 50 mins    Darcel Dawley, MD [facility]   If 7PM-7AM, please contact night-coverage

## 2024-07-25 NOTE — Progress Notes (Addendum)
  Patient Name: Shane [name] Date of Encounter: 07/25/2024  Primary Cardiologist: Jerel Balding, MD Electrophysiologist: Soyla Gladis Norton, MD  Interval Summary   Admitted over weekend with fever. History significant of AF ablation 11/20.  Tmax 103 by reports; Tmax overnight 102.1 (this am)  The patient is doing well today.  No new concerns. Had hoped fever would break and could go home today. No SOB at rest.   Vital Signs    Vitals:   07/24/24 1502 07/24/24 2000 07/25/24 0000 07/25/24 0400  BP: 121/67 (!) 94/51 (!) 117/53 110/69  Pulse: 64  65 72  Resp: 20 20 20 20   Temp: 99.1 F (37.3 C) 99.2 F (37.3 C) 98.5 F (36.9 C) (!) 102.1 F (38.9 C)  TempSrc: Oral Oral Oral Oral  SpO2: 94%     Weight: 103 kg     Height: 5' 11 (1.803 m)       Intake/Output Summary (Last 24 hours) at 07/25/2024 9287 Last data filed at 07/24/2024 2100 Gross per 24 hour  Intake 2193.84 ml  Output --  Net 2193.84 ml   Filed Weights   07/23/24 1956 07/24/24 1502  Weight: 98.4 kg 103 kg    Physical Exam    GEN- NAD, Alert and oriented  Lungs- Clear to ausculation bilaterally, normal work of breathing Cardiac- Regular rate and rhythm, no murmurs, rubs or gallops GI- soft, NT, ND, + BS Extremities- no clubbing or cyanosis. No edema  Telemetry    NSR 60-70s (personally reviewed)  Hospital Course    [name] is a 78 y.o. male admitted for fever with history significant for AF ablation 2 days prior. Felt to potentially have aspiration PNA by history and CT.   Assessment & Plan    Fever BCx NGTD. Follow Tmax 102. On Zosyn  Per IM  Persistent AF Paroxysmal AFL Pt maintaining NSR Continue eliquis  5 mg BID for CHA2DS2/VASc of at least 4 Usual follow up in place.    Dr. Norton has seen.   SSS s/p PPM  Stable. Follow BCx.   For questions or updates, please contact [facility] Please consult [email or web address] for contact info under     Signed, Ozell Prentice Passey, PA-C  07/25/2024, 7:12 AM    I have seen and examined this patient with Jodie Passey.  Agree with above, note added to reflect my findings.  Patient had recurrent fever overnight.  No acute complaints today.  Remains in sinus rhythm.  GEN: No acute distress.   Neck: No JVD Cardiac: RRR, no murmurs, rubs, or gallops.  Respiratory: normal BS bases bilaterally. GI: Soft, nontender, non-distended  MS: No edema; No deformity. Neuro:  Nonfocal  Skin: warm and dry, device site well healed Psych: Normal affect    Fever: Likely related to aspiration pneumonia: On Zosyn  per hospitalist service.  No growth at this time. Persistent atrial fibrillation/flutter: Post ablation.  Remains in sinus rhythm.  Continue Eliquis . Sick sinus syndrome: Post pacemaker.  Earnestine Shipp follow blood cultures  Teshia Mahone continue to follow patient's blood cultures.  Fischer Halley see him back on an as needed basis.  Alyviah Crandle continue all of his medications for atrial fibrillation.  He has follow-up arranged in cardiology clinic.  If cultures become positive, we Arienna Benegas see him back  Dondra Rhett M. Ravonda Brecheen MD 07/25/2024 11:33 AM

## 2024-07-25 NOTE — Plan of Care (Signed)

## 2024-07-26 DIAGNOSIS — J690 Pneumonitis due to inhalation of food and vomit: Secondary | ICD-10-CM | POA: Diagnosis not present

## 2024-07-26 LAB — PHOSPHORUS: Phosphorus: 3.1 mg/dL (ref 2.5–4.6)

## 2024-07-26 LAB — BASIC METABOLIC PANEL WITH GFR
Anion gap: 12 (ref 5–15)
BUN: 19 mg/dL (ref 8–23)
CO2: 26 mmol/L (ref 22–32)
Calcium: 8.3 mg/dL — ABNORMAL LOW (ref 8.9–10.3)
Chloride: 100 mmol/L (ref 98–111)
Creatinine, Ser: 0.82 mg/dL (ref 0.61–1.24)
GFR, Estimated: >60 mL/min (ref >60)
Glucose, Bld: 132 mg/dL — ABNORMAL HIGH (ref 70–99)
Potassium: 3.6 mmol/L (ref 3.5–5.1)
Sodium: 138 mmol/L (ref 135–145)

## 2024-07-26 LAB — CBC
HCT: 38.1 % — ABNORMAL LOW (ref 39.0–52.0)
Hemoglobin: 13.2 g/dL (ref 13.0–17.0)
MCH: 33.3 pg (ref 26.0–34.0)
MCHC: 34.6 g/dL (ref 30.0–36.0)
MCV: 96.2 fL (ref 80.0–100.0)
Platelets: 190 K/uL (ref 150–400)
RBC: 3.96 MIL/uL — ABNORMAL LOW (ref 4.22–5.81)
RDW: 14.1 % (ref 11.5–15.5)
WBC: 7.1 K/uL (ref 4.0–10.5)
nRBC: 0 % (ref 0.0–0.2)

## 2024-07-26 LAB — MAGNESIUM: Magnesium: 1.9 mg/dL (ref 1.7–2.4)

## 2024-07-26 MED ORDER — SODIUM CHLORIDE 0.9 % IV SOLN: INTRAVENOUS | Status: AC

## 2024-07-26 MED ORDER — GUAIFENESIN ER 600 MG PO TB12
600.0000 mg | ORAL_TABLET | Freq: Two times a day (BID) | ORAL | Status: DC
Start: 2024-07-26 — End: 2024-07-27
  Administered 2024-07-26 – 2024-07-27 (×3): 600 mg via ORAL
  Filled 2024-07-26 (×3): qty 1

## 2024-07-26 NOTE — TOC CM/SW Note (Signed)
 Transition of Care [facility]) - Inpatient Brief Assessment   Patient Details  Name: Shane [name] MRN: [id number] Date of Birth: 1946/04/26  [facility] of [facility]) CM/SW Contact:    Waddell Barnie Rama, RN Phone Number: 07/26/2024, 1:21 PM   Clinical Narrative: From home with spouse, has PCP and insurance on file, states has no HH services in place at this time , has cpap at home.  States family member  (wife or daughter) will transport them home at [employer] and family is support system, states gets medications from [facility] in [place] and would like for any mes to be sent there.   Pta self ambulatory.   There are no ICM needs identified  at this time.  Please place consult for ICM  needs.     Transition of Care Asessment: Insurance and Status: Insurance coverage has been reviewed Patient has primary care physician: Yes Home environment has been reviewed: home with wife Prior level of function:: indep Prior/Current Home Services: Current home services (cpap) Social Drivers of Health Review: SDOH reviewed no interventions necessary Readmission risk has been reviewed: Yes Transition of care needs: no transition of care needs at this time

## 2024-07-26 NOTE — Progress Notes (Signed)
 PROGRESS NOTE    Shane [name]  FMW:[id number] DOB: August 20, 1946 DOA: 07/23/2024 PCP: Valerio Melanie DASEN, NP   Brief Narrative:  This 78 yrs old Male with PMH significant for combined systolic and diastolic heart failure, paroxysmal A-fib status post ablation on 07/21/2024.  Patient is still on Eliquis  5 mg twice daily as well as amiodarone  100 mg.  Patient presented after 2 days after ablation.  He started spiking fevers at home in last 2 days.  Tmax noted was 103.  Patient has been taking over-the-counter Tylenol  but no subsequent improvement in symptoms.  He has developed some shortness of breath with productive sputum.  Patient reports he was told if he becomes febrile or had any other symptoms,  He should come to directly to the hospital.  In the ED he was febrile.  Chest x-ray demonstrated mild vascular congestion,  borderline cardiomegaly.  UA negative for evidence of infection.  Patient was admitted for possible aspiration pneumonia and started on antibiotics.  Assessment & Plan:   Principal Problem:   Aspiration pneumonia (HCC) Active Problems:   Sepsis (HCC)   Chronic combined systolic and diastolic heart failure (HCC)   OSA (obstructive sleep apnea)   Status post catheter ablation of atrial fibrillation   Presence of cardiac pacemaker   SSS (sick sinus syndrome) (HCC)   Secondary hypercoagulable state  Suspected Sepsis secondary to aspiration pneumonia: Patient presented with fever, hypotension, leukocytosis, cough, shortness of breath. Status post ablation has developed fever and shortness of breath with cough. Patient initiated on IV Zosyn .  Cultures no growth for 3 days Patient is not hypoxic,  not requiring oxygen at this time. Chest x-ray demonstrated some pulmonary congestion. CT chest  shows airspace opacities consistent with pneumonia. Lactic acid 0.8.  Continue IV fluid resuscitation for hypotension. Procalcitonin > 0.10, Patient had fever yesterday.  Chronic  combined systolic and diastolic heart failure: Elevated troponin likely due to demand ischemia: BNP 569.  Troponin elevated but trending down. Patient did have venous congestion demonstrated on chest x-ray.   However he is hypotensive and likely septic. He does not seem in acute exacerbation. Continue gentle IV hydration.  Hold on diuretics at this time. Cardiology is consulted.  Agree with the IV antibiotics.  OSA: Patient wears CPAP at home. Continue CPAP at night.  Atrial fibrillation status post ablation. Patient had a procedure performed on 07/21/2024. Continue telemetry.  Maintaining normal sinus rhythm. Continue amiodarone  400 mg daily Continue Eliquis  5 mg twice daily. Cardiology consulted.  Agree with above management.  Iron deficiency anemia. Continue ferrous sulfate .  Hypokalemia: Replaced.  Continue to monitor  Sick sinus syndrome status post pacemaker:   DVT prophylaxis: Eliquis  Code Status: Full code Family Communication: No family at bed side. Disposition Plan:   Status is: Inpatient Remains inpatient appropriate because: Patient admitted for fever, cough, shortness of breath,  post ablation for A-fib.  Started on empiric antibiotics.  Cardiology was consulted.  Anticipated discharge 11/26 / 25 if no fever in 24 hours.   Consultants:  Cardiology  Procedures:  Antimicrobials:  Anti-infectives (From admission, onward)    Start     Dose/Rate Route Frequency Ordered Stop   07/24/24 1000  piperacillin -tazobactam (ZOSYN ) IVPB 3.375 g        3.375 g 12.5 mL/hr over 240 Minutes Intravenous Every 8 hours 07/23/24 2305     07/23/24 2130  metroNIDAZOLE  (FLAGYL ) IVPB 500 mg        500 mg 100 mL/hr over 60 Minutes Intravenous  Once 07/23/24 2117 07/24/24 0027   07/23/24 2130  ceFEPIme  (MAXIPIME ) 2 g in sodium chloride  0.9 % 100 mL IVPB        2 g 200 mL/hr over 30 Minutes Intravenous  Once 07/23/24 2124 07/23/24 2207   07/23/24 2130  vancomycin  (VANCOREADY)  IVPB 2000 mg/400 mL        2,000 mg 200 mL/hr over 120 Minutes Intravenous  Once 07/23/24 2124 07/24/24 0101      Subjective: Patient was seen and examined at bedside.  Overnight events noted. Patient reports feeling better, He denies any chest pain or shortness of breath. He was sitting comfortably in the chair, completed his breakfast.   Objective: Vitals:   07/25/24 2336 07/26/24 0309 07/26/24 0720 07/26/24 1120  BP: 108/66 (!) 93/49 (!) 108/57 109/64  Pulse: 71 60 62 64  Resp: 18 17 20 20   Temp: 99.1 F (37.3 C) 98.2 F (36.8 C) 99.2 F (37.3 C) 98.2 F (36.8 C)  TempSrc: Oral Oral Oral Oral  SpO2: 100% 94% 95% 100%  Weight:      Height:        Intake/Output Summary (Last 24 hours) at 07/26/2024 1214 Last data filed at 07/25/2024 1855 Gross per 24 hour  Intake 295 ml  Output 375 ml  Net -80 ml   Filed Weights   07/23/24 1956 07/24/24 1502  Weight: 98.4 kg 103 kg    Examination:  General exam: Appears calm and comfortable, deconditioned, not in any acute distress. Respiratory system: CTA Bilaterally. Respiratory effort normal. RR 16 Cardiovascular system: S1 & S2 heard, RRR. No JVD, murmurs, rubs, gallops or clicks.  Gastrointestinal system: Abdomen is non distended, soft and non tender.  Normal bowel sounds heard. Central nervous system: Alert and oriented X 3. No focal neurological deficits. Extremities: No edema, no cyanosis, no clubbing. Skin: No rashes, lesions or ulcers Psychiatry: Judgement and insight appear normal. Mood & affect appropriate.    Data Reviewed: I have personally reviewed following labs and imaging studies  CBC: Recent Labs  Lab 07/23/24 2013 07/24/24 0126 07/26/24 0213  WBC 11.6* 11.6* 7.1  NEUTROABS 9.7* 8.9*  --   HGB 14.9 13.6 13.2  HCT 43.7 40.4 38.1*  MCV 98.6 99.8 96.2  PLT 195 171 190   Basic Metabolic Panel: Recent Labs  Lab 07/23/24 2013 07/24/24 0126 07/26/24 0213  NA 131* 136 138  K 3.5 3.2* 3.6  CL 95*  99 100  CO2 28 27 26   GLUCOSE 93 99 132*  BUN 20 19 19   CREATININE 1.22 1.11 0.82  CALCIUM 8.4* 8.3* 8.3*  MG  --   --  1.9  PHOS  --   --  3.1   GFR: Estimated Creatinine Clearance: 89.3 mL/min (by C-G formula based on SCr of 0.82 mg/dL). Liver Function Tests: Recent Labs  Lab 07/23/24 2013 07/24/24 0126  AST 32 27  ALT 41 35  ALKPHOS 81 72  BILITOT 1.6* 1.6*  PROT 6.7 6.0*  ALBUMIN 3.0* 2.7*   No results for input(s): LIPASE, AMYLASE in the last 168 hours. No results for input(s): AMMONIA in the last 168 hours. Coagulation Profile: Recent Labs  Lab 07/23/24 2013 07/24/24 1516  INR 1.4* 1.5*   Cardiac Enzymes: No results for input(s): CKTOTAL, CKMB, CKMBINDEX, TROPONINI in the last 168 hours. BNP (last 3 results) No results for input(s): PROBNP in the last 8760 hours. HbA1C: No results for input(s): HGBA1C in the last 72 hours. CBG: No results for input(s):  GLUCAP in the last 168 hours. Lipid Profile: No results for input(s): CHOL, HDL, LDLCALC, TRIG, CHOLHDL, LDLDIRECT in the last 72 hours. Thyroid  Function Tests: No results for input(s): TSH, T4TOTAL, FREET4, T3FREE, THYROIDAB in the last 72 hours. Anemia Panel: No results for input(s): VITAMINB12, FOLATE, FERRITIN, TIBC, IRON, RETICCTPCT in the last 72 hours. Sepsis Labs: Recent Labs  Lab 07/23/24 2013 07/23/24 2221 07/25/24 1116  PROCALCITON  --   --  <0.10  LATICACIDVEN 0.8 0.6  --     Recent Results (from the past 240 hours)  Culture, blood (Routine x 2)     Status: None (Preliminary result)   Collection Time: 07/23/24  8:13 PM   Specimen: BLOOD RIGHT ARM  Result Value Ref Range Status   Specimen Description BLOOD RIGHT ARM  Final   Special Requests   Final    BOTTLES DRAWN AEROBIC AND ANAEROBIC Blood Culture results may not be optimal due to an inadequate volume of blood received in culture bottles   Culture   Final    NO GROWTH 3  DAYS Performed at [facility], [address] [place], [place] [address]    Report Status PENDING  Incomplete  Culture, blood (Routine x 2)     Status: None (Preliminary result)   Collection Time: 07/23/24  8:13 PM   Specimen: BLOOD LEFT ARM  Result Value Ref Range Status   Specimen Description BLOOD LEFT ARM  Final   Special Requests   Final    BOTTLES DRAWN AEROBIC ONLY Blood Culture results may not be optimal due to an inadequate volume of blood received in culture bottles   Culture   Final    NO GROWTH 3 DAYS Performed at [facility], [address], [place], [place] [address]    Report Status PENDING  Incomplete  Resp panel by RT-PCR (RSV, Flu A&B, Covid) Anterior Nasal Swab     Status: None   Collection Time: 07/23/24  8:33 PM   Specimen: Anterior Nasal Swab  Result Value Ref Range Status   SARS Coronavirus 2 by RT PCR NEGATIVE NEGATIVE Final   Influenza A by PCR NEGATIVE NEGATIVE Final   Influenza B by PCR NEGATIVE NEGATIVE Final    Comment: (NOTE) The Xpert Xpress SARS-CoV-2/FLU/RSV plus assay is intended as an aid in the diagnosis of influenza from Nasopharyngeal swab specimens and should not be used as a sole basis for treatment. Nasal washings and aspirates are unacceptable for Xpert Xpress SARS-CoV-2/FLU/RSV testing.  Fact Sheet for Patients: bloggercourse.com  Fact Sheet for Healthcare Providers: seriousbroker.it  This test is not yet approved or cleared by the [place]  FDA and has been authorized for detection and/or diagnosis of SARS-CoV-2 by FDA under an Emergency Use Authorization (EUA). This EUA will remain in effect (meaning this test can be used) for the duration of the COVID-19 declaration under Section 564(b)(1) of the Act, 21 U.S.C. section 360bbb-3(b)(1), unless the authorization is terminated or revoked.     Resp Syncytial Virus by PCR NEGATIVE NEGATIVE Final    Comment:  (NOTE) Fact Sheet for Patients: bloggercourse.com  Fact Sheet for Healthcare Providers: seriousbroker.it  This test is not yet approved or cleared by the [place]  FDA and has been authorized for detection and/or diagnosis of SARS-CoV-2 by FDA under an Emergency Use Authorization (EUA). This EUA will remain in effect (meaning this test can be used) for the duration of the COVID-19 declaration under Section 564(b)(1) of the Act, 21 U.S.C. section  360bbb-3(b)(1), unless the authorization is terminated or revoked.  Performed at [facility], [address], [place], [place] [address]     Radiology Studies: No results found.  Scheduled Meds:  allopurinol   300 mg Oral Daily   amiodarone   400 mg Oral Daily   apixaban   5 mg Oral BID   cyanocobalamin   1,000 mcg Oral Daily   ferrous sulfate   325 mg Oral Q0600   guaiFENesin   600 mg Oral BID   Continuous Infusions:  piperacillin -tazobactam 3.375 g (07/26/24 1116)     LOS: 2 days    Time spent: 35 mins    Darcel Dawley, MD [facility]   If 7PM-7AM, please contact night-coverage

## 2024-07-26 NOTE — Plan of Care (Signed)
   Problem: Education: Goal: Knowledge of General Education information will improve Description: Including pain rating scale, medication(s)/side effects and non-pharmacologic comfort measures Outcome: Progressing   Problem: Clinical Measurements: Goal: Ability to maintain clinical measurements within normal limits will improve Outcome: Progressing

## 2024-07-26 NOTE — Progress Notes (Signed)
  Still with fevers overnight.   AF remains quiescent s/p ablation  Continue Eliquis  and amiodarone  and follow up as scheduled.   EP will see as needed while remains here. Please call back with questions or concerns.   Ozell Jodie Passey, PA-C  07/26/2024 7:55 AM

## 2024-07-27 ENCOUNTER — Other Ambulatory Visit (HOSPITAL_COMMUNITY): Payer: Self-pay

## 2024-07-27 DIAGNOSIS — J690 Pneumonitis due to inhalation of food and vomit: Secondary | ICD-10-CM | POA: Diagnosis not present

## 2024-07-27 MED ORDER — AMOXICILLIN-POT CLAVULANATE 875-125 MG PO TABS
1.0000 | ORAL_TABLET | Freq: Two times a day (BID) | ORAL | 0 refills | Status: DC
  Filled 2024-07-27: qty 10, 5d supply, fill #0

## 2024-07-27 NOTE — TOC Transition Note (Signed)
 Transition of Care [facility]) - Discharge Note   Patient Details  Name: Shane [name] MRN: [id number] Date of Birth: [birth year]  Transition of Care [facility]) CM/SW Contact:  Roxie KANDICE Stain, RN Phone Number: 07/27/2024, 12:13 PM   Clinical Narrative:    [name] is stable to discharge home. Follow up apt on [address]. No ICM (Inpatient Care Management) needs at this time.    Final next level of care: Home/Self Care Barriers to Discharge: Barriers Resolved   Patient Goals and CMS Choice Patient states their goals for this hospitalization and ongoing recovery are:: return home          Discharge Placement               Home        Discharge Plan and Services Additional resources added to the After Visit Summary for                                       Social Drivers of Health (SDOH) Interventions SDOH Screenings   Food Insecurity: No Food Insecurity (07/24/2024)  Housing: Low Risk  (07/24/2024)  Transportation Needs: No Transportation Needs (07/24/2024)  Utilities: Not At Risk (07/24/2024)  Alcohol Screen: Low Risk  (06/28/2024)  Depression (PHQ2-9): Low Risk  (07/18/2024)  Financial Resource Strain: Low Risk  (06/24/2024)  Physical Activity: Inactive (06/24/2024)  Social Connections: Moderately Integrated (07/24/2024)  Stress: No Stress Concern Present (06/24/2024)  Tobacco Use: Medium Risk (07/23/2024)  Health Literacy: Adequate Health Literacy (06/28/2024)     Readmission Risk Interventions     No data to display

## 2024-07-27 NOTE — Discharge Summary (Addendum)
 Physician Discharge Summary  Shane [name] FMW:[id number] DOB: [birth year] DOA: 07/23/2024  PCP: Valerio Melanie DASEN, NP  Admit date: 07/23/2024 Discharge date: 07/27/2024 30 Day Unplanned Readmission Risk Score    Flowsheet Row ED to Hosp-Admission (Current) from 07/23/2024 in [facility] 3E HF PCU  30 Day Unplanned Readmission Risk Score (%) 13.01 Filed at 07/27/2024 0801    This score is the patient's risk of an unplanned readmission within 30 days of being discharged (0 -100%). The score is based on dignosis, age, lab data, medications, orders, and past utilization.   Low:  0-14.9   Medium: 15-21.9   High: 22-29.9   Extreme: 30 and above          Admitted From: Home Disposition: Home  Recommendations for Outpatient Follow-up:  Follow up with PCP in 1-2 weeks Please obtain BMP/CBC in one week Please follow up with your PCP on the following pending results: Unresulted Labs (From admission, onward)    None         Home Health: None Equipment/Devices: None  Discharge Condition: Stable CODE STATUS: Full code Diet recommendation:  Diet Order             Diet regular Room service appropriate? Yes; Fluid consistency: Thin  Diet effective now                   Subjective: Seen and examined, feels well, no complaints at all.  Eager to go home.  Brief/Interim Summary: This 78 yrs old Male with PMH significant for combined systolic and diastolic heart failure, paroxysmal A-fib status post ablation on 07/21/2024.  Patient is still on Eliquis  5 mg twice daily as well as amiodarone  100 mg.  Patient presented after 2 days after ablation.  He started spiking fevers at home in last 2 days.  Tmax noted was 103.  Patient has been taking over-the-counter Tylenol  but no subsequent improvement in symptoms.  He has developed some shortness of breath with productive sputum.  Patient reports he was told if he becomes febrile or had any other symptoms,  He should come to  directly to the hospital.  In the ED he was febrile.  Chest x-ray demonstrated mild vascular congestion,  borderline cardiomegaly.  UA negative for evidence of infection.  Patient was admitted for possible aspiration pneumonia and started on antibiotics.  Details below.   Suspected Sepsis secondary to aspiration pneumonia: Patient presented with fever, hypotension, leukocytosis, cough, shortness of breath. Status post ablation has developed fever and shortness of breath with cough. Patient initiated on IV Zosyn .  Cultures no growth for 4 days. Patient is not hypoxic,  not requiring oxygen at this time. Chest x-ray demonstrated some pulmonary congestion. CT chest  shows airspace opacities consistent with pneumonia. Lactic acid 0.8.  Stable for discharge.  Afebrile for more than 24 hours and leukocytosis resolved.  Discharging on 5 days of Augmentin .   Chronic combined systolic and diastolic heart failure: Elevated troponin likely due to demand ischemia: BNP 569.  Troponin elevated but trending down. Patient did have venous congestion demonstrated on chest x-ray.   However he was hypotensive, for that reason, diuretics were held, he was gently hydrated.  Now appears euvolemic.  Discharging on previous medications.  Seen by cardiology, no additional recommendations made.   OSA: Patient wears CPAP at home. Continue CPAP at night.   Atrial fibrillation status post ablation. Patient had a procedure performed on 07/21/2024. Maintaining normal sinus rhythm. Continue amiodarone  400 mg daily Continue  Eliquis  5 mg twice daily. Cardiology consulted.  Agree with above management.   Iron deficiency anemia. Continue ferrous sulfate .   Hypokalemia: Replaced and resolved.   Sick sinus syndrome status post pacemaker:  Discharge plan was discussed with patient and/or family member's daughter over the phone and they verbalized understanding and agreed with it.  Discharge Diagnoses:  Principal  Problem:   Aspiration pneumonia (HCC) Active Problems:   Sepsis (HCC)   Chronic combined systolic and diastolic heart failure (HCC)   OSA (obstructive sleep apnea)   Status post catheter ablation of atrial fibrillation   Presence of cardiac pacemaker   SSS (sick sinus syndrome) (HCC)   Secondary hypercoagulable state    Discharge Instructions   Allergies as of 07/27/2024   No Known Allergies      Medication List     TAKE these medications    allopurinol  300 MG tablet Commonly known as: ZYLOPRIM  Take 1 tablet (300 mg total) by mouth daily.   amiodarone  400 MG tablet Commonly known as: PACERONE  Take 1 tablet (400 mg total) by mouth daily.   amoxicillin -clavulanate 875-125 MG tablet Commonly known as: AUGMENTIN  Take 1 tablet by mouth 2 (two) times daily for 5 days.   BENEFIBER DRINK MIX PO Take 30 mLs by mouth every evening. 2 tablespoons a day   cyanocobalamin  1000 MCG tablet Commonly known as: VITAMIN B12 Take 1,000 mcg by mouth daily.   Eliquis  5 MG Tabs tablet Generic drug: apixaban  Take 1 tablet (5 mg total) by mouth 2 (two) times daily.   erythromycin ophthalmic ointment Place 1 Application into both eyes as needed.   ferrous sulfate  325 (65 FE) MG EC tablet Take 1 tablet (325 mg total) by mouth 2 (two) times daily. What changed: when to take this   Fish Oil 1200 MG Caps Take 1,200 mg by mouth 2 (two) times daily.   furosemide  80 MG tablet Commonly known as: LASIX  Take 1 tablet (80 mg total) by mouth daily.   furosemide  40 MG tablet Commonly known as: LASIX  Take 1 tablet (40 mg total) by mouth daily.   Jardiance  10 MG Tabs tablet Generic drug: empagliflozin  Take 1 tablet (10 mg total) by mouth daily before breakfast.   multivitamin with minerals Tabs tablet Take 1 tablet by mouth daily. Senior   mupirocin ointment 2 % Commonly known as: BACTROBAN Apply 1 Application topically as needed.   potassium chloride  SA 20 MEQ tablet Commonly  known as: KLOR-CON  M Take 1 tablet (20 mEq total) by mouth daily.   PREVAGEN PO Take 1 tablet by mouth daily.   pyridOXINE 100 MG tablet Commonly known as: VITAMIN B6 Take 100 mg by mouth daily.   silver  sulfADIAZINE  1 % cream Commonly known as: Silvadene  Apply 1 Application topically daily.   testosterone  cypionate 200 MG/ML injection Commonly known as: DEPOTESTOSTERONE CYPIONATE Inject 200 mg into the muscle once a week.   triamcinolone cream 0.1 % Commonly known as: KENALOG Apply 1 Application topically as needed.   vitamin C  1000 MG tablet Take 1,000 mg by mouth daily.   VITAMIN D -3 PO Take 2,500 Units by mouth at bedtime.   zinc gluconate 50 MG tablet Take 50 mg by mouth daily.        Follow-up Information     Valerio Moris T, NP Follow up on 08/09/2024.   Specialty: Nurse Practitioner Why: 10 am for hospital follow up Contact information: [address] [place] [address] [phone]  No Known Allergies  Consultations: Cardiology   Procedures/Studies: CT CHEST WO CONTRAST Result Date: 07/23/2024 EXAM: CT CHEST WITHOUT CONTRAST 07/23/2024 11:31:30 PM TECHNIQUE: CT of the chest was performed without the administration of intravenous contrast. Multiplanar reformatted images are provided for review. Automated exposure control, iterative reconstruction, and/or weight based adjustment of the mA/kV was utilized to reduce the radiation dose to as low as reasonably achievable. COMPARISON: Chest x-ray today. CLINICAL HISTORY: Respiratory illness, nondiagnostic xray. FINDINGS: MEDIASTINUM: Pacer wires in the right heart. Heart and pericardium are otherwise unremarkable. The central airways are clear. LYMPH NODES: Mildly enlarged mediastinal lymph nodes. Right paratracheal lymph node with a short axis diameter of 13 mm. Prevascular lymph node with a short-axis diameter of 11 mm. Subcarinal lymph node with a short axis diameter of 13 mm. No axillary  adenopathy. LUNGS AND PLEURA: Airspace opacities medially in the right lower lobe and posteriorly in the left lower lobe concerning for pneumonia. Several nodules along the left minor fissure are stable, likely postinflammatory. No pleural effusion or pneumothorax. SOFT TISSUES/BONES: No acute abnormality of the bones or soft tissues. UPPER ABDOMEN: Limited images of the upper abdomen demonstrate gallstones within the gallbladder measuring up to 3 cm. IMPRESSION: 1. Airspace opacities in the right lower lobe medially and left lower lobe posteriorly, concerning for pneumonia. 2. Mild mediastinal lymphadenopathy involving right paratracheal, prevascular, and subcarinal stations, which may be reactive. Electronically signed by: Franky Crease MD 07/23/2024 11:41 PM EST RP Workstation: [id number]   DG Chest Portable 1 View Result Date: 07/23/2024 EXAM: 1 VIEW(S) XRAY OF THE CHEST 07/23/2024 09:03:00 PM COMPARISON: 05/24/2024 CLINICAL HISTORY: SOB SOB FINDINGS: LINES, TUBES AND DEVICES: Left pacer remains in place, unchanged. LUNGS AND PLEURA: Mild vascular congestion. No pleural effusion. No pneumothorax. HEART AND MEDIASTINUM: Heart is borderline in size. No acute abnormality of the mediastinal silhouette. BONES AND SOFT TISSUES: No acute osseous abnormality. IMPRESSION: 1. Mild vascular congestion. 2. Borderline cardiomegaly. Electronically signed by: Franky Crease MD 07/23/2024 09:07 PM EST RP Workstation: [id number]   EP STUDY Result Date: 07/21/2024 SURGEON:  Soyla Norton, MD PREPROCEDURE DIAGNOSES: 1. Persistent atrial fibrillation. 2. Typical atrial flutter POSTPROCEDURE DIAGNOSES: 1. Persistent atrial fibrillation. 2. Typical atrial flutter PROCEDURES: 1. Comprehensive electrophysiologic study. 2. Coronary sinus pacing and recording. 3. Three-dimensional mapping of atrial fibrillation with additional mapping and ablation of a second discrete focus 4. Ablation of atrial fibrillation with additional mapping and  ablation of a second discrete focus 5. Intracardiac echocardiography. 6. Transseptal puncture of an intact septum. INTRODUCTION:  SANTHOSH GULINO is a 78 y.o. male with a history of persistent atrial fibrillation who now presents for EP study and radiofrequency ablation.  He has had prior ablation but has had more episodes of atrial fibrillation/tachycardia/flutter.  The patient therefore presents today for catheter ablation of atrial fibrillation. DESCRIPTION OF PROCEDURE:  Informed written consent was obtained, and the patient was brought to the electrophysiology lab in a fasting state.  The patient was adequately sedated with intravenous medications as outlined in the anesthesia report.  The patient's left and right groins were prepped and draped in the usual sterile fashion by the EP lab staff.  Using a percutaneous Seldinger technique, 1 8-French hemostasis sheaths were placed in the right femoral vein, and one 7 French and one 9-French hemostasis sheaths were placed into the left common femoral vein. The right femoral vein sheath site was preclosed using an Abbott Perclose and closed at the end of the case. A Biosense [employer] catheter  was advanced into the atrium to make a right atrial map. Direct ultrasound guidance is used for right and left femoral veins with normal vessel patency. Ultrasound images are captured and stored in the patient's chart. Using ultrasound guidance, the Brockenbrough needle and wire were visualized entering the vessel. Catheter Placement:  A 7-French Biosense Webster Decapolar coronary sinus catheter was introduced through the right common femoral vein and advanced into the coronary sinus for recording and pacing from this location.  Initial Measurements: The patient presented to the electrophysiology lab in AV paced rhythm.  Intracardiac Echocardiography: An 8-French Biosense Webster AcuNav intracardiac echocardiography catheter was introduced through the right common  femoral vein and advanced into the right atrium. Intracardiac echocardiography was performed of the left atrium, and a three-dimensional anatomical rendering of the left atrium was performed using CARTO sound technology.  The patient was noted to have a moderate sized left atrium.  The interatrial septum was prominent but not aneurysmal. All 4 pulmonary veins were visualized and noted to have separate ostia.  The pulmonary veins were moderate in size.  The left atrial appendage was visualized and did not reveal thrombus.   There was no evidence of pulmonary vein stenosis. Transseptal Puncture: The right common femoral vein sheath was exchanged for one Visigo sheath and transseptal access was achieved in a standard fashion using a Bayless pigtail wire under fluoroscopy with intracardiac echocardiography confirmation of the transseptal puncture.  Once transseptal access had been achieved, heparin  was administered intravenously and intra- arterially in order to maintain an ACT of greater than 350 seconds throughout the procedure. 3D Mapping and Ablation:  A  Biosense Webster Octarray catheter was advanced into the left atrium through the Visigo sheath.  The Octarray mapping catheter was introduced through the transseptal sheath and positioned over the mouth of all 4 pulmonary veins.  Three-dimensional electroanatomical mapping was performed using CARTO technology.  This demonstrated electrical activity within the carina of the left pulmonary veins and the posterior wall at baseline.  The Varipulse catheter was then placed into the left atrium.  The patient underwent successful sequential electrical isolation of all four pulmonary veins using pulsed field energy with 7 total pulses delivered.  Entrance and exit block were confirmed. Due to persistence of atrial fibrillation and risk of left atrial flutter, ablation using PFA was performed across the posterior wall. The Farawave catheter was removed from the body and the  pentarray catheter was introduced into the left atrium. 3D mapping was performed which confirmed electrical isolation of the pulmonary veins and posterior wall. The patient had prior ablaiton from the right superior pulmonary vein to the mitral valve. There was conduction through that ablation line. Using a [employer] DF ST/SF catheter, ablation was performed from the right superior pulmonary vein to the mitral valve. Conduction time across this line measured 200 msec. The sheath was then pulled back into the right atrial and positioned along the cavo-tricuspid isthmus.  A Biosense Webster DF ST/SF ablation catheter was placed through the sheath into the right atrium. Mapping along the atrial side of the isthmus was performed.  This demonstrated a standard isthmus.  A series of radiofrequency applications were then delivered along the isthmus.  Complete bidirectional cavotricuspid isthmus block was achieved as confirmed by differential atrial pacing from the low lateral right atrium.  A stimulus to earliest atrial activation across the isthmus measured 289 msec bi-directionally.  The patient was observe without return of conduction through the isthmus. Measurements Following Ablation: In sinus  rhythm with RR interval was 1003 msec, with PR 150 msec, QRS 254 msec, and QT 517 msec.  Pacing down to 500/200 did not induce any arrhythmias. No further measurements were done due to the presence of the pacemaker and complete AV block.  Electroisolation was then again confirmed in all four pulmonary veins.  Pacing was performed along the ablation line which confirmed entrance and exit block. The procedure was therefore considered completed.  All catheters were removed, and the sheaths were aspirated and flushed.  40 mg protamine  was given to reverse heparin .  The right femoral vein sheath was removed and closed using a Perclose device.  The left femoral vein sheaths were closed using the Mynx closure device and the  sheaths were removed. The patient was transferred to the recovery area.  Intracardiac echocardiogram revealed no pericardial effusion.  There were no early apparent complications. CONCLUSIONS: 1. Sinus rhythm upon presentation.  2. Successful electrical isolation and anatomical encircling of all four pulmonary veins with pulsed field ablation. 3. Posterior wall isolation using pulse field ablation 4. Cavotricuspid isthmus ablation 5. Ablation from the right upper pulmonary vein to the mitral valve 6. No early apparent complications. Soyla Lunger Camnitz,MD 9:14 AM 07/21/2024   CT CARDIAC MORPH/PULM VEIN W/CM&W/O CA SCORE Addendum Date: 07/02/2024 ADDENDUM REPORT: 07/02/2024 13:40 EXAM: OVER-READ INTERPRETATION  CT CHEST The following report is an over-read performed by radiologist Dr. Ree Levy [facility] on 07/02/2024. This over-read does not include interpretation of cardiac or coronary anatomy or pathology. The coronary CTA interpretation by the cardiologist is attached. COMPARISON:  None. FINDINGS: Mediastinum/Nodes: No solid / cystic mediastinal masses. The visualized esophagus is nondistended precluding optimal assessment. No thoracic lymphadenopathy by size criteria. Lungs/Pleura: Imaged tracheo-bronchial tree is patent. There are dependent changes in bilateral lungs. No mass or consolidation. No pleural effusion or pneumothorax. There are multiple, subcentimeter sized irregular solid noncalcified opacities mainly in the right upper lobe, posterior inferiorly, which are new since the prior study. These are nonspecific and may represent sequela of infection or inflammation. Follow-up examination is recommended in 3 months to document resolution. Upper Abdomen: Visualized upper abdominal viscera within normal limits. Musculoskeletal: The visualized soft tissues of the chest wall are grossly unremarkable. No suspicious osseous lesions. IMPRESSION: *There are multiple, subcentimeter sized  irregular solid noncalcified opacities mainly in the right upper lobe, postero-inferiorly, which are new since the prior study. These are nonspecific and may represent sequela of infection or inflammation. Follow-up examination is recommended in 3 months to document resolution. Electronically Signed   By: Ree Molt M.D.   On: 07/02/2024 13:40   Result Date: 07/02/2024 CLINICAL DATA:  Atrial fibrillation scheduled for ablation. EXAM: Cardiac CTA TECHNIQUE: A non-contrast, gated CT scan was obtained with axial slices of 3 mm through the heart for calcium scoring. Calcium scoring was performed using the Agatston method. A 120 kV retrospective, gated, contrast cardiac scan was obtained. Gantry rotation speed was 250 msecs and collimation was 0.6 mm. Nitroglycerin was not given. A delayed scan was obtained to exclude left atrial appendage thrombus. The 3D dataset was reconstructed in 5% intervals of the 0-95% of the R-R cycle. Late systolic phases were analyzed on a dedicated workstation using MPR, MIP, and VRT modes. The patient received 80 cc of contrast. FINDINGS: Image quality: Excellent, average, poor. Noise artifact is: Limited. Beam artifact present from pacing wires. Pulmonary Veins: There is normal pulmonary vein drainage into the left atrium (3 on the right and 2 on the  left) with ostial measurements as follows: RUPV: Ostium 19.46mm x 14.70mm  area 2.23cm2 RMPV: Ostium 5.56mm x 4.75mm area 0.17cm2 RLPV:  Ostium 12.70mm x 10.47mm  area 0.93cm2 LUPV:  Ostium 18.4mm x 16.3mm area 2.27cm2 LLPV:  Ostium 18.35mm x 14.55mm  area 2.02cm2 Left Atrium: The left atrial size is moderately dilated. There is no PFO/ASD. The left atrial appendage is large broccoli type with two lobes and ostial size 27.7 mm and length 41.2 mm. There is no thrombus in the left atrial appendage on contrast or delayed imaging. The esophagus runs to the left of the atrial midline and is in proximity to left pulmonary vein ostia. Coronary  Arteries: CAC score inaccurate in setting of pacing wires resulting in beam artifact. Normal coronary origin. Right dominance. The study was performed without use of NTG and is insufficient for plaque evaluation. There is mild to moderate calcified plaque in the proximal LAD and diagonals with associated stenosis of 25-49% but could be as high as 50%. Right Atrium: Right atrial size is within normal limits. Right Ventricle: The right ventricular cavity is within normal limits. Left Ventricle: The ventricular cavity size is within normal limits. There are no stigmata of prior infarction. There is no abnormal filling defect. Pericardium: Normal thickness with no significant effusion or calcium present. Pulmonary Artery: Normal caliber without proximal filling defect. Cardiac valves: The aortic valve is trileaflet without significant calcification. The mitral valve is normal structure without significant calcification. Aorta: Mildy dilated ascending aorta measuring 38mm x 37mm at the level of the bifurcation of the main pulmonary artery (double oblique view). Extra-cardiac findings: See attached radiology report for non-cardiac structures. IMPRESSION: 1. There is normal pulmonary vein drainage into the left atrium with ostial measurements above. 2. There is no thrombus in the left atrial appendage. 3. The esophagus runs to the left of the atrial midline and is in proximity to left pulmonary vein ostia. 4. No PFO/ASD. 5. Normal coronary origin. Right dominance. 6. CAC score inaccurate in setting of pacing wires with resultant beam artifact. There is mild to moderate calcified plaque in the proximal LAD and diagonals with associated stenosis of 25-49% but could be as high as 50%. 7. Mildy dilated ascending aorta measuring 38mm x 37mm at the level of the bifurcation of the main pulmonary artery (double oblique view). Wilbert Bihari, MD Electronically Signed: By: Wilbert Bihari M.D. On: 06/27/2024 12:17     Discharge  Exam: Vitals:   07/27/24 0303 07/27/24 0835  BP: 109/67 (!) 109/46  Pulse: 69 (!) 59  Resp: 18 20  Temp: 98.3 F (36.8 C) 97.8 F (36.6 C)  SpO2: 97% 99%   Vitals:   07/26/24 1900 07/26/24 2313 07/27/24 0303 07/27/24 0835  BP: (!) 104/36 110/72 109/67 (!) 109/46  Pulse: (!) 52 (!) 58 69 (!) 59  Resp: 20 19 18 20   Temp: 98.3 F (36.8 C) 98.3 F (36.8 C) 98.3 F (36.8 C) 97.8 F (36.6 C)  TempSrc: Oral Oral Oral Oral  SpO2: 96% 99% 97% 99%  Weight:      Height:        General: Pt is alert, awake, not in acute distress Cardiovascular: RRR, S1/S2 +, no rubs, no gallops Respiratory: Faint rhonchi scattered bilaterally Abdominal: Soft, NT, ND, bowel sounds + Extremities: no edema, no cyanosis    The results of significant diagnostics from this hospitalization (including imaging, microbiology, ancillary and laboratory) are listed below for reference.     Microbiology: Recent Results (from the  past 240 hours)  Culture, blood (Routine x 2)     Status: None (Preliminary result)   Collection Time: 07/23/24  8:13 PM   Specimen: BLOOD RIGHT ARM  Result Value Ref Range Status   Specimen Description BLOOD RIGHT ARM  Final   Special Requests   Final    BOTTLES DRAWN AEROBIC AND ANAEROBIC Blood Culture results may not be optimal due to an inadequate volume of blood received in culture bottles   Culture   Final    NO GROWTH 4 DAYS Performed at [facility], [address], [place], [place] [address]    Report Status PENDING  Incomplete  Culture, blood (Routine x 2)     Status: None (Preliminary result)   Collection Time: 07/23/24  8:13 PM   Specimen: BLOOD LEFT ARM  Result Value Ref Range Status   Specimen Description BLOOD LEFT ARM  Final   Special Requests   Final    BOTTLES DRAWN AEROBIC ONLY Blood Culture results may not be optimal due to an inadequate volume of blood received in culture bottles   Culture   Final    NO GROWTH 4 DAYS Performed at [facility], [address], [place], [place] [address]    Report Status PENDING  Incomplete  Resp panel by RT-PCR (RSV, Flu A&B, Covid) Anterior Nasal Swab     Status: None   Collection Time: 07/23/24  8:33 PM   Specimen: Anterior Nasal Swab  Result Value Ref Range Status   SARS Coronavirus 2 by RT PCR NEGATIVE NEGATIVE Final   Influenza A by PCR NEGATIVE NEGATIVE Final   Influenza B by PCR NEGATIVE NEGATIVE Final    Comment: (NOTE) The Xpert Xpress SARS-CoV-2/FLU/RSV plus assay is intended as an aid in the diagnosis of influenza from Nasopharyngeal swab specimens and should not be used as a sole basis for treatment. Nasal washings and aspirates are unacceptable for Xpert Xpress SARS-CoV-2/FLU/RSV testing.  Fact Sheet for Patients: bloggercourse.com  Fact Sheet for Healthcare Providers: seriousbroker.it  This test is not yet approved or cleared by the [place]  FDA and has been authorized for detection and/or diagnosis of SARS-CoV-2 by FDA under an Emergency Use Authorization (EUA). This EUA will remain in effect (meaning this test can be used) for the duration of the COVID-19 declaration under Section 564(b)(1) of the Act, 21 U.S.C. section 360bbb-3(b)(1), unless the authorization is terminated or revoked.     Resp Syncytial Virus by PCR NEGATIVE NEGATIVE Final    Comment: (NOTE) Fact Sheet for Patients: bloggercourse.com  Fact Sheet for Healthcare Providers: seriousbroker.it  This test is not yet approved or cleared by the [place]  FDA and has been authorized for detection and/or diagnosis of SARS-CoV-2 by FDA under an Emergency Use Authorization (EUA). This EUA will remain in effect (meaning this test can be used) for the duration of the COVID-19 declaration under Section 564(b)(1) of the Act, 21 U.S.C. section 360bbb-3(b)(1), unless the authorization is terminated  or revoked.  Performed at [facility], [address], [place], [place] [address]      Labs: BNP (last 3 results) Recent Labs    03/24/24 1116 07/23/24 2118  BNP 477.5* 569.7*   Basic Metabolic Panel: Recent Labs  Lab 07/23/24 2013 07/24/24 0126 07/26/24 0213  NA 131* 136 138  K 3.5 3.2* 3.6  CL 95* 99 100  CO2 28 27 26   GLUCOSE 93 99 132*  BUN 20 19 19   CREATININE  1.22 1.11 0.82  CALCIUM 8.4* 8.3* 8.3*  MG  --   --  1.9  PHOS  --   --  3.1   Liver Function Tests: Recent Labs  Lab 07/23/24 2013 07/24/24 0126  AST 32 27  ALT 41 35  ALKPHOS 81 72  BILITOT 1.6* 1.6*  PROT 6.7 6.0*  ALBUMIN 3.0* 2.7*   No results for input(s): LIPASE, AMYLASE in the last 168 hours. No results for input(s): AMMONIA in the last 168 hours. CBC: Recent Labs  Lab 07/23/24 2013 07/24/24 0126 07/26/24 0213  WBC 11.6* 11.6* 7.1  NEUTROABS 9.7* 8.9*  --   HGB 14.9 13.6 13.2  HCT 43.7 40.4 38.1*  MCV 98.6 99.8 96.2  PLT 195 171 190   Cardiac Enzymes: No results for input(s): CKTOTAL, CKMB, CKMBINDEX, TROPONINI in the last 168 hours. BNP: Invalid input(s): POCBNP CBG: No results for input(s): GLUCAP in the last 168 hours. D-Dimer No results for input(s): DDIMER in the last 72 hours. Hgb A1c No results for input(s): HGBA1C in the last 72 hours. Lipid Profile No results for input(s): CHOL, HDL, LDLCALC, TRIG, CHOLHDL, LDLDIRECT in the last 72 hours. Thyroid  function studies No results for input(s): TSH, T4TOTAL, T3FREE, THYROIDAB in the last 72 hours.  Invalid input(s): FREET3 Anemia work up No results for input(s): VITAMINB12, FOLATE, FERRITIN, TIBC, IRON, RETICCTPCT in the last 72 hours. Urinalysis    Component Value Date/Time   COLORURINE AMBER (A) 07/23/2024 2016   APPEARANCEUR CLEAR 07/23/2024 2016   LABSPEC 1.020 07/23/2024 2016   PHURINE 5.0 07/23/2024 2016   GLUCOSEU >=500 (A) 07/23/2024 2016    HGBUR MODERATE (A) 07/23/2024 2016   BILIRUBINUR NEGATIVE 07/23/2024 2016   KETONESUR NEGATIVE 07/23/2024 2016   PROTEINUR 30 (A) 07/23/2024 2016   NITRITE NEGATIVE 07/23/2024 2016   LEUKOCYTESUR NEGATIVE 07/23/2024 2016   Sepsis Labs Recent Labs  Lab 07/23/24 2013 07/24/24 0126 07/26/24 0213  WBC 11.6* 11.6* 7.1   Microbiology Recent Results (from the past 240 hours)  Culture, blood (Routine x 2)     Status: None (Preliminary result)   Collection Time: 07/23/24  8:13 PM   Specimen: BLOOD RIGHT ARM  Result Value Ref Range Status   Specimen Description BLOOD RIGHT ARM  Final   Special Requests   Final    BOTTLES DRAWN AEROBIC AND ANAEROBIC Blood Culture results may not be optimal due to an inadequate volume of blood received in culture bottles   Culture   Final    NO GROWTH 4 DAYS Performed at [facility], [address], [place], [place] [address]    Report Status PENDING  Incomplete  Culture, blood (Routine x 2)     Status: None (Preliminary result)   Collection Time: 07/23/24  8:13 PM   Specimen: BLOOD LEFT ARM  Result Value Ref Range Status   Specimen Description BLOOD LEFT ARM  Final   Special Requests   Final    BOTTLES DRAWN AEROBIC ONLY Blood Culture results may not be optimal due to an inadequate volume of blood received in culture bottles   Culture   Final    NO GROWTH 4 DAYS Performed at [facility], [address], [place], [place] [address]    Report Status PENDING  Incomplete  Resp panel by RT-PCR (RSV, Flu A&B, Covid) Anterior Nasal Swab     Status: None   Collection Time: 07/23/24  8:33 PM   Specimen: Anterior Nasal Swab  Result Value  Ref Range Status   SARS Coronavirus 2 by RT PCR NEGATIVE NEGATIVE Final   Influenza A by PCR NEGATIVE NEGATIVE Final   Influenza B by PCR NEGATIVE NEGATIVE Final    Comment: (NOTE) The Xpert Xpress SARS-CoV-2/FLU/RSV plus assay is intended as an aid in the diagnosis of influenza from Nasopharyngeal swab  specimens and should not be used as a sole basis for treatment. Nasal washings and aspirates are unacceptable for Xpert Xpress SARS-CoV-2/FLU/RSV testing.  Fact Sheet for Patients: bloggercourse.com  Fact Sheet for Healthcare Providers: seriousbroker.it  This test is not yet approved or cleared by the [place]  FDA and has been authorized for detection and/or diagnosis of SARS-CoV-2 by FDA under an Emergency Use Authorization (EUA). This EUA will remain in effect (meaning this test can be used) for the duration of the COVID-19 declaration under Section 564(b)(1) of the Act, 21 U.S.C. section 360bbb-3(b)(1), unless the authorization is terminated or revoked.     Resp Syncytial Virus by PCR NEGATIVE NEGATIVE Final    Comment: (NOTE) Fact Sheet for Patients: bloggercourse.com  Fact Sheet for Healthcare Providers: seriousbroker.it  This test is not yet approved or cleared by the [place]  FDA and has been authorized for detection and/or diagnosis of SARS-CoV-2 by FDA under an Emergency Use Authorization (EUA). This EUA will remain in effect (meaning this test can be used) for the duration of the COVID-19 declaration under Section 564(b)(1) of the Act, 21 U.S.C. section 360bbb-3(b)(1), unless the authorization is terminated or revoked.  Performed at [facility], [address] [place], [place] [address]     FURTHER DISCHARGE INSTRUCTIONS:   Get Medicines reviewed and adjusted: Please take all your medications with you for your next visit with your Primary MD   Laboratory/radiological data: Please request your Primary MD to go over all hospital tests and procedure/radiological results at the follow up, please ask your Primary MD to get all Hospital records sent to his/her office.   In some cases, they will be blood work, cultures and biopsy results pending at  the time of your discharge. Please request that your primary care M.D. goes through all the records of your hospital data and follows up on these results.   Also Note the following: If you experience worsening of your admission symptoms, develop shortness of breath, life threatening emergency, suicidal or homicidal thoughts you must seek medical attention immediately by calling 911 or calling your MD immediately  if symptoms less severe.   You must read complete instructions/literature along with all the possible adverse reactions/side effects for all the Medicines you take and that have been prescribed to you. Take any new Medicines after you have completely understood and accpet all the possible adverse reactions/side effects.    patient was instructed, not to drive, operate heavy machinery, perform activities at heights, swimming or participation in water activities or provide baby-sitting services while on Pain, Sleep and Anxiety Medications; until their outpatient Physician has advised to do so again. Also recommended to not to take more than prescribed Pain, Sleep and Anxiety Medications.  It is not advisable to combine anxiety, sleep and pain medications without talking with your primary care provider.     Wear Seat belts while driving.   Please note: You were cared for by a hospitalist during your hospital stay. Once you are discharged, your primary care physician will handle any further medical issues. Please note that NO REFILLS for any discharge medications will be authorized once you are discharged,  as it is imperative that you return to your primary care physician (or establish a relationship with a primary care physician if you do not have one) for your post hospital discharge needs so that they can reassess your need for medications and monitor your lab values  Time coordinating discharge: Over 30 minutes  SIGNED:   Fredia Skeeter, MD  [facility] 07/27/2024, 11:05  AM *Please note that this is a verbal dictation therefore any spelling or grammatical errors are due to the Dragon Medical One system interpretation. If 7PM-7AM, please contact night-coverage [email or web address]

## 2024-07-28 LAB — CULTURE, BLOOD (ROUTINE X 2)
Culture: NO GROWTH
Culture: NO GROWTH

## 2024-07-30 NOTE — Patient Instructions (Signed)
 Be Involved in Caring For Your Health:  Taking Medications When medications are taken as directed, they can greatly improve your health. But if they are not taken as prescribed, they may not work. In some cases, not taking them correctly can be harmful. To help ensure your treatment remains effective and safe, understand your medications and how to take them. Bring your medications to each visit for review by your provider.  Your lab results, notes, and after visit summary will be available on My Chart. We strongly encourage you to use this feature. If lab results are abnormal the clinic will contact you with the appropriate steps. If the clinic does not contact you assume the results are satisfactory. You can always view your results on My Chart. If you have questions regarding your health or results, please contact the clinic during office hours. You can also ask questions on My Chart.  We at [facility] are grateful that you chose [place] to provide your care. We strive to provide evidence-based and compassionate care and are always looking for feedback. If you get a survey from the clinic please complete this so we can hear your opinions.  Community-Acquired Pneumonia, Adult Pneumonia is an infection of the lungs. It causes irritation and swelling in the airways of the lungs. Mucus and fluid may also build up inside the airways. This may cause coughing and trouble breathing. One type of pneumonia can happen while you are in a hospital. A different type can happen when you are not in a hospital (community-acquired pneumonia). What are the causes?  This condition is caused by germs (viruses, bacteria, or fungi). Some types of germs can spread from person to person. Pneumonia is not thought to spread from person to person. What increases the risk? You have a long-term (chronic) disease, such as: Disease of the lungs. This may be chronic obstructive pulmonary disease (COPD) or asthma. Heart  failure. Cystic fibrosis. Diabetes. Kidney disease. Sickle cell disease. HIV. You have other health problems, such as: Your body's defense system (immune system) is weak. A condition that may cause you to breathe in fluids from your mouth and nose. You had your spleen taken out. You do not take good care of your teeth and mouth (poor dental hygiene). You use or have used tobacco products. You go where the germs that cause this illness are common. You are older than 80 years of age. What are the signs or symptoms? A cough. A fever. Sweating or chills. Chest pain, often when you breathe deeply or cough. Breathing problems, such as: Fast breathing. Trouble breathing. Shortness of breath. Feeling tired (fatigued). Muscle aches. How is this treated? Treatment for this condition depends on many things, such as: The cause of your illness. Your medicines. Your other health problems. Most adults can be treated at home. Sometimes, treatment must happen in a hospital. Treatment may include medicines to kill germs. Medicines may depend on which germ caused your illness. Very bad pneumonia is rare. If you get it, you may: Have a machine to help you breathe. Have fluid taken away from around your lungs. Follow these instructions at home: Medicines Take over-the-counter and prescription medicines only as told by your doctor. Take cough medicine only if you are losing sleep. Cough medicine can keep your body from taking mucus away from your lungs. If you were prescribed antibiotics, take them as told by your doctor. Do not stop taking them even if you start to feel better. Lifestyle  Do not smoke or use any products that contain nicotine or tobacco. If you need help quitting, ask your doctor. Do not drink alcohol. Eat a healthy diet. This includes a lot of vegetables, fruits, whole grains, low-fat dairy products, and low-fat (lean) protein. General instructions  Rest a lot. Sleep  for at least 8 hours each night. Sleep with your head and neck raised. Put a few pillows under your head or sleep in a reclining chair. Return to your normal activities as told by your doctor. Ask your doctor what activities are safe for you. Drink enough fluid to keep your pee (urine) pale yellow. If your throat is sore, gargle with a mixture of salt and water 3-4 times a day or as needed. To make salt water, completely dissolve -1 tsp (3-6 g) of salt in 1 cup (237 mL) of warm water. Keep all follow-up visits. How is this prevented? Getting the pneumonia shot (vaccine). These shots have different types and schedules. Ask your doctor what works best for you. Think about getting this shot if: You are older than 80 years of age. You are 25-63 years of age and: You are being treated for cancer. You have long-term lung disease. You have other problems that affect your body's defense system. Ask your doctor if you have one of these. Getting your flu shot every year. Ask your doctor which type of shot is best for you. Going to the dentist as often as told. Washing your hands often with soap and water for at least 20 seconds. If you cannot use soap and water, use hand sanitizer. Contact a doctor if: You have a fever. You lose sleep because your cough medicine does not help. Get help right away if: You are short of breath and this gets worse. You have more chest pain. Your sickness gets worse. This is very serious if: You are an older adult. Your body's defense system is weak. You cough up blood. These symptoms may be an emergency. Get help right away. Call 911. Do not wait to see if the symptoms will go away. Do not drive yourself to the hospital. Summary Pneumonia is an infection of the lungs. Community-acquired pneumonia affects people who have not been in the hospital. Certain germs can cause this infection. This condition may be treated with medicines that kill germs. For very bad  pneumonia, you may need a hospital stay and treatment to help with breathing. This information is not intended to replace advice given to you by your health care provider. Make sure you discuss any questions you have with your health care provider. Document Revised: 10/16/2021 Document Reviewed: 10/16/2021 Elsevier Patient Education  2024 [employer].

## 2024-08-08 ENCOUNTER — Other Ambulatory Visit: Payer: Self-pay | Admitting: Cardiovascular Disease

## 2024-08-08 DIAGNOSIS — Z01812 Encounter for preprocedural laboratory examination: Secondary | ICD-10-CM

## 2024-08-08 DIAGNOSIS — I4819 Other persistent atrial fibrillation: Secondary | ICD-10-CM

## 2024-08-08 NOTE — Telephone Encounter (Signed)
 Eliquis  mg refill request received. Patient is 78 years old, weight-103kg, Crea-0.82 on 07/26/24, Diagnosis-Afib/Aflutter, and last seen by Dr. Francyne on 06/02/24. Dose is appropriate based on dosing criteria. Will send in refill to requested pharmacy.

## 2024-08-09 ENCOUNTER — Ambulatory Visit (INDEPENDENT_AMBULATORY_CARE_PROVIDER_SITE_OTHER): Admitting: Nurse Practitioner

## 2024-08-09 ENCOUNTER — Encounter: Payer: Self-pay | Admitting: Nurse Practitioner

## 2024-08-09 VITALS — BP 102/63 | HR 61 | Temp 98.6°F | Resp 16 | Ht 70.98 in | Wt 218.8 lb

## 2024-08-09 DIAGNOSIS — Z1159 Encounter for screening for other viral diseases: Secondary | ICD-10-CM

## 2024-08-09 DIAGNOSIS — A403 Sepsis due to Streptococcus pneumoniae: Secondary | ICD-10-CM

## 2024-08-09 DIAGNOSIS — D500 Iron deficiency anemia secondary to blood loss (chronic): Secondary | ICD-10-CM

## 2024-08-09 DIAGNOSIS — E782 Mixed hyperlipidemia: Secondary | ICD-10-CM

## 2024-08-09 DIAGNOSIS — I5042 Chronic combined systolic (congestive) and diastolic (congestive) heart failure: Secondary | ICD-10-CM

## 2024-08-09 DIAGNOSIS — I484 Atypical atrial flutter: Secondary | ICD-10-CM

## 2024-08-09 DIAGNOSIS — J690 Pneumonitis due to inhalation of food and vomit: Secondary | ICD-10-CM

## 2024-08-09 DIAGNOSIS — Z950 Presence of cardiac pacemaker: Secondary | ICD-10-CM

## 2024-08-09 MED ORDER — AMOXICILLIN-POT CLAVULANATE 875-125 MG PO TABS: 1.0000 | ORAL_TABLET | Freq: Two times a day (BID) | ORAL | 0 refills | Status: AC

## 2024-08-09 NOTE — Assessment & Plan Note (Signed)
 Chronic, stable.  Euvolemic today. Continue current medication regimen as ordered by cardiology and collaboration.  Recent notes reviewed. Recommend: - Reminded to call for an overnight weight gain of >2 pounds or a weekly weight gain of >5 pounds - not adding salt to food and read food labels. Reviewed the importance of keeping daily sodium intake to 2000mg  daily.  - Avoid Ibuprofen products.

## 2024-08-09 NOTE — Assessment & Plan Note (Signed)
 Chronic, ongoing.  Remain off statin therapy, was discontinued by cardiology in past. Continue collaboration with cardiology. Lipid panel today.

## 2024-08-09 NOTE — Assessment & Plan Note (Signed)
 Acute, post ablation on 07/21/24. Overall improving, but still some weakness and occasional SOB reported. A few scattered rales BLL on exam. Will sent in 5 more days Augmentin  for coverage = 10 full days total. Continue Mucinex  at home as needed. Will plan on repeat imaging in 3 weeks, discussed with him and he is aware where to obtain this. To monitor O2 sats at home, goal >90% on RA, and temperature. If any fevers or consistent low sats present to immediately go to ER or alert PCP.

## 2024-08-09 NOTE — Assessment & Plan Note (Signed)
 Chronic, ongoing.  Followed by cardiology. Ablation performed on 07/21/24. Continue current collaboration and medication regimen as ordered.  Recent notes reviewed. Regular rhythm noted on exam today.

## 2024-08-09 NOTE — Progress Notes (Signed)
 BP 102/63 (BP Location: Left Arm, Patient Position: Sitting, Cuff Size: Normal)   Pulse 61   Temp 98.6 F (37 C) (Oral)   Resp 16   Ht 5' 10.98 (1.803 m)   Wt 218 lb 12.8 oz (99.2 kg)   SpO2 96%   BMI 30.53 kg/m    Subjective:    Patient ID: Shane [name], male    DOB: 08/09/1946, 78 y.o.   MRN: [id number]  HPI: Shane [name] is a 78 y.o. male  Chief Complaint  Patient presents with   Hospitalization Follow-up    Aspiration pneumonia- Since being home if says if he could get his strength back he would be doing a lot better. Unable to take a deep breathe and hold it but could prior. Also feels there is still something in his chest.    Transition of Care Hospital Follow up.  Presents for hospital follow-up after admission for aspiration pneumonia post ablation for a-fib on 07/21/24. Symptoms started 2 days after ablation with fevers at home. Treated with IV Zosyn  and sputum culture showed no growth on day 5. Was discharged on 5 days of Augmentin . Is still feeling weak post discharge. Took Mucinex  daily until Sunday because was having trouble sleeping. Still struggles with really deep breaths and gets SOB easily. Some coughing still present, no production.  Was not sent home with PT or OT. Returns to A-Fib clinic on the 18th.  Brief/Interim Summary: This 78 yrs old Male with PMH significant for combined systolic and diastolic heart failure, paroxysmal A-fib status post ablation on 07/21/2024.  Patient is still on Eliquis  5 mg twice daily as well as amiodarone  100 mg.  Patient presented after 2 days after ablation.  He started spiking fevers at home in last 2 days.  Tmax noted was 103.  Patient has been taking over-the-counter Tylenol  but no subsequent improvement in symptoms.  He has developed some shortness of breath with productive sputum.  Patient reports he was told if he becomes febrile or had any other symptoms,  He should come to directly to the hospital.  In the ED he was  febrile.  Chest x-ray demonstrated mild vascular congestion,  borderline cardiomegaly.  UA negative for evidence of infection.  Patient was admitted for possible aspiration pneumonia and started on antibiotics.  Details below.   Suspected Sepsis secondary to aspiration pneumonia: Patient presented with fever, hypotension, leukocytosis, cough, shortness of breath. Status post ablation has developed fever and shortness of breath with cough. Patient initiated on IV Zosyn .  Cultures no growth for 4 days. Patient is not hypoxic,  not requiring oxygen at this time. Chest x-ray demonstrated some pulmonary congestion. CT chest  shows airspace opacities consistent with pneumonia. Lactic acid 0.8.  Stable for discharge.  Afebrile for more than 24 hours and leukocytosis resolved.  Discharging on 5 days of Augmentin .   Chronic combined systolic and diastolic heart failure: Elevated troponin likely due to demand ischemia: BNP 569.  Troponin elevated but trending down. Patient did have venous congestion demonstrated on chest x-ray.   However he was hypotensive, for that reason, diuretics were held, he was gently hydrated.  Now appears euvolemic.  Discharging on previous medications.  Seen by cardiology, no additional recommendations made.   OSA: Patient wears CPAP at home. Continue CPAP at night.   Atrial fibrillation status post ablation. Patient had a procedure performed on 07/21/2024. Maintaining normal sinus rhythm. Continue amiodarone  400 mg daily Continue Eliquis  5 mg twice daily. Cardiology  consulted.  Agree with above management.   Iron deficiency anemia. Continue ferrous sulfate .   Hypokalemia: Replaced and resolved.   Sick sinus syndrome status post pacemaker  Hospital/Facility: [facility] Physician: Dr. Vernon D/C Date: 07/27/24  Records Requested: 08/09/24 Records Received: 08/09/24 Records Reviewed: 08/09/24  Diagnoses on Discharge: Aspiration Pneumonia and  Sepsis  Date of interactive Contact within 48 hours of discharge:  Contact was through: none  Date of 7 day or 14 day face-to-face visit:    within 14 days  Outpatient Encounter Medications as of 08/09/2024  Medication Sig   allopurinol  (ZYLOPRIM ) 300 MG tablet Take 1 tablet (300 mg total) by mouth daily.   amiodarone  (PACERONE ) 400 MG tablet Take 1 tablet (400 mg total) by mouth daily.   amoxicillin -clavulanate (AUGMENTIN ) 875-125 MG tablet Take 1 tablet by mouth 2 (two) times daily for 5 days.   apixaban  (ELIQUIS ) 5 MG TABS tablet Take 1 tablet (5 mg total) by mouth 2 (two) times daily.   Apoaequorin (PREVAGEN PO) Take 1 tablet by mouth daily.   Ascorbic Acid  (VITAMIN C ) 1000 MG tablet Take 1,000 mg by mouth daily.   Cholecalciferol  (VITAMIN D -3 PO) Take 2,500 Units by mouth at bedtime.   empagliflozin  (JARDIANCE ) 10 MG TABS tablet Take 1 tablet (10 mg total) by mouth daily before breakfast.   erythromycin ophthalmic ointment Place 1 Application into both eyes as needed.   ferrous sulfate  325 (65 FE) MG EC tablet Take 1 tablet (325 mg total) by mouth 2 (two) times daily. (Patient taking differently: Take 325 mg by mouth daily at 6 (six) AM.)   furosemide  (LASIX ) 40 MG tablet Take 1 tablet (40 mg total) by mouth daily.   furosemide  (LASIX ) 80 MG tablet Take 1 tablet (80 mg total) by mouth daily.   Multiple Vitamin (MULTIVITAMIN WITH MINERALS) TABS tablet Take 1 tablet by mouth daily. Senior   mupirocin ointment (BACTROBAN) 2 % Apply 1 Application topically as needed.   Omega-3 Fatty Acids (FISH OIL) 1200 MG CAPS Take 1,200 mg by mouth 2 (two) times daily.    potassium chloride  SA (KLOR-CON  M) 20 MEQ tablet Take 1 tablet (20 mEq total) by mouth daily.   pyridOXINE (VITAMIN B-6) 100 MG tablet Take 100 mg by mouth daily.   silver  sulfADIAZINE  (SILVADENE ) 1 % cream Apply 1 Application topically daily.   tamsulosin  (FLOMAX ) 0.4 MG CAPS capsule Take 0.4 mg by mouth daily.   testosterone   cypionate (DEPOTESTOSTERONE CYPIONATE) 200 MG/ML injection Inject 200 mg into the muscle once a week.   triamcinolone cream (KENALOG) 0.1 % Apply 1 Application topically as needed.   vitamin B-12 (CYANOCOBALAMIN ) 1000 MCG tablet Take 1,000 mcg by mouth daily.   Wheat Dextrin (BENEFIBER DRINK MIX PO) Take 30 mLs by mouth every evening. 2 tablespoons a day   zinc gluconate 50 MG tablet Take 50 mg by mouth daily.   [DISCONTINUED] amoxicillin -clavulanate (AUGMENTIN ) 875-125 MG tablet Take 1 tablet by mouth 2 (two) times daily for 5 days.   [DISCONTINUED] apixaban  (ELIQUIS ) 5 MG TABS tablet Take 1 tablet (5 mg total) by mouth 2 (two) times daily.   No facility-administered encounter medications on file as of 08/09/2024.   Diagnostic Tests Reviewed/Disposition:     Latest Ref Rng & Units 07/26/2024    2:13 AM 07/24/2024    1:26 AM 07/23/2024    8:13 PM  CBC  WBC 4.0 - 10.5 K/uL 7.1  11.6  11.6   Hemoglobin 13.0 - 17.0 g/dL 13.2  13.6  14.9   Hematocrit 39.0 - 52.0 % 38.1  40.4  43.7   Platelets 150 - 400 K/uL 190  171  195     CMP     Component Value Date/Time   NA 138 07/26/2024 0213   NA 138 06/28/2024 1333   K 3.6 07/26/2024 0213   CL 100 07/26/2024 0213   CO2 26 07/26/2024 0213   GLUCOSE 132 (H) 07/26/2024 0213   BUN 19 07/26/2024 0213   BUN 23 06/28/2024 1333   CREATININE 0.82 07/26/2024 0213   CREATININE 0.99 10/04/2019 1425   CALCIUM 8.3 (L) 07/26/2024 0213   PROT 6.0 (L) 07/24/2024 0126   PROT 7.4 03/24/2024 1116   ALBUMIN 2.7 (L) 07/24/2024 0126   ALBUMIN 4.1 03/24/2024 1116   AST 27 07/24/2024 0126   ALT 35 07/24/2024 0126   ALKPHOS 72 07/24/2024 0126   BILITOT 1.6 (H) 07/24/2024 0126   BILITOT 1.0 03/24/2024 1116   EGFR 60 06/28/2024 1333   GFRNONAA >60 07/26/2024 0213   GFRNONAA 74 10/04/2019 1425    07/23/24 CT CHEST: IMPRESSION: 1. Airspace opacities in the right lower lobe medially and left lower lobe posteriorly, concerning for pneumonia. 2. Mild  mediastinal lymphadenopathy involving right paratracheal, prevascular, and subcarinal stations, which may be reactive.  Consults: Cardiology  Discharge Instructions: Follow up with PCP in 1-2 weeks Please obtain BMP/CBC in one week  Disease/illness Education: Reviewed with patient at length today  Home Health/Community Services Discussions/Referrals: None  Establishment or re-establishment of referral orders for community resources: none  Discussion with other health care providers: Reviewed all recent notes  Assessment and Support of treatment regimen adherence: Reviewed with patient at length today  Appointments Coordinated with: Established with cardiology and has upcoming follow-up visits  Education for self-management, independent living, and ADLs:  Reviewed with patient at length today  Relevant past medical, surgical, family and social history reviewed and updated as indicated. Interim medical history since our last visit reviewed. Allergies and medications reviewed and updated.  Review of Systems  Constitutional:  Positive for activity change (a little weaker) and fatigue. Negative for appetite change, diaphoresis and fever.  HENT:  Positive for postnasal drip and rhinorrhea.   Respiratory:  Positive for cough (mild) and shortness of breath (occasional). Negative for chest tightness and wheezing.   Cardiovascular:  Positive for leg swelling (occasional at sock line, but improved since starting back on Lasix ). Negative for chest pain.  Gastrointestinal: Negative.   Neurological:  Positive for weakness. Negative for dizziness, speech difficulty, numbness and headaches.  Psychiatric/Behavioral: Negative.      Per HPI unless specifically indicated above     Objective:    BP 102/63 (BP Location: Left Arm, Patient Position: Sitting, Cuff Size: Normal)   Pulse 61   Temp 98.6 F (37 C) (Oral)   Resp 16   Ht 5' 10.98 (1.803 m)   Wt 218 lb 12.8 oz (99.2 kg)   SpO2 96%    BMI 30.53 kg/m   Wt Readings from Last 3 Encounters:  08/09/24 218 lb 12.8 oz (99.2 kg)  07/24/24 227 lb 1.2 oz (103 kg)  07/21/24 217 lb (98.4 kg)    Physical Exam Vitals and nursing note reviewed.  Constitutional:      General: He is awake. He is not in acute distress.    Appearance: He is well-developed and well-groomed. He is not ill-appearing or toxic-appearing.  HENT:     Head: Normocephalic.     Right  Ear: Hearing, ear canal and external ear normal. A middle ear effusion is present. There is no impacted cerumen. Tympanic membrane is not injected.     Left Ear: Hearing, ear canal and external ear normal. A middle ear effusion is present. There is no impacted cerumen. Tympanic membrane is not injected.     Nose: Nose normal. No congestion or rhinorrhea.     Right Sinus: No maxillary sinus tenderness or frontal sinus tenderness.     Left Sinus: No maxillary sinus tenderness or frontal sinus tenderness.     Mouth/Throat:     Mouth: Mucous membranes are moist.     Pharynx: No pharyngeal swelling, oropharyngeal exudate or posterior oropharyngeal erythema.  Eyes:     General: Lids are normal.     Extraocular Movements: Extraocular movements intact.     Conjunctiva/sclera: Conjunctivae normal.  Neck:     Thyroid : No thyromegaly.     Vascular: No carotid bruit.  Cardiovascular:     Rate and Rhythm: Normal rate and regular rhythm.     Heart sounds: Normal heart sounds. No murmur heard.    No gallop.  Pulmonary:     Effort: Pulmonary effort is normal. No accessory muscle usage or respiratory distress.     Breath sounds: Rales present. No decreased breath sounds or wheezing.     Comments: A few scattered rales lower lobes. No SOB with talking noted. Abdominal:     General: Bowel sounds are normal. There is no distension.     Palpations: Abdomen is soft.     Tenderness: There is no abdominal tenderness.  Musculoskeletal:     Cervical back: Full passive range of motion without  pain.     Right lower leg: No edema.     Left lower leg: No edema.  Lymphadenopathy:     Cervical: No cervical adenopathy.  Skin:    General: Skin is warm.     Capillary Refill: Capillary refill takes less than 2 seconds.  Neurological:     Mental Status: He is alert and oriented to person, place, and time.     Deep Tendon Reflexes: Reflexes are normal and symmetric.     Reflex Scores:      Brachioradialis reflexes are 2+ on the right side and 2+ on the left side.      Patellar reflexes are 2+ on the right side and 2+ on the left side. Psychiatric:        Attention and Perception: Attention normal.        Mood and Affect: Mood normal.        Speech: Speech normal.        Behavior: Behavior normal. Behavior is cooperative.        Thought Content: Thought content normal.    Results for orders placed or performed during the hospital encounter of 07/23/24  Culture, blood (Routine x 2)   Collection Time: 07/23/24  8:13 PM   Specimen: BLOOD RIGHT ARM  Result Value Ref Range   Specimen Description BLOOD RIGHT ARM    Special Requests      BOTTLES DRAWN AEROBIC AND ANAEROBIC Blood Culture results may not be optimal due to an inadequate volume of blood received in culture bottles   Culture      NO GROWTH 5 DAYS Performed at [facility], [address], [place], [place] [address]    Report Status 07/28/2024 FINAL   Culture, blood (Routine x 2)   Collection Time: 07/23/24  8:13  PM   Specimen: BLOOD LEFT ARM  Result Value Ref Range   Specimen Description BLOOD LEFT ARM    Special Requests      BOTTLES DRAWN AEROBIC ONLY Blood Culture results may not be optimal due to an inadequate volume of blood received in culture bottles   Culture      NO GROWTH 5 DAYS Performed at [facility], [address], [place], [place] [address]    Report Status 07/28/2024 FINAL   Comprehensive metabolic panel   Collection Time: 07/23/24  8:13 PM  Result Value Ref Range   Sodium 131 (L)  135 - 145 mmol/L   Potassium 3.5 3.5 - 5.1 mmol/L   Chloride 95 (L) 98 - 111 mmol/L   CO2 28 22 - 32 mmol/L   Glucose, Bld 93 70 - 99 mg/dL   BUN 20 8 - 23 mg/dL   Creatinine, Ser 8.77 0.61 - 1.24 mg/dL   Calcium 8.4 (L) 8.9 - 10.3 mg/dL   Total Protein 6.7 6.5 - 8.1 g/dL   Albumin 3.0 (L) 3.5 - 5.0 g/dL   AST 32 15 - 41 U/L   ALT 41 0 - 44 U/L   Alkaline Phosphatase 81 38 - 126 U/L   Total Bilirubin 1.6 (H) 0.0 - 1.2 mg/dL   GFR, Estimated >39 >39 mL/min   Anion gap 8 5 - 15  CBC with Differential   Collection Time: 07/23/24  8:13 PM  Result Value Ref Range   WBC 11.6 (H) 4.0 - 10.5 K/uL   RBC 4.43 4.22 - 5.81 MIL/uL   Hemoglobin 14.9 13.0 - 17.0 g/dL   HCT 56.2 60.9 - 47.9 %   MCV 98.6 80.0 - 100.0 fL   MCH 33.6 26.0 - 34.0 pg   MCHC 34.1 30.0 - 36.0 g/dL   RDW 85.4 88.4 - 84.4 %   Platelets 195 150 - 400 K/uL   nRBC 0.0 0.0 - 0.2 %   Neutrophils Relative % 83 %   Neutro Abs 9.7 (H) 1.7 - 7.7 K/uL   Lymphocytes Relative 7 %   Lymphs Abs 0.8 0.7 - 4.0 K/uL   Monocytes Relative 8 %   Monocytes Absolute 0.9 0.1 - 1.0 K/uL   Eosinophils Relative 1 %   Eosinophils Absolute 0.1 0.0 - 0.5 K/uL   Basophils Relative 0 %   Basophils Absolute 0.0 0.0 - 0.1 K/uL   Immature Granulocytes 1 %   Abs Immature Granulocytes 0.06 0.00 - 0.07 K/uL  Protime-INR   Collection Time: 07/23/24  8:13 PM  Result Value Ref Range   Prothrombin Time 18.3 (H) 11.4 - 15.2 seconds   INR 1.4 (H) 0.8 - 1.2  I-Stat Lactic Acid, ED   Collection Time: 07/23/24  8:13 PM  Result Value Ref Range   Lactic Acid, Venous 0.8 0.5 - 1.9 mmol/L  Urinalysis, w/ Reflex to Culture (Infection Suspected) -Urine, Clean Catch   Collection Time: 07/23/24  8:16 PM  Result Value Ref Range   Specimen Source URINE, CATHETERIZED    Color, Urine AMBER (A) YELLOW   APPearance CLEAR CLEAR   Specific Gravity, Urine 1.020 1.005 - 1.030   pH 5.0 5.0 - 8.0   Glucose, UA >=500 (A) NEGATIVE mg/dL   Hgb urine dipstick MODERATE  (A) NEGATIVE   Bilirubin Urine NEGATIVE NEGATIVE   Ketones, ur NEGATIVE NEGATIVE mg/dL   Protein, ur 30 (A) NEGATIVE mg/dL   Nitrite NEGATIVE NEGATIVE   Leukocytes,Ua NEGATIVE NEGATIVE  RBC / HPF 11-20 0 - 5 RBC/hpf   WBC, UA 0-5 0 - 5 WBC/hpf   Bacteria, UA RARE (A) NONE SEEN   Squamous Epithelial / HPF 0-5 0 - 5 /HPF   Mucus PRESENT   Resp panel by RT-PCR (RSV, Flu A&B, Covid) Anterior Nasal Swab   Collection Time: 07/23/24  8:33 PM   Specimen: Anterior Nasal Swab  Result Value Ref Range   SARS Coronavirus 2 by RT PCR NEGATIVE NEGATIVE   Influenza A by PCR NEGATIVE NEGATIVE   Influenza B by PCR NEGATIVE NEGATIVE   Resp Syncytial Virus by PCR NEGATIVE NEGATIVE  Brain natriuretic peptide   Collection Time: 07/23/24  9:18 PM  Result Value Ref Range   B Natriuretic Peptide 569.7 (H) 0.0 - 100.0 pg/mL  Troponin I (High Sensitivity)   Collection Time: 07/23/24  9:18 PM  Result Value Ref Range   Troponin I (High Sensitivity) 155 (HH) <18 ng/L  I-Stat Lactic Acid, ED   Collection Time: 07/23/24 10:21 PM  Result Value Ref Range   Lactic Acid, Venous 0.6 0.5 - 1.9 mmol/L  CBC with Differential   Collection Time: 07/24/24  1:26 AM  Result Value Ref Range   WBC 11.6 (H) 4.0 - 10.5 K/uL   RBC 4.05 (L) 4.22 - 5.81 MIL/uL   Hemoglobin 13.6 13.0 - 17.0 g/dL   HCT 59.5 60.9 - 47.9 %   MCV 99.8 80.0 - 100.0 fL   MCH 33.6 26.0 - 34.0 pg   MCHC 33.7 30.0 - 36.0 g/dL   RDW 85.5 88.4 - 84.4 %   Platelets 171 150 - 400 K/uL   nRBC 0.0 0.0 - 0.2 %   Neutrophils Relative % 76 %   Neutro Abs 8.9 (H) 1.7 - 7.7 K/uL   Lymphocytes Relative 12 %   Lymphs Abs 1.4 0.7 - 4.0 K/uL   Monocytes Relative 9 %   Monocytes Absolute 1.0 0.1 - 1.0 K/uL   Eosinophils Relative 1 %   Eosinophils Absolute 0.2 0.0 - 0.5 K/uL   Basophils Relative 1 %   Basophils Absolute 0.1 0.0 - 0.1 K/uL   Immature Granulocytes 1 %   Abs Immature Granulocytes 0.07 0.00 - 0.07 K/uL  Comprehensive metabolic panel    Collection Time: 07/24/24  1:26 AM  Result Value Ref Range   Sodium 136 135 - 145 mmol/L   Potassium 3.2 (L) 3.5 - 5.1 mmol/L   Chloride 99 98 - 111 mmol/L   CO2 27 22 - 32 mmol/L   Glucose, Bld 99 70 - 99 mg/dL   BUN 19 8 - 23 mg/dL   Creatinine, Ser 8.88 0.61 - 1.24 mg/dL   Calcium 8.3 (L) 8.9 - 10.3 mg/dL   Total Protein 6.0 (L) 6.5 - 8.1 g/dL   Albumin 2.7 (L) 3.5 - 5.0 g/dL   AST 27 15 - 41 U/L   ALT 35 0 - 44 U/L   Alkaline Phosphatase 72 38 - 126 U/L   Total Bilirubin 1.6 (H) 0.0 - 1.2 mg/dL   GFR, Estimated >39 >39 mL/min   Anion gap 10 5 - 15  Troponin I (High Sensitivity)   Collection Time: 07/24/24  1:26 AM  Result Value Ref Range   Troponin I (High Sensitivity) 125 (HH) <18 ng/L  Protime-INR   Collection Time: 07/24/24  3:16 PM  Result Value Ref Range   Prothrombin Time 19.2 (H) 11.4 - 15.2 seconds   INR 1.5 (H) 0.8 - 1.2  Procalcitonin  Collection Time: 07/25/24 11:16 AM  Result Value Ref Range   Procalcitonin <0.10 ng/mL  CBC   Collection Time: 07/26/24  2:13 AM  Result Value Ref Range   WBC 7.1 4.0 - 10.5 K/uL   RBC 3.96 (L) 4.22 - 5.81 MIL/uL   Hemoglobin 13.2 13.0 - 17.0 g/dL   HCT 61.8 (L) 60.9 - 47.9 %   MCV 96.2 80.0 - 100.0 fL   MCH 33.3 26.0 - 34.0 pg   MCHC 34.6 30.0 - 36.0 g/dL   RDW 85.8 88.4 - 84.4 %   Platelets 190 150 - 400 K/uL   nRBC 0.0 0.0 - 0.2 %  Magnesium   Collection Time: 07/26/24  2:13 AM  Result Value Ref Range   Magnesium 1.9 1.7 - 2.4 mg/dL  Basic metabolic panel with GFR   Collection Time: 07/26/24  2:13 AM  Result Value Ref Range   Sodium 138 135 - 145 mmol/L   Potassium 3.6 3.5 - 5.1 mmol/L   Chloride 100 98 - 111 mmol/L   CO2 26 22 - 32 mmol/L   Glucose, Bld 132 (H) 70 - 99 mg/dL   BUN 19 8 - 23 mg/dL   Creatinine, Ser 9.17 0.61 - 1.24 mg/dL   Calcium 8.3 (L) 8.9 - 10.3 mg/dL   GFR, Estimated >39 >39 mL/min   Anion gap 12 5 - 15  Phosphorus   Collection Time: 07/26/24  2:13 AM  Result Value Ref Range    Phosphorus 3.1 2.5 - 4.6 mg/dL      Assessment & Plan:   Problem List Items Addressed This Visit       Cardiovascular and Mediastinum   Chronic combined systolic and diastolic heart failure (HCC) (Chronic)   Chronic, stable.  Euvolemic today. Continue current medication regimen as ordered by cardiology and collaboration.  Recent notes reviewed. Recommend: - Reminded to call for an overnight weight gain of >2 pounds or a weekly weight gain of >5 pounds - not adding salt to food and read food labels. Reviewed the importance of keeping daily sodium intake to 2000mg  daily.  - Avoid Ibuprofen products.      Relevant Orders   TSH   Atypical atrial flutter (HCC)   Chronic, ongoing.  Followed by cardiology. Ablation performed on 07/21/24. Continue current collaboration and medication regimen as ordered.  Recent notes reviewed. Regular rhythm noted on exam today.      Relevant Orders   Comprehensive metabolic panel with GFR     Respiratory   Aspiration pneumonia (HCC) - Primary   Acute, post ablation on 07/21/24. Overall improving, but still some weakness and occasional SOB reported. A few scattered rales BLL on exam. Will sent in 5 more days Augmentin  for coverage = 10 full days total. Continue Mucinex  at home as needed. Will plan on repeat imaging in 3 weeks, discussed with him and he is aware where to obtain this. To monitor O2 sats at home, goal >90% on RA, and temperature. If any fevers or consistent low sats present to immediately go to ER or alert PCP.       Relevant Medications   amoxicillin -clavulanate (AUGMENTIN ) 875-125 MG tablet   Other Relevant Orders   CBC with Differential/Platelet   DG Chest 2 View     Other   Presence of cardiac pacemaker (Chronic)   Followed by cardiology who performs checks.  Continue this collaboration.  Recent notes reviewed.      Sepsis (HCC)   Acute, post ablation on  07/21/24 with aspiration PNA. Overall improving, but still some weakness and  occasional SOB reported. A few scattered rales BLL on exam. Will sent in 5 more days Augmentin  for coverage = 10 full days total. Continue Mucinex  at home as needed. Will plan on repeat imaging in 3 weeks, discussed with him and he is aware where to obtain this. To monitor O2 sats at home, goal >90% on RA, and temperature. If any fevers or consistent low sats present to immediately go to ER or alert PCP.       Relevant Orders   CBC with Differential/Platelet   Comprehensive metabolic panel with GFR   Mixed hyperlipidemia   Chronic, ongoing.  Remain off statin therapy, was discontinued by cardiology in past. Continue collaboration with cardiology. Lipid panel today.      Relevant Orders   Comprehensive metabolic panel with GFR   Lipid Panel w/o Chol/HDL Ratio   Iron deficiency anemia due to chronic blood loss   Noted on past labs, recheck levels today and start supplement as needed.      Relevant Orders   CBC with Differential/Platelet   Ferritin   Iron   Other Visit Diagnoses       Need for hepatitis C screening test       Hep C screening due and obtained today on labs.   Relevant Orders   Hepatitis C antibody        Follow up plan: Return in about 3 weeks (around 08/30/2024) for Pneumonia.

## 2024-08-09 NOTE — Assessment & Plan Note (Signed)
 Noted on past labs, recheck levels today and start supplement as needed.

## 2024-08-09 NOTE — Assessment & Plan Note (Signed)
 Acute, post ablation on 07/21/24 with aspiration PNA. Overall improving, but still some weakness and occasional SOB reported. A few scattered rales BLL on exam. Will sent in 5 more days Augmentin  for coverage = 10 full days total. Continue Mucinex  at home as needed. Will plan on repeat imaging in 3 weeks, discussed with him and he is aware where to obtain this. To monitor O2 sats at home, goal >90% on RA, and temperature. If any fevers or consistent low sats present to immediately go to ER or alert PCP.

## 2024-08-09 NOTE — Assessment & Plan Note (Signed)
 Followed by cardiology who performs checks.  Continue this collaboration.  Recent notes reviewed.

## 2024-08-10 ENCOUNTER — Ambulatory Visit: Payer: Self-pay | Admitting: Nurse Practitioner

## 2024-08-10 DIAGNOSIS — E032 Hypothyroidism due to medicaments and other exogenous substances: Secondary | ICD-10-CM

## 2024-08-10 LAB — COMPREHENSIVE METABOLIC PANEL WITH GFR
ALT: 21 IU/L (ref 0–44)
AST: 21 IU/L (ref 0–40)
Albumin: 4.1 g/dL (ref 3.8–4.8)
Alkaline Phosphatase: 112 IU/L (ref 47–123)
BUN/Creatinine Ratio: 13 (ref 10–24)
BUN: 13 mg/dL (ref 8–27)
Bilirubin Total: 0.9 mg/dL (ref 0.0–1.2)
CO2: 28 mmol/L (ref 20–29)
Calcium: 9.7 mg/dL (ref 8.6–10.2)
Chloride: 99 mmol/L (ref 96–106)
Creatinine, Ser: 0.97 mg/dL (ref 0.76–1.27)
Globulin, Total: 2.8 g/dL (ref 1.5–4.5)
Glucose: 92 mg/dL (ref 70–99)
Potassium: 4.7 mmol/L (ref 3.5–5.2)
Sodium: 139 mmol/L (ref 134–144)
Total Protein: 6.9 g/dL (ref 6.0–8.5)
eGFR: 79 mL/min/1.73 (ref >59)

## 2024-08-10 LAB — CBC WITH DIFFERENTIAL/PLATELET
Basophils Absolute: 0.1 x10E3/uL (ref 0.0–0.2)
Basos: 1 %
EOS (ABSOLUTE): 0.7 x10E3/uL — ABNORMAL HIGH (ref 0.0–0.4)
Eos: 12 %
Hematocrit: 48.9 % (ref 37.5–51.0)
Hemoglobin: 16.4 g/dL (ref 13.0–17.7)
Immature Grans (Abs): 0 x10E3/uL (ref 0.0–0.1)
Immature Granulocytes: 0 %
Lymphocytes Absolute: 1.1 x10E3/uL (ref 0.7–3.1)
Lymphs: 19 %
MCH: 34.2 pg — ABNORMAL HIGH (ref 26.6–33.0)
MCHC: 33.5 g/dL (ref 31.5–35.7)
MCV: 102 fL — ABNORMAL HIGH (ref 79–97)
Monocytes Absolute: 0.4 x10E3/uL (ref 0.1–0.9)
Monocytes: 7 %
Neutrophils Absolute: 3.3 x10E3/uL (ref 1.4–7.0)
Neutrophils: 61 %
Platelets: 325 x10E3/uL (ref 150–450)
RBC: 4.8 x10E6/uL (ref 4.14–5.80)
RDW: 13.2 % (ref 11.6–15.4)
WBC: 5.6 x10E3/uL (ref 3.4–10.8)

## 2024-08-10 LAB — LIPID PANEL W/O CHOL/HDL RATIO
Cholesterol, Total: 209 mg/dL — ABNORMAL HIGH (ref 100–199)
HDL: 47 mg/dL (ref >39)
LDL Chol Calc (NIH): 148 mg/dL — ABNORMAL HIGH (ref 0–99)
Triglycerides: 77 mg/dL (ref 0–149)
VLDL Cholesterol Cal: 14 mg/dL (ref 5–40)

## 2024-08-10 LAB — IRON: Iron: 146 ug/dL (ref 38–169)

## 2024-08-10 LAB — HEPATITIS C ANTIBODY: Hep C Virus Ab: NONREACTIVE

## 2024-08-10 LAB — TSH: TSH: 7.51 u[IU]/mL — ABNORMAL HIGH (ref 0.450–4.500)

## 2024-08-10 LAB — FERRITIN: Ferritin: 110 ng/mL (ref 30–400)

## 2024-08-10 MED ORDER — LEVOTHYROXINE SODIUM 25 MCG PO TABS: 25.0000 ug | ORAL_TABLET | Freq: Every day | ORAL | 3 refills | Status: AC

## 2024-08-10 NOTE — Progress Notes (Signed)
 Contacted via MyChart but please call to ensure he has obtained results + will need lab only visit in 6 weeks scheduled.  Good morning Signe, your labs have returned: - CBC showing no infection or anemia. Good news!! - Lipid panel showing elevations, but I recall you telling me your heart doctor took you off statin therapy, so we will monitor this closely. - TSH, thyroid  level, has trended up more. Due to you being on Amiodarone , which can affect thyroid , I feel we will need to start some Levothyroxine to help balance this for now. With level being elevated this is called hypothyroid or sluggish thyroid . This can make you feel very tired. I will send in a low dose of Levothyroxine to start daily, take 30 minutes before any of your other medication in morning and before eating. Then we will recheck levels outpatient in 6 weeks. - Remainder of labs are stable. Any questions? Keep being awesome!!  Thank you for allowing me to participate in your care.  I appreciate you. Kindest regards, Leighann Amadon

## 2024-08-18 ENCOUNTER — Ambulatory Visit (HOSPITAL_COMMUNITY)
Admission: RE | Admit: 2024-08-18 | Discharge: 2024-08-18 | Attending: Physician Assistant | Admitting: Physician Assistant

## 2024-08-18 ENCOUNTER — Telehealth: Payer: Self-pay | Admitting: Cardiology

## 2024-08-18 VITALS — BP 102/60 | HR 60 | Ht 70.98 in | Wt 221.2 lb

## 2024-08-18 DIAGNOSIS — I4819 Other persistent atrial fibrillation: Secondary | ICD-10-CM | POA: Diagnosis not present

## 2024-08-18 DIAGNOSIS — I4891 Unspecified atrial fibrillation: Secondary | ICD-10-CM

## 2024-08-18 DIAGNOSIS — Z79899 Other long term (current) drug therapy: Secondary | ICD-10-CM | POA: Diagnosis not present

## 2024-08-18 DIAGNOSIS — Z5181 Encounter for therapeutic drug level monitoring: Secondary | ICD-10-CM

## 2024-08-18 DIAGNOSIS — D6869 Other thrombophilia: Secondary | ICD-10-CM

## 2024-08-18 NOTE — Telephone Encounter (Signed)
 Good afternoon, Pt came to drop off this denial paperwork off from [facility] for the CT he had on 11.22.2025. The approval start date was on 11.23.2025. I ran 3 copies.   Location: Dr. Inocencio mailbox.

## 2024-08-18 NOTE — Progress Notes (Signed)
 Primary Care Physician: Cannady, [name], NP Referring Physician: Dr. Inocencio Cardiology: Dr. Francyne Lynwood Shane [name] is a 78 y.o. male with a h/o PPM,  paroxysmal afib, s/p ablation in 2018 and being seen in f/u in afib clinic for return to atrial flutter. He was seen in the office by Dr. [name] and he attempted to pace him out which was unsuccessful. On f/u here if he was still in out of rhythm, cardioversion was to be scheduled. Ekg shows atrial flutter at 118 bpm. .  He continues on eliquis  5 mg bid for CHA2DS2VASc score of 4. He is mildly symptomatic.  F/u in afib clinic, 7/6, one week s/p successful  cardioversion.  Unfortunately, he is in atrial flutter at a rate around 150 bpm and mildly  hypotensive. Pt feels slightly lightheaded and mild fatgiue. He has felt ok for the last several days and has not checked his HR. Dr. Lucie in and attempted overdrive pacing, see his note above. Unfortunately, he did not return  to SR but to a slower afib at a rate of 90 bpm.  Bp came up into the 90's systolic with pt feeling better. Denies being lightheaded now. He had a prior afib  ablation 04/2017. After discussion with Dr. [name]. He will be started on amiodarone  since BP is a concern.   F/u 7/8. He remains in a flutter by EKG at 102 bpm, but by pulse ox HR is in the 80's to low 90's. He had an episode of sweating after his shower this am but sat under the fan to cool off. He is fatigued. He continues on amiodarone  200 mg bid. He wanted to go to the beach today but I would feel better if he would  wait a week.   F/u 03/28/20. He continues in rate controlled afib. Still loading on amiodarone . He will be on amiodarone  x 4 weeks on 8/2 and will eligible for cardioversion after that date. He wishes to  go back to the beach next  week and wants to set this up for the week of August 9 th.   F/u in afib clinic 04/18/21. Since I last saw pt a year ago, he has had more issues with afib and one month ago had a repeat  ablation. He is here for one month f/u. Unfortunately, he is a rate controlled atrial flutter. He has not seen any improvement from this ablation although he is able to do his activities without any fatigue or unusual fatigue. I discussed with Dr. Inocencio and he would like him to increase amiodarone  and see back in the next 2 weeks to see if this can convert pt. I will also ask the pt to send a device report to see if he is persistent or paroxysmal.   Follow up in the AF clinic 04/29/21. Patient remains in rate controlled atrial flutter. Patient states that he feels about the same with no change in his energy level. He denies any bleeding issues on anticoagulation.   F/u in afib clinic, 07/30/21. He was set up for cardioversion by Dr. [name] after he unsuccessfully  tried to pace him out of flutter. The cardioversion was cancelled pre procedure as Joey with [employer] was able to pace him out. He remains in av paced rhythm today.   Follow up 08/18/24. Patient returns for follow up for atrial fibrillation and amiodarone  monitoring. He is s/p repeat afib and flutter ablation 07/21/24. He was admitted two days after his  ablation for aspiration PNA. He is feeling better now but his breathing is not quite back to baseline. He denies any interim symptoms of afib. His groin sites are well healed. No bleeding issues on anticoagulation.   Today, he  denies symptoms of palpitations, chest pain, orthopnea, PND, lower extremity edema, dizziness, presyncope, syncope, bleeding, or neurologic sequela. The patient is tolerating medications without difficulties and is otherwise without complaint today.    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 30 tablet 2   amiodarone  (PACERONE ) 400 MG tablet Take 1 tablet (400 mg total) by mouth daily. 90 tablet 3   apixaban  (ELIQUIS ) 5 MG TABS tablet Take 1 tablet (5 mg total) by mouth 2 (two) times daily. 180 tablet 2   Apoaequorin (PREVAGEN PO) Take 1 tablet by mouth daily.     Ascorbic Acid  (VITAMIN C ) 1000 MG tablet Take 1,000 mg by mouth daily.     Cholecalciferol  (VITAMIN D -3 PO) Take 2,500 Units by mouth at bedtime.     empagliflozin  (JARDIANCE ) 10 MG TABS tablet Take 1 tablet (10 mg total) by mouth daily before breakfast. 90 tablet 3   erythromycin ophthalmic ointment Place 1 Application into both eyes as needed.     ferrous sulfate  325 (65 FE) MG EC tablet Take 1 tablet (325 mg total) by mouth 2 (two) times daily. 180 tablet 3   furosemide  (LASIX ) 40 MG tablet Take 1 tablet (40 mg total) by mouth daily. 90 tablet 3   furosemide  (LASIX ) 80 MG tablet Take 1 tablet (80 mg total) by mouth daily. 90 tablet 3   levothyroxine  (SYNTHROID ) 25 MCG tablet Take 1 tablet (25 mcg total) by mouth daily. 60 tablet 3   Multiple Vitamin (MULTIVITAMIN WITH MINERALS) TABS tablet Take 1 tablet by mouth daily. Senior     mupirocin ointment (BACTROBAN) 2 % Apply 1 Application topically as needed.     Omega-3 Fatty Acids (FISH OIL) 1200 MG CAPS Take 1,200 mg by mouth 2 (two) times daily.      potassium chloride  SA (KLOR-CON  M) 20 MEQ tablet Take 1 tablet (20 mEq total) by mouth daily. 90 tablet 3   pyridOXINE (VITAMIN B-6) 100 MG tablet Take 100 mg by mouth daily.     silver  sulfADIAZINE  (SILVADENE ) 1 % cream Apply 1 Application topically daily. 50 g 0   testosterone  cypionate (DEPOTESTOSTERONE CYPIONATE) 200 MG/ML  injection Inject 200 mg into the muscle once a week.     triamcinolone cream (KENALOG) 0.1 % Apply 1 Application topically as needed.     vitamin B-12 (CYANOCOBALAMIN ) 1000 MCG tablet Take 1,000 mcg by mouth daily.     Wheat Dextrin (BENEFIBER DRINK MIX PO) Take 30 mLs by mouth every evening. 2 tablespoons a day     zinc gluconate 50 MG tablet  Take 50 mg by mouth daily.     No current facility-administered medications for this encounter.    ROS- All systems are reviewed and negative except as per the HPI above  Physical Exam: Vitals:   08/18/24 1530  BP: 102/60  Pulse: 60  Weight: 100.3 kg  Height: 5' 10.98 (1.803 m)     Wt Readings from Last 3 Encounters:  08/18/24 100.3 kg  08/09/24 99.2 kg  07/24/24 103 kg    GEN: Well nourished, well developed in no acute distress CARDIAC: Regular rate and rhythm, no murmurs, rubs, gallops RESPIRATORY:  Clear to auscultation without rales, wheezing or rhonchi  ABDOMEN: Soft, non-tender, non-distended EXTREMITIES:  No edema; No deformity     EKG Interpretation Date/Time:  Thursday August 18 2024 15:56:15 EST Ventricular Rate:  60 PR Interval:  148 QRS Duration:  218 QT Interval:  544 QTC Calculation: 544 R Axis:   5  Text Interpretation: AV dual-paced rhythm Abnormal ECG When compared with ECG of 23-Jul-2024 20:27, No significant change was found Confirmed by Naomi Castrogiovanni ([id number]) on 08/18/2024 3:56:57 PM    CHA2DS2-VASc Score = 3  The patient's score is based upon: CHF History: 1 HTN History: 0 Diabetes History: 0 Stroke History: 0 Vascular Disease History: 0 Age Score: 2 Gender Score: 0       ASSESSMENT AND PLAN: Persistent Atrial Fibrillation/atrial flutter/atrial tachycardia (ICD10:  I48.19) The patient's CHA2DS2-VASc score is 3, indicating a 3.2% annual risk of stroke.   S/p ablation 04/30/17, 03/01/21, and 07/21/24 (afib and flutter) Patient appears to be maintaining SR Continue amiodarone  400 mg daily for now,  can consider reducing at follow up.  Continue Eliquis  5 mg BID with no missed doses for 3 months post ablation.   Secondary Hypercoagulable State (ICD10:  D68.69) The patient is at significant risk for stroke/thromboembolism based upon his CHA2DS2-VASc Score of 3.  Continue Apixaban  (Eliquis ). No bleeding issues.   High Risk Medication Monitoring (ICD 10: U5195107) Patient requires ongoing monitoring for anti-arrhythmic medication which has the potential to cause life threatening arrhythmias. Intervals on ECG acceptable for amiodarone  monitoring.   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    Daril Kicks PA-C [facility] [address] [place], [place] [address] [phone]

## 2024-09-05 ENCOUNTER — Ambulatory Visit: Payer: Self-pay

## 2024-09-05 ENCOUNTER — Telehealth: Payer: Self-pay | Admitting: Cardiology

## 2024-09-05 DIAGNOSIS — I4891 Unspecified atrial fibrillation: Secondary | ICD-10-CM

## 2024-09-05 NOTE — Telephone Encounter (Signed)
"  See telephone note from today.   "

## 2024-09-05 NOTE — Telephone Encounter (Signed)
 Good morning, pt came to drop off another denial letter from his insurance. Pt would like some assistance with denial. Pt call back number [phone].  Location; Dr. Walton mailbox.

## 2024-09-05 NOTE — Telephone Encounter (Signed)
 Pt made aware I would forward this to our billing dept to address.  Unsure if this is ablation CT denial or CT done at hospital afterwards secondary to PNA. Pt aware I would ask billing dept to let me know if denial paperwork is needed (pt dropped it at office this morning) and I can get the paperwork to them. Pt appreciates the call and understands billing will update him once they have taken care of this.

## 2024-09-06 ENCOUNTER — Ambulatory Visit
Admission: RE | Admit: 2024-09-06 | Discharge: 2024-09-06 | Disposition: A | Discharge status: Home / Self Care | Source: Ambulatory Visit | Attending: Nurse Practitioner | Admitting: Nurse Practitioner

## 2024-09-06 ENCOUNTER — Ambulatory Visit: Payer: Self-pay | Admitting: Cardiovascular Disease

## 2024-09-06 DIAGNOSIS — J690 Pneumonitis due to inhalation of food and vomit: Secondary | ICD-10-CM | POA: Diagnosis present

## 2024-09-06 LAB — CUP PACEART REMOTE DEVICE CHECK
Battery Remaining Longevity: 108 mo
Battery Remaining Percentage: 100 %
Brady Statistic RA Percent Paced: 100 %
Brady Statistic RV Percent Paced: 100 %
Date Time Interrogation Session: 20260105004000
Implantable Lead Connection Status: 753985
Implantable Lead Connection Status: 753985
Implantable Lead Implant Date: 20041025
Implantable Lead Implant Date: 20041025
Implantable Lead Location: 753859
Implantable Lead Location: 753860
Implantable Lead Model: 4457
Implantable Lead Model: 4480
Implantable Lead Serial Number: 338209
Implantable Lead Serial Number: 424134
Implantable Pulse Generator Implant Date: 20211206
Lead Channel Impedance Value: 522 Ohm
Lead Channel Impedance Value: 566 Ohm
Lead Channel Setting Pacing Amplitude: 2.5 V
Lead Channel Setting Pacing Amplitude: 2.5 V
Lead Channel Setting Pacing Pulse Width: 0.4 ms
Lead Channel Setting Sensing Sensitivity: 3 mV
Pulse Gen Serial Number: 955727
Zone Setting Status: 755011

## 2024-09-07 NOTE — Telephone Encounter (Signed)
 I have inter-office mailed, to billing dept,  both denial letters that pt dropped off.

## 2024-09-08 NOTE — Progress Notes (Signed)
 Remote PPM Transmission

## 2024-09-10 NOTE — Patient Instructions (Signed)
 Be Involved in Caring For Your Health:  Taking Medications When medications are taken as directed, they can greatly improve your health. But if they are not taken as prescribed, they may not work. In some cases, not taking them correctly can be harmful. To help ensure your treatment remains effective and safe, understand your medications and how to take them. Bring your medications to each visit for review by your provider.  Your lab results, notes, and after visit summary will be available on My Chart. We strongly encourage you to use this feature. If lab results are abnormal the clinic will contact you with the appropriate steps. If the clinic does not contact you assume the results are satisfactory. You can always view your results on My Chart. If you have questions regarding your health or results, please contact the clinic during office hours. You can also ask questions on My Chart.  We at [facility] are grateful that you chose [place] to provide your care. We strive to provide evidence-based and compassionate care and are always looking for feedback. If you get a survey from the clinic please complete this so we can hear your opinions.  Community-Acquired Pneumonia, Adult Pneumonia is an infection of the lungs. It causes irritation and swelling in the airways of the lungs. Mucus and fluid may also build up inside the airways. This may cause coughing and trouble breathing. One type of pneumonia can happen while you are in a hospital. A different type can happen when you are not in a hospital (community-acquired pneumonia). What are the causes?  This condition is caused by germs (viruses, bacteria, or fungi). Some types of germs can spread from person to person. Pneumonia is not thought to spread from person to person. What increases the risk? You have a long-term (chronic) disease, such as: Disease of the lungs. This may be chronic obstructive pulmonary disease (COPD) or asthma. Heart  failure. Cystic fibrosis. Diabetes. Kidney disease. Sickle cell disease. HIV. You have other health problems, such as: Your body's defense system (immune system) is weak. A condition that may cause you to breathe in fluids from your mouth and nose. You had your spleen taken out. You do not take good care of your teeth and mouth (poor dental hygiene). You use or have used tobacco products. You go where the germs that cause this illness are common. You are older than 81 years of age. What are the signs or symptoms? A cough. A fever. Sweating or chills. Chest pain, often when you breathe deeply or cough. Breathing problems, such as: Fast breathing. Trouble breathing. Shortness of breath. Feeling tired (fatigued). Muscle aches. How is this treated? Treatment for this condition depends on many things, such as: The cause of your illness. Your medicines. Your other health problems. Most adults can be treated at home. Sometimes, treatment must happen in a hospital. Treatment may include medicines to kill germs. Medicines may depend on which germ caused your illness. Very bad pneumonia is rare. If you get it, you may: Have a machine to help you breathe. Have fluid taken away from around your lungs. Follow these instructions at home: Medicines Take over-the-counter and prescription medicines only as told by your doctor. Take cough medicine only if you are losing sleep. Cough medicine can keep your body from taking mucus away from your lungs. If you were prescribed antibiotics, take them as told by your doctor. Do not stop taking them even if you start to feel better. Lifestyle  Do not smoke or use any products that contain nicotine or tobacco. If you need help quitting, ask your doctor. Do not drink alcohol. Eat a healthy diet. This includes a lot of vegetables, fruits, whole grains, low-fat dairy products, and low-fat (lean) protein. General instructions  Rest a lot. Sleep  for at least 8 hours each night. Sleep with your head and neck raised. Put a few pillows under your head or sleep in a reclining chair. Return to your normal activities as told by your doctor. Ask your doctor what activities are safe for you. Drink enough fluid to keep your pee (urine) pale yellow. If your throat is sore, gargle with a mixture of salt and water 3-4 times a day or as needed. To make salt water, completely dissolve -1 tsp (3-6 g) of salt in 1 cup (237 mL) of warm water. Keep all follow-up visits. How is this prevented? Getting the pneumonia shot (vaccine). These shots have different types and schedules. Ask your doctor what works best for you. Think about getting this shot if: You are older than 81 years of age. You are 25-63 years of age and: You are being treated for cancer. You have long-term lung disease. You have other problems that affect your body's defense system. Ask your doctor if you have one of these. Getting your flu shot every year. Ask your doctor which type of shot is best for you. Going to the dentist as often as told. Washing your hands often with soap and water for at least 20 seconds. If you cannot use soap and water, use hand sanitizer. Contact a doctor if: You have a fever. You lose sleep because your cough medicine does not help. Get help right away if: You are short of breath and this gets worse. You have more chest pain. Your sickness gets worse. This is very serious if: You are an older adult. Your body's defense system is weak. You cough up blood. These symptoms may be an emergency. Get help right away. Call 911. Do not wait to see if the symptoms will go away. Do not drive yourself to the hospital. Summary Pneumonia is an infection of the lungs. Community-acquired pneumonia affects people who have not been in the hospital. Certain germs can cause this infection. This condition may be treated with medicines that kill germs. For very bad  pneumonia, you may need a hospital stay and treatment to help with breathing. This information is not intended to replace advice given to you by your health care provider. Make sure you discuss any questions you have with your health care provider. Document Revised: 10/16/2021 Document Reviewed: 10/16/2021 Elsevier Patient Education  2024 [employer].

## 2024-09-14 ENCOUNTER — Encounter: Payer: Self-pay | Admitting: Nurse Practitioner

## 2024-09-14 ENCOUNTER — Ambulatory Visit (INDEPENDENT_AMBULATORY_CARE_PROVIDER_SITE_OTHER): Admitting: Nurse Practitioner

## 2024-09-14 VITALS — BP 97/62 | HR 60 | Temp 97.4°F | Resp 17 | Ht 70.98 in | Wt 224.0 lb

## 2024-09-14 DIAGNOSIS — J690 Pneumonitis due to inhalation of food and vomit: Secondary | ICD-10-CM

## 2024-09-14 NOTE — Assessment & Plan Note (Addendum)
 Acute, post ablation on 07/21/24. Overall much improved at this time and CXR from yesterday showed no further pneumonia. He will return to office if any return of symptoms.

## 2024-09-14 NOTE — Progress Notes (Signed)
 "  BP 97/62 (BP Location: Left Arm, Patient Position: Sitting, Cuff Size: Normal)   Pulse 60   Temp (!) 97.4 F (36.3 C) (Oral)   Resp 17   Ht 5' 10.98 (1.803 m)   Wt 224 lb (101.6 kg)   SpO2 97%   BMI 31.26 kg/m    Subjective:    Patient ID: Shane [name], male    DOB: [birth year], 79 y.o.   MRN: [id number]  HPI: Shane [name] is a 79 y.o. male  Chief Complaint  Patient presents with   Follow-up    Follow up for pneumonia   PNEUMONIA Follow-up today for pneumonia which presented on 07/23/24. He reports overall feeling better. Current CXR shows no pneumonia. Fever: no Cough: no Shortness of breath: no Wheezing: no Chest pain: no Chest tightness: no Chest congestion: no Nasal congestion: no Runny nose: no Post nasal drip: no Sneezing: no Sore throat: no Swollen glands: no Sinus pressure: no Headache: no Face pain: no Toothache: no Ear pain: none Ear pressure: none Eyes red/itching:no Eye drainage/crusting: no  Vomiting: no Rash: no Fatigue: no Relief with OTC cold/cough medications: yes  Treatments attempted:     Relevant past medical, surgical, family and social history reviewed and updated as indicated. Interim medical history since our last visit reviewed. Allergies and medications reviewed and updated.  Review of Systems  Constitutional:  Negative for activity change, diaphoresis, fatigue and fever.  Respiratory:  Negative for cough, chest tightness, shortness of breath and wheezing.   Cardiovascular:  Negative for chest pain, palpitations and leg swelling.  Gastrointestinal: Negative.   Neurological: Negative.   Psychiatric/Behavioral: Negative.      Per HPI unless specifically indicated above     Objective:    BP 97/62 (BP Location: Left Arm, Patient Position: Sitting, Cuff Size: Normal)   Pulse 60   Temp (!) 97.4 F (36.3 C) (Oral)   Resp 17   Ht 5' 10.98 (1.803 m)   Wt 224 lb (101.6 kg)   SpO2 97%   BMI 31.26 kg/m   Wt Readings  from Last 3 Encounters:  09/14/24 224 lb (101.6 kg)  08/18/24 221 lb 3.2 oz (100.3 kg)  08/09/24 218 lb 12.8 oz (99.2 kg)    Physical Exam Vitals and nursing note reviewed.  Constitutional:      General: He is awake. He is not in acute distress.    Appearance: He is well-developed and well-groomed. He is obese. He is not ill-appearing or toxic-appearing.  HENT:     Head: Normocephalic.     Right Ear: Hearing and external ear normal.     Left Ear: Hearing and external ear normal.  Eyes:     General: Lids are normal.     Extraocular Movements: Extraocular movements intact.     Conjunctiva/sclera: Conjunctivae normal.  Neck:     Thyroid : No thyromegaly.     Vascular: No carotid bruit.  Cardiovascular:     Rate and Rhythm: Normal rate and regular rhythm.     Heart sounds: Normal heart sounds. No murmur heard.    No gallop.  Pulmonary:     Effort: No accessory muscle usage or respiratory distress.     Breath sounds: Normal breath sounds. No decreased breath sounds, wheezing or rales.  Abdominal:     General: Bowel sounds are normal. There is no distension.     Palpations: Abdomen is soft.     Tenderness: There is no abdominal tenderness.  Musculoskeletal:  Cervical back: Full passive range of motion without pain.     Right lower leg: No edema.     Left lower leg: No edema.  Lymphadenopathy:     Cervical: No cervical adenopathy.  Skin:    General: Skin is warm.     Capillary Refill: Capillary refill takes less than 2 seconds.  Neurological:     Mental Status: He is alert and oriented to person, place, and time.     Deep Tendon Reflexes: Reflexes are normal and symmetric.     Reflex Scores:      Brachioradialis reflexes are 2+ on the right side and 2+ on the left side.      Patellar reflexes are 2+ on the right side and 2+ on the left side. Psychiatric:        Attention and Perception: Attention normal.        Mood and Affect: Mood normal.        Speech: Speech normal.         Behavior: Behavior normal. Behavior is cooperative.        Thought Content: Thought content normal.    Results for orders placed or performed in visit on 09/05/24  CUP PACEART REMOTE DEVICE CHECK   Collection Time: 09/05/24 12:40 AM  Result Value Ref Range   Date Time Interrogation Session [id number]    Pulse Generator Manufacturer BOST    Pulse Gen Model L331 ACCOLADE MRI EL    Pulse Gen Serial Number [id number]    Clinic Name [facility]    Implantable Pulse Generator Type Implantable Pulse Generator    Implantable Pulse Generator Implant Date [id number]    Implantable Lead Manufacturer BOST    Implantable Lead Model 4457 Fineline II Sterox    Implantable Lead Serial Number [id number]    Implantable Lead Implant Date 79958974    Implantable Lead Location Detail 1 [employer]    Implantable Lead Location [id number]    Implantable Lead Connection Status [id number]    Implantable Lead Manufacturer BOST    Implantable Lead Model 4480 Fineline II Sterox AJ    Implantable Lead Serial Number [id number]    Implantable Lead Implant Date 79958974    Implantable Lead Location Detail 1 APPENDAGE    Implantable Lead Location [id number]    Implantable Lead Connection Status [id number]    Lead Channel Setting Sensing Sensitivity 3.0 mV   Lead Channel Setting Sensing Adaptation Mode Fixed Pacing    Lead Channel Setting Pacing Amplitude 2.5 V   Lead Channel Setting Pacing Pulse Width 0.4 ms   Lead Channel Setting Pacing Amplitude 2.5 V   Zone Setting Status [id number]    Lead Channel Impedance Value 522 ohm   Lead Channel Impedance Value 566 ohm   Battery Status BOS    Battery Remaining Longevity 108 mo   Battery Remaining Percentage 100 %   Brady Statistic RA Percent Paced 100 %   Brady Statistic RV Percent Paced 100 %      Assessment & Plan:   Problem List Items Addressed This Visit   None    Follow up plan: No follow-ups on file.      "

## 2024-09-14 NOTE — Progress Notes (Signed)
 Contacted via MyChart  No further pneumonia noted!!  [employer]!!

## 2024-09-20 ENCOUNTER — Other Ambulatory Visit

## 2024-09-22 ENCOUNTER — Ambulatory Visit

## 2024-09-26 ENCOUNTER — Other Ambulatory Visit: Payer: Self-pay | Admitting: Cardiovascular Disease

## 2024-09-30 MED ORDER — TAMSULOSIN HCL 0.4 MG PO CAPS: 0.4000 mg | ORAL_CAPSULE | Freq: Every day | ORAL | 2 refills | Status: AC

## 2024-09-30 NOTE — Addendum Note (Signed)
 Addended by: BLUFORD, Hipolito Martinezlopez L on: 09/30/2024 04:30 PM   Modules accepted: Orders

## 2024-09-30 NOTE — Telephone Encounter (Addendum)
 Pt of Dr. Francyne. Does Dr. Francyne want to refill this Flomax ? Please advise.

## 2024-09-30 NOTE — Telephone Encounter (Signed)
 Refill please.  I am the one who started him on this medication.

## 2024-10-21 ENCOUNTER — Ambulatory Visit (HOSPITAL_COMMUNITY): Admitting: Physician Assistant

## 2024-11-17 ENCOUNTER — Ambulatory Visit: Admitting: Cardiovascular Disease

## 2024-11-21 ENCOUNTER — Ambulatory Visit: Admitting: Nurse Practitioner
